# Patient Record
Sex: Male | Born: 1960
Health system: Southern US, Community
[De-identification: ages and names within clinical notes are randomized; demographics above are authoritative.]

## PROBLEM LIST (undated history)

## (undated) ENCOUNTER — Emergency Department: Discharge status: Absent without leave | Payer: Medicare Other

## (undated) DIAGNOSIS — Z789 Other specified health status: Secondary | ICD-10-CM

## (undated) DIAGNOSIS — M2550 Pain in unspecified joint: Secondary | ICD-10-CM

## (undated) DIAGNOSIS — I872 Venous insufficiency (chronic) (peripheral): Secondary | ICD-10-CM

## (undated) DIAGNOSIS — F1910 Other psychoactive substance abuse, uncomplicated: Secondary | ICD-10-CM

## (undated) DIAGNOSIS — F1020 Alcohol dependence, uncomplicated: Secondary | ICD-10-CM

## (undated) DIAGNOSIS — S065X9A Traumatic subdural hemorrhage with loss of consciousness of unspecified duration, initial encounter: Secondary | ICD-10-CM

## (undated) DIAGNOSIS — I10 Essential (primary) hypertension: Secondary | ICD-10-CM

## (undated) DIAGNOSIS — R519 Headache, unspecified: Secondary | ICD-10-CM

## (undated) DIAGNOSIS — G8929 Other chronic pain: Secondary | ICD-10-CM

## (undated) DIAGNOSIS — Z87442 Personal history of urinary calculi: Secondary | ICD-10-CM

## (undated) DIAGNOSIS — M1990 Unspecified osteoarthritis, unspecified site: Secondary | ICD-10-CM

## (undated) DIAGNOSIS — F32A Depression, unspecified: Secondary | ICD-10-CM

## (undated) DIAGNOSIS — R51 Headache: Secondary | ICD-10-CM

## (undated) DIAGNOSIS — G8110 Spastic hemiplegia affecting unspecified side: Secondary | ICD-10-CM

## (undated) DIAGNOSIS — I639 Cerebral infarction, unspecified: Secondary | ICD-10-CM

## (undated) DIAGNOSIS — G4700 Insomnia, unspecified: Secondary | ICD-10-CM

## (undated) DIAGNOSIS — J939 Pneumothorax, unspecified: Secondary | ICD-10-CM

## (undated) DIAGNOSIS — F419 Anxiety disorder, unspecified: Secondary | ICD-10-CM

## (undated) DIAGNOSIS — J449 Chronic obstructive pulmonary disease, unspecified: Secondary | ICD-10-CM

## (undated) DIAGNOSIS — F329 Major depressive disorder, single episode, unspecified: Secondary | ICD-10-CM

## (undated) HISTORY — DX: Spastic hemiplegia affecting unspecified side: G81.10

## (undated) HISTORY — DX: Depression, unspecified: F32.A

## (undated) HISTORY — PX: VIDEO ASSISTED THORACOSCOPY (VATS) W/TALC PLEUADESIS: SHX6168

## (undated) HISTORY — DX: Chronic obstructive pulmonary disease, unspecified: J44.9

## (undated) HISTORY — DX: Headache, unspecified: R51.9

## (undated) HISTORY — DX: Traumatic subdural hemorrhage with loss of consciousness of unspecified duration, initial encounter: S06.5X9A

## (undated) HISTORY — PX: TYMPANOPLASTY: SHX33

## (undated) HISTORY — DX: Venous insufficiency (chronic) (peripheral): I87.2

## (undated) HISTORY — DX: Alcohol dependence, uncomplicated: F10.20

## (undated) HISTORY — PX: CHEST TUBE INSERTION: SHX231

## (undated) HISTORY — DX: Headache: R51

## (undated) HISTORY — PX: TONSILLECTOMY: SUR1361

## (undated) HISTORY — DX: Personal history of urinary calculi: Z87.442

## (undated) HISTORY — DX: Anxiety disorder, unspecified: F41.9

## (undated) HISTORY — PX: LUNG SURGERY: SHX703

## (undated) HISTORY — PX: SHOULDER OPEN ROTATOR CUFF REPAIR: SHX2407

## (undated) HISTORY — DX: Major depressive disorder, single episode, unspecified: F32.9

## (undated) HISTORY — DX: Insomnia, unspecified: G47.00

## (undated) HISTORY — DX: Other specified health status: Z78.9

---

## 2006-02-03 ENCOUNTER — Ambulatory Visit: Payer: Self-pay | Admitting: Family Medicine

## 2006-03-21 ENCOUNTER — Ambulatory Visit (HOSPITAL_COMMUNITY): Admission: RE | Admit: 2006-03-21 | Discharge: 2006-03-21 | Payer: Self-pay | Admitting: Neurosurgery

## 2006-07-21 ENCOUNTER — Ambulatory Visit: Payer: Self-pay | Admitting: Physician Assistant

## 2006-08-29 ENCOUNTER — Ambulatory Visit: Payer: Self-pay | Admitting: Unknown Physician Specialty

## 2007-04-29 ENCOUNTER — Ambulatory Visit: Payer: Self-pay | Admitting: Unknown Physician Specialty

## 2007-05-21 ENCOUNTER — Ambulatory Visit: Payer: Self-pay | Admitting: Unknown Physician Specialty

## 2007-07-04 ENCOUNTER — Emergency Department: Payer: Self-pay | Admitting: Emergency Medicine

## 2007-07-09 ENCOUNTER — Ambulatory Visit: Payer: Self-pay | Admitting: Urology

## 2007-07-15 ENCOUNTER — Ambulatory Visit: Payer: Self-pay | Admitting: Urology

## 2007-07-16 ENCOUNTER — Ambulatory Visit: Payer: Self-pay | Admitting: Urology

## 2007-07-28 ENCOUNTER — Ambulatory Visit: Payer: Self-pay | Admitting: Urology

## 2007-07-29 ENCOUNTER — Ambulatory Visit: Payer: Self-pay | Admitting: Urology

## 2008-10-14 ENCOUNTER — Ambulatory Visit: Payer: Self-pay | Admitting: Internal Medicine

## 2008-10-14 ENCOUNTER — Ambulatory Visit: Payer: Self-pay | Admitting: Pulmonary Disease

## 2008-10-14 ENCOUNTER — Inpatient Hospital Stay (HOSPITAL_COMMUNITY): Admission: EM | Admit: 2008-10-14 | Discharge: 2008-10-18 | Payer: Self-pay | Admitting: Emergency Medicine

## 2008-10-14 ENCOUNTER — Ambulatory Visit: Payer: Self-pay | Admitting: Cardiothoracic Surgery

## 2008-10-15 ENCOUNTER — Encounter: Payer: Self-pay | Admitting: Pulmonary Disease

## 2008-10-19 ENCOUNTER — Ambulatory Visit: Payer: Self-pay | Admitting: Pulmonary Disease

## 2008-10-19 DIAGNOSIS — J930 Spontaneous tension pneumothorax: Secondary | ICD-10-CM

## 2008-10-19 DIAGNOSIS — J939 Pneumothorax, unspecified: Secondary | ICD-10-CM | POA: Insufficient documentation

## 2008-11-08 ENCOUNTER — Ambulatory Visit: Payer: Self-pay | Admitting: Pulmonary Disease

## 2008-11-08 DIAGNOSIS — F17200 Nicotine dependence, unspecified, uncomplicated: Secondary | ICD-10-CM | POA: Insufficient documentation

## 2008-11-08 DIAGNOSIS — J449 Chronic obstructive pulmonary disease, unspecified: Secondary | ICD-10-CM

## 2008-11-17 ENCOUNTER — Ambulatory Visit: Payer: Self-pay | Admitting: Pain Medicine

## 2008-11-20 ENCOUNTER — Ambulatory Visit: Payer: Self-pay | Admitting: Cardiothoracic Surgery

## 2008-11-21 ENCOUNTER — Inpatient Hospital Stay (HOSPITAL_COMMUNITY): Admission: EM | Admit: 2008-11-21 | Discharge: 2008-11-25 | Payer: Self-pay | Admitting: Emergency Medicine

## 2008-11-21 ENCOUNTER — Ambulatory Visit: Payer: Self-pay | Admitting: Pulmonary Disease

## 2008-11-22 ENCOUNTER — Encounter: Payer: Self-pay | Admitting: Cardiothoracic Surgery

## 2008-12-09 ENCOUNTER — Ambulatory Visit: Payer: Self-pay | Admitting: Cardiothoracic Surgery

## 2008-12-09 ENCOUNTER — Encounter: Admission: RE | Admit: 2008-12-09 | Discharge: 2008-12-09 | Payer: Self-pay | Admitting: Cardiothoracic Surgery

## 2008-12-19 ENCOUNTER — Encounter: Admission: RE | Admit: 2008-12-19 | Discharge: 2008-12-19 | Payer: Self-pay | Admitting: Cardiothoracic Surgery

## 2008-12-19 ENCOUNTER — Ambulatory Visit: Payer: Self-pay | Admitting: Cardiothoracic Surgery

## 2009-03-24 ENCOUNTER — Ambulatory Visit: Payer: Self-pay | Admitting: Unknown Physician Specialty

## 2009-03-28 ENCOUNTER — Ambulatory Visit: Payer: Self-pay | Admitting: Unknown Physician Specialty

## 2009-05-19 IMAGING — CR DG CHEST 2V
2 series · 2 of 2 positions shown · non-contrast
Comparison: 10/19/2008

CLINICAL DATA: , follow up pneumothorax

CHEST - 2 VIEW

[view not recorded (1 of 2)]
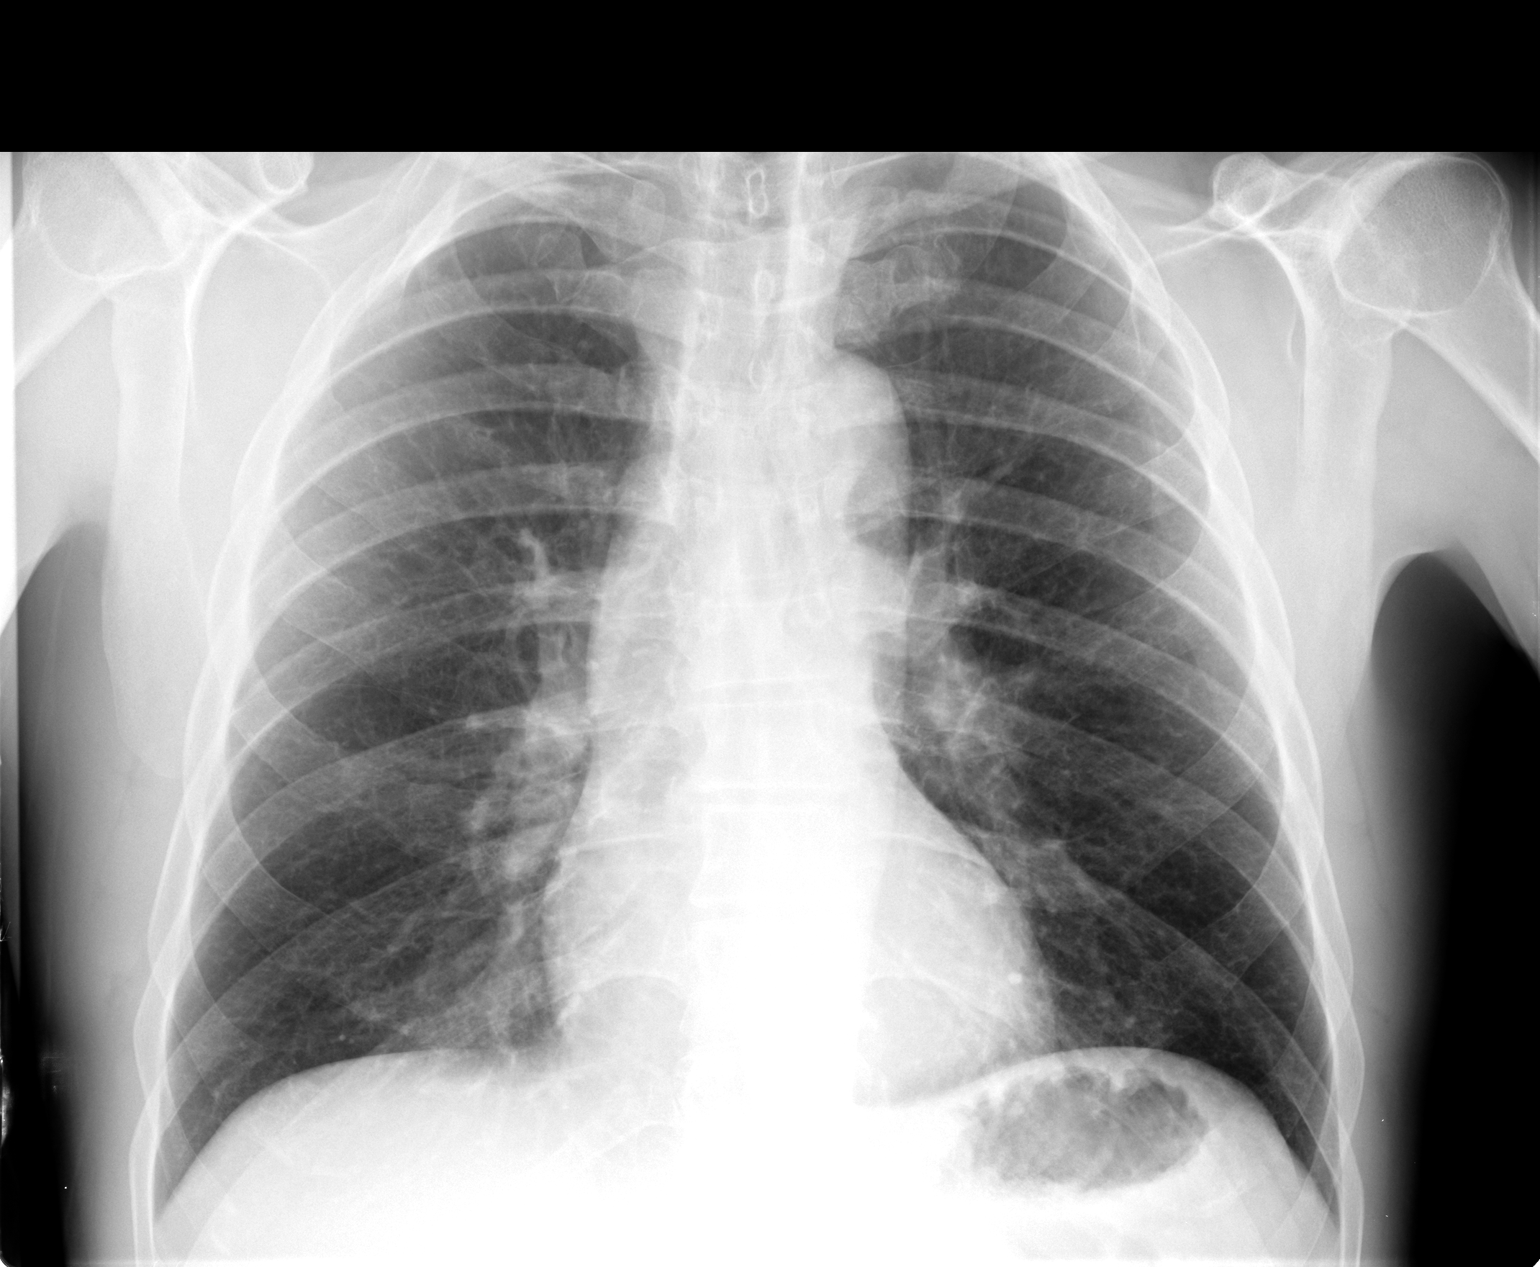

[view not recorded (2 of 2)]
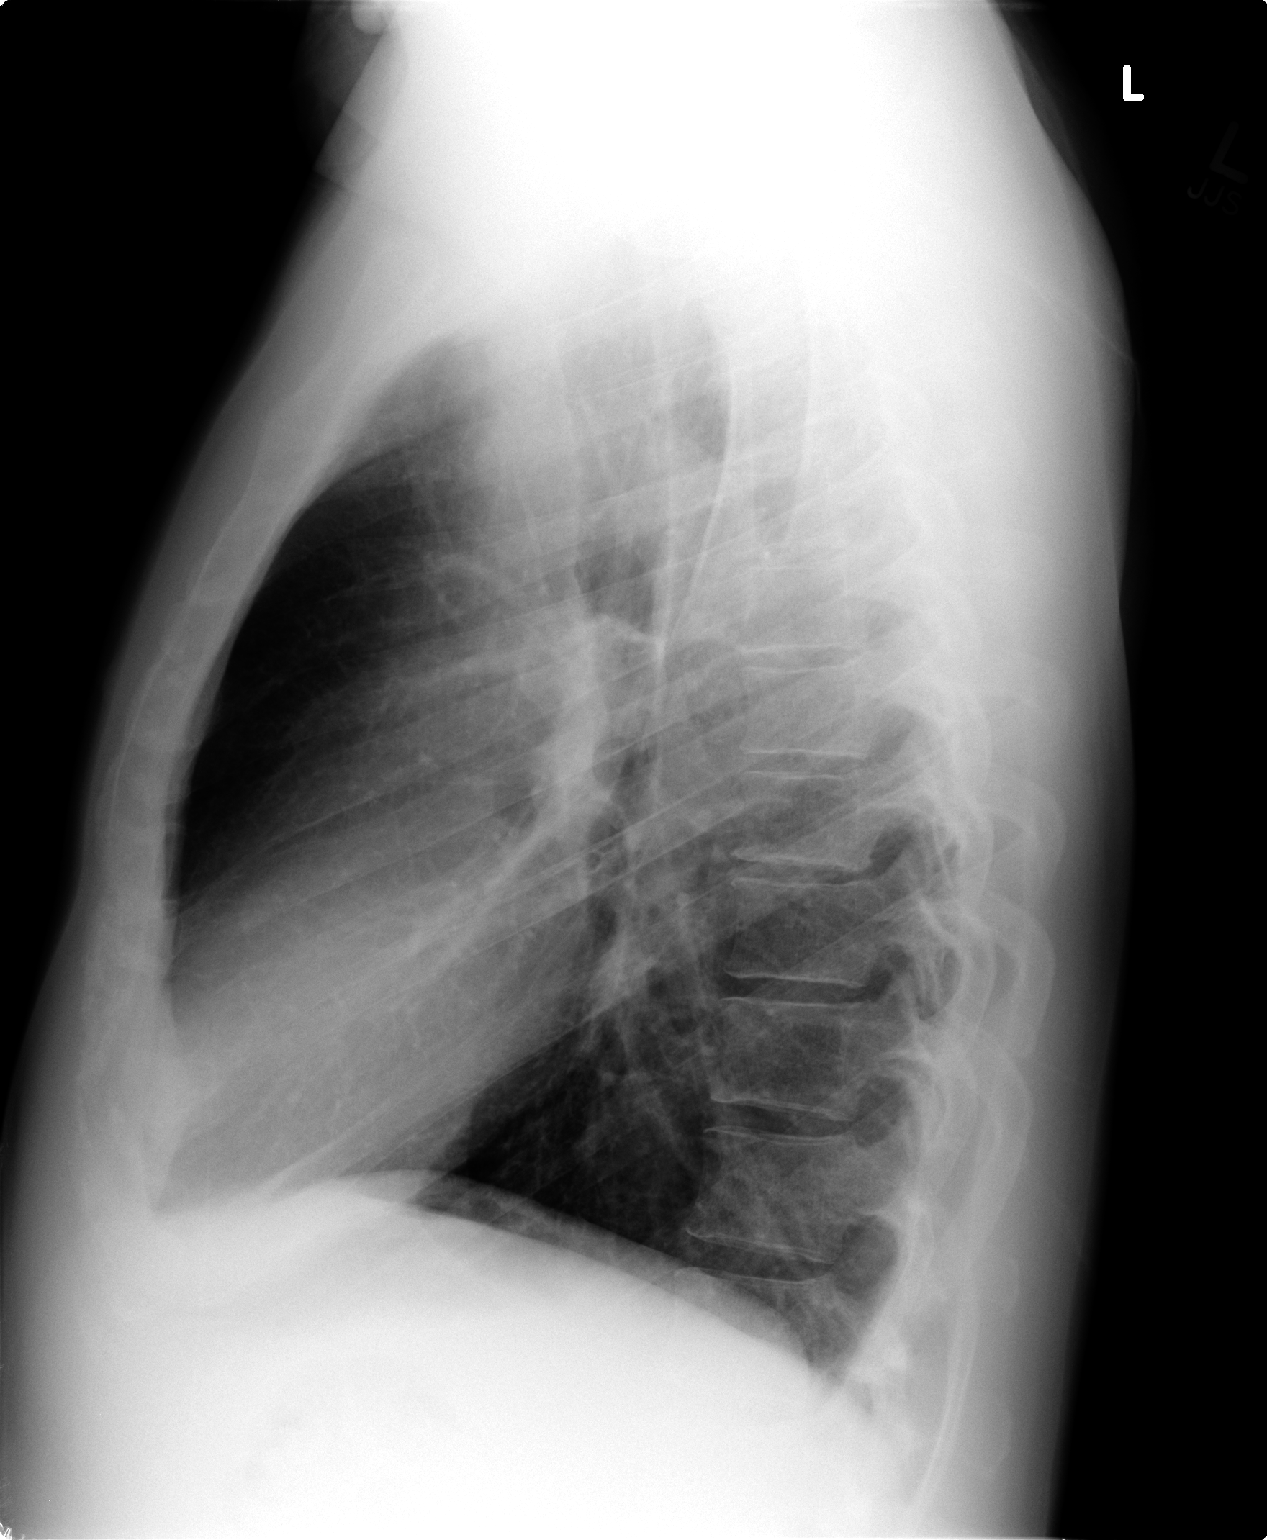

[2 of 2 positions shown; findings below may reference images not displayed]

FINDINGS: Heart size is normal.

There is no pleural effusion or pulmonary edema.

Interstitial change consistent with COPD is noted.

No pneumothorax is identified.
IMPRESSION: 1.  No evidence for pneumothorax.
2.  COPD.

## 2009-05-31 IMAGING — CR DG CHEST 1V PORT
1 series · 1 of 1 positions shown · non-contrast
Comparison: 11/20/2008.

CLINICAL DATA: Chest tube placement.

PORTABLE CHEST - 1 VIEW

[view not recorded]
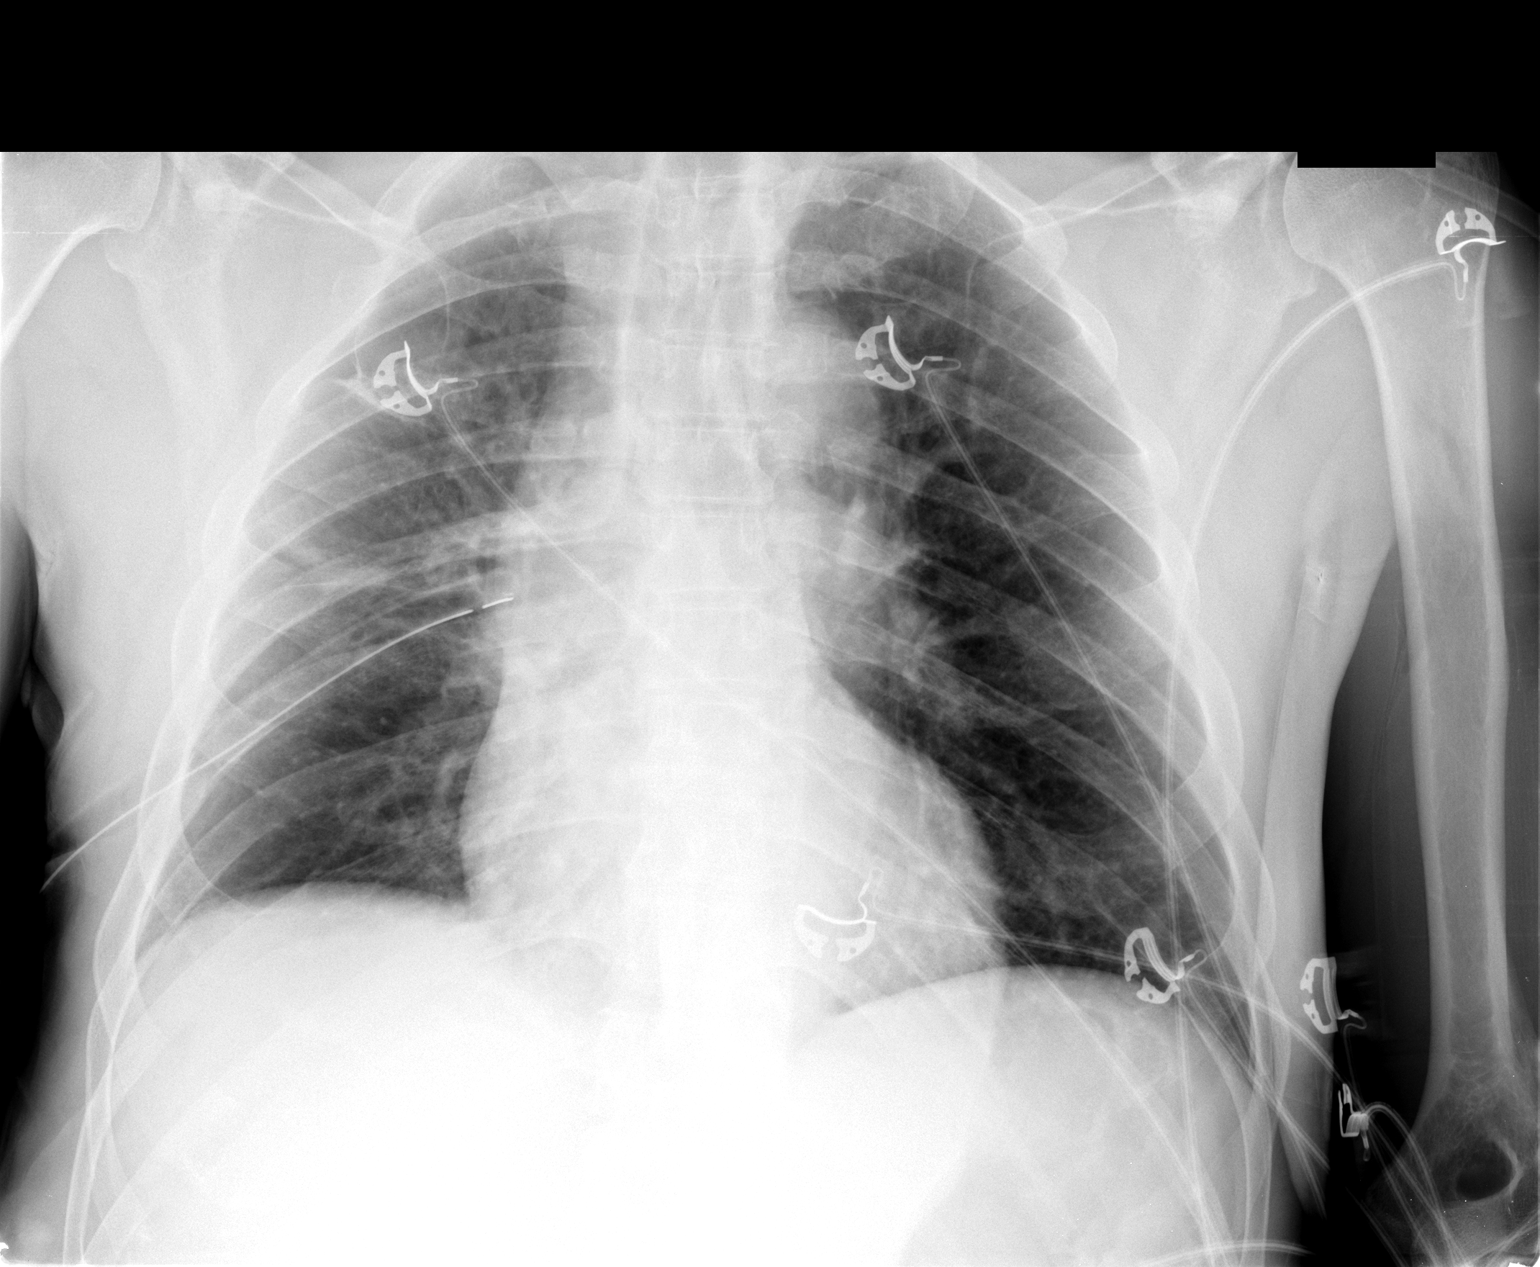

[1 of 1 positions shown; findings below may reference images not displayed]

FINDINGS: Right chest tube has been placed with re-expansion of the
right lung.  There is a small residual right apical pneumothorax.
There is no pleural effusion.  There is mild right lower lobe
atelectasis.  The left lung is clear.
IMPRESSION: Satisfactory right chest tube placement with small residual
pneumothorax.

## 2009-06-06 ENCOUNTER — Ambulatory Visit: Payer: Self-pay | Admitting: Cardiothoracic Surgery

## 2009-06-29 IMAGING — CR DG CHEST 2V
2 series · 2 of 2 positions shown · non-contrast
Comparison: Chest x-ray of 12/09/2008

CLINICAL DATA: Spontaneous pneumothorax, follow-up

CHEST - 2 VIEW

[w chest pa]
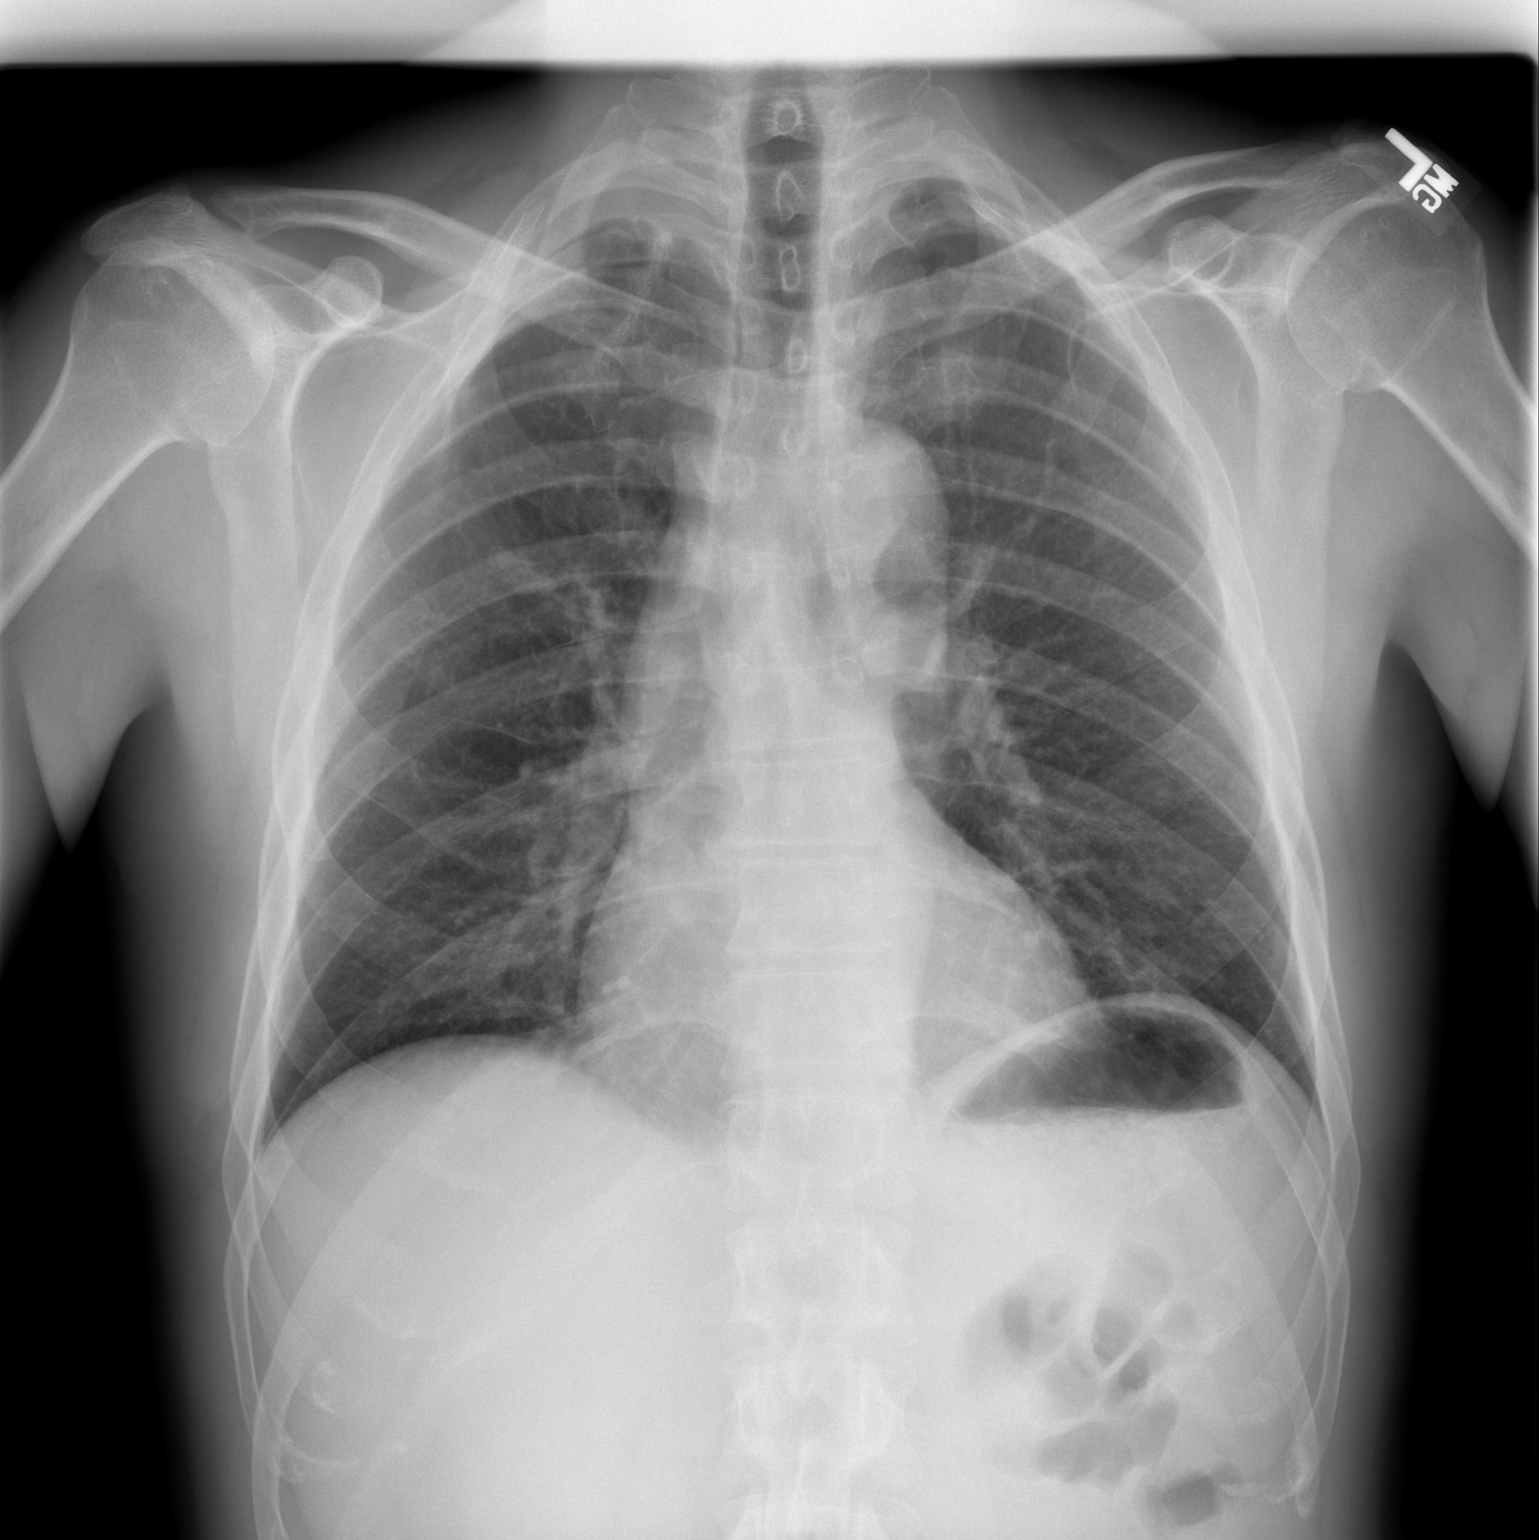

[w chest lat]
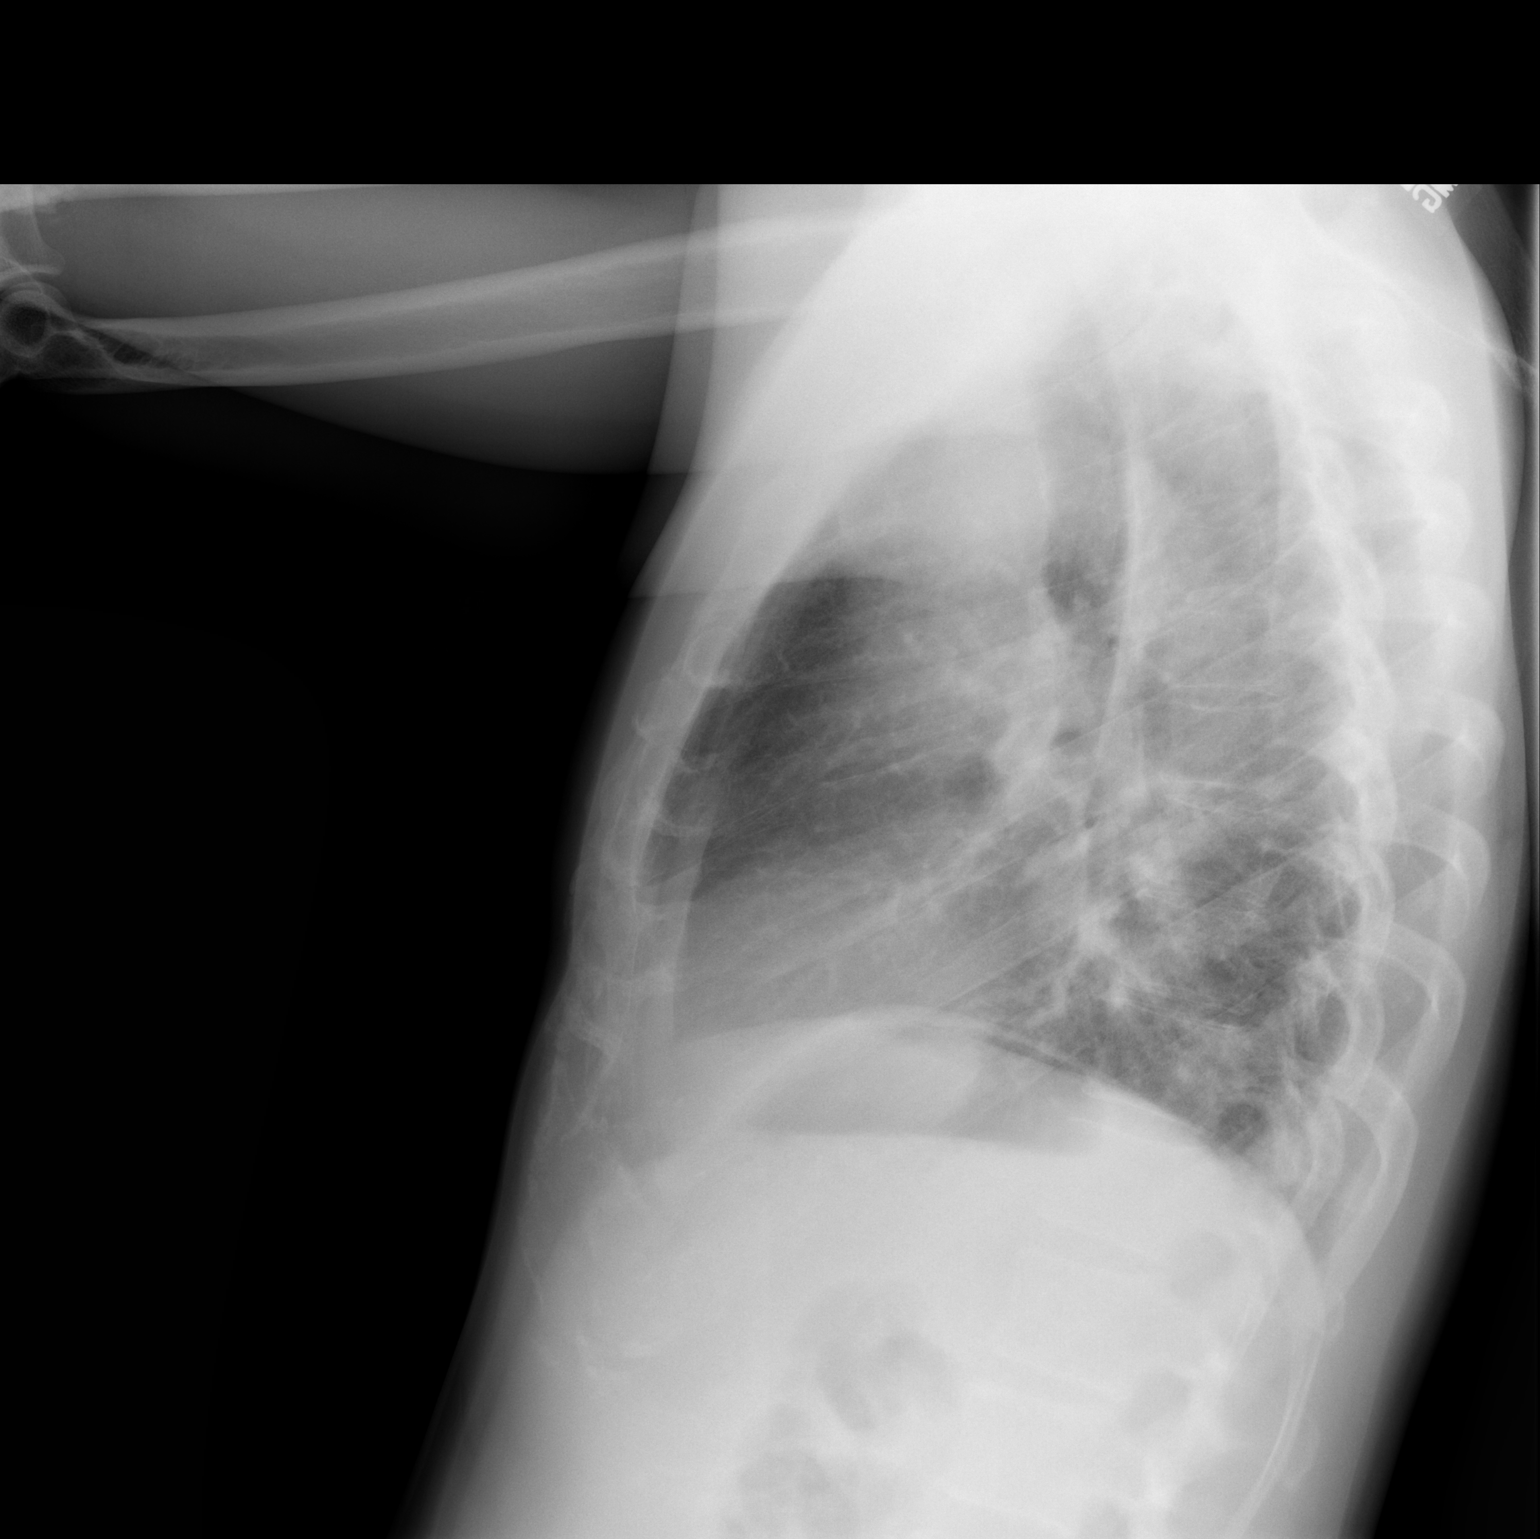

[2 of 2 positions shown; findings below may reference images not displayed]

FINDINGS: Sutures are noted in the right apex where there is right
apical pleural thickening present as well.  No pneumothorax is
seen.  No effusion is noted.  Heart is within normal limits in
size.  No bony abnormality is seen.
IMPRESSION: Stable postop change in the right lung apex.  No pneumothorax.

## 2010-06-20 ENCOUNTER — Observation Stay: Payer: Self-pay | Admitting: Specialist

## 2010-08-11 ENCOUNTER — Observation Stay: Payer: Self-pay | Admitting: Internal Medicine

## 2010-08-13 ENCOUNTER — Ambulatory Visit: Payer: Self-pay | Admitting: Internal Medicine

## 2010-10-28 DIAGNOSIS — I639 Cerebral infarction, unspecified: Secondary | ICD-10-CM

## 2010-10-28 HISTORY — DX: Cerebral infarction, unspecified: I63.9

## 2010-11-02 ENCOUNTER — Inpatient Hospital Stay (HOSPITAL_COMMUNITY)
Admission: EM | Admit: 2010-11-02 | Discharge: 2010-11-13 | Disposition: A | Discharge status: Rehabilitation Facility | Payer: Self-pay | Source: Home / Self Care | Attending: Internal Medicine | Admitting: Internal Medicine

## 2010-11-02 ENCOUNTER — Encounter (INDEPENDENT_AMBULATORY_CARE_PROVIDER_SITE_OTHER): Payer: Self-pay | Admitting: Internal Medicine

## 2010-11-03 ENCOUNTER — Encounter (INDEPENDENT_AMBULATORY_CARE_PROVIDER_SITE_OTHER): Payer: Self-pay | Admitting: Cardiology

## 2010-11-03 ENCOUNTER — Encounter (INDEPENDENT_AMBULATORY_CARE_PROVIDER_SITE_OTHER): Payer: Self-pay | Admitting: Neurology

## 2010-11-05 DIAGNOSIS — F05 Delirium due to known physiological condition: Secondary | ICD-10-CM

## 2010-11-07 ENCOUNTER — Encounter (INDEPENDENT_AMBULATORY_CARE_PROVIDER_SITE_OTHER): Payer: Self-pay | Admitting: Cardiology

## 2010-11-08 ENCOUNTER — Encounter (INDEPENDENT_AMBULATORY_CARE_PROVIDER_SITE_OTHER): Payer: Self-pay | Admitting: Neurology

## 2010-11-12 LAB — BASIC METABOLIC PANEL
BUN: 15 mg/dL (ref 6–23)
BUN: 11 mg/dL (ref 6–23)
BUN: 7 mg/dL (ref 6–23)
BUN: 11 mg/dL (ref 6–23)
CO2: 22 mEq/L (ref 19–32)
CO2: 19 mEq/L (ref 19–32)
CO2: 21 mEq/L (ref 19–32)
CO2: 24 mEq/L (ref 19–32)
Calcium: 8.7 mg/dL (ref 8.4–10.5)
Calcium: 8.9 mg/dL (ref 8.4–10.5)
Calcium: 9 mg/dL (ref 8.4–10.5)
Calcium: 9.7 mg/dL (ref 8.4–10.5)
Chloride: 108 mEq/L (ref 96–112)
Chloride: 105 mEq/L (ref 96–112)
Chloride: 104 mEq/L (ref 96–112)
Chloride: 105 mEq/L (ref 96–112)
Creatinine, Ser: 1.42 mg/dL (ref 0.4–1.5)
Creatinine, Ser: 1.14 mg/dL (ref 0.4–1.5)
Creatinine, Ser: 0.74 mg/dL (ref 0.4–1.5)
Creatinine, Ser: 0.85 mg/dL (ref 0.4–1.5)
GFR calc Af Amer: >60 mL/min (ref >60)
GFR calc Af Amer: >60 mL/min (ref >60)
GFR calc Af Amer: >60 mL/min (ref >60)
GFR calc Af Amer: >60 mL/min (ref >60)
GFR calc non Af Amer: 53 mL/min — ABNORMAL LOW (ref >60)
GFR calc non Af Amer: >60 mL/min (ref >60)
GFR calc non Af Amer: >60 mL/min (ref >60)
GFR calc non Af Amer: >60 mL/min (ref >60)
Glucose, Bld: 126 mg/dL — ABNORMAL HIGH (ref 70–99)
Glucose, Bld: 137 mg/dL — ABNORMAL HIGH (ref 70–99)
Glucose, Bld: 109 mg/dL — ABNORMAL HIGH (ref 70–99)
Glucose, Bld: 109 mg/dL — ABNORMAL HIGH (ref 70–99)
Potassium: 3.4 mEq/L — ABNORMAL LOW (ref 3.5–5.1)
Potassium: 3.6 mEq/L (ref 3.5–5.1)
Potassium: 3.8 mEq/L (ref 3.5–5.1)
Potassium: 3.9 mEq/L (ref 3.5–5.1)
Sodium: 135 mEq/L (ref 135–145)
Sodium: 136 mEq/L (ref 135–145)
Sodium: 135 mEq/L (ref 135–145)
Sodium: 138 mEq/L (ref 135–145)

## 2010-11-12 LAB — GLUCOSE, CAPILLARY
Glucose-Capillary: 108 mg/dL — ABNORMAL HIGH (ref 70–99)
Glucose-Capillary: 136 mg/dL — ABNORMAL HIGH (ref 70–99)
Glucose-Capillary: 109 mg/dL — ABNORMAL HIGH (ref 70–99)
Glucose-Capillary: 132 mg/dL — ABNORMAL HIGH (ref 70–99)
Glucose-Capillary: 167 mg/dL — ABNORMAL HIGH (ref 70–99)
Glucose-Capillary: 119 mg/dL — ABNORMAL HIGH (ref 70–99)
Glucose-Capillary: 121 mg/dL — ABNORMAL HIGH (ref 70–99)
Glucose-Capillary: 122 mg/dL — ABNORMAL HIGH (ref 70–99)
Glucose-Capillary: 133 mg/dL — ABNORMAL HIGH (ref 70–99)
Glucose-Capillary: 149 mg/dL — ABNORMAL HIGH (ref 70–99)
Glucose-Capillary: 161 mg/dL — ABNORMAL HIGH (ref 70–99)
Glucose-Capillary: 111 mg/dL — ABNORMAL HIGH (ref 70–99)
Glucose-Capillary: 108 mg/dL — ABNORMAL HIGH (ref 70–99)
Glucose-Capillary: 111 mg/dL — ABNORMAL HIGH (ref 70–99)
Glucose-Capillary: 114 mg/dL — ABNORMAL HIGH (ref 70–99)
Glucose-Capillary: 117 mg/dL — ABNORMAL HIGH (ref 70–99)
Glucose-Capillary: 119 mg/dL — ABNORMAL HIGH (ref 70–99)
Glucose-Capillary: 120 mg/dL — ABNORMAL HIGH (ref 70–99)
Glucose-Capillary: 164 mg/dL — ABNORMAL HIGH (ref 70–99)
Glucose-Capillary: 98 mg/dL (ref 70–99)
Glucose-Capillary: 109 mg/dL — ABNORMAL HIGH (ref 70–99)
Glucose-Capillary: 122 mg/dL — ABNORMAL HIGH (ref 70–99)
Glucose-Capillary: 97 mg/dL (ref 70–99)

## 2010-11-12 LAB — AMMONIA
Ammonia: 38 umol/L — ABNORMAL HIGH (ref 11–35)
Ammonia: 23 umol/L (ref 11–35)
Ammonia: 27 umol/L (ref 11–35)

## 2010-11-12 LAB — SODIUM, URINE, RANDOM: Sodium, Ur: 91 mEq/L

## 2010-11-12 LAB — COMPREHENSIVE METABOLIC PANEL
ALT: 29 U/L (ref 0–53)
ALT: 27 U/L (ref 0–53)
AST: 65 U/L — ABNORMAL HIGH (ref 0–37)
AST: 60 U/L — ABNORMAL HIGH (ref 0–37)
Albumin: 4.1 g/dL (ref 3.5–5.2)
Albumin: 3.8 g/dL (ref 3.5–5.2)
Alkaline Phosphatase: 112 U/L (ref 39–117)
Alkaline Phosphatase: 86 U/L (ref 39–117)
BUN: 30 mg/dL — ABNORMAL HIGH (ref 6–23)
BUN: 28 mg/dL — ABNORMAL HIGH (ref 6–23)
CO2: 21 mEq/L (ref 19–32)
CO2: 21 mEq/L (ref 19–32)
Calcium: 9.7 mg/dL (ref 8.4–10.5)
Calcium: 8.7 mg/dL (ref 8.4–10.5)
Chloride: 105 mEq/L (ref 96–112)
Chloride: 107 mEq/L (ref 96–112)
Creatinine, Ser: 4.11 mg/dL — ABNORMAL HIGH (ref 0.4–1.5)
Creatinine, Ser: 2.84 mg/dL — ABNORMAL HIGH (ref 0.4–1.5)
GFR calc Af Amer: 19 mL/min — ABNORMAL LOW (ref >60)
GFR calc Af Amer: 29 mL/min — ABNORMAL LOW (ref >60)
GFR calc non Af Amer: 15 mL/min — ABNORMAL LOW (ref >60)
GFR calc non Af Amer: 24 mL/min — ABNORMAL LOW (ref >60)
Glucose, Bld: 114 mg/dL — ABNORMAL HIGH (ref 70–99)
Glucose, Bld: 99 mg/dL (ref 70–99)
Potassium: 3.9 mEq/L (ref 3.5–5.1)
Potassium: 3.6 mEq/L (ref 3.5–5.1)
Sodium: 140 mEq/L (ref 135–145)
Sodium: 138 mEq/L (ref 135–145)
Total Bilirubin: 0.6 mg/dL (ref 0.3–1.2)
Total Bilirubin: 0.3 mg/dL (ref 0.3–1.2)
Total Protein: 8.2 g/dL (ref 6.0–8.3)
Total Protein: 7.2 g/dL (ref 6.0–8.3)

## 2010-11-12 LAB — DIFFERENTIAL
Basophils Absolute: 0 10*3/uL (ref 0.0–0.1)
Basophils Absolute: 0 10*3/uL (ref 0.0–0.1)
Basophils Relative: 0 % (ref 0–1)
Basophils Relative: 1 % (ref 0–1)
Eosinophils Absolute: 0 10*3/uL (ref 0.0–0.7)
Eosinophils Absolute: 0.3 10*3/uL (ref 0.0–0.7)
Eosinophils Relative: 0 % (ref 0–5)
Eosinophils Relative: 4 % (ref 0–5)
Lymphocytes Relative: 5 % — ABNORMAL LOW (ref 12–46)
Lymphocytes Relative: 16 % (ref 12–46)
Lymphs Abs: 0.9 10*3/uL (ref 0.7–4.0)
Lymphs Abs: 1.3 10*3/uL (ref 0.7–4.0)
Monocytes Absolute: 1 10*3/uL (ref 0.1–1.0)
Monocytes Absolute: 0.6 10*3/uL (ref 0.1–1.0)
Monocytes Relative: 6 % (ref 3–12)
Monocytes Relative: 7 % (ref 3–12)
Neutro Abs: 16.1 10*3/uL — ABNORMAL HIGH (ref 1.7–7.7)
Neutro Abs: 5.9 10*3/uL (ref 1.7–7.7)
Neutrophils Relative %: 89 % — ABNORMAL HIGH (ref 43–77)
Neutrophils Relative %: 72 % (ref 43–77)

## 2010-11-12 LAB — APTT
aPTT: 31 seconds (ref 24–37)
aPTT: 33 seconds (ref 24–37)

## 2010-11-12 LAB — CBC
HCT: 44.1 % (ref 39.0–52.0)
HCT: 40.7 % (ref 39.0–52.0)
HCT: 36 % — ABNORMAL LOW (ref 39.0–52.0)
HCT: 35.1 % — ABNORMAL LOW (ref 39.0–52.0)
HCT: 40.5 % (ref 39.0–52.0)
Hemoglobin: 15.1 g/dL (ref 13.0–17.0)
Hemoglobin: 13.8 g/dL (ref 13.0–17.0)
Hemoglobin: 12.2 g/dL — ABNORMAL LOW (ref 13.0–17.0)
Hemoglobin: 12.1 g/dL — ABNORMAL LOW (ref 13.0–17.0)
Hemoglobin: 13.7 g/dL (ref 13.0–17.0)
MCH: 31.5 pg (ref 26.0–34.0)
MCH: 30.9 pg (ref 26.0–34.0)
MCH: 30.2 pg (ref 26.0–34.0)
MCH: 31 pg (ref 26.0–34.0)
MCH: 30.7 pg (ref 26.0–34.0)
MCHC: 34.2 g/dL (ref 30.0–36.0)
MCHC: 33.9 g/dL (ref 30.0–36.0)
MCHC: 33.9 g/dL (ref 30.0–36.0)
MCHC: 34.5 g/dL (ref 30.0–36.0)
MCHC: 33.8 g/dL (ref 30.0–36.0)
MCV: 91.9 fL (ref 78.0–100.0)
MCV: 91.1 fL (ref 78.0–100.0)
MCV: 89.1 fL (ref 78.0–100.0)
MCV: 90 fL (ref 78.0–100.0)
MCV: 90.8 fL (ref 78.0–100.0)
Platelets: 283 10*3/uL (ref 150–400)
Platelets: 272 10*3/uL (ref 150–400)
Platelets: 264 10*3/uL (ref 150–400)
Platelets: 279 10*3/uL (ref 150–400)
Platelets: 389 10*3/uL (ref 150–400)
RBC: 4.8 MIL/uL (ref 4.22–5.81)
RBC: 4.47 MIL/uL (ref 4.22–5.81)
RBC: 4.04 MIL/uL — ABNORMAL LOW (ref 4.22–5.81)
RBC: 3.9 MIL/uL — ABNORMAL LOW (ref 4.22–5.81)
RBC: 4.46 MIL/uL (ref 4.22–5.81)
RDW: 13.9 % (ref 11.5–15.5)
RDW: 13.7 % (ref 11.5–15.5)
RDW: 13.3 % (ref 11.5–15.5)
RDW: 13.5 % (ref 11.5–15.5)
RDW: 13.5 % (ref 11.5–15.5)
WBC: 18.1 10*3/uL — ABNORMAL HIGH (ref 4.0–10.5)
WBC: 13.7 10*3/uL — ABNORMAL HIGH (ref 4.0–10.5)
WBC: 12.1 10*3/uL — ABNORMAL HIGH (ref 4.0–10.5)
WBC: 8.1 10*3/uL (ref 4.0–10.5)
WBC: 9.9 10*3/uL (ref 4.0–10.5)

## 2010-11-12 LAB — CARDIAC PANEL(CRET KIN+CKTOT+MB+TROPI)
CK, MB: 115.9 ng/mL (ref 0.3–4.0)
CK, MB: 28.8 ng/mL (ref 0.3–4.0)
CK, MB: 50.8 ng/mL (ref 0.3–4.0)
Relative Index: 1.9 (ref 0.0–2.5)
Relative Index: 3.1 — ABNORMAL HIGH (ref 0.0–2.5)
Relative Index: 4.9 — ABNORMAL HIGH (ref 0.0–2.5)
Total CK: 1544 U/L — ABNORMAL HIGH (ref 7–232)
Total CK: 1627 U/L — ABNORMAL HIGH (ref 7–232)
Total CK: 2345 U/L — ABNORMAL HIGH (ref 7–232)
Troponin I: 0.02 ng/mL (ref 0.00–0.06)
Troponin I: 0.03 ng/mL (ref 0.00–0.06)
Troponin I: 0.03 ng/mL (ref 0.00–0.06)

## 2010-11-12 LAB — RENAL FUNCTION PANEL
Albumin: 3.6 g/dL (ref 3.5–5.2)
BUN: 10 mg/dL (ref 6–23)
CO2: 20 mEq/L (ref 19–32)
Calcium: 9.3 mg/dL (ref 8.4–10.5)
Chloride: 107 mEq/L (ref 96–112)
Creatinine, Ser: 1.17 mg/dL (ref 0.4–1.5)
GFR calc Af Amer: >60 mL/min (ref >60)
GFR calc non Af Amer: >60 mL/min (ref >60)
Glucose, Bld: 125 mg/dL — ABNORMAL HIGH (ref 70–99)
Phosphorus: 2.4 mg/dL (ref 2.3–4.6)
Potassium: 3.6 mEq/L (ref 3.5–5.1)
Sodium: 138 mEq/L (ref 135–145)

## 2010-11-12 LAB — URINALYSIS, ROUTINE W REFLEX MICROSCOPIC
Bilirubin Urine: NEGATIVE
Ketones, ur: NEGATIVE mg/dL
Leukocytes, UA: NEGATIVE
Nitrite: NEGATIVE
Protein, ur: 100 mg/dL — AB
Specific Gravity, Urine: 1.017 (ref 1.005–1.030)
Urine Glucose, Fasting: NEGATIVE mg/dL
Urobilinogen, UA: 0.2 mg/dL (ref 0.0–1.0)
pH: 6 (ref 5.0–8.0)

## 2010-11-12 LAB — MRSA PCR SCREENING
MRSA by PCR: NEGATIVE
MRSA by PCR: NEGATIVE

## 2010-11-12 LAB — URINE CULTURE
Colony Count: 9000
Culture  Setup Time: 201201062205

## 2010-11-12 LAB — HEPATIC FUNCTION PANEL
ALT: 20 U/L (ref 0–53)
AST: 26 U/L (ref 0–37)
Albumin: 3.3 g/dL — ABNORMAL LOW (ref 3.5–5.2)
Alkaline Phosphatase: 79 U/L (ref 39–117)
Bilirubin, Direct: 0.1 mg/dL (ref 0.0–0.3)
Indirect Bilirubin: 0.6 mg/dL (ref 0.3–0.9)
Total Bilirubin: 0.7 mg/dL (ref 0.3–1.2)
Total Protein: 6.4 g/dL (ref 6.0–8.3)

## 2010-11-12 LAB — TROPONIN I: Troponin I: 0.03 ng/mL (ref 0.00–0.06)

## 2010-11-12 LAB — CK TOTAL AND CKMB (NOT AT ARMC)
CK, MB: 182.5 ng/mL (ref 0.3–4.0)
CK, MB: 2 ng/mL (ref 0.3–4.0)
Relative Index: 6 — ABNORMAL HIGH (ref 0.0–2.5)
Relative Index: 1.4 (ref 0.0–2.5)
Total CK: 3035 U/L — ABNORMAL HIGH (ref 7–232)
Total CK: 141 U/L (ref 7–232)

## 2010-11-12 LAB — PROTIME-INR
INR: 1.01 (ref 0.00–1.49)
INR: 1.04 (ref 0.00–1.49)
INR: 0.92 (ref 0.00–1.49)
INR: 1.07 (ref 0.00–1.49)
INR: 1.33 (ref 0.00–1.49)
Prothrombin Time: 13.5 seconds (ref 11.6–15.2)
Prothrombin Time: 13.8 seconds (ref 11.6–15.2)
Prothrombin Time: 12.6 seconds (ref 11.6–15.2)
Prothrombin Time: 14.1 seconds (ref 11.6–15.2)
Prothrombin Time: 16.7 seconds — ABNORMAL HIGH (ref 11.6–15.2)

## 2010-11-12 LAB — C-REACTIVE PROTEIN
CRP: 5.9 mg/dL — ABNORMAL HIGH (ref <0.6)
CRP: 4.1 mg/dL — ABNORMAL HIGH (ref <0.6)

## 2010-11-12 LAB — CULTURE, BLOOD (ROUTINE X 2)
Culture  Setup Time: 201201070614
Culture  Setup Time: 201201070614
Culture: NO GROWTH
Culture: NO GROWTH

## 2010-11-12 LAB — LIPID PANEL
Cholesterol: 188 mg/dL (ref 0–200)
HDL: 44 mg/dL (ref >39)
LDL Cholesterol: 120 mg/dL — ABNORMAL HIGH (ref 0–99)
Total CHOL/HDL Ratio: 4.3 RATIO
Triglycerides: 119 mg/dL (ref <150)
VLDL: 24 mg/dL (ref 0–40)

## 2010-11-12 LAB — HEMOGLOBIN A1C
Hgb A1c MFr Bld: 5.8 % — ABNORMAL HIGH (ref <5.7)
Mean Plasma Glucose: 120 mg/dL — ABNORMAL HIGH (ref <117)

## 2010-11-12 LAB — RAPID URINE DRUG SCREEN, HOSP PERFORMED
Amphetamines: POSITIVE — AB
Barbiturates: NOT DETECTED
Benzodiazepines: NOT DETECTED
Cocaine: NOT DETECTED
Opiates: NOT DETECTED
Tetrahydrocannabinol: NOT DETECTED

## 2010-11-12 LAB — MAGNESIUM: Magnesium: 1.9 mg/dL (ref 1.5–2.5)

## 2010-11-12 LAB — URINE MICROSCOPIC-ADD ON

## 2010-11-12 LAB — CK: Total CK: 896 U/L — ABNORMAL HIGH (ref 7–232)

## 2010-11-12 LAB — PHOSPHORUS: Phosphorus: 2 mg/dL — ABNORMAL LOW (ref 2.3–4.6)

## 2010-11-12 LAB — SEDIMENTATION RATE
Sed Rate: 45 mm/hr — ABNORMAL HIGH (ref 0–16)
Sed Rate: 60 mm/hr — ABNORMAL HIGH (ref 0–16)

## 2010-11-12 LAB — CREATININE, URINE, RANDOM: Creatinine, Urine: 104.2 mg/dL

## 2010-11-13 ENCOUNTER — Inpatient Hospital Stay (HOSPITAL_COMMUNITY)
Admission: RE | Admit: 2010-11-13 | Discharge: 2010-11-26 | Payer: Self-pay | Attending: Physical Medicine & Rehabilitation | Admitting: Physical Medicine & Rehabilitation

## 2010-11-14 LAB — GLUCOSE, CAPILLARY
Glucose-Capillary: 117 mg/dL — ABNORMAL HIGH (ref 70–99)
Glucose-Capillary: 99 mg/dL (ref 70–99)
Glucose-Capillary: 121 mg/dL — ABNORMAL HIGH (ref 70–99)
Glucose-Capillary: 109 mg/dL — ABNORMAL HIGH (ref 70–99)
Glucose-Capillary: 107 mg/dL — ABNORMAL HIGH (ref 70–99)
Glucose-Capillary: 102 mg/dL — ABNORMAL HIGH (ref 70–99)
Glucose-Capillary: 110 mg/dL — ABNORMAL HIGH (ref 70–99)

## 2010-11-14 LAB — PROTIME-INR
INR: 1.52 — ABNORMAL HIGH (ref 0.00–1.49)
INR: 1.39 (ref 0.00–1.49)
Prothrombin Time: 18.5 seconds — ABNORMAL HIGH (ref 11.6–15.2)
Prothrombin Time: 17.3 seconds — ABNORMAL HIGH (ref 11.6–15.2)

## 2010-11-16 NOTE — H&P (Signed)
[name], [name]                  ACCOUNT NO.:  [id number]  MEDICAL RECORD NO.:  [id number]          PATIENT TYPE:  IPS  LOCATION:  [id number]                         FACILITY:  [facility]  PHYSICIAN:  Meredith Staggers, M.D.DATE OF BIRTH:  1961/07/07  DATE OF ADMISSION:  11/13/2010 DATE OF DISCHARGE:                             HISTORY & PHYSICAL   CHIEF COMPLAINTS:  Confusion and weakness.  PRIMARY CARE PHYSICIAN:  [place] Pink.  NEUROLOGY:  Pramod P. Leonie Man, MD  HISTORY OF PRESENT ILLNESS:  This is a 50 year old white male with ADHD and chronic pain syndrome.  He has had multiple surgeries and recurrent pneumothorax with a VATS on January 2010.  He is admitted on November 02, 2010, after being found down by his son with altered mental status.  16 fentanyl patches were found on hips and arms.  CT of the head showed right frontal lobe parenchymal hematoma, left occipital subarachnoid hemorrhage.  MRI of the brain showed multiple scattered infarcts on right MCA territory as well as left occipital lobe.  Neurosurgery and Neurology recommend conservative care.  CTA of the neck showed a 5 x 10 right common carotid artery clot.  Coumadin was initiated on November 08, 2010, x3 months.  No further outpatient followup including TCD and bubble study.  TEE showed no thrombus.  Psychiatry is seeing the patient for delirium and maintained him on Haldol p.r.n.  He has had a sitter through November 07, 2010, and this was stopped, and mentation has improved.  Pain has been managed on oxycodone IR 5 mg q.6 h. p.m. However, the patient states this is not controlling pain.  Rehab was asked to see the patient on November 07, 2010, and felt ultimately he could benefit from an inpatient stay and social supports were available.  REVIEW OF SYSTEMS:  Notable for the above.  The patient denies chronic pain in his bilateral hips, his shoulders in particular.  He reports no constipation or incontinence recently,  bowel and bladder.  He has been sleeping fairly well.  Full 12-point review is in the written H and P and other pertinent positives are above.  PAST MEDICAL HISTORY:  Positive for ADHD, kidney stones, multiple rotator cuff surgeries, recurrent pneumothorax, VATS in January 2010, history of opioid and amphetamine abuse per chart, history of tobacco use, and negative alcohol abuse.  FAMILY HISTORY:  Positive for CAD.  SOCIAL HISTORY:  The patient lives with his son and is on disability. He has one-level house.  Nephew from [place] will assist as well as the daughter who can help.  ALLERGIES:  None.  HOME MEDICATIONS:  Adderall and fentanyl patch 125 mcg  LABORATORY DATA:  Hemoglobin 13.7, white count 9.9, platelets 389. Sodium 138, potassium 3.9.  PHYSICAL EXAMINATION:  VITAL SIGNS:  Blood pressure 139/94, pulse 85, respiratory rate 18, temperature 97. GENERAL:  The patient is pleasant, alert, slightly flat in affect.  He is oriented to hospital and the reason he was here, but not the date except with cues.  Could not tell me the floor he was on without cuing. HEENT:  Pupils equal, round, react  to light.  Nose and throat exam is notable for missing teeth.  Otherwise, intact mucosa. NECK:  Supple with JVD or lymphadenopathy. CHEST:  Clear to auscultation without wheezes, rales, or rhonchi. HEART:  Regular rate and rhythm without murmur, rubs, or gallops. ABDOMEN:  Soft, nontender.  Bowel sounds are positive. SKIN:  Notable for multiple psoriasis, plaques throughout the arms and legs. NEUROLOGIC:  Cranial nerves II through XII are grossly intact.  Reflexes are 1+.  Sensation is intact, left to right.  No difference in pinprick or light touch on either side.  Judgment, orientation, memory, and mood were all abnormal, but more in realm of poor insight awareness and memory.  Strength is trace at the left deltoid and triceps, zero at the biceps and hand and wrist.  Left lower  extremity is grossly 2 to 2+ perhaps out of 5.  Right upper extremity is 5/5, right lower extremity is 4-5/5.  POSTADMISSION PHYSICIAN EVALUATION: 1. Functional deficit secondary to multi distribution infarct     involving the right common carotid artery clot. 2. The patient is admitted to receive collaborative interdisciplinary     care between the physiatrist, rehab nursing staff, and therapy     team. 3. The patient's level of medical complexity and substantial therapy     needs in context of that medical necessity cannot be provided at a     lesser intensity of care. 4. The patient has experienced substantial functional loss from his     baseline.  Premorbidly is independent.  Currently, he has been min     assist bed mobility and transfer, mod assist gait 45 feet with quad     cane, and min to max assist ADLs.  Judging by the patient's     diagnosis, physical exam, and functional history, he is potential     for a functional progress, which result in measurable gains while     inpatient rehab.  These gains will be of substantial and practical     use upon discharge to home in facilitating mobility and self-care.     Interim changes since my consult are detailed above. 5. The physiatrist will provide 24-hour management of medical needs as     well as oversight of therapy plans/treatment, provide guidance as     appropriate regarding interaction of the two.  Medical problem list     and plan are below. 6. A 24-hour rehab nursing team will assist in the management of the     patient's skin care needs as well as nutrition, bowel and bladder     function, safety, and integration of therapy concepts and     techniques. 7. PT will assess and treat for lower extremity strength,     neuromuscular education, adaptive techniques, equipment safety,     pain management skills with goals, supervision to modified     independent. 8. OT will assess and treat for upper extremity use, ADLs,  adaptive     techniques, functional mobility, neuromuscular education, with     goals modified independent to min/mod assist for lower body care. 9. Speech Language Pathology will assess and treat for cognitive and     language issues with goals modified independent supervision. Copy and social worker will assess and treat for     psychosocial issues and discharge planning. 11.Team conference will be held weekly to assess progress towards     goals and to determine barriers at discharge. 12.The patient has demonstrated sufficient  medical stability and     exercise capacity to tolerate at least 3 hours of therapy per day     at least 5 days per week. 13.Estimated length of stay is 2-1/2 weeks.  Prognosis good.  The     patient has fair motivation. 14.Anticoagulation with Coumadin.  No bridge of Lovenox.  Most recent     INR is 1.52. 15.Pain management with oxycodone IR 5 mg q.6 h. p.r.n.  The patient     is obviously not safe with stronger and long-acting agents, and we     discussed this today.  The patient's insight is partially there,     but overall limited at this point.  He claims that he does not have     a problem, and that he does not recall how things happened.  He     states he does do things in his sleep occasionally. 16.Mood:  Haldol and Adderall.  Follow for stability.  Normalize sleep     cycle, etc. 17.Safety:  Bed alarm and close observation. 18.Tobacco abuse:  NicoDerm patches. 19.Blood pressure control with clonidine patch.     Meredith Staggers, M.D.     ZTS/[id number]  D:  11/13/2010  T:  11/14/2010  Job:  [id number]  cc:   [place] Pink Pramod P. Leonie Man, MD  Electronically Signed by Alger Simons M.D. on 11/16/2010 10:06:26 AM

## 2010-11-18 ENCOUNTER — Encounter: Payer: Self-pay | Admitting: Cardiothoracic Surgery

## 2010-11-19 LAB — COMPREHENSIVE METABOLIC PANEL
ALT: 18 U/L (ref 0–53)
AST: 23 U/L (ref 0–37)
CO2: 24 mEq/L (ref 19–32)
Calcium: 9.6 mg/dL (ref 8.4–10.5)
Creatinine, Ser: 0.88 mg/dL (ref 0.4–1.5)
GFR calc Af Amer: >60 mL/min (ref >60)
GFR calc non Af Amer: >60 mL/min (ref >60)
Glucose, Bld: 101 mg/dL — ABNORMAL HIGH (ref 70–99)
Sodium: 135 mEq/L (ref 135–145)
Total Protein: 7.3 g/dL (ref 6.0–8.3)

## 2010-11-19 LAB — CBC
MCV: 91.2 fL (ref 78.0–100.0)
Platelets: 432 10*3/uL — ABNORMAL HIGH (ref 150–400)
RBC: 4.75 MIL/uL (ref 4.22–5.81)
RDW: 13.5 % (ref 11.5–15.5)
WBC: 7.5 10*3/uL (ref 4.0–10.5)

## 2010-11-19 LAB — PROTIME-INR
INR: 1.91 — ABNORMAL HIGH (ref 0.00–1.49)
Prothrombin Time: 19.4 seconds — ABNORMAL HIGH (ref 11.6–15.2)
Prothrombin Time: 22.2 seconds — ABNORMAL HIGH (ref 11.6–15.2)
Prothrombin Time: 22 seconds — ABNORMAL HIGH (ref 11.6–15.2)

## 2010-11-19 LAB — DIFFERENTIAL
Basophils Relative: 1 % (ref 0–1)
Eosinophils Absolute: 0.4 10*3/uL (ref 0.0–0.7)
Eosinophils Relative: 6 % — ABNORMAL HIGH (ref 0–5)
Neutrophils Relative %: 65 % (ref 43–77)

## 2010-11-20 LAB — PROTIME-INR: Prothrombin Time: 21.6 seconds — ABNORMAL HIGH (ref 11.6–15.2)

## 2010-11-21 LAB — PROTIME-INR
INR: 2 — ABNORMAL HIGH (ref 0.00–1.49)
INR: 2.2 — ABNORMAL HIGH (ref 0.00–1.49)
Prothrombin Time: 22.8 seconds — ABNORMAL HIGH (ref 11.6–15.2)

## 2010-11-22 LAB — PROTIME-INR
INR: 2.45 — ABNORMAL HIGH (ref 0.00–1.49)
Prothrombin Time: 26.7 seconds — ABNORMAL HIGH (ref 11.6–15.2)

## 2010-11-27 NOTE — Discharge Summary (Signed)
[name], [name]                  ACCOUNT NO.:  [id number]  MEDICAL RECORD NO.:  [id number]          PATIENT TYPE:  INP  LOCATION:  3028                         FACILITY:  [facility]  PHYSICIAN:  Cherene Altes, M.D.DATE OF BIRTH:  [birth year]  DATE OF ADMISSION:  11/02/2010 DATE OF DISCHARGE:  11/12/2010                              DISCHARGE SUMMARY   PRIMARY CARE PHYSICIAN:  Unassigned to the [place] area - patient reports that he is followed by [facility] in [place].  DISCHARGE DIAGNOSES: 1. Bilateral cerebral infarcts.     a.     Both hemorrhagic and non-hemorrhagic.     b.     Right frontal right parietal and left occipital in location.     c.     Felt to be embolic with hemorrhagic conversion.     d.     Coumadin for 3 months per Neurology with repeat follow-up      computed tomography of the head and neck at that time. 2. Right common carotid artery clot.     a.     Diagnosed via computed tomography angiogram of the neck.  b.      Coumadin therapy initiated.     b.     Coumadin therapy to continue for 3 months with follow-up at      [facility] and repeat computed tomography      angiogram of the head and neck. 3. Acute delirium.     a.     Multifactorial.     b.     History of narcotic abuse.     c.     Chronic amphetamine use. 4. Hypertension - controlled. 5. Dyslipidemia - recommendation to initiate statin in 7-10 days - not     initiated during hospital stay due to rhabdomyolysis. 6. Chronic pain with narcotic abuse.     a.     Bilateral rotator cuff surgeries related to traumatic injury      as source of chronic pain.     b.     At time of admission patient had 16 fentanyl patches on his      body.     c.     Psychiatry consult recommends low dose narcotic therapy only      as needed for severe pain. 7. Attention deficit disorder versus possible narcolepsy - on chronic     amphetamine therapy. 8. Remote history of  nephrolithiasis. 9. Tobacco abuse. 10.Status post right-sided video-assisted thoracoscopy with surgical     correction of apical blebs and pleural scarification January 2010.  DISCHARGE MEDICATIONS:  A complete list of the patient's discharge medications is available as per the discharge med manager portion of the patient's eChart computer system file.  It is recommended that consideration be given to initiating a statin in the outpatient setting in 7-10 days.  This was not initiated during the patient's hospital stay due to a mild myositis/rhabdomyolysis myelitis.  The patient did have a lipid profile obtained during his stay which revealed an LDL of 120.  CONSULTATIONS: 1. Neurology. 2. Psychiatry. 3. Neurosurgery. 4. Cardiology  for TEE - Rafael Hernandez.  PROCEDURES: 1. CT scan of the head without contrast November 02, 2010:  Right     frontal lobe parenchymal hematoma and left occipital lobe     subarachnoid hemorrhage.  Multifocal areas of low density in the     right frontal lobe, right parietal lobe, and left occipital which are nonspecific, but likely represent multifocal areas of infarct. 2. CT scan cervical spine November 02, 2010 - no acute cervical spine     findings.  Cervical degenerative disk disease. 3. Ultrasound of the abdomen November 03, 2010 - small right pleural     effusion.  Possible mild dilation of intrahepatic biliary ducts.     Not definitive.  Common bile duct is normal. 4. MRI of the brain November 04, 2010 - scattered areas of infarction     involving the right middle cerebral artery territory and left     occipital lobe.  Associated cortical hemorrhage within one of the     areas in the right frontal lobe and within the left occipital lobe.     Study is moderately degraded by patient motion.  Anterior sinus     disease including air fluid levels within the maxillary sinus     bilaterally. 5. CT angio of the head and neck November 05, 2010:  Five x 10 mm      filling defect in the right common carotid artery compatible with     acute thrombus.  This is associated with atherosclerotic plaque.     No significant carotid and vertebral artery stenosis.  No     significant intracranial stenosis.  Bilateral cerebral infarcts.     Right frontal and left occipital infarcts are hemorrhagic. 6. Transesophageal echocardiogram January 11, 0000000 - systolic function     normal.  Ejection fraction 60-65%.  Wall motion normal.  No     regional wall motion abnormalities.  Mild mitral regurgitation.  No     evidence of thrombus in atrial cavity or appendage in the left or     right atrium.  Patent foramen ovale cannot be excluded.  No     evidence of endocarditis or vegetation.  PHYSICAL EXAM AT TIME OF DISCHARGE:  On the day of the patient's discharge vital signs were stable with a temperature of 97.5, blood pressure of 139/94, heart rate of 85, respiratory rate of 17, O2 saturations were 97% on room air.  The patient's physical exam was without acute findings.  The patient was alert, oriented, interactive, and conversant.  FOLLOW-UP:  At such time the patient is deemed to be stable for discharge from the rehab unit, he will need to be scheduled with a follow-up appointment a [facility]. 1. This will need to include a repeat CT angio of the head and neck in     approximately 3 months' time to reevaluate his right common carotid     artery thrombus. 2. At such time that the patient is ready for discharge from the rehab     facility, communication will need to be established with his     primary care physician at [facility] to assure that his     Coumadin can be followed on a regular basis and his INR maintained     at a therapeutic level.  At this time it is anticipated that he     will need 3 months of Coumadin therapy with further decision on the     length  of treatment to be determined by his follow-up CT angio of     the head  and neck as discussed above.  HOSPITAL COURSE:  [name] is a 50 year old gentleman who was brought to the hospital November 02, 2009 after he was found down by family.  During the initial portion of his hospital stay he was in an acute delirium and essentially unresponsive to the staff and examining physicians.  A full neurologic evaluation was carried out.  This included initially a CT of the head and cervical spine.  Results are as noted above.  The patient was admitted to the acute units.  Full stroke evaluation was carried out.  Given the  hemorrhagic component of the patient's multifocal CVAs, Neurosurgery was also involved.  No neurosurgical intervention was necessary.  Anticoagulation did have to be held initially given the patient's hemorrhagic infarcts.  Further workup was pursued in regard to the patient's CVAs.  This included a transthoracic echocardiogram which was wholly unremarkable.  When a CT angio of the neck, however, revealed a right common carotid artery embolus the decision was made to pursue transesophageal echocardiogram. [facility] was consulted, and carried out this procedure on November 07, 2010.  Fortunately there was no evidence of intracardiac thrombus on TEE.  Neurology has recommended 3 months of Coumadin therapy for the patient's right common carotid artery thrombus. The patient will need a follow-up CT angio of the head and neck as he approaches the end of his 46-month Coumadin course.  He will also need to be referred back to [facility].  [facility] will then make a decision as to whether or not the patient needs to continue the Coumadin therapy at that time with the data available from his repeat CT angio in hand.  Physical therapy and occupational therapy along with speech therapy were involved in the patient's care throughout his stay.  As the patient began to slowly improve, he was  ultimately cleared for a regular diet with thin liquids. PT and OT evaluated the patient, and felt that the patient would be most appropriate for inpatient rehab stay.  The rehab team was consulted, and agreed the patient would do well in their unit.  The patient has a family member who is willing to come to town and live with him 24/7 at such time that he stable for discharge from the rehab unit.  As noted above there was concern at the time of the patient's initial presentation that he was abusing narcotics.  The primary evidence of this was the fact that he had 16 fentanyl patches on his body at the time of his presentation for admission.  A  psychiatry consultation was requested and successfully carried out.  It was recommended that Haldol be used for agitation which had been encountered.  It was felt that the patient did in fact have evidence of chronic opioid dependence.  At the suggestion of the psychiatrist we are using only oxycodone at 5 mg every 6 hours as needed for severe pain, and to also prevent opioid withdrawal.  The patient himself has denied any narcotic abuse problems. Additionally, it is appreciated that the patient is on Adderall.  The exact dose that he uses in the outpatient setting is not clear.  Some information available in the patient's old records suggest that he has a history of narcolepsy.  Indeed, the patient was in fact quite lethargic when his Adderall was being held.  Other records,  however, suggested he suffers with attention deficit disorder.  I doubt this diagnosis based on the patient's symptoms during his hospital stay.  Nonetheless, it was felt appropriate given the patient's lethargy to resume Adderall at a low dose.  He was placed on 5 mg XR on a daily basis.  With this his mental status improved significantly, and he was much more alert.  For now we will continue this medication.  We will refer him back to his primary care physician at the time  that he is discharged from the rehab unit to determine if further titration of his medication is required.     Cherene Altes, M.D.     JTM/MEDQ  D:  11/12/2010  T:  11/12/2010  Job:  [id number]  Electronically Signed by Joette Catching M.D. on 11/27/2010 09:48:58 AM

## 2010-12-08 NOTE — H&P (Signed)
[name], [name]                  ACCOUNT NO.:  [id number]  MEDICAL RECORD NO.:  [id number]          PATIENT TYPE:  INP  LOCATION:  2913                         FACILITY:  [facility]  PHYSICIAN:  Robert Repress, MD        DATE OF BIRTH:  Jul 15, 1961  DATE OF ADMISSION:  11/02/2010 DATE OF DISCHARGE:                             HISTORY & PHYSICAL   HISTORY OF PRESENT ILLNESS:  A 50 year old male apparently was found down.  He was apparently found down by his son after fall of unknown start time.  His son was unable to pick the patient up, so he called EMS.  The patient was apparently lethargic and would not follow commands.  He was found to have 16 fentanyl patches applied to the hips and arms.  O2 sats were 80% initially and then on O2 came up to 95%. The patient could not move his left side at all.  The patient himself states to me that he banged his head against the counter or the floor and he cannot remember.  The patient denies any chest pain, palpitations, shortness of breath, nausea, or vomiting.  He does note he is having difficulty seeing.  The patient was brought to the ED for evaluation and a code stroke was apparently called.  A CT brain was performed and showed right frontal lobe parenchymal hematoma and left occipital lobe subarachnoid hemorrhage.  There were multifocal areas of low density involving the right frontal lobe and right parietal lobe and left occipital lobe which were nonspecific, but may represent multifocal areas of infarct.  If there is a history of neoplasm, this could represent areas of metastasis.  MRI of the brain is recommended for further evaluation.  CT scan of the cervical spine showed no acute cervical spine findings, no evidence of any fracture.  Chest x-ray showed biapical thickening, postoperative changes of the right apex and no acute findings.  The patient was also noted to be in acute renal failure with BUN and creatinine of 30 and 4.11.  He was  also noted to be in mild rhabdomyolysis.  His initial CK was 3035, MB was 182.5 and relative index 6.0 and troponin I 0.03.  His liver function was slightly elevated with an AST of 65 and an ALT of 29, alk phos and total bilirubin were normal.  Urine drug screen was positive for amphetamines and the patient admits to taking some amphetamine salt combination for ADD.  The patient will be admitted for altered mental status, intraparenchymal  brain hemorrhage, acute renal failure, and rhabdomyolysis.  PAST MEDICAL HISTORY: 1. Attention deficit disorder. 2. Chronic shoulder pain with history of multiple prior rotator cuff     surgeries. 3. Nephrolithiasis. 4. Pneumothorax. 5. Hypertension. 6. Tobacco dependence.  PAST SURGICAL HISTORY: 1. Multiple shoulder surgeries. 2. Right video-assisted thoracoscopy with stapling of apical blebs and     mechanical pleural scarification by Dr. Lanelle [name] on November 22, 2008.  SOCIAL HISTORY:  The patient smokes one to two packs per day x30 plus years.  He does not drink.  He is on Electrical engineer.  FAMILY HISTORY:  There is no family history of stroke.  There is some family history of heart disease.  Mother died in her 71s.  Father died in his 53s from heart disease.  ALLERGIES:  No known drug allergies.  MEDICATIONS:  Lisinopril, fentanyl patch, and amphetamine.  REVIEW OF SYSTEMS:  Negative for all ten review of systems except for pertinent positives stated above.  PHYSICAL EXAMINATION:  VITAL SIGNS:  Temperature 98.3, pulse 89, blood pressure 118/82, and pulse ox is 100% on room air. HEENT:  Anicteric, pupils are 1.5 mm, symmetric, direct, consensual near reflexes intact, however visual fields are not intact.  The patient has difficulty seeing, especially with left word gaze.  No evidence of facial droop and tongue is midline. NECK:  No JVD.  No bruit. HEART:  Regular rate rhythm.  S1, S2 without murmur, gallop, or  rub. LUNGS:  Clear to auscultation bilaterally. ABDOMEN:  Soft, nontender, and nondistended.  Positive bowel sounds. EXTREMITIES:  No cyanosis, clubbing, or edema. SKIN:  No rashes. LYMPH NODES:  No adenopathy. NEURO:  Vision is not intact.  Reflexes are hyperreflexive throughout with upper toe on the left and downward toe on the right.  Motor strength 0/5 left upper extremity, otherwise 5/5 in the other extremities.  ASSESSMENT AND PLAN: 1. Cerebrovascular accident.  The patient is placed on telemetry.  We     will check cardiac markers.  We will check MRI brain, MRA brain,     carotid ultrasound, and cardiac 2-D echo. 2. Rhabdomyolysis, aggressive hydration with normal saline IV. 3. Acute renal failure.  Most likely secondary to rhabdomyolysis and     lisinopril.  Discontinue lisinopril.  Hydrate aggressively with     normal saline.  Check an abdominal ultrasound to rule out any     obstruction.  Check urine sodium, urine creatinine, and urine     eosinophils. 4. Abnormal liver function tests.  Check right upper quadrant     ultrasound. 5. Attention deficit disorder.  Please find out home ADD medication in     the morning. 6. Intracerebral hemorrhage.  Appreciate Dr. Lacy [name] and Dr. Rhea [name]     input. 7. Deep vein thrombosis prophylaxis, SCDs intense in light of     intraparenchymal hemorrhage. 8. Fentanyl overdose.  All patches were removed.  The patient was     instructed not to overdose again.  The patient     had multiple patches due to intensity of the pain, not being able     to afford his pain medication.  He would re-patch old patches on     his body. 9. Tobacco dependence.  The patient was counseled x10 minutes on     smoking cessation.     Robert Repress, MD     JYK/MEDQ  D:  11/03/2010  T:  11/03/2010  Job:  [id number]  cc:   Dr. Gloris Ham, MD  Electronically Signed by Robert Gravel MD on 12/08/2010 10:42:45 PM

## 2010-12-11 NOTE — Discharge Summary (Signed)
NAMETRIP, [name]                  ACCOUNT NO.:  [id number]  MEDICAL RECORD NO.:  [id number]          PATIENT TYPE:  IPS  LOCATION:  [id number]                         FACILITY:  [facility]  PHYSICIAN:  Meredith Staggers, M.D.DATE OF BIRTH:  Apr 09, 1961  DATE OF ADMISSION:  11/13/2010 DATE OF DISCHARGE:  11/26/2010                              DISCHARGE SUMMARY   DISCHARGE DIAGNOSES: 1. 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 2. Chronic Coumadin therapy. 3. Pain management. 4. Delirium - resolving. 5. Multiple rotator cuff surgeries with chronic pain. 6. Attention deficit hyperactivity disorder. 7. Opioid with amphetamine abuse. 8. Tobacco abuse. 9. Hypertension.  A 50 year old white male, history of ADHD, chronic pain syndrome with multiple shoulder surgeries, recent VATS for recurrent pneumothorax on November 06, 2010.  Admitted November 02, 2010, after being found down by his son with altered mental status, found to have 16 fentanyl patches to his hips and arms.  Cranial CT scan with small right frontal lobe parenchymal hematoma and left occipital subarachnoid hematoma.  MRI of the brain with multiple scattered infarctions; right middle cerebral artery, left occipital lobe.  Neurosurgery, Dr. Christella Noa advised conservative care.  Carotid angiogram of the neck with a 5 x 10 right common carotid thrombus.  Coumadin therapy was initiated with plan to continue for 3 months.  Also plan for an outpatient TCD bubble study.  A TEE was completed, no thrombus seen.  Followup Psychiatry Services for delirium, maintained on Haldol, close monitoring of narcotics with low- dose oxycodone immediate release 5 mg every 6 hours as needed.  He was moderate assist for ambulation.  He was admitted for comprehensive rehab program.  PAST MEDICAL HISTORY:  See discharge diagnoses.  He smokes one pack per day.  No alcohol.  ALLERGIES:  None.  SOCIAL HISTORY:   He lives with a 10 year old son.  The patient is on disability, 1-level home, 19 year old daughter also will do home works. Functional history prior to admission was independent.  Functional status upon admission to rehab services was minimal assist bed mobility and transfers, moderate assist ambulation, minimum-to-max assist activities of daily living.  MEDICATIONS PRIOR TO ADMISSION: 1. Adderall 20 mg daily. 2. Fentanyl patch 125 mcg change every 72 hours.  PHYSICAL EXAMINATION:  VITAL SIGNS:  Blood pressure 139/94, pulse 85, temperature 97, respirations 18. NEUROLOGIC:  This was an alert male in no acute distress.  Flat affect. Names person, place, date of birth.  Follows two-step commands. Decreased recall of events.  Deep tendon reflexes hyperactive, withdrawal to deep stimuli. EXTREMITIES:  Noted psoriasis to hands and arms. LUNGS:  Clear to auscultation. CARDIAC:  Regular rate and rhythm. ABDOMEN:  Soft, nontender.  Good bowel sounds.  REHABILITATION HOSPITAL COURSE:  The patient was admitted to inpatient rehab services with therapies initiated on a 3-hour daily basis consisting of physical therapy, occupational therapy, speech therapy, and rehabilitation nursing.  The following issues were addressed during the patient's rehabilitation stay.  Pertaining to [name]'s multiple distribution infarction with findings of right common carotid thrombus, left-sided weakness, Coumadin therapy had been initiated with  a goal INR of 2.0-3.0.  Plan was to remain on Coumadin therapy for 3 months, follow up outpatient Neurology Services for planned TCD bubble study.  His Coumadin was followed by Dr. Maryland Pink at [phone] in regards to his Coumadin.  Pain management was an issue during his rehab hospital stay with noted findings of 16 fentanyl patches on his body for a history of chronic pain and multiple shoulder surgeries.  Bouts of delirium felt to be related to withdrawal from  possibly opioid and amphetamine abuse.  He had been placed on low-dose oxycodone immediate release and slowly titrated to 1-2 tablets every 8 hours as needed for pain which he was tolerating quite nicely.  It was discussed at length the need for weaning of his narcotics.  He would follow up in the outpatient rehab center in regards to his recent stroke as well as issues in regards to his pain management.  He remained on Adderall for history of ADHD at 5 mg daily.  His blood pressures were monitored on low-dose clonidine patch change weekly and again, discussed the need to be compliant with these medications.  He did have a history of tobacco abuse, he was weaned from his NicoDerm patch.  The patient received weekly collaborative interdisciplinary team conferences to discuss estimated length of stay, family teaching, and any barriers to discharge.  He was minimal assist for ambulation.  His balance was improving, but he was still somewhat impulsive, minimal assist upper body, moderate assist lower body, minimal assist for toileting.  He was on a regular diet, tolerating well.  He was continent of bowel and bladder.  It was discussed the need for 24-hour supervision for safety.  Family was doing their best to provide this, however, with a 42 and 52 year old teenage children at home both working and attending school, they felt this was going to be difficult to do.  There were issues in regards to possible nursing home placement which the patient adamantly refused and he was competent to make these decisions.  The patient was able to complete functional and basic tasks as well as basic problem solving.  However, he did have more difficulty with more complex reasoning and problem solving.  Ongoing therapies would be dictated as per rehab services.  Latest labs showed a hemoglobin 14.5, hematocrit 43, platelet 432,000. Sodium 135, potassium 4.0, BUN 19, creatinine 0.8.  Latest INR of 2.32 on 5  mg of Coumadin.  Discharge medications included: 1. Coumadin 5 mg daily. 2. Multivitamin 1 tablet daily. 3. Clonidine patch 0.2 mg change weekly. 4. Adderall XR 5 mg p.o. daily. 5. NicoDerm patch taper as advised. 6. Baclofen 10 mg p.o. every 8 hours. 7. Oxycodone immediate release 5 mg 1-2 tablets every 8 hours as     needed for pain, dispense of 60 tablets.  DIET:  Regular.  SPECIAL INSTRUCTIONS:  Home Health nurse to check prothrombin time on Wednesday, November 28, 2010.  Results to Dr. Maryland Pink at [phone], fax number [phone].  The patient would follow up with Dr. Alger Simons at the outpatient rehab center as advised.  Home health therapies as dictated per rehab services.     Lauraine Rinne, P.A.   ______________________________ Meredith Staggers, M.D.    DA/MEDQ  D:  11/26/2010  T:  11/26/2010  Job:  [id number]  cc:   Micki Riley, M.D. Pramod P. Leonie Man, MD Marlou Sa, MD  Electronically Signed by Lauraine Rinne P.A. on 11/26/2010 02:29:54 PM Electronically Signed  by Alger Simons M.D. on 12/11/2010 04:31:25 PM

## 2011-01-01 ENCOUNTER — Inpatient Hospital Stay (HOSPITAL_BASED_OUTPATIENT_CLINIC_OR_DEPARTMENT_OTHER): Payer: Medicaid Other | Admitting: Physical Medicine & Rehabilitation

## 2011-01-01 ENCOUNTER — Encounter: Payer: Medicaid Other | Attending: Physical Medicine & Rehabilitation

## 2011-01-01 DIAGNOSIS — I63239 Cerebral infarction due to unspecified occlusion or stenosis of unspecified carotid arteries: Secondary | ICD-10-CM

## 2011-01-01 DIAGNOSIS — F17200 Nicotine dependence, unspecified, uncomplicated: Secondary | ICD-10-CM | POA: Insufficient documentation

## 2011-01-01 DIAGNOSIS — F1920 Other psychoactive substance dependence, uncomplicated: Secondary | ICD-10-CM

## 2011-01-01 DIAGNOSIS — M7530 Calcific tendinitis of unspecified shoulder: Secondary | ICD-10-CM

## 2011-01-01 DIAGNOSIS — M719 Bursopathy, unspecified: Secondary | ICD-10-CM | POA: Insufficient documentation

## 2011-01-01 DIAGNOSIS — G894 Chronic pain syndrome: Secondary | ICD-10-CM | POA: Insufficient documentation

## 2011-01-01 DIAGNOSIS — I10 Essential (primary) hypertension: Secondary | ICD-10-CM | POA: Insufficient documentation

## 2011-01-01 DIAGNOSIS — I69998 Other sequelae following unspecified cerebrovascular disease: Secondary | ICD-10-CM | POA: Insufficient documentation

## 2011-01-01 DIAGNOSIS — M25559 Pain in unspecified hip: Secondary | ICD-10-CM | POA: Insufficient documentation

## 2011-01-01 DIAGNOSIS — IMO0001 Reserved for inherently not codable concepts without codable children: Secondary | ICD-10-CM

## 2011-01-01 DIAGNOSIS — M67919 Unspecified disorder of synovium and tendon, unspecified shoulder: Secondary | ICD-10-CM | POA: Insufficient documentation

## 2011-01-01 DIAGNOSIS — M25519 Pain in unspecified shoulder: Secondary | ICD-10-CM | POA: Insufficient documentation

## 2011-02-06 ENCOUNTER — Emergency Department (HOSPITAL_COMMUNITY): Payer: Medicaid Other

## 2011-02-06 ENCOUNTER — Observation Stay (HOSPITAL_COMMUNITY)
Admission: EM | Admit: 2011-02-06 | Discharge: 2011-02-08 | DRG: 312 | Disposition: A | Discharge status: Home / Self Care | Payer: Medicaid Other | Attending: Neurology | Admitting: Neurology

## 2011-02-06 ENCOUNTER — Encounter (HOSPITAL_COMMUNITY): Payer: Self-pay | Admitting: Radiology

## 2011-02-06 DIAGNOSIS — R791 Abnormal coagulation profile: Secondary | ICD-10-CM | POA: Insufficient documentation

## 2011-02-06 DIAGNOSIS — R42 Dizziness and giddiness: Secondary | ICD-10-CM | POA: Insufficient documentation

## 2011-02-06 DIAGNOSIS — Z8673 Personal history of transient ischemic attack (TIA), and cerebral infarction without residual deficits: Secondary | ICD-10-CM | POA: Insufficient documentation

## 2011-02-06 DIAGNOSIS — F17200 Nicotine dependence, unspecified, uncomplicated: Secondary | ICD-10-CM | POA: Insufficient documentation

## 2011-02-06 DIAGNOSIS — R55 Syncope and collapse: Secondary | ICD-10-CM | POA: Insufficient documentation

## 2011-02-06 DIAGNOSIS — N390 Urinary tract infection, site not specified: Secondary | ICD-10-CM | POA: Insufficient documentation

## 2011-02-06 DIAGNOSIS — M79609 Pain in unspecified limb: Secondary | ICD-10-CM | POA: Insufficient documentation

## 2011-02-06 DIAGNOSIS — I10 Essential (primary) hypertension: Secondary | ICD-10-CM | POA: Insufficient documentation

## 2011-02-06 DIAGNOSIS — I951 Orthostatic hypotension: Principal | ICD-10-CM | POA: Insufficient documentation

## 2011-02-06 DIAGNOSIS — R51 Headache: Secondary | ICD-10-CM | POA: Insufficient documentation

## 2011-02-06 DIAGNOSIS — Z7901 Long term (current) use of anticoagulants: Secondary | ICD-10-CM | POA: Insufficient documentation

## 2011-02-06 HISTORY — DX: Cerebral infarction, unspecified: I63.9

## 2011-02-06 HISTORY — DX: Essential (primary) hypertension: I10

## 2011-02-06 LAB — URINALYSIS, ROUTINE W REFLEX MICROSCOPIC
Bilirubin Urine: NEGATIVE
Glucose, UA: NEGATIVE mg/dL
Ketones, ur: NEGATIVE mg/dL
pH: 6.5 (ref 5.0–8.0)

## 2011-02-06 LAB — DIFFERENTIAL
Basophils Absolute: 0 10*3/uL (ref 0.0–0.1)
Eosinophils Absolute: 0.4 10*3/uL (ref 0.0–0.7)
Eosinophils Relative: 3 % (ref 0–5)
Lymphocytes Relative: 14 % (ref 12–46)
Monocytes Absolute: 0.5 10*3/uL (ref 0.1–1.0)

## 2011-02-06 LAB — BASIC METABOLIC PANEL
BUN: 14 mg/dL (ref 6–23)
Chloride: 102 mEq/L (ref 96–112)
Creatinine, Ser: 0.71 mg/dL (ref 0.4–1.5)
Glucose, Bld: 135 mg/dL — ABNORMAL HIGH (ref 70–99)
Potassium: 3.4 mEq/L — ABNORMAL LOW (ref 3.5–5.1)

## 2011-02-06 LAB — CBC
HCT: 39.8 % (ref 39.0–52.0)
MCHC: 33.7 g/dL (ref 30.0–36.0)
MCV: 92.6 fL (ref 78.0–100.0)
Platelets: 389 10*3/uL (ref 150–400)
RDW: 13.9 % (ref 11.5–15.5)

## 2011-02-06 LAB — URINE MICROSCOPIC-ADD ON

## 2011-02-06 LAB — RAPID URINE DRUG SCREEN, HOSP PERFORMED
Barbiturates: NOT DETECTED
Cocaine: NOT DETECTED
Opiates: NOT DETECTED

## 2011-02-06 LAB — PROTIME-INR: INR: 1.79 — ABNORMAL HIGH (ref 0.00–1.49)

## 2011-02-07 ENCOUNTER — Inpatient Hospital Stay (HOSPITAL_COMMUNITY): Payer: Medicaid Other

## 2011-02-07 MED ORDER — GADOBENATE DIMEGLUMINE 529 MG/ML IV SOLN
15.0000 mL | Freq: Once | INTRAVENOUS | Status: AC
  Administered 2011-02-07: 15 mL via INTRAVENOUS

## 2011-02-08 ENCOUNTER — Encounter: Payer: Medicaid Other | Attending: Physical Medicine & Rehabilitation | Admitting: Physical Medicine & Rehabilitation

## 2011-02-08 LAB — PROTIME-INR
INR: 2.2 — ABNORMAL HIGH (ref 0.00–1.49)
Prothrombin Time: 24.6 seconds — ABNORMAL HIGH (ref 11.6–15.2)

## 2011-02-08 LAB — URINE CULTURE
Colony Count: >=100000
Culture  Setup Time: 201204112255

## 2011-02-09 NOTE — Discharge Summary (Signed)
NAMEAZELL, WEGLEITNER                  ACCOUNT NO.:  [id number]  MEDICAL RECORD NO.:  [id number]           PATIENT TYPE:  I  LOCATION:  [id number]                         FACILITY:  [facility]  PHYSICIAN:  Alexis Goodell, MD    DATE OF BIRTH:  06-29-1961  DATE OF ADMISSION:  02/06/2011 DATE OF DISCHARGE:  02/08/2011                              DISCHARGE SUMMARY   ADMISSION DIAGNOSIS:  Syncope.  DISCHARGE DIAGNOSES: 1. Orthostasis, likely cause of syncope. 2. Chronic headache. 3. Acute infarct, on Coumadin, presenting with subtherapeutic INR. 4. UTI  PROCEDURES: 1. EEG. 2. MRI of the brain. 3. MRA of the head and neck.  HISTORY OF PRESENT ILLNESS:  [name] is a 50 year old gentleman that had multiple strokes in January 2012 when he was noted to have a left PCA and right parietal infarct.  At that time, he was found to have a right ICA thrombus as well.  The patient was started on Coumadin.  He has been followed on an outpatient basis.  He presented with multiple complaints that were somewhat unusual.  On reviewing his past outpatient encounters, he has only really had complaints of pain.  On presentation, he reports that he has had multiple syncopal episodes and had been having them for multiple months, even prior to January.  He reports having dizziness prior to the syncope.  He did not have any bruises or has not had any significant injury secondary to his syncope.  The patient is on Coumadin.  PAST MEDICAL HISTORY:  Right internal carotid artery thrombus, ADD, chronic pain disorder, hypertension, pneumothorax with VATS procedure, renal calculi, bilateral rotator cuff surgeries.  MEDICATIONS PRIOR TO ADMISSION:  Adderall, fentanyl, Coumadin, and baclofen.  SOCIAL HISTORY:  The patient has a history of tobacco abuse.  He reports not being a drinker. He is married and does not report any history of illicit drug use.  HOSPITAL COURSE:  The patient was admitted.  The patient did have  some signs of orthostasis with orthostatic testing.  The patient was noted to have a drop in his systolic pressures from 20 to 25 mmHg.  Diastolic remained stable.  Pulse was stable as well.  The patient did not have any syncopal episodes while hospitalized.  MRI imaging was performed. MRI showed 2 small areas of acute infarction in the high right frontal lobe and in the right parietal lobe.  MRA showed internal carotid arteries to be patent bilaterally.  No other significant stenosis was noted.  No aneurysmal dissections were noted.  EEG was performed that was unremarkable.  It was determined that despite the fact that the patient did have 2 acute infarcts there was no longer any thrombus present.  He is definitely a fall risk with him having syncopal episodes daily and therefore the Coumadin was discontinued. The patient also reported having difficulty following up for  his INR maintenance since his previous PCP that was closer to him released him from his care to be followed up in [place].  He was placed on Aspirin and Plavix for stroke prophylaxis. The patient is also to follow up with Dr.  Sethi.  Will continue  follow up with Dr. Naaman Plummer as well.    The patient's biggest complaint during the hospitalization was that of a headache.  He is already on a fentanyl patch and baclofen.  He refused multiple other interventions, requesting further narcotics.  This was not given.  He was started on headache prophylaxis.  This will be followed on an outpatient basis as well.  Inital laboratory data revealed a UTI.  Patient started on Cipro to continue for 5 days.  PLAN: 1. D/C Coumadin with start of Plavix and aspirin. 2. The patient is to follow up with Dr. Naaman Plummer. 3. The patient is to follow up with Dr. Leonie Man at discharge for stroke     management. 4. Norvasc decreased to 2.5 mg to address orthostasis. 5. Topamax 25 mg p.o. at bedtime started for headache prophylaxis, may     be increased  as necessary. 6. The patient was advised to discontinue smoking. 7. Patient advised that he is not to drive.  DISCHARGE DISPOSITION:  Stable.  Discharge activity level as tolerated with the patient being restricted from working at heights or driving.  DISCHARGE DIET: heart-healthy.  DISCHARGE MEDICATIONS: 1. Amlodipine 2.5 mg p.o. daily. 2. Aspirin 81mg  p.o. daily. 3. Ciprofloxacin 25 mg p.o. b.i.d. for 3 days. 4. Topamax 25 mg p.o. at bedtime. 5. Adderall 20 mg 2 tablets by mouth twice daily. 6. Baclofen 1 tablet by mouth every 8 hours. 7. Fentanyl patch 100 mcg per hour every 2 days. 8. Multivitamin. 9. Plavix 75mg  p.o. daily.          ______________________________ Alexis Goodell, MD     LR/MEDQ  D:  02/08/2011  T:  02/08/2011  Job:  [id number]  Electronically Signed by Alexis Goodell MD on 02/09/2011 12:51:24 PM

## 2011-02-11 LAB — COMPREHENSIVE METABOLIC PANEL
ALT: 8 U/L (ref 0–53)
AST: 19 U/L (ref 0–37)
Albumin: 3.5 g/dL (ref 3.5–5.2)
Alkaline Phosphatase: 78 U/L (ref 39–117)
BUN: 10 mg/dL (ref 6–23)
CO2: 30 mEq/L (ref 19–32)
Calcium: 9.3 mg/dL (ref 8.4–10.5)
Chloride: 100 mEq/L (ref 96–112)
Creatinine, Ser: 0.75 mg/dL (ref 0.4–1.5)
GFR calc Af Amer: >60 mL/min (ref >60)
GFR calc non Af Amer: >60 mL/min (ref >60)
Glucose, Bld: 89 mg/dL (ref 70–99)
Potassium: 4 mEq/L (ref 3.5–5.1)
Sodium: 138 mEq/L (ref 135–145)
Total Bilirubin: 0.6 mg/dL (ref 0.3–1.2)
Total Protein: 6.5 g/dL (ref 6.0–8.3)

## 2011-02-11 LAB — CBC
HCT: 38.2 % — ABNORMAL LOW (ref 39.0–52.0)
HCT: 38.7 % — ABNORMAL LOW (ref 39.0–52.0)
Hemoglobin: 13 g/dL (ref 13.0–17.0)
Hemoglobin: 13 g/dL (ref 13.0–17.0)
MCHC: 33.6 g/dL (ref 30.0–36.0)
MCHC: 34.1 g/dL (ref 30.0–36.0)
MCV: 95.6 fL (ref 78.0–100.0)
MCV: 97.3 fL (ref 78.0–100.0)
Platelets: 346 10*3/uL (ref 150–400)
Platelets: 352 10*3/uL (ref 150–400)
Platelets: 353 10*3/uL (ref 150–400)
RBC: 3.98 MIL/uL — ABNORMAL LOW (ref 4.22–5.81)
RBC: 4 MIL/uL — ABNORMAL LOW (ref 4.22–5.81)
RDW: 12.6 % (ref 11.5–15.5)
RDW: 12.6 % (ref 11.5–15.5)
RDW: 12.8 % (ref 11.5–15.5)
WBC: 10.9 10*3/uL — ABNORMAL HIGH (ref 4.0–10.5)
WBC: 7.8 10*3/uL (ref 4.0–10.5)
WBC: 8.1 10*3/uL (ref 4.0–10.5)

## 2011-02-11 LAB — BASIC METABOLIC PANEL
BUN: 14 mg/dL (ref 6–23)
BUN: 8 mg/dL (ref 6–23)
CO2: 27 mEq/L (ref 19–32)
Calcium: 9.1 mg/dL (ref 8.4–10.5)
Calcium: 9.6 mg/dL (ref 8.4–10.5)
Chloride: 100 mEq/L (ref 96–112)
Creatinine, Ser: 0.8 mg/dL (ref 0.4–1.5)
Creatinine, Ser: 1.06 mg/dL (ref 0.4–1.5)
GFR calc Af Amer: >60 mL/min (ref >60)
GFR calc non Af Amer: >60 mL/min (ref >60)
GFR calc non Af Amer: >60 mL/min (ref >60)
Glucose, Bld: 101 mg/dL — ABNORMAL HIGH (ref 70–99)
Glucose, Bld: 110 mg/dL — ABNORMAL HIGH (ref 70–99)
Potassium: 4.1 mEq/L (ref 3.5–5.1)
Sodium: 138 mEq/L (ref 135–145)

## 2011-02-11 LAB — DIFFERENTIAL
Basophils Absolute: 0.1 10*3/uL (ref 0.0–0.1)
Lymphocytes Relative: 29 % (ref 12–46)
Lymphs Abs: 2.2 10*3/uL (ref 0.7–4.0)
Neutrophils Relative %: 60 % (ref 43–77)

## 2011-02-11 LAB — PROTIME-INR
INR: 0.9 (ref 0.00–1.49)
Prothrombin Time: 12.7 seconds (ref 11.6–15.2)

## 2011-03-11 ENCOUNTER — Encounter: Payer: Medicaid Other | Attending: Physical Medicine & Rehabilitation | Admitting: Physical Medicine & Rehabilitation

## 2011-03-11 DIAGNOSIS — I63239 Cerebral infarction due to unspecified occlusion or stenosis of unspecified carotid arteries: Secondary | ICD-10-CM

## 2011-03-11 DIAGNOSIS — F1920 Other psychoactive substance dependence, uncomplicated: Secondary | ICD-10-CM

## 2011-03-11 DIAGNOSIS — M25519 Pain in unspecified shoulder: Secondary | ICD-10-CM | POA: Insufficient documentation

## 2011-03-11 DIAGNOSIS — G5700 Lesion of sciatic nerve, unspecified lower limb: Secondary | ICD-10-CM | POA: Insufficient documentation

## 2011-03-11 DIAGNOSIS — I69998 Other sequelae following unspecified cerebrovascular disease: Secondary | ICD-10-CM | POA: Insufficient documentation

## 2011-03-11 DIAGNOSIS — M12519 Traumatic arthropathy, unspecified shoulder: Secondary | ICD-10-CM

## 2011-03-11 DIAGNOSIS — G894 Chronic pain syndrome: Secondary | ICD-10-CM | POA: Insufficient documentation

## 2011-03-11 DIAGNOSIS — IMO0001 Reserved for inherently not codable concepts without codable children: Secondary | ICD-10-CM

## 2011-03-11 DIAGNOSIS — I10 Essential (primary) hypertension: Secondary | ICD-10-CM | POA: Insufficient documentation
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
## 2011-03-12 NOTE — Consult Note (Signed)
[name], [name]                  ACCOUNT NO.:  [id number]   MEDICAL RECORD NO.:  [id number]          PATIENT TYPE:  INP   LOCATION:  3301                         FACILITY:  [facility]   PHYSICIAN:  Lanelle Bal, MD    DATE OF BIRTH:  1961/01/26   DATE OF CONSULTATION:  10/16/2008  DATE OF DISCHARGE:                                 CONSULTATION   REQUESTING PHYSICIAN:  Leanna Sato. Elsworth Soho, MD   PRIMARY CARE PHYSICIAN:  Maryland Pink, MD, [facility].   ADMITTING PHYSICIAN:  Ander Purpura. Caporossi, MD   REASON FOR CONSULTATION:  Presentation with spontaneous pneumonia with  chest tube in.   HISTORY OF PRESENT ILLNESS:  The patient is a 50 year old chronic smoker  who noted on October 12, 2008, two days prior to admission spasm in his  right flank, right chest pain, and shortness of breath.  Over the next 2  days, he continued to have shortness of breath with increasing sputum  production.  Denies any definite fever or chills, came to the [facility]  Emergency Room on the evening of October 14, 2008.  A chest x-ray was  performed, which demonstrated a significant right pneumothorax.  At some  point, a right chest tube was placed, the documentation of this does not  existing in the patient's current chart, who placed the tube, but a  follow up chest x-ray shows a resolution of the right pneumothorax.  Yesterday, it was decided by the Teaching Service to perform a CT scan  on the patient.  This revealed some emphysematous changes in the lungs  and bilateral apical blebs, question of early pneumonic process in the  left lung and very small less than 1-2% residual pneumothorax on the  right.   PAST MEDICAL HISTORY:  1. The patient has chronic disability.  2. History of ADHD.  3. Lithotripsy for kidney stones.  4. Rotator cuff surgery x3 on the right, most recent one was year and      a half ago.   MEDICATIONS AT THE TIME OF ADMISSION:  1. Adderall 60 mg a day.  2. Lorcet 10/650 six  to eight per day for at least several years.   The patient is a current smoker up to 2 packs a day for 30 years.  He is  married, unemployed, covered by [employer].  Notes that he has applied for  disability, this has not been confirmed.  He notes that he is disabled  because of shoulder pain.   FAMILY HISTORY:  The patient's mother has had heart problems, has  siblings with schizophrenia.  Father died in his 65s of heart disease.  He has had 2  aunts with a history of lung cancer.   REVIEW OF SYSTEMS:  Denies fever, chills, does have some night sweats  and diaphoresis, presented with chest pain, dyspnea on exertion, cough,  sputum production, nausea, or right flank pain.  Denies hematuria,  dysuria, chronic right shoulder pain, or chronic headache.   PHYSICAL EXAMINATION:  VITAL SIGNS:  His blood pressure is 140/70, pulse  is 90, O2 sats  on room air in the low 90s, and temperature is 98.  GENERAL:  The patient appears older than his stated age of 32 years  obviously with facies of chronic smoker.  NECK:  I do not appreciate any carotid bruits.  PULMONARY:  He has breath sounds bilaterally without active wheezing.  ABDOMINAL:  Benign without palpable masses or tenderness.   LABORATORY DATA ON ADMISSION:  Hematocrit of 39.8, white count of  13,000, and platelet count of 340,000.  Initial troponin was 0.05, CK-MB  13.5, total CK was 161.  Chest x-ray showed a large right pneumothorax.  Subsequent films on reviewing showed a resolution of pneumothorax on  plain film and bilateral apical blebs as noted above.   IMPRESSION:  1. Chronic smoker with no evidence of tobacco-related lung disease and      spontaneous right pneumothorax.  2. Chronic disability for chronic shoulder pain associated with long-      term constant narcotic use.   SUGGESTIONS:  At this point, the patient's chest x-ray shows reinflation  of the lung.  There is no air leak, tubes in good position with being  actively  treated for pneumonia.  No previous recurrence of pneumothorax  and now with no air leak, I would place the chest tube to water seal  over 24-36 hours to monitor the patient's condition on water seal with a  followup PA and lateral chest x-ray, then consider tube removal.  I have  explained to the patient that if he continues to smoke, he will have a  continued downhill health course and likely definitely increases  incidents of recurrent pulmonary problems.  If after removal of chest  tube he has early recurrence of pneumothorax, we would recommend  proceeding with a video-assisted thoracoscopy and stapling of the apical  blebs.       Lanelle Bal, MD  Electronically Signed     EG/MEDQ  D:  10/16/2008  T:  10/17/2008  Job:  [id number]   cc:   Jay Schlichter, M.D.  Jeffrey P. Vanessa Kick, [place], MD  Maryland Pink

## 2011-03-12 NOTE — H&P (Signed)
[name], [name]                  ACCOUNT NO.:  [id number]   MEDICAL RECORD NO.:  [id number]          PATIENT TYPE:  INP   LOCATION:  [id number]                         FACILITY:  [facility]   PHYSICIAN:  [name], MD      DATE OF BIRTH:  October 25, 1961   DATE OF ADMISSION:  11/20/2008  DATE OF DISCHARGE:                              HISTORY & PHYSICAL   REASON FOR ADMISSION:  Recurrent right spontaneous pneumothorax.   HISTORY OF PRESENT ILLNESS:  [name] is a 50 year old smoker who has  recently quit and is known to me from his admission from October 14, 2008, to October 18, 2008, for a right spontaneous pneumothorax.  He  has quit smoking since then.  He was seen by me in the office on November 08, 2008, and a chest x-ray showed complete resolution of the  pneumothorax.  A CT chest obtained during his prior admission had showed  some airspace disease in the left side, biapical bullae, and a 1.6-cm  left adrenal adenoma.  Dr. Servando Snare from [facility] had been consulted,  and had recommended observation given that this was his first episode.  He was back to his baseline, was doing well on a nicotine patch, and has  stayed off cigarettes.  Today, he denies any unusual exertion over the  last few days.  Today around 4 p.m., he developed sudden onset dyspnea  while talking to his daughter.  His wife is a Engineer, water, listened to  him with a stethoscope, and drawn him to the emergency room.  A chest x-  ray on arrival here showed a right tension pneumothorax with a  completely collapsed right lung.  The chest tube was inserted.  A 15 mg  of etomidate and 4 mg of morphine was used for this procedure.  Chest x-  ray post chest tube insertion shows complete expansion of the right  lung.   PAST MEDICAL HISTORY:  Attention deficit disorder, chronic joint pain  due to osteoarthritis, tobacco abuse.   PAST SURGICAL HISTORY:  Shoulder surgeries.   ALLERGIES:  None known.   MEDICATIONS:  1.  Adderall XR 30 mg q.12 h.  2. Lorcet 10/650 mg 1-2 tablets q.6 h. per day.  3. Nicotine 21 mg per day patch.   SOCIAL HISTORY:  Lives with his wife and daughter.  Currently,  unemployed.  Quit smoking since December 2009, used to smoke 60-pack-  years prior to that.   REVIEW OF SYSTEMS:  Pain is now better.  Dyspnea has improved.  Denies  fevers, weight loss, nausea, vomiting, change in bowel habits, prolonged  cough, headaches.   PHYSICAL EXAMINATION:  GENERAL:  Adult gentleman sitting up in bed in no  apparent respiratory distress.  VITAL SIGNS:  Heart rate 67 per minute, blood pressure 160/100,  respirations 16 per minute.  HEENT:  Pupils equally reactive to light.  NECK:  Supple.  No JVD.  No lymphadenopathy.  CVS:  S1 and S2 normal.  CHEST:  No accessory muscle use.  Decreased breath sounds at right base.  I  could hear air entry in the right apex and back.  Chest tube did not  show any air leaks.  ABDOMEN:  Soft, nontender.  No organomegaly.  NEUROLOGICAL:  Nonfocal.  EXTREMITIES:  No edema.   LABORATORY DATA:  Sodium 140, potassium 3.6, chloride 107, bicarbonate  25, glucose 110, BUN and creatinine 14/1.06.  INR was 0.9 and PTT was  34.  WBC 7.8, hemoglobin 15, platelets 352, and 60% neutrophils.  Chest  x-ray shows right chest tube placement with small residual pneumothorax,  good expansion of the right lung.   IMPRESSION:  1. Recurrent right spontaneous pneumothorax.  2. Heavy tobacco abuse, recent quitter.  3. Apical bullae as demonstrated on the CT scan.   RECOMMENDATIONS:  1. He will be admitted to our service.  2. Morphine 2-4 mg q.4 h. will be used for pain control.  We will also      use Lorcet hopefully to decrease IV morphine usage.  3. Nicotine 21 mg per day patch will be continued.  Albuterol nebs      will be used as needed.  4. We will consult CT Surgery in the morning.  I would recommend VATS      bullectomy and mechanical pleurodesis given recurrence  of his      pneumothorax.  5. From a medical standpoint, I think he would be at low risk for      postoperative complications.  We will obtain a 12-lead EKG in      anticipation of this surgery before being performed.      [name], MD  Electronically Signed     RVA/MEDQ  D:  11/20/2008  T:  11/21/2008  Job:  [id number]

## 2011-03-12 NOTE — H&P (Signed)
[name], [name]                  ACCOUNT NO.:  [id number]  MEDICAL RECORD NO.:  [id number]           PATIENT TYPE:  I  LOCATION:  [id number]                         FACILITY:  [facility]  PHYSICIAN:  Jill Alexanders, M.D.  DATE OF BIRTH:  1961-06-10  DATE OF ADMISSION:  02/06/2011 DATE OF DISCHARGE:                             HISTORY & PHYSICAL   HISTORY OF PRESENT ILLNESS:  [name] is a 50 year old right-handed white male born on 07-26-1961 with a history of cerebrovascular disease.  This patient was admitted to [facility] on November 14, 2010 with a right middle cerebral stroke and a left posterior cerebral artery stroke.  This patient was found to have a thrombus within the right internal carotid artery and has been placed on Coumadin for 3 months because of this.  The patient has undergone rehab and has been home for about 1 month.  The patient claims that prior to the stroke event, he was having multiple syncopal events, usually with standing up.  The patient has continued to have such events and claims that occasionally he will bite his tongue or lose control with the bladder with the events.  The patient has had witnessed events but does not remember that he was ever told what he did when he blacked out.  The patient is unsure about how long he is out when he loses consciousness. The patient does believe that if he sits down quickly he can sometimes avoid a blackout.  The patient notes some dizziness prior to going out. The patient injured his left hand 2 days ago and comes in for an evaluation.  The patient has undergone x-rays that do not show evidence of fracture.  Neurology was asked to see the patient for evaluation of the episodes of syncope versus seizure.  CT of the head shows an area of old stroke involving the right frontal and parietal area, left occipital area.  No acute changes were seen.  PAST MEDICAL HISTORY: 1. History of right internal carotid artery  thrombus, right middle     cerebral artery stroke, posterior cerebral artery stroke in January     2012. 2. ADD. 3. Chronic pain disorder. 4. Hypertension. 5. Pneumothorax with VATS procedure in the past. 6. Renal calculi. 7. Bilateral rotator cuff surgeries. 8. Syncope versus seizure as above.  MEDICATIONS: 1. Adderall 20 mg daily. 2. Fentanyl patch 100 mcg every 48 hours. 3. Coumadin therapy. 4. Baclofen 10 mg q.8 h.  The patient is on a blood pressure pill that he cannot remember the name of.  ALLERGIES:  The patient states no known drug allergies.  SOCIAL HISTORY:  The patient is married, lives in the [place] area.  The patient is on disability and is not working.  The patient continues to smoke about 1/2-pack of cigarettes daily.  Does not drink alcohol.  FAMILY MEDICAL HISTORY:  Mother is still living and has history of heart problems.  Father died with heart disease.  The patient has four brothers, two sisters, one brother died with suicide.  REVIEW OF SYSTEMS:  Notable for  some hot flashes.  The patient notes frequent headaches in the top of the head, bifrontal and temporal regions.  Cloudy vision at times.  Does note chronic shortness of breath, occasional chest pains, some nausea, and no vomiting.  No problems controlling the bowels or bladder.  The patient has had some residual left arm and leg weakness and numbness.  No new changes noted. The patient again reports multiple blackout episodes.  PHYSICAL EXAMINATION:  VITAL SIGNS:  Blood pressure is 161/105, heart rate 88, respiratory rate 20, and temperature afebrile. GENERAL:  This patient is a thin white male who is alert and cooperative at the time examination. HEENT:  Head is atraumatic.  Eyes:  Pupils are equal, round, and reactive to light. NECK:  Supple.  No carotid bruits noted. RESPIRATORY:  Clear. CARDIOVASCULAR:  Regular rate and rhythm.  No obvious murmurs or rubs noted. EXTREMITIES:   Without significant edema. NEUROLOGIC:  Cranial nerves as above.  Facial symmetry is present.  The patient has full extraocular movements.  Visual fields are full.  Speech is normal.  No aphasia or dysarthria noted.  Motor test reveals fairly good strength to direct testing on all fours.  The patient notes a decrease in pinprick sensation and vibration sensation on the left arm, left leg as compared to the right.  The patient has good finger-nose- finger and heel-to-shin.  Gait was not tested.  Deep tendon reflexes are symmetric.  Toes neutral bilaterally.  LABORATORY VALUES:  White count of 11.5, hemoglobin of 13.4, hematocrit 39.8, MCV of 92.6, and platelets of 389.  INR of 1.79.  Sodium 136, potassium 3.4, chloride of 102, CO2 27, glucose of 135, BUN of 14, and creatinine 0.71.  Urinalysis reveals specific gravity of 0.019, pH of 6.5, positive nitrites, 7-10 white cells, 3-6 red cells, and many bacteria.  Urine drug screen is positive for amphetamines, otherwise negative.  IMPRESSION: 1. History of cerebrovascular disease with recent stroke. 2. Syncope versus seizure. 3. Tobacco abuse. 4. Hypertension.  PLAN:  This patient will be admitted for evaluation of the multiple episodes of syncope.  The patient describes events that tend to occur right after he stands up and may represent orthostasis, although he is currently hypertensive.  Need to rule out seizure event if he does report some tongue biting and loss of control of bladder with the events.  The patient will undergo an MRI of the head and MRA of the head and neck and an EEG study.  We will check orthostatic blood pressures during this hospitalization and follow the patient's clinical course while in-house.     Jill Alexanders, M.D.     CKW/MEDQ  D:  02/06/2011  T:  02/07/2011  Job:  [id number]  cc:   Meredith Staggers, M.D. [facility]  Electronically Signed by Roxy Cedar M.D. on 03/12/2011  08:15:18 AM

## 2011-03-12 NOTE — Consult Note (Signed)
[name], [name] NO.:  [id number]   MEDICAL RECORD NO.:  [id number]          PATIENT TYPE:  INP   LOCATION:  2103                         FACILITY:  [facility]   PHYSICIAN:  [name], MD      DATE OF BIRTH:  1961-05-13   DATE OF CONSULTATION:  10/15/2008  DATE OF DISCHARGE:                                 CONSULTATION   REFERRING PHYSICIAN:  Thomes Lolling, MD, from Internal Medicine Teaching  Service.   PRIMARY CARE PHYSICIAN:  [facility] in [place], Dr. Maryland Pink.   REASON FOR CONSULTATION:  Spontaneous pneumothorax.   HISTORY OF PRESENT ILLNESS:  [name] is a 50 year old disabled  gentleman, who smokes 2 packs per day.  Two days ago after an intimate  moment with his wife, he developed sudden onset right-sided chest pain  with coughing enough to bring him to his knees when he got out of bed.  The pain persisted 2 days and he finally came in to the emergency room  on October 14, 2008.  Chest x-ray showed a large pneumothorax with  completely collapsed lung and a chest tube was inserted emergently with  followup film showing full expansion of the lung and subcutaneous  emphysema.  We are asked to consult regarding subsequent management.   He states that his breathing has improved, but reports soreness at the  chest tube site, especially when he takes a deep breath.  He describes  some greenish phlegm which has resolved.  He denies cold or URI symptoms  prior to this occurrence.   PAST MEDICAL HISTORY:  ADHD and rotator cuff surgery x3.  His orthopedic  surgeon is at the [facility].  Lithotripsy less than a year ago.   HOME MEDICATIONS:  1. Adderall 60 mg daily.  2. Lorcet 10/650 mg 5-6 tablets a day.   SOCIAL HISTORY:  He is married and lives with his wife.  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   FAMILY HISTORY:  Two aunts have history of  lung cancer.  A sibling has  schizophrenia.  Father died at age 13 of heart disease.   REVIEW OF SYSTEMS:  [place]ler reports soreness at the chest tube site.  Denies cough or sputum production.   PHYSICAL EXAMINATION:  GENERAL:  An adult gentleman sitting up in bed in  mild respiratory distress.  VITAL SIGNS:  Afebrile, blood pressure 138/98, heart rate 90, oxygen  saturation 92% on room air and 95% on 2 liters nasal cannula.  HEENT:  Normal.  NECK:  Supple.  No JVD.  No lymphadenopathy.  CVS:  S1 and S2 normal.  CHEST:  Some decreased breath sounds on the right in the axillary line.  I could hear air entry posteriorly, crepitus over the right chest wall.  ABDOMEN:  Soft and nontender.  NEUROLOGIC:  Nonfocal.  EXTREMITIES:  No edema.  Chest tube, no air leak.   LABORATORY DATA:  BUN and creatinine 22 and 0.7.  Hemoglobin 13.4  and  WBC count 13.8.  Troponin less than 0.05.   IMAGING STUDIES:  As described above.   IMPRESSION:  1. Spontaneous right pneumothorax related to exertional activity as      described.  2. Likely underlying chronic obstructive pulmonary disease.  3. No clear evidence of bronchitis or bronchospasm at present.   The questioning such as situation is now that the lung has expanded is  whether definitive management of pneumothorax is required in terms of  pleurodesis.  I like to image the underlying lung to look for emphysema  or bulla.  In this heavy smoker, the chances of recurrence of this  spontaneous pneumothorax can be put at about 10%.  If abnormalities are  seen on the CT scan, then the chances of recurrence would be higher and  I would recommend definitive management.   Definitive management will be either in the form of chemical pleurodesis  with talc, doxycycline or if significant bulla present, then he may  require surgical resection of the bulla with mechanical pleurodesis.   These options were discussed with the patient and his wife in great   detail and they evidenced understanding.   A CT of chest has been requested without contrast and importance of  smoking cessation was emphasized in most uncertain terms.      [name], MD  Electronically Signed     RVA/MEDQ  D:  10/15/2008  T:  10/15/2008  Job:  [id number]

## 2011-03-12 NOTE — Discharge Summary (Signed)
[name], [name]                  ACCOUNT NO.:  [id number]   MEDICAL RECORD NO.:  [id number]          PATIENT TYPE:  INP   LOCATION:  3301                         FACILITY:  [facility]   PHYSICIAN:  Jay Schlichter, M.D.  DATE OF BIRTH:  [birth year]   DATE OF ADMISSION:  10/14/2008  DATE OF DISCHARGE:  10/18/2008                               DISCHARGE SUMMARY   DISCHARGE DIAGNOSES:  1. Pneumonia.  2. Spontaneous pneumothorax with 5% right apical pneumothorax at the      time of discharge.  3. Attention deficit hyperactivity disorder.  4. Chronic joint pain secondary to osteoarthritis.  5. Tobacco abuse.   DISCHARGE MEDICATIONS:  1. Adderall 30 mg 1 tablet by mouth twice a day.  2. Avelox 400 mg 1 tablet by mouth daily for 6 days.  3. Vicodin 5/325 mg 2 tablets by mouth every 6 hours as needed.  4. Mucinex 600 mg 1 tablet by mouth every 12 hours.  5. Lorcet 10/650 mg as needed for joint pain.  The patient had been      instructed not to use Vicodin.  6. Nicotine patch 21 mg apply 1 patch on skin every 24 hours for 2      weeks.   DISPOSITION AND FOLLOWUP:  The patient was discharged in stable  condition with optimal oxygen saturation and no shortness of breath,  fever, or cough.  He is going to be followed by Dr. Elsworth Soho, Pulmonologist  on October 19, 2008 at 10:00 a.m.  At that time, another chest x-ray PA  and lateral is going to be rechecked in order to evaluate residual  pneumothorax; it will be also important during this visit to arrange  next followup for pulmonary function tests and to start appropriate  medication for the patient's COPD changes visualized on CT scan.   PROCEDURES PERFORMED DURING THIS ADMISSION:  The patient had on October 14, 2008, a chest x-ray that demonstrated a large right-sided  pneumothorax of approximately 60%.  A chest tube was placed by the ED  physician.  The patient also had CT of the chest without contrast on  October 15, 2008 that  demonstrate left upper lobe and left lower lobe  pneumonia approximately 5-10% residual right apical pneumothorax with  chest tube in place and biapical bullae formation.  Of interest, there  was also a 1.6-cm left adrenal adenoma visualized during these studies.  The patient got a chest x-ray PA and lateral on October 18, 2008 prior  being discharged that demonstrated less than 5% right apical  pneumothorax and moderate subcutaneous free air in the right side of his  chest wall.  There was no pleural effusion or no infiltrate appreciated  from this chest x-ray.  No other procedures were performed.   CONSULTATIONS:  Critical care pulmonologist Dr. Elsworth Soho was consulted for  evaluation of the patient's pneumothorax and also further evaluation and  treatment of the patient's COPD.  CVTS Dr. Lanelle Bal was consulted  for chest tube management and pneumothorax evaluation and management.   BRIEF HISTORY OF PRESENT ILLNESS, VITAL SIGNS,  AND LABS AT THE MOMENT OF  ADMISSION:  [name] is a 50 year old white male with past medical  history significant of tobacco abuse who presented with chest pain 10/10  related to deep inspiration and position.  The pain was sharp, localized  on the right side of his chest, not relieved by anything; it started  approximately 3 days ago after the patient fell out of bed when he tried  to spring up secondary to cramp in his leg.  The pain continued and  increased over last 3 days and today, he became concerned enough to come  to the emergency department.  The patient has been having mild shortness  of breath and coughing a lot previously and more after falling out of  bed.  There is no history of fever or chills.  The patient had  associated greenish sputum production.  Of note, the patient has smoked  2-pack per day for about 30 years.  After this admission and after  experiencing all the consequences of smoking, the patient is now  determined to quit.   At  the time of admission, the patient's vital signs; temperature 98.0,  blood pressure 142/103, heart rate 93, respiratory rate 17, and oxygen  saturation 91% on room air.   The patient had sodium of 138, potassium 3.8, chloride 105, bicarb 25,  BUN 22, creatinine 0.27, blood sugar 116.  The patient's cardiac  markers; myoglobin 161, CK-MB 13.5, troponin less than 0.05.  Chest x-  ray demonstrated right-sided pneumothorax more than 60% of his lungs  affected, have AST of 31, ALT 18, protein 6.8, calcium 9.3, lipase 18,  white blood cells 13.8, platelet 340, hemoglobin 13.4, MCV 97.5, ANC  11.1.   HOSPITAL COURSE:  1. Pneumothorax.  A right chest tube was placed in the ED by the ED      physician; a follow-up CXR showed almost complete resolution of the      pneumothorax after chest tube placement.  CVTS was consulted for      chest tube management and followed the patient during his hospital      stay.  The chest tube was removed on December 21, and a follow-up      CXR on December 22 showed a 5% apical pneumothorax.  The patient is      going to follow with Dr. Elsworth Soho, pulmonologist on October 19, 2008      at 10 a.m. in order to reevaluate chest x-ray for complete      resolution of his residual pneumothorax.  The patient had been      instructed to stop smoking and is going to arrange future followup      with Dr. Elsworth Soho for formal PFTs and treatment for his COPD changes      appreciated on CT scan.  2. Pneumonia/bronchitis.  The patient had increased white blood cells      at the time of admission as well as coughing with greenish sputum      production.  The patient also had CT changes consistent with upper      and lower left lobe pneumonia.  At time of discharge, white blood      cells were in normal range, and no fevers.  The patient is going to      continue Avelox for 6 more days for a total antibiotic treatment of      10 days.  He will also use of Mucinex 600 mg twice a  day.  3.  Tobacco abuse.  The patient received during this admission formal      smoking cessation counseling as well as resources as an outpatient      to continue working on his cessation.  He is completely determined      to quit and was provided with prescription for nicotine patch in      order to use it for 2 weeks while he achieved his goals.  4. ADHD.  The patient is going to continue using Adderall and is going      to continue follow with primary care physician for that condition.  5. Chronic joint pain.  Plan is the patient will continue using Lorcet      10/650 mg as needed for pain as prescribed by primary care      physician.  We will continue to following him as well for that      condition.   At the time of at discharge, the patient's vital signs; temperature  98.1, blood pressure 137/70, heart rate 70, respiratory rate 14, and  oxygen saturation 98% on room air.  The patient had a white blood cell  5.7, hemoglobin 13.8, platelets of 347, sodium 137, potassium 3.9,  chloride 102, bicarb 27, BUN 17, creatinine 0.7, blood sugar 104.      Julieta Bellini, MD       Jay Schlichter, M.D.  Electronically Signed    CEM/MEDQ  D:  10/18/2008  T:  10/19/2008  Job:  [id number]   cc:   [name], MD

## 2011-03-12 NOTE — Op Note (Signed)
[name], [name]                  ACCOUNT NO.:  [id number]   MEDICAL RECORD NO.:  [id number]          PATIENT TYPE:  INP   LOCATION:  2312                         FACILITY:  [facility]   PHYSICIAN:  Robert Bal, MD    DATE OF BIRTH:  May 08, 1961   DATE OF PROCEDURE:  11/22/2008  DATE OF DISCHARGE:                               OPERATIVE REPORT   PREOPERATIVE DIAGNOSIS:  Recurrent right spontaneous pneumothorax with  apical blebs.   POSTOPERATIVE DIAGNOSIS:  Recurrent right spontaneous pneumothorax with  apical blebs.   SURGICAL PROCEDURES:  Right video-assisted thoracoscopy with stapling of  apical blebs and mechanical pleural scarification.   SURGEON:  Robert Bal, MD   FIRST ASSISTANT:  [name], [place]   BRIEF HISTORY:  The patient is a 50 year old male with long smoking  history and chronic disability for shoulder pain, who presented in  December 2009 with spontaneous right pneumothorax.  A chest tube was  placed at that time and ultimately was removed without evidence of  pneumothorax of continued air leak.  The patient did well, stopped  smoking, and was discharged home.  Followup film 2 weeks ago showed full  inflation of the lungs.  He presented again with sudden onset of right  chest pain and a large right pneumothorax yesterday.  A chest tube was  placed and it was recommended to him that we proceed with right video-  assisted thoracoscopy and he previously had a CT scan of the chest on  his last admission, which demonstrated bilateral apical blebs.  The  patient agreed to surgery and signed informed consent.   DESCRIPTION OF PROCEDURE:  The patient underwent general endotracheal  anesthesia.  A double-lumen endotracheal tube was placed through the  endotracheal tube.  We passed a applied fiberoptic bronchoscope looking  in both right and left lungs to the subsegmental level.  There was no  evidence of endobronchial lesions.  The scope was removed.  The  patient  was turned to the lateral decubitus position with the right side up.  The previously placed chest tube was removed.  The skin of the chest was  prepped with Betadine and draped in the usual sterile manner.  Initially, an decision was made in approximately the fourth intercostal  space, midaxillary line.  Through this, a trocar was placed and the  scope was introduced into the chest.  A very large more than 2-cm apical  bleb was identified primarily and associated with this were several  smaller blebs.  Additional ports, anterior and posterior, higher on the  chest, were then placed through the anterior and posterior ports.  Graspers were used to grasp along and an Ethilon 60 stapler was used to  staple across the base of the collection of apical blebs.  The blebs  were resected and submitted to pathology.  After completion of the  stapling, the head was put in the down position, saline was instilled  into the chest, and the right lung inflated slightly.  There was no  evidence of air leak from the apex.  Saline was removed  further.  The  lung was inspected with the scope and no other blebs or obvious source  of air leak were identified.  Through the anterior and posterior  incisions, a Bovie scratch pad was used for mechanical scarification of  the apical pleural surface.  Through the mid axillary line, a 28 chest  tube was placed, its position was confirmed and it was secured in place.  The anterior and posterior ports were then closed in deep layer with  interrupted Vicryl suture and  subcuticular stitch in the skin edges.  The old chest tube site was  closed with a single silk suture.  The chest tube was hooked to water  seal and the lung was reinflated without any evidence of continued air  leak.  Dry dressings were applied.  Sponge and needle count was reported  as correct at the completion of procedure.  Blood loss was minimal.      Robert Bal, MD  Electronically  Signed     EG/MEDQ  D:  11/22/2008  T:  11/23/2008  Job:  [id number]   cc:   Robert Noel, MD

## 2011-03-12 NOTE — Assessment & Plan Note (Signed)
OFFICE VISIT   SPENSER, MOCZYGEMBA  DOB:  06-23-1961                                        December 19, 2008  CHART #:  [id number]   HISTORY OF PRESENTING ILLNESS:  This is a 50 year old Caucasian male who  is status post right VATS, stapling of apical blebs, and mechanical  pleural scarification by Dr. Servando Snare on 11/22/2008.  He was last seen  in the office on 12/09/2008 by Dr. Prescott Gum at which time, he was  having persistent pain in his right chest (postoperative pain likely  from the pleurectomy) and he had run out of pain medication.  He was  given prescriptions for both Percocet and Ultram.  The patient presented  to the office today with increased pain at night in the right chest as  well as some radiation to both arms.  The patient states that the pain  radiation to both arms has been occurring since his last visit here.  He  denies any chest pain or heaviness.  Denies any short of breath,  weakness, or numbness in the extremities.  Denies any diaphoresis,  nauseousness, or vomiting.  He states that the pain is fairly well  controlled during the day with the Percocet and Ultram; however, seemed  to worsen at night while he is supine.   PHYSICAL EXAMINATION:  GENERAL:  This is a 50 year old Caucasian male  who is in no acute distress who is alert, oriented, and cooperative.  VITAL SIGNS:  BP 147/89, pulse rate 82, respirations 18, and O2 sat 98%  on room air.  CARDIAC:  Regular rate and rhythm.  LUNGS:  Clear to auscultation bilaterally.  No rales, wheezes, or  rhonchi.  Right chest wounds are well healed.  No erythema.  No signs of  infection.   Chest x-ray done today shows no pneumothorax.  Right apical pleural  thickening present.  No effusion.   IMPRESSION AND PLAN:  Persistent pain in the right chest (likely  postoperative).  The patient was given prescription refills for Percocet  7.5/325 one to two p.o. q.4 h. p.r.n. pain and Ultram 50 mg  q.6 h.  p.r.n. pain.  In addition, he was instructed to take nonsteroidal (i.e.  Aleve, ibuprofen).  The patient will return to the office in about 6  weeks with a chest x-ray.  He was instructed to contact the office in  the interim.  The patient was also instructed if he has any chest  heaviness, chest pain, weakness, or numbness in the extremities, pain  and numbness in the jaw, diaphoresis, nauseous or vomiting, etc.  He is  to seek immediate medical attention.  Finally, the patient was  encouraged again to continue with his smoking cessation.   Lanelle Bal, MD  Electronically Signed   DZ/MEDQ  D:  12/19/2008  T:  12/19/2008  Job:  [id number]   cc:   [place] Pink

## 2011-03-12 NOTE — Discharge Summary (Signed)
[name], [name]                  ACCOUNT NO.:  [id number]   MEDICAL RECORD NO.:  [id number]           PATIENT TYPE:   LOCATION:                                 FACILITY:   PHYSICIAN:  Lanelle Bal, MD    DATE OF BIRTH:  22-Feb-1961   DATE OF ADMISSION:  DATE OF DISCHARGE:                               DISCHARGE SUMMARY   FINAL DIAGNOSIS:  Recurrent right spontaneous pneumothorax with apical  blebs.   SECONDARY DIAGNOSES:  1. Attention deficit disorder.  2. Chronic joint pain secondary to osteoarthritis.  3. Tobacco abuse.  4. Shoulder surgery.   IN-HOSPITAL OPERATIONS AND PROCEDURES:  Right video-assisted  thoracoscopic surgery with stapling of apical blebs and mechanical  pleurodesis.   HISTORY AND PHYSICAL AND HOSPITAL COURSE:  The patient is a 50 year old  male with long smoking history and chronic disability for shoulder pain,  who presented in December 2009 with spontaneous right pneumothorax.  A  chest tube was placed at that time, and ultimately, it was removed  without evidence of pneumothorax or continued air leak.  The patient did  well, stopped smoking, and was discharged to home.  Followup 2 weeks ago  showed full inflation of the lungs.  He presented again with sudden  onset of right chest pain and large right pneumothorax on November 20, 2008.  A chest tube was placed and it was recommended to him that we  proceed with right video-assisted thoracoscopic surgery.  The patient  was seen and evaluated by Dr. Servando Snare.  Dr. Servando Snare discussed with the  patient due to his recent CT scan showing bilateral apical blebs that  this would be the best way to proceed doing a lobectomy.  He discussed  risks and benefits with the patient.  The patient acknowledged his  understanding and agreed to proceed.  Surgery was scheduled for November 16, 2008.  The patient remained stable preoperatively.  For details of  the patient's past medical history and physical exam, please  see  dictated H&P.   The patient was taken to the operating room on November 22, 2008, where  he underwent right video-assisted thoracoscopic surgery with stapling of  apical blebs and mechanical pleurodesis.  The patient tolerated this  procedure and was transferred to the intensive care unit in stable  condition.  The patient's postoperative course was pretty much  unremarkable.  Daily chest x-rays obtained.  No pneumothorax noted.  No  air leak noted from the patient's Pleur-evac.  Chest tube was placed on  postop day 1 and placed on water seal and discontinued on postop day 2  without difficulty.  Followup chest x-ray today, November 25, 2008,  showed no pneumothorax noted.  The patient was able to be weaned off  oxygen with O2 sats greater than 90% on room air.  Dr. Elsworth Soho did follow  the patient from a respiratory standpoint during his postoperative  course.  Postoperatively, the patient was up, ambulating well without  difficulty.  He was tolerating diet well.  No nausea or vomiting noted.  His vital signs were  monitored postoperatively and noted to be stable.  He remained in normal sinus rhythm.  Blood pressure stable.  All  incisions noted to be clean, dry, and intact and healing well.   Today, November 25, 2008, postop day 3, the patient felt to be stable and  ready for discharge home.   FOLLOWUP APPOINTMENTS:  Followup appointment will be arranged with Dr.  Servando Snare for him in 3 weeks.  Our office will contact the patient with  this information.  The patient will need to obtain PA and lateral chest  x-ray 30 minutes prior to this appointment.  The patient will have a  followup appointment with the nurse in 1 week for suture removal.  Our  office will contact the patient with this information.  The patient will  need to follow up with Dr. Elsworth Soho as directed.   ACTIVITY:  The patient is instructed no driving until released to do so,  no lifting over 10 pounds.  He is told to  ambulate 3-4 times per day,  progress as tolerated, and continue his breathing exercises.   INCISIONAL CARE:  The patient is told to shower, washing his incisions  using soap and water.  He is to contact the office if he develops any  drainage or opening from any of his incision sites.   DIET:  The patient is educated on diet to be low fat, low salt.   DISCHARGE MEDICATIONS:  1. Adderall 30 mg b.i.d.  2. Percocet 5/325 one to two tablets q.4-6 h. p.r.n.  3. Lorcet 10/650 one to two tablets p.r.n.      Darlin Coco, [place]      Lanelle Bal, MD  Electronically Signed    KMD/MEDQ  D:  11/25/2008  T:  11/26/2008  Job:  [id number]   cc:   [name], MD

## 2011-03-12 NOTE — Assessment & Plan Note (Signed)
OFFICE VISIT   KEYONE, SCHRAGE D  DOB:  17-Feb-1961                                        December 09, 2008  CHART #:  [id number]   CURRENT PROBLEMS:  1. Status post right VATS, stapling of bleb and pleurodesis for      spontaneous pneumothorax on November 22, 2008.  2. Postoperative chest wall pain.   PRESENT ILLNESS:  The patient presents to the office due to persistent  pain in his right chest after the above procedure and after having run  out of his discharge pain medication (Percocet).  He has stopped  smoking.  He denies productive cough, fever, or shortness of breath.  He  has diffuse pain worse when supine.  This sounds like postoperative pain  and probably from the pleurectomy.  The incisions are well healed and a  followup chest x-ray today shows clear lung fields.  No pleural  effusion.  No pneumothorax.   PHYSICAL EXAMINATION:  VITAL SIGNS:  Blood pressure 140/80, pulse 80,  respirations 18, and saturation 94%.  LUNGS:  Breath sounds are distant, but clear.  CHEST:  The thoracotomy and chest tube incisions are healed.  CARDIAC:  Rhythm is regular.   PLAN:  The patient was given a refill for Percocet 7.5/350 one to two  p.o. q.4 h. p.r.n. pain as well as Ultram 50 mg q.6 h. p.r.n. pain.  He  will keep his appointment with Dr. Servando Snare later this month.   Ivin Poot, M.D.  Electronically Signed   PV/MEDQ  D:  12/09/2008  T:  12/10/2008  Job:  [id number]

## 2011-03-24 ENCOUNTER — Emergency Department (HOSPITAL_COMMUNITY): Payer: Medicaid Other

## 2011-03-24 ENCOUNTER — Inpatient Hospital Stay (HOSPITAL_COMMUNITY)
Admission: EM | Admit: 2011-03-24 | Discharge: 2011-03-27 | DRG: 917 | Disposition: A | Discharge status: Home / Self Care | Payer: Medicaid Other | Attending: Internal Medicine | Admitting: Internal Medicine

## 2011-03-24 DIAGNOSIS — E860 Dehydration: Secondary | ICD-10-CM | POA: Diagnosis present

## 2011-03-24 DIAGNOSIS — N179 Acute kidney failure, unspecified: Secondary | ICD-10-CM | POA: Diagnosis present

## 2011-03-24 DIAGNOSIS — M6282 Rhabdomyolysis: Secondary | ICD-10-CM | POA: Diagnosis present

## 2011-03-24 DIAGNOSIS — R918 Other nonspecific abnormal finding of lung field: Secondary | ICD-10-CM | POA: Diagnosis present

## 2011-03-24 DIAGNOSIS — N289 Disorder of kidney and ureter, unspecified: Secondary | ICD-10-CM | POA: Diagnosis present

## 2011-03-24 DIAGNOSIS — G9349 Other encephalopathy: Secondary | ICD-10-CM | POA: Diagnosis present

## 2011-03-24 DIAGNOSIS — Z8673 Personal history of transient ischemic attack (TIA), and cerebral infarction without residual deficits: Secondary | ICD-10-CM

## 2011-03-24 DIAGNOSIS — T50991A Poisoning by other drugs, medicaments and biological substances, accidental (unintentional), initial encounter: Principal | ICD-10-CM | POA: Diagnosis present

## 2011-03-24 DIAGNOSIS — F988 Other specified behavioral and emotional disorders with onset usually occurring in childhood and adolescence: Secondary | ICD-10-CM | POA: Diagnosis present

## 2011-03-24 LAB — DIFFERENTIAL
Basophils Absolute: 0 10*3/uL (ref 0.0–0.1)
Basophils Relative: 0 % (ref 0–1)
Eosinophils Absolute: 0.4 10*3/uL (ref 0.0–0.7)
Monocytes Absolute: 1.1 10*3/uL — ABNORMAL HIGH (ref 0.1–1.0)
Neutrophils Relative %: 88 % — ABNORMAL HIGH (ref 43–77)

## 2011-03-24 LAB — URINALYSIS, ROUTINE W REFLEX MICROSCOPIC
Bilirubin Urine: NEGATIVE
Ketones, ur: NEGATIVE mg/dL
Leukocytes, UA: NEGATIVE
Nitrite: NEGATIVE
Protein, ur: NEGATIVE mg/dL
Urobilinogen, UA: 0.2 mg/dL (ref 0.0–1.0)
pH: 5.5 (ref 5.0–8.0)

## 2011-03-24 LAB — ETHANOL: Alcohol, Ethyl (B): <11 mg/dL — ABNORMAL HIGH (ref 0–10)

## 2011-03-24 LAB — RAPID URINE DRUG SCREEN, HOSP PERFORMED
Amphetamines: POSITIVE — AB
Benzodiazepines: NOT DETECTED
Cocaine: NOT DETECTED

## 2011-03-24 LAB — PROTIME-INR
INR: 1.06 (ref 0.00–1.49)
Prothrombin Time: 14 seconds (ref 11.6–15.2)

## 2011-03-24 LAB — URINE MICROSCOPIC-ADD ON

## 2011-03-24 LAB — COMPREHENSIVE METABOLIC PANEL
Albumin: 3.7 g/dL (ref 3.5–5.2)
BUN: 25 mg/dL — ABNORMAL HIGH (ref 6–23)
Calcium: 10 mg/dL (ref 8.4–10.5)
Glucose, Bld: 134 mg/dL — ABNORMAL HIGH (ref 70–99)
Sodium: 141 mEq/L (ref 135–145)
Total Protein: 7.5 g/dL (ref 6.0–8.3)

## 2011-03-24 LAB — CK TOTAL AND CKMB (NOT AT ARMC)
CK, MB: 24 ng/mL (ref 0.3–4.0)
Total CK: 2157 U/L — ABNORMAL HIGH (ref 7–232)

## 2011-03-24 LAB — CBC
MCH: 31.4 pg (ref 26.0–34.0)
MCHC: 33.2 g/dL (ref 30.0–36.0)
Platelets: 408 10*3/uL — ABNORMAL HIGH (ref 150–400)
RBC: 4.08 MIL/uL — ABNORMAL LOW (ref 4.22–5.81)

## 2011-03-24 LAB — PROCALCITONIN: Procalcitonin: 0.56 ng/mL

## 2011-03-24 LAB — LACTIC ACID, PLASMA: Lactic Acid, Venous: 0.6 mmol/L (ref 0.5–2.2)

## 2011-03-24 LAB — TROPONIN I: Troponin I: <0.3 ng/mL (ref <0.30)

## 2011-03-25 ENCOUNTER — Inpatient Hospital Stay (HOSPITAL_COMMUNITY): Payer: Medicaid Other

## 2011-03-25 LAB — CARDIAC PANEL(CRET KIN+CKTOT+MB+TROPI)
CK, MB: 54.8 ng/mL (ref 0.3–4.0)
CK, MB: 58 ng/mL (ref 0.3–4.0)
Total CK: 5869 U/L — ABNORMAL HIGH (ref 7–232)
Total CK: 5329 U/L — ABNORMAL HIGH (ref 7–232)
Troponin I: <0.3 ng/mL (ref <0.30)

## 2011-03-25 LAB — DIFFERENTIAL
Basophils Relative: 0 % (ref 0–1)
Lymphocytes Relative: 11 % — ABNORMAL LOW (ref 12–46)
Lymphs Abs: 1.4 10*3/uL (ref 0.7–4.0)
Monocytes Absolute: 0.9 10*3/uL (ref 0.1–1.0)
Monocytes Relative: 7 % (ref 3–12)
Neutro Abs: 10.5 10*3/uL — ABNORMAL HIGH (ref 1.7–7.7)
Neutrophils Relative %: 82 % — ABNORMAL HIGH (ref 43–77)

## 2011-03-25 LAB — BASIC METABOLIC PANEL
CO2: 23 mEq/L (ref 19–32)
Calcium: 9.2 mg/dL (ref 8.4–10.5)
Chloride: 111 mEq/L (ref 96–112)
Creatinine, Ser: 1.04 mg/dL (ref 0.4–1.5)
Glucose, Bld: 112 mg/dL — ABNORMAL HIGH (ref 70–99)

## 2011-03-25 LAB — CBC
HCT: 37 % — ABNORMAL LOW (ref 39.0–52.0)
Hemoglobin: 12.1 g/dL — ABNORMAL LOW (ref 13.0–17.0)
MCH: 31.1 pg (ref 26.0–34.0)
MCHC: 32.7 g/dL (ref 30.0–36.0)
MCV: 95.1 fL (ref 78.0–100.0)
RBC: 3.89 MIL/uL — ABNORMAL LOW (ref 4.22–5.81)

## 2011-03-26 LAB — BASIC METABOLIC PANEL
CO2: 22 mEq/L (ref 19–32)
Calcium: 9.6 mg/dL (ref 8.4–10.5)
Creatinine, Ser: 0.65 mg/dL (ref 0.4–1.5)
GFR calc Af Amer: >60 mL/min (ref >60)
GFR calc non Af Amer: >60 mL/min (ref >60)
Glucose, Bld: 118 mg/dL — ABNORMAL HIGH (ref 70–99)
Sodium: 135 mEq/L (ref 135–145)

## 2011-03-26 LAB — URINE CULTURE: Culture: NO GROWTH
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
## 2011-04-01 LAB — CULTURE, BLOOD (ROUTINE X 2)
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
## 2011-04-08 ENCOUNTER — Ambulatory Visit: Payer: Medicaid Other | Admitting: Physical Medicine & Rehabilitation

## 2011-04-19 ENCOUNTER — Encounter: Payer: Medicaid Other | Attending: Physical Medicine & Rehabilitation | Admitting: Physical Medicine & Rehabilitation

## 2011-04-19 DIAGNOSIS — IMO0001 Reserved for inherently not codable concepts without codable children: Secondary | ICD-10-CM

## 2011-04-19 DIAGNOSIS — F1910 Other psychoactive substance abuse, uncomplicated: Secondary | ICD-10-CM | POA: Insufficient documentation

## 2011-04-19 DIAGNOSIS — I63239 Cerebral infarction due to unspecified occlusion or stenosis of unspecified carotid arteries: Secondary | ICD-10-CM

## 2011-04-19 DIAGNOSIS — G894 Chronic pain syndrome: Secondary | ICD-10-CM | POA: Insufficient documentation

## 2011-04-19 DIAGNOSIS — M12519 Traumatic arthropathy, unspecified shoulder: Secondary | ICD-10-CM

## 2011-04-19 DIAGNOSIS — F1920 Other psychoactive substance dependence, uncomplicated: Secondary | ICD-10-CM

## 2011-04-20 NOTE — Assessment & Plan Note (Signed)
[name] is back regarding his pain syndrome.  I was called by the Hospitalist at [facility] 2 weeks ago approximately, where [name] was admitted again after being found unresponsive.  She told me that this patient was drinking again per family.  [name] denied that with me today.  I warned [name] that if any suspicious activity occurred that we would wean him off medications and he would be discharge from this clinic.  ASSESSMENT:  Chronic pain syndrome with history of substance abuse.  PLAN: 1. We began a wean of his fentanyl patch starting at 75 mcg q.48 hours     for 5 patches, then down to 50 mcg q.48 hours x10 patches. 2. He will return to this office to see my nurse practitioner in     approximately 4 weeks for further taper of this medication going to     a 25 mcg patch for 20 days, then off. 3. I did not refill his Adderall. 4. The patient thanked me for my help and was polite, did not argue     with my decision.  After the visit with my nurse practitioner, he     will not return to this clinic.     Meredith Staggers, M.D. Electronically Signed    ZTS/MedQ D:  04/19/2011 13:19:43  T:  04/20/2011 01:43:02  Job #:  [id number]

## 2011-05-17 ENCOUNTER — Encounter: Payer: Medicaid Other | Attending: Neurosurgery | Admitting: Neurosurgery

## 2011-05-17 DIAGNOSIS — M25559 Pain in unspecified hip: Secondary | ICD-10-CM

## 2011-05-17 DIAGNOSIS — M25529 Pain in unspecified elbow: Secondary | ICD-10-CM

## 2011-05-17 DIAGNOSIS — M545 Low back pain: Secondary | ICD-10-CM

## 2011-05-17 DIAGNOSIS — F1910 Other psychoactive substance abuse, uncomplicated: Secondary | ICD-10-CM | POA: Insufficient documentation

## 2011-05-17 DIAGNOSIS — G894 Chronic pain syndrome: Secondary | ICD-10-CM | POA: Insufficient documentation

## 2011-05-18 NOTE — Assessment & Plan Note (Signed)
Mr. Strosnider is patient of Dr. Naaman Plummer, who is being discharged from the clinic due to substance abuse.  We have tapered him off his fentanyl. He did request to be left on the program.  I saw, Dr. Naaman Plummer about his past behaviors, and he decided to discharge the patient from the clinic.  PHYSICAL EXAMINATION:  VITAL SIGNS:  Today, his blood pressure is 110/75, his pulse is 71, respirations 18, O2 sats 97. MUSCULOSKELETAL:  Motor strength was good. NEUROLOGIC:  Constitutionally, he is within normal limits.  His affect is somewhat depressed and anxious.  His gait was normal.  His sensation is intact.  PLAN:  We will give the patient one final prescription of fentanyl patch 25 mcg transdermally every 48 hours for 20 days, no refills.  He will not follow up in this clinic.  He stated understanding.     Addaline Peplinski L. Blenda Nicely Electronically Signed    RLW/MedQ D:  05/17/2011 14:03:56  T:  05/18/2011 01:42:01  Job #:  [id number]

## 2011-08-02 LAB — CBC
HCT: 38.5 % — ABNORMAL LOW (ref 39.0–52.0)
Hemoglobin: 13.4 g/dL (ref 13.0–17.0)
Hemoglobin: 13.8 g/dL (ref 13.0–17.0)
MCV: 96.4 fL (ref 78.0–100.0)
Platelets: 293 10*3/uL (ref 150–400)
RBC: 4.08 MIL/uL — ABNORMAL LOW (ref 4.22–5.81)
RBC: 4.17 MIL/uL — ABNORMAL LOW (ref 4.22–5.81)
RDW: 13 % (ref 11.5–15.5)
WBC: 13.8 10*3/uL — ABNORMAL HIGH (ref 4.0–10.5)
WBC: 5.7 10*3/uL (ref 4.0–10.5)
WBC: 8.3 10*3/uL (ref 4.0–10.5)

## 2011-08-02 LAB — CARDIAC PANEL(CRET KIN+CKTOT+MB+TROPI)
CK, MB: 9.6 ng/mL — ABNORMAL HIGH (ref 0.3–4.0)
Relative Index: 1.5 (ref 0.0–2.5)
Relative Index: 1.7 (ref 0.0–2.5)
Total CK: 566 U/L — ABNORMAL HIGH (ref 7–232)
Troponin I: <0.01 ng/mL (ref 0.00–0.06)
Troponin I: <0.01 ng/mL (ref 0.00–0.06)

## 2011-08-02 LAB — LIPID PANEL
Cholesterol: 149 mg/dL (ref 0–200)
HDL: 58 mg/dL (ref >39)
LDL Cholesterol: 73 mg/dL (ref 0–99)
Triglycerides: 91 mg/dL (ref <150)

## 2011-08-02 LAB — COMPREHENSIVE METABOLIC PANEL
ALT: 18 U/L (ref 0–53)
AST: 31 U/L (ref 0–37)
Alkaline Phosphatase: 77 U/L (ref 39–117)
CO2: 25 mEq/L (ref 19–32)
Chloride: 105 mEq/L (ref 96–112)
GFR calc Af Amer: >60 mL/min (ref >60)
GFR calc non Af Amer: >60 mL/min (ref >60)
Glucose, Bld: 116 mg/dL — ABNORMAL HIGH (ref 70–99)
Sodium: 138 mEq/L (ref 135–145)
Total Bilirubin: 0.5 mg/dL (ref 0.3–1.2)

## 2011-08-02 LAB — BASIC METABOLIC PANEL
Calcium: 9.1 mg/dL (ref 8.4–10.5)
Chloride: 102 mEq/L (ref 96–112)
Chloride: 102 mEq/L (ref 96–112)
Creatinine, Ser: 0.7 mg/dL (ref 0.4–1.5)
GFR calc Af Amer: >60 mL/min (ref >60)
GFR calc Af Amer: >60 mL/min (ref >60)
GFR calc non Af Amer: >60 mL/min (ref >60)
Potassium: 3.8 mEq/L (ref 3.5–5.1)
Sodium: 134 mEq/L — ABNORMAL LOW (ref 135–145)
Sodium: 137 mEq/L (ref 135–145)

## 2011-08-02 LAB — POCT CARDIAC MARKERS: Troponin i, poc: <0.05 ng/mL (ref 0.00–0.09)

## 2011-08-02 LAB — DIFFERENTIAL
Basophils Absolute: 0 10*3/uL (ref 0.0–0.1)
Basophils Relative: 0 % (ref 0–1)
Eosinophils Absolute: 0.3 10*3/uL (ref 0.0–0.7)
Eosinophils Relative: 2 % (ref 0–5)
Neutrophils Relative %: 80 % — ABNORMAL HIGH (ref 43–77)

## 2011-08-02 LAB — GLUCOSE, CAPILLARY: Glucose-Capillary: 124 mg/dL — ABNORMAL HIGH (ref 70–99)

## 2011-08-02 LAB — RAPID URINE DRUG SCREEN, HOSP PERFORMED: Tetrahydrocannabinol: POSITIVE — AB

## 2011-08-02 LAB — LIPASE, BLOOD: Lipase: 18 U/L (ref 11–59)

## 2011-08-02 LAB — TROPONIN I: Troponin I: 0.04 ng/mL (ref 0.00–0.06)

## 2012-01-02 ENCOUNTER — Other Ambulatory Visit: Payer: Self-pay

## 2012-01-02 ENCOUNTER — Emergency Department (HOSPITAL_COMMUNITY): Payer: Medicare Other

## 2012-01-02 ENCOUNTER — Encounter (HOSPITAL_COMMUNITY): Payer: Self-pay | Admitting: *Deleted

## 2012-01-02 ENCOUNTER — Emergency Department (HOSPITAL_COMMUNITY)
Admission: EM | Admit: 2012-01-02 | Discharge: 2012-01-03 | Disposition: A | Discharge status: Home / Self Care | Payer: Medicare Other | Attending: Emergency Medicine | Admitting: Emergency Medicine

## 2012-01-02 DIAGNOSIS — Z8673 Personal history of transient ischemic attack (TIA), and cerebral infarction without residual deficits: Secondary | ICD-10-CM | POA: Insufficient documentation

## 2012-01-02 DIAGNOSIS — R05 Cough: Secondary | ICD-10-CM | POA: Insufficient documentation

## 2012-01-02 DIAGNOSIS — R079 Chest pain, unspecified: Secondary | ICD-10-CM | POA: Insufficient documentation

## 2012-01-02 DIAGNOSIS — J209 Acute bronchitis, unspecified: Secondary | ICD-10-CM

## 2012-01-02 DIAGNOSIS — F988 Other specified behavioral and emotional disorders with onset usually occurring in childhood and adolescence: Secondary | ICD-10-CM | POA: Insufficient documentation

## 2012-01-02 DIAGNOSIS — I10 Essential (primary) hypertension: Secondary | ICD-10-CM | POA: Insufficient documentation

## 2012-01-02 DIAGNOSIS — F17200 Nicotine dependence, unspecified, uncomplicated: Secondary | ICD-10-CM | POA: Insufficient documentation

## 2012-01-02 DIAGNOSIS — R062 Wheezing: Secondary | ICD-10-CM | POA: Insufficient documentation

## 2012-01-02 DIAGNOSIS — R059 Cough, unspecified: Secondary | ICD-10-CM | POA: Insufficient documentation

## 2012-01-02 DIAGNOSIS — M549 Dorsalgia, unspecified: Secondary | ICD-10-CM | POA: Insufficient documentation

## 2012-01-02 DIAGNOSIS — Z79899 Other long term (current) drug therapy: Secondary | ICD-10-CM | POA: Insufficient documentation

## 2012-01-02 DIAGNOSIS — R0602 Shortness of breath: Secondary | ICD-10-CM | POA: Insufficient documentation

## 2012-01-02 HISTORY — DX: Pneumothorax, unspecified: J93.9

## 2012-01-02 MED ORDER — KETOROLAC TROMETHAMINE 30 MG/ML IJ SOLN: 30.0000 mg | Freq: Once | INTRAMUSCULAR | Status: DC

## 2012-01-02 MED ORDER — ONDANSETRON 4 MG PO TBDP
4.0000 mg | ORAL_TABLET | Freq: Once | ORAL | Status: AC
  Administered 2012-01-02: 4 mg via ORAL
  Filled 2012-01-02: qty 1

## 2012-01-02 MED ORDER — PREDNISONE 20 MG PO TABS
60.0000 mg | ORAL_TABLET | Freq: Once | ORAL | Status: AC
  Administered 2012-01-02: 60 mg via ORAL
  Filled 2012-01-02: qty 3

## 2012-01-02 MED ORDER — IPRATROPIUM BROMIDE 0.02 % IN SOLN
0.5000 mg | Freq: Once | RESPIRATORY_TRACT | Status: AC
  Administered 2012-01-02: 0.5 mg via RESPIRATORY_TRACT
  Filled 2012-01-02: qty 2.5

## 2012-01-02 MED ORDER — ALBUTEROL SULFATE (5 MG/ML) 0.5% IN NEBU
5.0000 mg | INHALATION_SOLUTION | Freq: Once | RESPIRATORY_TRACT | Status: AC
  Administered 2012-01-02: 5 mg via RESPIRATORY_TRACT
  Filled 2012-01-02: qty 1

## 2012-01-02 MED ORDER — MORPHINE SULFATE 4 MG/ML IJ SOLN
4.0000 mg | Freq: Once | INTRAMUSCULAR | Status: AC
  Administered 2012-01-02: 4 mg via INTRAVENOUS
  Filled 2012-01-02: qty 1

## 2012-01-02 NOTE — ED Provider Notes (Signed)
Pt complains of sob and upper back pain.  pe moderate wheezing.    After neb tx the pt improved.  Dx bronchospasm.  Medical screening examination/treatment/procedure(s) were conducted as a shared visit with non-physician practitioner(s) and myself.  I personally evaluated the patient during the encounter   Maudry Diego, MD 01/02/12 2318

## 2012-01-02 NOTE — ED Notes (Signed)
Pt thinks his lung may have collapsed, hx of pneumothorax due to "years of smoking" per pt, productive cough for last few days

## 2012-01-03 MED ORDER — PREDNISONE 20 MG PO TABS: 60.0000 mg | ORAL_TABLET | Freq: Every day | ORAL | Status: DC

## 2012-01-03 MED ORDER — HYDROCODONE-ACETAMINOPHEN 5-325 MG PO TABS: 1.0000 | ORAL_TABLET | ORAL | Status: AC | PRN

## 2012-01-03 MED ORDER — ALBUTEROL SULFATE HFA 108 (90 BASE) MCG/ACT IN AERS
2.0000 | INHALATION_SPRAY | Freq: Once | RESPIRATORY_TRACT | Status: AC
  Administered 2012-01-03: 2 via RESPIRATORY_TRACT
  Filled 2012-01-03: qty 6.7

## 2012-01-03 NOTE — Discharge Instructions (Signed)
Acute Bronchitis You have acute bronchitis. This means you have a chest cold. The airways in your lungs are red and sore (inflamed). Acute means it is sudden onset.  CAUSES Bronchitis is most often caused by the same virus that causes a cold. SYMPTOMS   Body aches.   Chest congestion.   Chills.   Cough.   Fever.   Shortness of breath.   Sore throat.  TREATMENT  Acute bronchitis is usually treated with rest, fluids, and medicines for relief of fever or cough. Most symptoms should go away after a few days or a week. Increased fluids may help thin your secretions and will prevent dehydration. Your caregiver may give you an inhaler to improve your symptoms. The inhaler reduces shortness of breath and helps control cough. You can take over-the-counter pain relievers or cough medicine to decrease coughing, pain, or fever. A cool-air vaporizer may help thin bronchial secretions and make it easier to clear your chest. Antibiotics are usually not needed but can be prescribed if you smoke, are seriously ill, have chronic lung problems, are elderly, or you are at higher risk for developing complications.Allergies and asthma can make bronchitis worse. Repeated episodes of bronchitis may cause longstanding lung problems. Avoid smoking and secondhand smoke.Exposure to cigarette smoke or irritating chemicals will make bronchitis worse. If you are a cigarette smoker, consider using nicotine gum or skin patches to help control withdrawal symptoms. Quitting smoking will help your lungs heal faster. Recovery from bronchitis is often slow, but you should start feeling better after 2 to 3 days. Cough from bronchitis frequently lasts for 3 to 4 weeks. To prevent another bout of acute bronchitis:  Quit smoking.   Wash your hands frequently to get rid of viruses or use a hand sanitizer.   Avoid other people with cold or virus symptoms.   Try not to touch your hands to your mouth, nose, or eyes.  SEEK  IMMEDIATE MEDICAL CARE IF:  You develop increased fever, chills, or chest pain.   You have severe shortness of breath or bloody sputum.   You develop dehydration, fainting, repeated vomiting, or a severe headache.   You have no improvement after 1 week of treatment or you get worse.  MAKE SURE YOU:   Understand these instructions.   Will watch your condition.   Will get help right away if you are not doing well or get worse.  Document Released: 11/21/2004 Document Revised: 10/03/2011 Document Reviewed: 02/06/2011 [facility] Patient Information 2012 [employer].   Use the inhaler as directed,  Taking 2 puffs every 4 hours if needed for return of wheezing.  Take your next dose of the prednisone tomorrow evening.  You may take the hydrocodone prescribed for pain relief and for cough suppresion.  This will make you drowsy - do not drive within 4 hours of taking this medication.  Get rechecked if not improving over the next several days,  Or if you think your symtpoms are worsening.  Stop smoking.

## 2012-01-03 NOTE — ED Provider Notes (Signed)
History     CSN: [id number]  Arrival date & time 01/02/12  2057   First MD Initiated Contact with Patient 01/02/12 2113      Chief Complaint  Patient presents with  . Chest Pain  . Cough  . Shortness of Breath    (Consider location/radiation/quality/duration/timing/severity/associated sxs/prior treatment) Patient is a 51 y.o. male presenting with shortness of breath. The history is provided by the patient.  Shortness of Breath  The current episode started 3 to 5 days ago. The onset was gradual. The problem occurs continuously. The problem has been gradually worsening. The problem is moderate. Associated symptoms include cough, shortness of breath and wheezing. Pertinent negatives include no chest pain, no fever and no sore throat. Associated symptoms comments: He describes pain across his upper bilateral back,  Worse with deep inspiration and cough.. He has had no prior steroid use. He has had prior hospitalizations (Reports history of pneumothorax and reports his symptoms today are similar to this past event.). He has had no prior ICU admissions. He has had no prior intubations.    Past Medical History  Diagnosis Date  . Hypertension   . Stroke     several strokes last month per pt  . ADD (attention deficit disorder)   . Pneumothorax, right     Past Surgical History  Procedure Date  . Chest tube insertion   . Shoulder surgery   . Lung surgery     History reviewed. No pertinent family history.  History  Substance Use Topics  . Smoking status: Current Everyday Smoker -- 0.5 packs/day    Types: Cigarettes  . Smokeless tobacco: Not on file  . Alcohol Use: Yes     occ. use      Review of Systems  Constitutional: Negative for fever.  HENT: Negative for congestion, sore throat and neck pain.   Eyes: Negative.   Respiratory: Positive for cough, shortness of breath and wheezing. Negative for chest tightness.   Cardiovascular: Negative for chest pain.  Gastrointestinal:  Negative for nausea and abdominal pain.  Genitourinary: Negative.   Musculoskeletal: Positive for back pain. Negative for joint swelling and arthralgias.  Skin: Negative.  Negative for rash and wound.  Neurological: Negative for dizziness, weakness, light-headedness, numbness and headaches.  Hematological: Negative.   Psychiatric/Behavioral: Negative.     Allergies  Review of patient's allergies indicates no known allergies.  Home Medications   Current Outpatient Rx  Name Route Sig Dispense Refill  . AMPHETAMINE-DEXTROAMPHET ER 30 MG PO CP24 Oral Take 30 mg by mouth every morning.    Marland Kitchen METOPROLOL TARTRATE 25 MG PO TABS Oral Take 25 mg by mouth daily.    Marland Kitchen HYDROCODONE-ACETAMINOPHEN 5-325 MG PO TABS Oral Take 1 tablet by mouth every 4 (four) hours as needed for pain (take  for cough suppression). 15 tablet 0  . PREDNISONE 20 MG PO TABS Oral Take 3 tablets (60 mg total) by mouth daily. 12 tablet 0    BP 130/88  Pulse 81  Resp 17  Ht 5\' 8"  (1.727 m)  Wt 200 lb (90.719 kg)  BMI 30.41 kg/m2  SpO2 97%  Physical Exam  Nursing note and vitals reviewed. Constitutional: He is oriented to person, place, and time. He appears well-developed and well-nourished.  HENT:  Head: Normocephalic and atraumatic.  Eyes: Conjunctivae are normal.  Neck: Normal range of motion.  Cardiovascular: Normal rate, regular rhythm, normal heart sounds and intact distal pulses.   Pulmonary/Chest: Effort normal. No respiratory distress. He  has wheezes. He has no rhonchi. He has no rales.         Decreased breath sounds with moderate wheezing throughout all lung fields.  Abdominal: Soft. Bowel sounds are normal. There is no tenderness.  Musculoskeletal: Normal range of motion.  Neurological: He is alert and oriented to person, place, and time.  Skin: Skin is warm and dry.  Psychiatric: He has a normal mood and affect.    ED Course  Procedures (including critical care time)  Labs Reviewed - No data to  display Dg Chest Portable 1 View  01/02/2012  *RADIOLOGY REPORT*  Clinical Data: Chest pain with cough and congestion.  PORTABLE CHEST - 1 VIEW  Comparison: 03/25/2011 and 03/24/2011.  Findings: 2120 hours.  There is motion artifact, reportedly due to coughing. The heart size and mediastinal contours are stable.  The lungs are clear.  Upper right chest wall deformity appears stable. No acute osseous findings are evident.  IMPRESSION: No acute cardiopulmonary process identified.  Original Report Authenticated By: Vivia Ewing, M.D.   Tx.  Pateint given  Albuterol/atrovent neb x 1 , prednisnone 60 mg po with improved aeration and resolution of wheezing.  Give albuterol mdi with script,  Pulse dose of prednisone script.  Referrals to obtain pcp.  Advised to stop smoking  1. Bronchitis, acute, with bronchospasm       MDM  Patient discussed with Dr. Roderic Palau who also saw the patient prior to discharge.   Date: 01/02/2012  Rate: 81  Rhythm: normal sinus rhythm  QRS Axis: normal  Intervals: normal  ST/T Wave abnormalities: normal  Conduction Disutrbances:none  Narrative Interpretation:   Old EKG Reviewed: unchanged          Fulton Reek, PA 01/03/12 1450

## 2012-01-03 NOTE — ED Notes (Signed)
Discharge instructions reviewed with pt, questions answered. Pt verbalized understanding.  

## 2012-01-08 NOTE — ED Provider Notes (Signed)
Medical screening examination/treatment/procedure(s) were performed by non-physician practitioner and as supervising physician I was immediately available for consultation/collaboration.   Maudry Diego, MD 01/08/12 [phone]

## 2014-08-07 ENCOUNTER — Emergency Department (HOSPITAL_COMMUNITY): Payer: Medicare Other

## 2014-08-07 ENCOUNTER — Encounter (HOSPITAL_COMMUNITY): Payer: Self-pay | Admitting: Emergency Medicine

## 2014-08-07 ENCOUNTER — Emergency Department (HOSPITAL_COMMUNITY)
Admission: EM | Admit: 2014-08-07 | Discharge: 2014-08-07 | Disposition: A | Discharge status: Home / Self Care | Payer: Medicare Other | Attending: Emergency Medicine | Admitting: Emergency Medicine

## 2014-08-07 DIAGNOSIS — J441 Chronic obstructive pulmonary disease with (acute) exacerbation: Secondary | ICD-10-CM | POA: Insufficient documentation

## 2014-08-07 DIAGNOSIS — Z720 Tobacco use: Secondary | ICD-10-CM | POA: Diagnosis not present

## 2014-08-07 DIAGNOSIS — M1990 Unspecified osteoarthritis, unspecified site: Secondary | ICD-10-CM | POA: Insufficient documentation

## 2014-08-07 DIAGNOSIS — G8929 Other chronic pain: Secondary | ICD-10-CM | POA: Diagnosis not present

## 2014-08-07 DIAGNOSIS — Z8673 Personal history of transient ischemic attack (TIA), and cerebral infarction without residual deficits: Secondary | ICD-10-CM | POA: Insufficient documentation

## 2014-08-07 DIAGNOSIS — M546 Pain in thoracic spine: Secondary | ICD-10-CM | POA: Insufficient documentation

## 2014-08-07 DIAGNOSIS — I10 Essential (primary) hypertension: Secondary | ICD-10-CM | POA: Insufficient documentation

## 2014-08-07 DIAGNOSIS — Z8709 Personal history of other diseases of the respiratory system: Secondary | ICD-10-CM | POA: Diagnosis not present

## 2014-08-07 DIAGNOSIS — M7918 Myalgia, other site: Secondary | ICD-10-CM

## 2014-08-07 DIAGNOSIS — Z8659 Personal history of other mental and behavioral disorders: Secondary | ICD-10-CM | POA: Diagnosis not present

## 2014-08-07 HISTORY — DX: Pain in unspecified joint: M25.50

## 2014-08-07 HISTORY — DX: Unspecified osteoarthritis, unspecified site: M19.90

## 2014-08-07 HISTORY — DX: Other psychoactive substance abuse, uncomplicated: F19.10

## 2014-08-07 HISTORY — DX: Other chronic pain: G89.29

## 2014-08-07 LAB — RAPID URINE DRUG SCREEN, HOSP PERFORMED
Amphetamines: NOT DETECTED
Barbiturates: NOT DETECTED
Benzodiazepines: NOT DETECTED
Cocaine: NOT DETECTED
OPIATES: NOT DETECTED
Tetrahydrocannabinol: NOT DETECTED

## 2014-08-07 LAB — BASIC METABOLIC PANEL
ANION GAP: 14 (ref 5–15)
BUN: 10 mg/dL (ref 6–23)
CALCIUM: 9.3 mg/dL (ref 8.4–10.5)
CO2: 24 mEq/L (ref 19–32)
Chloride: 103 mEq/L (ref 96–112)
Creatinine, Ser: 1 mg/dL (ref 0.50–1.35)
GFR, EST NON AFRICAN AMERICAN: 83 mL/min — AB (ref >90)
GLUCOSE: 113 mg/dL — AB (ref 70–99)
POTASSIUM: 3.5 meq/L — AB (ref 3.7–5.3)
SODIUM: 141 meq/L (ref 137–147)

## 2014-08-07 LAB — CBC
HCT: 41.7 % (ref 39.0–52.0)
HEMOGLOBIN: 14.6 g/dL (ref 13.0–17.0)
MCH: 34.6 pg — ABNORMAL HIGH (ref 26.0–34.0)
MCHC: 35 g/dL (ref 30.0–36.0)
MCV: 98.8 fL (ref 78.0–100.0)
PLATELETS: 282 10*3/uL (ref 150–400)
RBC: 4.22 MIL/uL (ref 4.22–5.81)
RDW: 12.3 % (ref 11.5–15.5)
WBC: 7.6 10*3/uL (ref 4.0–10.5)

## 2014-08-07 LAB — D-DIMER, QUANTITATIVE: D-Dimer, Quant: <0.27 ug/mL-FEU (ref 0.00–0.48)

## 2014-08-07 LAB — TROPONIN I

## 2014-08-07 LAB — ETHANOL

## 2014-08-07 MED ORDER — OXYCODONE-ACETAMINOPHEN 5-325 MG PO TABS
2.0000 | ORAL_TABLET | Freq: Once | ORAL | Status: AC
  Administered 2014-08-07: 2 via ORAL
  Filled 2014-08-07: qty 2

## 2014-08-07 MED ORDER — ALBUTEROL SULFATE (2.5 MG/3ML) 0.083% IN NEBU
2.5000 mg | INHALATION_SOLUTION | Freq: Once | RESPIRATORY_TRACT | Status: AC
  Administered 2014-08-07: 2.5 mg via RESPIRATORY_TRACT
  Filled 2014-08-07: qty 3

## 2014-08-07 MED ORDER — IPRATROPIUM-ALBUTEROL 0.5-2.5 (3) MG/3ML IN SOLN
3.0000 mL | Freq: Once | RESPIRATORY_TRACT | Status: AC
  Administered 2014-08-07: 3 mL via RESPIRATORY_TRACT
  Filled 2014-08-07: qty 3

## 2014-08-07 MED ORDER — HYDROCODONE-ACETAMINOPHEN 5-325 MG PO TABS: ORAL_TABLET | ORAL | Status: DC

## 2014-08-07 MED ORDER — PREDNISONE 50 MG PO TABS
60.0000 mg | ORAL_TABLET | Freq: Once | ORAL | Status: AC
  Administered 2014-08-07: 60 mg via ORAL
  Filled 2014-08-07 (×2): qty 1

## 2014-08-07 MED ORDER — METHOCARBAMOL 500 MG PO TABS: 1000.0000 mg | ORAL_TABLET | Freq: Four times a day (QID) | ORAL | Status: DC | PRN

## 2014-08-07 MED ORDER — ALBUTEROL SULFATE HFA 108 (90 BASE) MCG/ACT IN AERS: 2.0000 | INHALATION_SPRAY | RESPIRATORY_TRACT | Status: DC | PRN

## 2014-08-07 NOTE — ED Provider Notes (Signed)
CSN: [id number]     Arrival date & time 08/07/14  1334 History   First MD Initiated Contact with Patient 08/07/14 1402     Chief Complaint  Patient presents with  . Back Pain      HPI Pt was seen at 1400. Per pt, c/o gradual onset and persistence of constant left sided thoracic back "pain" that began 2 -3 days ago. Pt describes the pain as "sharp," "shooting," and constant; worsens with palpation of the area and body position changes. Pt states he feels "SOB" since the back pain started. Pt states he hasn't been able to sleep due to the pain and "that makes me feels confused because I'm tired." Pt denies any other symptoms to me. Denies fevers, no CP/palpitations, no cough, no abd pain, no N/V/D, no rash, no fevers, no visual changes, no focal motor weakness, no tingling/numbness in extremities, no ataxia, no slurred speech, no facial droop.      Past Medical History  Diagnosis Date  . Hypertension   . ADD (attention deficit disorder)   . Pneumothorax, right   . Chronic joint pain   . Arthritis   . Polysubstance abuse   . Stroke 2012    LUE residual deficit   Past Surgical History  Procedure Laterality Date  . Chest tube insertion    . Shoulder surgery    . Lung surgery    . Video assisted thoracoscopy (vats) w/talc pleuadesis  2010    History  Substance Use Topics  . Smoking status: Current Every Day Smoker -- 0.50 packs/day    Types: Cigarettes  . Smokeless tobacco: Not on file  . Alcohol Use: Yes     Comment: occ. use    Review of Systems ROS: Statement: All systems negative except as marked or noted in the HPI; Constitutional: Negative for fever and chills. ; ; Eyes: Negative for eye pain, redness and discharge. ; ; ENMT: Negative for ear pain, hoarseness, nasal congestion, sinus pressure and sore throat. ; ; Cardiovascular: Negative for chest pain, palpitations, diaphoresis, and peripheral edema. ; ; Respiratory: Negative for cough, wheezing and stridor. ; ;  Gastrointestinal: Negative for nausea, vomiting, diarrhea, abdominal pain, blood in stool, hematemesis, jaundice and rectal bleeding. ; ; Genitourinary: Negative for dysuria, flank pain and hematuria. ; ; Musculoskeletal: +left sided back pain. Negative for neck pain. Negative for swelling and trauma.; ; Skin: Negative for pruritus, rash, abrasions, blisters, bruising and skin lesion.; ; Neuro: Negative for headache, lightheadedness and neck stiffness. Negative for weakness, altered level of consciousness , altered mental status, extremity weakness, paresthesias, involuntary movement, seizure and syncope.      Allergies  Review of patient's allergies indicates no known allergies.  Home Medications   Prior to Admission medications   Not on File   BP 163/103  Pulse 79  Temp(Src) 98.4 F (36.9 C) (Oral)  Resp 18  Ht 5\' 8"  (1.727 m)  Wt 180 lb (81.647 kg)  BMI 27.38 kg/m2  SpO2 96% Physical Exam 1405: Physical examination:  Nursing notes reviewed; Vital signs and O2 SAT reviewed;  Constitutional: Well developed, Well nourished, Well hydrated, In no acute distress; Head:  Normocephalic, atraumatic; Eyes: EOMI, PERRL, No scleral icterus; ENMT: Mouth and pharynx normal, Mucous membranes moist; Neck: Supple, Full range of motion, No lymphadenopathy; Cardiovascular: Regular rate and rhythm, No gallop; Respiratory: Breath sounds diminished & equal bilaterally, No wheezes.  Speaking full sentences with ease, Normal respiratory effort/excursion; Chest: Nontender, Movement normal; Abdomen: Soft, Nontender, Nondistended,  Normal bowel sounds; Genitourinary: No CVA tenderness; Spine:  No midline CS, TS, LS tenderness. +TTP left thoracic paraspinal muscles. No rash.;; Extremities: Pulses normal, No tenderness, No edema, No calf edema or asymmetry.; Neuro: AA&Ox3, Major CN grossly intact.  Speech clear. +chronic LUE weakness and contraction s/p previous CVA, otherwise no new gross focal motor or sensory  deficits in extremities. Climbs on and off stretcher easily by himself. Gait steady.; Skin: Color normal, Warm, Dry.   ED Course  Procedures     EKG Interpretation   Date/Time:  Sunday August 07 2014 13:41:54 EDT Ventricular Rate:  86 PR Interval:  152 QRS Duration: 90 QT Interval:  418 QTC Calculation: 500 R Axis:   21 Text Interpretation:  Normal sinus rhythm Nonspecific ST abnormality  Prolonged QT Abnormal ECG When compared with ECG of 01/02/2012 QT has  lengthened Otherwise no significant change Confirmed by [facility]  MD,  Nunzio Cory [phone]) on 08/07/2014 2:20:49 PM      MDM  MDM Reviewed: previous chart, nursing note and vitals Reviewed previous: labs and ECG Interpretation: labs, ECG, x-ray and CT scan    Results for orders placed during the hospital encounter of 08/07/14  CBC      Result Value Ref Range   WBC 7.6  4.0 - 10.5 K/uL   RBC 4.22  4.22 - 5.81 MIL/uL   Hemoglobin 14.6  13.0 - 17.0 g/dL   HCT 41.7  39.0 - 52.0 %   MCV 98.8  78.0 - 100.0 fL   MCH 34.6 (*) 26.0 - 34.0 pg   MCHC 35.0  30.0 - 36.0 g/dL   RDW 12.3  11.5 - 15.5 %   Platelets 282  150 - 400 K/uL  BASIC METABOLIC PANEL      Result Value Ref Range   Sodium 141  137 - 147 mEq/L   Potassium 3.5 (*) 3.7 - 5.3 mEq/L   Chloride 103  96 - 112 mEq/L   CO2 24  19 - 32 mEq/L   Glucose, Bld 113 (*) 70 - 99 mg/dL   BUN 10  6 - 23 mg/dL   Creatinine, Ser 1.00  0.50 - 1.35 mg/dL   Calcium 9.3  8.4 - 10.5 mg/dL   GFR calc non Af Amer 83 (*) >90 mL/min   GFR calc Af Amer >90  >90 mL/min   Anion gap 14  5 - 15  TROPONIN I      Result Value Ref Range   Troponin I <0.30  <0.30 ng/mL  ETHANOL      Result Value Ref Range   Alcohol, Ethyl (B) <11  0 - 11 mg/dL  URINE RAPID DRUG SCREEN (HOSP PERFORMED)      Result Value Ref Range   Opiates NONE DETECTED  NONE DETECTED   Cocaine NONE DETECTED  NONE DETECTED   Benzodiazepines NONE DETECTED  NONE DETECTED   Amphetamines NONE DETECTED  NONE DETECTED    Tetrahydrocannabinol NONE DETECTED  NONE DETECTED   Barbiturates NONE DETECTED  NONE DETECTED  D-DIMER, QUANTITATIVE      Result Value Ref Range   D-Dimer, Quant <0.27  0.00 - 0.48 ug/mL-FEU   Dg Chest 2 View 08/07/2014   CLINICAL DATA:  Cough, congestion, fever, dizziness, left side chest pain and back pain  EXAM: CHEST  2 VIEW  COMPARISON:  01/02/2012  FINDINGS: Cardiomediastinal silhouette is stable. No acute infiltrate or pleural effusion. No pulmonary edema. Bony thorax is unremarkable.  IMPRESSION: No active cardiopulmonary disease.  Electronically Signed   By: Lahoma Crocker M.D.   On: 08/07/2014 14:20   Ct Head Wo Contrast 08/07/2014   CLINICAL DATA:  Confusion. Altered mental status. Previous strokes. Blurred vision. Headache.  EXAM: CT HEAD WITHOUT CONTRAST  TECHNIQUE: Contiguous axial images were obtained from the base of the skull through the vertex without intravenous contrast.  COMPARISON:  03/24/2011  FINDINGS: There is no evidence of intracranial hemorrhage, brain edema, or other signs of acute infarction. There is no evidence of intracranial mass lesion or mass effect. No abnormal extraaxial fluid collections are identified.  Old bilateral cerebral infarcts are again seen involving the right frontal and parietal lobes as well as the left occipital lobe. No evidence of hydrocephalus. No skull abnormality identified.  IMPRESSION: No acute intracranial abnormality.  Old bilateral cerebral infarcts.   Electronically Signed   By: Earle Gell M.D.   On: 08/07/2014 14:25    1635:  Pt states he feels better after neb treatment and pain medicine and wants to go home. Sats 96% R/A with lungs CTA bilat, resps easy, NAD. Denies any further complaints. Doubt PE as cause for symptoms with normal d-dimer and low risk [place].  Doubt ACS as cause for symptoms with normal troponin and unchanged EKG from previous after 2-3 days of constant atypical symptoms. Pt does have hx of ongoing tobacco use, will tx for  possible COPD exacerbation. Dx and testing d/w pt.  Questions answered.  Verb understanding, agreeable to d/c home with outpt f/u.      Francine Graven, DO 08/10/14 [phone]

## 2014-08-07 NOTE — ED Notes (Signed)
Pt states unable to get daughter for ride and will wait out in waiting room til able to drive ( 2 hours)

## 2014-08-07 NOTE — ED Notes (Signed)
Pt states that he will daughter for ride home once discharged

## 2014-08-07 NOTE — Discharge Instructions (Signed)
°Emergency Department Resource Guide °1) Find a Doctor and Pay Out of Pocket °Although you won't have to find out who is covered by your insurance plan, it is a good idea to ask around and get recommendations. You will then need to call the office and see if the doctor you have chosen will accept you as a new patient and what types of options they offer for patients who are self-pay. Some doctors offer discounts or will set up payment plans for their patients who do not have insurance, but you will need to ask so you aren't surprised when you get to your appointment. ° °2) Contact Your Local Health Department °Not all health departments have doctors that can see patients for sick visits, but many do, so it is worth a call to see if yours does. If you don't know where your local health department is, you can check in your phone book. The CDC also has a tool to help you locate your state's health department, and many state websites also have listings of all of their local health departments. ° °3) Find a Walk-in Clinic °If your illness is not likely to be very severe or complicated, you may want to try a walk in clinic. These are popping up all over the country in pharmacies, drugstores, and shopping centers. They're usually staffed by nurse practitioners or physician assistants that have been trained to treat common illnesses and complaints. They're usually fairly quick and inexpensive. However, if you have serious medical issues or chronic medical problems, these are probably not your best option. ° °No Primary Care Doctor: °- Call [facility] Connect at  [phone] - they can help you locate a primary care doctor that  accepts your insurance, provides certain services, etc. °- Physician Referral Service- 1-800-533-3463 ° °Chronic Pain Problems: °Organization         Address  Phone   Notes  °[facility]  ([phone]) [phone] Patients need to be referred by their primary care doctor.  ° °Medication  Assistance: °Organization         Address  Phone   Notes  °[place] [facility] [address] °[place], [place] [address] (336) 641-8030 --Must be a resident of [place] °-- Must have NO insurance coverage whatsoever (no Medicaid/ Medicare, etc.) °-- The pt. MUST have a primary care doctor that directs their care regularly and follows them in the community °  °[facility]  (866) 331-1348   °[employer]  (888) 892-1162   ° °Agencies that provide inexpensive medical care: °Organization         Address  Phone   Notes  °[facility]  (336) 832-8035   °[facility]    (336) 832-7272   °[facility] [address] °[place], [place] [address] (336) 832-4777   °[facility] [address], °[place] (336) 271-4999   °[facility]    (336) 373-0678   °[facility]    (336) 272-1050   °[facility] ° [address],  Phone:  (336) 832-4444, Fax:  (336) 832-4440 Hours of Operation:  9 am - 6 pm, M-F.  Also accepts Medicaid/Medicare and self-pay.  °[facility] ° [address],  Phone: (336) 832-3150, Fax: (336) 832-3151. Hours of Operation:  8:30 am - 5:30 pm, M-F.  Also accepts Medicaid and self-pay.  °[facility] [place] [address], [place] Phone: (336) 878-6027   °[facility] [address], [place], [place] (336)723-1848, Ext. 123 Mondays & Thursdays: 7-9 AM.  First 15 patients are seen on a first come, first serve basis. °  ° °Medicaid-accepting [place] Providers: ° °Organization         Address  Phone   Notes  °[facility] [address], [place] (336) 641-2100 Also accepts self-pay patients.  °[facility] [address], [place] ° (336) 856-9996   °[facility] [address], [place]  (336) 288-8857   °[facility] [address] [place] [address], [place] (336) 299-7000   °Veita Bland [address], [place]  ° (336) 373-1557 Only accepts [place] Access Medicaid patients after they have their name applied to their card.  ° °Self-Pay (no insurance) in [place]: ° °Organization         Address  Phone   Notes  °Sickle Cell Patients, [facility] [address], [place] (336) 832-1970   °[facility] [address], [place] (336) 832-4400   °[facility] ° [address], [place] (336) 992-4800   °[facility]/Dr. Osei-Bonsu ° [address] [place] [address], [place] or [address], [place] (336) [phone] Phone number for both [place] and [place] locations is the same.  °[facility] [address], [place] (336) 299-0000   °[facility] [address] [place] [address], [place] or [address] (336) 852-7530 °(336) 878-2260   °[facility] [address], [place] (336) 350-1642, phone; (336) 294-5005, fax Sees patients 1st and 3rd Saturday of every month.  Must not qualify for public or private insurance (i.e. Medicaid, Medicare, [place] Health Choice, Veterans' Benefits) • Household income should be no more than 200% of the poverty level •The clinic cannot treat you if you are pregnant or think you are pregnant • Sexually transmitted diseases are not treated at the clinic.  ° ° °Dental Care: °Organization         Address  Phone  Notes  °[place] [facility] [address], [place] (336) 641-6152 Accepts children up to age 21 who are enrolled in Medicaid or [place] Health Choice; pregnant women with a Medicaid card; and children who have applied for Medicaid or [place] Health Choice, but were declined, whose parents can pay a reduced fee at time of service.  °[place]  [facility] [place]  [address], [place] (336) 641-7733 Accepts children up to age 21 who are enrolled in Medicaid or [place] Health Choice; pregnant women with a Medicaid card; and children who have applied for Medicaid or [place] Health Choice, but were declined, whose parents can pay a reduced fee at time of service.  °[facility] ° [address], [place] (336) 641-4533 Patients are seen by appointment only. Walk-ins are not accepted. [facility] will see patients 18 years of age and older. °Monday - Tuesday (8am-5pm) °Most Wednesdays (8:30-5pm) °$30 per visit, cash only  °[facility] ° [address], [place] (336) 641-4533 Patients are seen by appointment only. Walk-ins are not accepted. [facility] will see patients 18 years of age and older. °One   Wednesday Evening (Monthly: Volunteer Based).  $30 per visit, cash only  °[facility]  (919) 537-3737 for adults; Children under age 4, call Graduate Pediatric Dentistry at (919) [phone]. Children aged 4-14, please call (919) [phone] to request a pediatric application. ° Dental services are provided in all areas of dental care including fillings, crowns and bridges, complete and partial dentures, implants, gum treatment, root canals, and extractions. Preventive care is also provided. Treatment is provided to both adults and children. °Patients are selected via a lottery and there is often a waiting list. °  °[facility] [address], °[place] ° (336) 763-8833 [email or web address].[email or web address] °  °[facility] [address], [place], [name] (336)723-1848, Ext. 123 Second and Fourth Thursday of each month, opens at 6:30 AM; Clinic ends at 9 AM.  Patients are seen on a first-come first-served basis, and a limited number are seen during each clinic.  ° °[facility] ° [address], [place], [place] (336) 723-7904    Eligibility Requirements °You must have lived in [place], [place], or [place] counties for at least the last three months. °  You cannot be eligible for state or federal sponsored healthcare insurance, including Veterans Administration, Medicaid, or Medicare. °  You generally cannot be eligible for healthcare insurance through your employer.  °  How to apply: °Eligibility screenings are held every Tuesday and Wednesday afternoon from 1:00 pm until 4:00 pm. You do not need an appointment for the interview!  °[facility] [address], [place], [place] 336-631-2330   °[facility]  336-342-8273   °[facility]  336-703-3100   °[facility]  336-570-6415   ° °Behavioral Health Resources in the Community: °Intensive Outpatient Programs °Organization         Address  Phone  Notes  °[facility] [address], [place], [place] 336-878-6098   °[facility] [address], [place], [place] 336-832-9800   °[facility] [address], [place], [place] ° 336-882-2125   °[facility] [address],  °[place], [place] 1-800-853-5163 or 336-641-4981   °Substance Abuse Resources °Organization         Address  Phone  Notes  °[facility]  336-882-2125   °[facility]  336-784-9470   °[facility]  336-285-9073   °[facility]  336-845-3988   °Residential & Outpatient Substance Abuse Program  1-800-659-3381   °Psychological Services °Organization         Address  Phone  Notes  °[facility]  336- 832-9600   °[facility]  336- 378-7881   °[facility] [address], [place] 1-800-853-5163 or 336-641-4981   ° °Mobile Crisis Teams °Organization         Address  Phone  Notes  °[facility], Mobile Crisis Care Unit  1-877-626-1772   °Assertive °[facility] ° [address]  [place], [place] 336-834-9664   °Sharon DeEsch [address] °[place] 336-554-5454   ° °Self-Help/Support Groups °Organization         Address  Phone             Notes  °[facility]  - variety of support groups  336- 373-1402 Call for more information  °Narcotics Anonymous (NA), [facility] [address], °[place]  2 meetings at this location  ° °  Residential Treatment Programs Organization         Address  Phone  Notes  [facility] [address],    [place]  1-408-229-4757   [facility]  [address], [place] [id number], [place], [place]   [place], [place] 727-389-3215 Admissions: 8am-3pm M-F  [facility] [address]    [place], [place] X4321937   [facility] [address] [place], [place], [place]   [facility] [address],  [place], [place]   [facility] Intensive Outpatient [address], [place], [place]   [facility] [place].,  [place], [place] 1-4096335511 or (734) 029-2919   [facility] ([facility] [address] [place], [place] Accepts Medicaid  [facility] [address],  [place] 1-(912)039-1617 Substance Abuse/Addiction Treatment   [facility]         Address  Phone  Notes  [facility]  931 500 2815   Domenic Schwab, PhD [address] [place], [place]   431-245-2135 or 3091782052   [facility] [place], [place] 305-115-7225   [facility] [address], [place], [place] 605-198-6406 Insurance/Medicaid/sponsorship through [facility] and Families [name] [place]                                    [place], [place] [phone] [place] [address], [place] [phone]    Dr. Adele Schilder  [phone]   [facility]. 1) [address] [place] 2) [place] 3)  [address], [name] [phone]  [phone]   [place] 2234393869 or ([phone] (After Hours)       Take the prescriptions as directed.  Use your albuterol inhalerevery 4 hours for the next 7 days, then as needed for cough, wheezing, or shortness of breath. Try to stop smoking. Apply moist heat or ice to the area(s) of discomfort, for 15 minutes at a time, several times per day for the next few days.  Do not fall asleep on a heating or ice pack.  Call your regular medical doctor tomorrow to schedule a follow up appointment this week.  Return to the Emergency Department immediately if worsening.

## 2014-08-07 NOTE — ED Notes (Addendum)
Multiple complaints.Pt reports back pain,generalized weakness,blurred vision, difficulty breathing, headaches and "disorientation" since Friday. Pt denies any new or recent injury. nad noted. Pt alert and oriented. Airway patent. Speech clear. Face symmetrical.equal grips. No dyspnea noted in triage.

## 2014-08-07 NOTE — ED Notes (Signed)
MD at bedside. 

## 2014-08-07 NOTE — ED Notes (Signed)
Pt aware of need for urine sample.  

## 2014-08-09 NOTE — ED Notes (Signed)
Patient called up to ER stating continued pain and medications are not helping. Reviewed chart. Patient requesting refill. Informed patient the ED did not refill medication and that he would have to be re-evaluated in order to receive additional medications at the MD's discretion. Patient gave verbal understanding.

## 2016-11-21 ENCOUNTER — Ambulatory Visit (INDEPENDENT_AMBULATORY_CARE_PROVIDER_SITE_OTHER): Payer: Medicare Other | Admitting: Family Medicine

## 2016-11-21 ENCOUNTER — Encounter: Payer: Self-pay | Admitting: Family Medicine

## 2016-11-21 VITALS — BP 200/99 | HR 84 | Temp 98.2°F | Resp 16 | Ht 66.5 in | Wt 206.8 lb

## 2016-11-21 DIAGNOSIS — R519 Headache, unspecified: Secondary | ICD-10-CM

## 2016-11-21 DIAGNOSIS — F1020 Alcohol dependence, uncomplicated: Secondary | ICD-10-CM

## 2016-11-21 DIAGNOSIS — Z8673 Personal history of transient ischemic attack (TIA), and cerebral infarction without residual deficits: Secondary | ICD-10-CM | POA: Diagnosis not present

## 2016-11-21 DIAGNOSIS — F5101 Primary insomnia: Secondary | ICD-10-CM | POA: Diagnosis not present

## 2016-11-21 DIAGNOSIS — Z23 Encounter for immunization: Secondary | ICD-10-CM

## 2016-11-21 DIAGNOSIS — F17200 Nicotine dependence, unspecified, uncomplicated: Secondary | ICD-10-CM

## 2016-11-21 DIAGNOSIS — I10 Essential (primary) hypertension: Secondary | ICD-10-CM | POA: Diagnosis not present

## 2016-11-21 DIAGNOSIS — R51 Headache: Secondary | ICD-10-CM

## 2016-11-21 LAB — CBC WITH DIFFERENTIAL/PLATELET
Basophils Absolute: 0.1 10*3/uL (ref 0.0–0.1)
Basophils Relative: 0.9 % (ref 0.0–3.0)
EOS ABS: 0.2 10*3/uL (ref 0.0–0.7)
Eosinophils Relative: 2.4 % (ref 0.0–5.0)
HCT: 46.1 % (ref 39.0–52.0)
Hemoglobin: 15.9 g/dL (ref 13.0–17.0)
LYMPHS ABS: 1.5 10*3/uL (ref 0.7–4.0)
Lymphocytes Relative: 21.2 % (ref 12.0–46.0)
MCHC: 34.4 g/dL (ref 30.0–36.0)
MCV: 97.5 fl (ref 78.0–100.0)
Monocytes Absolute: 0.4 10*3/uL (ref 0.1–1.0)
Monocytes Relative: 5.8 % (ref 3.0–12.0)
NEUTROS ABS: 4.9 10*3/uL (ref 1.4–7.7)
Neutrophils Relative %: 69.7 % (ref 43.0–77.0)
PLATELETS: 403 10*3/uL — AB (ref 150.0–400.0)
RBC: 4.73 Mil/uL (ref 4.22–5.81)
RDW: 13.5 % (ref 11.5–15.5)
WBC: 7 10*3/uL (ref 4.0–10.5)

## 2016-11-21 LAB — COMPREHENSIVE METABOLIC PANEL
ALT: 14 U/L (ref 0–53)
AST: 19 U/L (ref 0–37)
Albumin: 4.4 g/dL (ref 3.5–5.2)
Alkaline Phosphatase: 113 U/L (ref 39–117)
BUN: 13 mg/dL (ref 6–23)
CHLORIDE: 103 meq/L (ref 96–112)
CO2: 26 mEq/L (ref 19–32)
CREATININE: 0.92 mg/dL (ref 0.40–1.50)
Calcium: 9.3 mg/dL (ref 8.4–10.5)
GFR: 89.92 mL/min (ref >60.00)
Glucose, Bld: 88 mg/dL (ref 70–99)
Potassium: 4.2 mEq/L (ref 3.5–5.1)
SODIUM: 140 meq/L (ref 135–145)
Total Bilirubin: 0.4 mg/dL (ref 0.2–1.2)
Total Protein: 7.6 g/dL (ref 6.0–8.3)

## 2016-11-21 LAB — TSH: TSH: 1.58 u[IU]/mL (ref 0.35–4.50)

## 2016-11-21 MED ORDER — TRAZODONE HCL 50 MG PO TABS: ORAL_TABLET | ORAL | 1 refills | Status: DC

## 2016-11-21 MED ORDER — AMLODIPINE BESYLATE 10 MG PO TABS: 10.0000 mg | ORAL_TABLET | Freq: Every day | ORAL | 0 refills | Status: DC

## 2016-11-21 NOTE — Progress Notes (Signed)
Office Note 11/24/2016  CC:  Chief Complaint  Patient presents with  . Establish Care  . Nocturia  . Anxiety  . Code Stroke    has history of stroke, states that he has had symtoms for the past few weeks, HA, double vision and passing out   HPI:  [name] is a 56 y.o. male who is here to establish care. Patient's most recent primary MD: ?  [facility] in [place].  No visits to PMD since 2012 despite a multitude of chronic medical problems. Old records in EPIC/[facility] EMR were reviewed prior to or during today's visit.  Pt lists many complaints today, including: prostate complaints, anxiety, HA, double vision, and passing out (a few times the last couple weeks), elevated bp.   We agreed that focusing on getting his bp controlled was our first and only priority today and possibly the next 1-2 visits.  He cannot recall names of any past meds he has been on for anything.  He denies any known past hx of liver or kidney impairment.  He has hx of alcoholism and continues to drink 1-2 pints of whiskey per week and is not wanting to quit right now.  Long history of smoking cigarettes and is not contemplating cessation at this time.  He has been having frequent HAs in both temples and across forehead lately, with occ blurry vision and on one occasion he says he passed out when he leaned over to do something.  No injury with the syncopal episode.  Says he felt woozy/dizzy in the seconds prior to passing out.  No loss of bladder/bowel control.    Complains of chronic problems getting to sleep and maintaining sleep.  This is related to anxiety, and he also has nocturia x 5-7 times per night chronically.  He cannot recall the name of any sleep aid he has been rx'd in the past.   Past Medical History:  Diagnosis Date  . ADD (attention deficit disorder)   . Anxiety and depression   . Arthritis   . Chronic joint pain   . COPD (chronic obstructive pulmonary disease) ([employer])   . Depression   .  Frequent headaches   . History of kidney stones   . Hypertension   . Pneumothorax, right   . Polysubstance abuse   . Stroke ([place]) 2012   LUE residual deficit.  Old bilateral cerebral infarcts are again seen involving the right frontal and parietal lobes as well as the left occipital lobe    Past Surgical History:  Procedure Laterality Date  . CHEST TUBE INSERTION    . LUNG SURGERY    . SHOULDER SURGERY     Multiple rotator cuff surgeries on both sides.  Marland Kitchen VIDEO ASSISTED THORACOSCOPY (VATS) W/TALC PLEUADESIS  2010    Family History  Problem Relation Age of Onset  . Arthritis Mother   . Heart disease Father   . Hypertension Father   . Breast cancer Maternal Aunt   . Alcohol abuse Maternal Uncle   . Lung cancer Maternal Aunt     Social History   Social History  . Marital status: Married    Spouse name: N/A  . Number of children: N/A  . Years of education: N/A   Occupational History  . Not on file.   Social History Main Topics  . Smoking status: Current Every Day Smoker    Packs/day: 1.00    Years: 43.00    Types: Cigarettes  . Smokeless tobacco: Never  Used  . Alcohol use Yes     Comment: [place] Bean 1pt a week  . Drug use: Yes    Types: Marijuana  . Sexual activity: Not on file   Other Topics Concern  . Not on file   Social History Narrative   Divorced, 1 son and 1 daughter in [place].   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   Smokes: 80 pack-yr hx, current as of 10/2016.   Alc: 1-2 pint of Jim Beam per week.   Was in AA > 20 yrs ago.   Marijuana regularly.   "Lots of drugs" in the past.       MEDS: none currently  No Known Allergies  ROS Review of Systems  Constitutional: Positive for fatigue. Negative for fever.  HENT: Negative for congestion and sore throat.   Eyes: Positive for visual disturbance (blurry intermittently).  Respiratory: Negative for cough and shortness of breath.   Cardiovascular: Negative for chest pain, palpitations  and leg swelling.  Gastrointestinal: Negative for abdominal pain, blood in stool and nausea.  Genitourinary: Negative for dysuria and hematuria.       Nocturia  Musculoskeletal: Negative for back pain and joint swelling.  Skin: Negative for rash.  Neurological: Positive for headaches. Negative for weakness (no new weakness).  Hematological: Negative for adenopathy.  Psychiatric/Behavioral: Positive for dysphoric mood. The patient is nervous/anxious.     PE; Blood pressure (!) 200/99, pulse 84, temperature 98.2 F (36.8 C), temperature source Oral, resp. rate 16, height 5' 6.5" (1.689 m), weight 206 lb 12 oz (93.8 kg), SpO2 98 %. Gen: Alert, well appearing.  Patient is oriented to person, place, time, and situation. NLZ:JQBH: no injection, icteris, swelling, or exudate.  EOMI, PERRLA. Mouth: lips without lesion/swelling.  Oral mucosa pink and moist. Oropharynx without erythema, exudate, or swelling.  Neck - No masses or thyromegaly or limitation in range of motion CV: RRR, no m/r/g.   LUNGS: CTA bilat, nonlabored resps, good aeration in all lung fields. ABD: soft, NT, ND, BS normal.  No hepatospenomegaly or mass.  No bruits. EXT: no clubbing, cyanosis, or edema.  Neuro: CN 2-12 intact bilaterally, strength 5/5 in proximal and distal upper extremities and lower extremities bilaterally except for L hand strength which is 3/5 and he has flexion contracture of L hand/wrist.  No sensory deficits.  No ataxia.    No pronator drift.  Pertinent labs:  none  ASSESSMENT AND PLAN:   New pt; no prior PCP records available.  1) Uncontrolled HTN: patient noncompliance. Likely has been elevated like this for years, so will work on getting it down slowly rather than urgently. Start amlodipine 10mg  qd today.  Check BMET, CBC, and TSH today. I asked him to check his bp at a fire dept or pharmacy at least 2 times over the next 4-5 days before his next office f/u with me.   Alcohol and smoking cessation  would help bp control but he is not contemplating cessation of either one of these at this time.  2) Insomnia: largely related to chronic anxiety.  Will stay away from controlled substances with this patient with history of polysubstance abuse and alcoholism.  Trial of trazodone 50mg , 1-2 tabs qhs prn.    3) Alcoholism: pt not contemplating quitting at this time.    4) Tobacco dependence: pt not contemplating cessation at this time.  5) History of CVA: residual L wrist and hand weakness with flexion contracture present. Will eventually get patient started  on aspirin when his bp is under control. Will check lipids when fasting in near future.  Encouraged cessation of smoking cigs and drinking alcohol.  An After Visit Summary was printed and given to the patient.  Spent 45 min with pt today, with >50% of this time spent in counseling and care coordination regarding the above problems.  Return for 4-5 day f/u. will try to address nocturia and/or anxiety at next visit  Signed:  Crissie Sickles, MD           11/24/2016

## 2016-11-21 NOTE — Progress Notes (Signed)
Pre visit review using our clinic review tool, if applicable. No additional management support is needed unless otherwise documented below in the visit note. 

## 2016-11-24 ENCOUNTER — Encounter: Payer: Self-pay | Admitting: Family Medicine

## 2016-11-25 ENCOUNTER — Telehealth: Payer: Self-pay | Admitting: *Deleted

## 2016-11-25 NOTE — Telephone Encounter (Signed)
Pt has f/u apt with Dr. Anitra Lauth on 11/28/16 at 3:30pm.

## 2016-11-25 NOTE — Telephone Encounter (Signed)
Tried calling pt, phone was off.

## 2016-11-25 NOTE — Telephone Encounter (Signed)
PLEASE NOTE: All timestamps contained within this report are represented as [place] Standard Time. CONFIDENTIALTY NOTICE: This fax transmission is intended only for the addressee. It contains information that is legally privileged, confidential or otherwise protected from use or disclosure. If you are not the intended recipient, you are strictly prohibited from reviewing, disclosing, copying using or disseminating any of this information or taking any action in reliance on or regarding this information. If you have received this fax in error, please notify us immediately by telephone so that we can arrange for its return to [place]. Phone: [phone], Toll-Free: [phone], Fax: [phone] Page: 1 of 2 Call Id: [id number] [place] Patient Name: [name] Gender: Male DOB: 01/04/1961 Age: 56 Y 7 M 27 D Return Phone Number: [phone] (Primary) City/State/Zip: [place] Client [place] [facility] - Client Client Site [facility] Physician Crissie Sickles - MD Who Is Calling Patient / Member / Family / Caregiver Call Type Triage / Clinical Relationship To Patient Self Return Phone Number [phone] (Primary) Chief Complaint Medication reaction Reason for Call Symptomatic / Request for [place] is calling back regarding the Rx his Dr put him on, Called earlier, his phone kept dropping the call when the nurse called back Nurse Assessment Nurse: Junius Creamer, RN, Hilda Blades Date/Time ([place] Time): 11/23/2016 8:21:04 PM Confirm and document reason for call. If symptomatic, describe symptoms. ---caller states has questions regarding a med for bp and sleeping med. bp med is amlodipine besylate, sleeping med is trazadone. bp still running high, slightly down tho. last 134/77. c/o h/a but not as bad as was. 200/99 at md office. Does the PT have  any chronic conditions? (i.e. diabetes, asthma, etc.) ---Yes List chronic conditions. ---cva 55yrs ago also has had 2 mini strokes htn work injuries Guidelines Guideline Title Affirmed Question High Blood Pressure [1] BP # 130/80 AND [2] history of heart problems, kidney disease or diabetes Headache Headache is a chronic symptom (recurrent or ongoing AND present > 4 weeks) Disp. Time Eilene Ghazi Time) Disposition Final User 11/23/2016 8:37:38 PM See PCP within 2 Weeks Yes Junius Creamer, RN, Debra Referrals REFERRED TO PCP OFFICE Care Advice Given Per Guideline SEE PCP WITHIN 2 WEEKS: You need an evaluation for this ongoing problem within the next 2 weeks. Call your doctor during regular office hours and make an appointment. REASSURANCE: * Your blood pressure is elevated but you have told me that you are not having any symptoms. * You should see your doctor and have your blood pressure checked within 2 weeks. * ON MEDS: You might need to have an adjustment in your medication(s). HIGH BLOOD PRESSURE: * Untreated high blood pressure may cause damage to your heart, brain, kidneys, and eyes. * Treatment of high blood pressure can reduce the risk of stroke, heart attack, and heart failure. CALL BACK IF: * Weakness or numbness of the face, arm or leg on one side of the body occurs * Difficulty walking, difficulty talking, or severe headache occurs * Chest pain or difficulty breathing occurs * Your blood pressure is over 160/100 * You become worse. CARE ADVICE given per High Blood Pressure (Adult) guideline. SEE PCP WITHIN 2 WEEKS: You need an evaluation for this ongoing problem within the next 2 weeks. Call your doctor during regular office hours and make an appointment. PAIN MEDICINES: ACETAMINOPHEN (E.G., TYLENOL): REST: Lie down PLEASE NOTE: All timestamps contained  within this report are represented as [place] Standard Time. CONFIDENTIALTY NOTICE: This fax transmission is intended only for the addressee. It  contains information that is legally privileged, confidential or otherwise protected from use or disclosure. If you are not the intended recipient, you are strictly prohibited from reviewing, disclosing, copying using or disseminating any of this information or taking any action in reliance on or regarding this information. If you have received this fax in error, please notify us immediately by telephone so that we can arrange for its return to [place]. Phone: [phone], Toll-Free: [phone], Fax: [phone] Page: 2 of 2 Call Id: [id number] Care Advice Given Per Guideline in a dark quiet place and relax until feeling better. LOCAL COLD: Apply a cold wet washcloth or cold pack to the forehead for 20 minutes HEADACHE DIARY: Keep a diary of the headaches: date, time, place, what you were doing at the time, intensity, duration, what helps, etc. (Reason: try to find some of the triggers.) CALL BACK IF: * Severe headache persists over 2 hours after pain medicine * Headache lasts over 24 hours despite using a pain medicine * You become worse. CARE ADVICE given per Headache (Adult) guideline.

## 2016-11-25 NOTE — Telephone Encounter (Signed)
He needs to make a f/u appt with me this week (30 min, preferably)--thx

## 2016-11-25 NOTE — Telephone Encounter (Signed)
Please advise. Thanks.  

## 2016-11-28 ENCOUNTER — Ambulatory Visit (INDEPENDENT_AMBULATORY_CARE_PROVIDER_SITE_OTHER): Payer: Medicare Other | Admitting: Family Medicine

## 2016-11-28 ENCOUNTER — Encounter: Payer: Self-pay | Admitting: Family Medicine

## 2016-11-28 VITALS — BP 171/84 | HR 83 | Temp 99.0°F | Resp 16 | Wt 202.0 lb

## 2016-11-28 DIAGNOSIS — I10 Essential (primary) hypertension: Secondary | ICD-10-CM

## 2016-11-28 DIAGNOSIS — F411 Generalized anxiety disorder: Secondary | ICD-10-CM | POA: Diagnosis not present

## 2016-11-28 DIAGNOSIS — F5104 Psychophysiologic insomnia: Secondary | ICD-10-CM | POA: Diagnosis not present

## 2016-11-28 MED ORDER — FLUOXETINE HCL 20 MG PO TABS: 20.0000 mg | ORAL_TABLET | Freq: Every day | ORAL | 0 refills | Status: DC

## 2016-11-28 MED ORDER — IRBESARTAN 150 MG PO TABS: 150.0000 mg | ORAL_TABLET | Freq: Every day | ORAL | 6 refills | Status: DC

## 2016-11-28 MED ORDER — ZOLPIDEM TARTRATE ER 12.5 MG PO TBCR: 12.5000 mg | EXTENDED_RELEASE_TABLET | Freq: Every evening | ORAL | 0 refills | Status: DC | PRN

## 2016-11-28 MED ORDER — AMLODIPINE BESYLATE 10 MG PO TABS: 10.0000 mg | ORAL_TABLET | Freq: Every day | ORAL | 6 refills | Status: DC

## 2016-11-28 NOTE — Telephone Encounter (Signed)
Patient in today to discuss with Dr. Anitra Lauth.

## 2016-11-28 NOTE — Progress Notes (Signed)
OFFICE VISIT  11/29/2016   CC:  Chief Complaint  Patient presents with  . Follow-up    Discuss medications   HPI:    Patient is a 56 y.o. Caucasian male who presents for f/u HTN. Labs done at last week's visit were all normal.  Feeling better, says he doesn't feel like his heart is pounding out of his chest anymore. One bp check since last visit was done: 170s/80s.  Says trazodone made him vomit so he stopped it.  He took one of his son's 400 mg seroquel and it helped him get to get to sleep and stay asleep for 5 hours.  He says he has tried many prescription sleep aids (benzos, Lorrin Mais, has used all the otc sleep aids.  He says Lorrin Mais has helped in the past, just the CR and not the plain).  Has chronic generalized anxiety, no hx of panic attacks.  Says prozac has helped in the past and he wants to retry this.  Sites chronic worry about everything, feeling keyed up, restless, irritable, poor energy level.  Some intermittent depressed mood but nothing persistent.  Past Medical History:  Diagnosis Date  . ADD (attention deficit disorder)    "yeah I had this once" ??  . Anxiety and depression   . Arthritis   . Chronic joint pain   . COPD (chronic obstructive pulmonary disease) ([place])   . Depression   . Frequent headaches   . History of kidney stones   . Hypertension   . Pneumothorax, right   . Polysubstance abuse   . Stroke ([place]) 2012   LUE residual deficit: flexion contracture and weakness of L hand.  Old bilateral cerebral infarcts involving the right frontal and parietal lobes as well as the left occipital lobe  . Venous insufficiency of both lower extremities     Past Surgical History:  Procedure Laterality Date  . CHEST TUBE INSERTION    . LUNG SURGERY    . SHOULDER SURGERY     Multiple rotator cuff surgeries on both sides.  Marland Kitchen VIDEO ASSISTED THORACOSCOPY (VATS) W/TALC PLEUADESIS  2010    Outpatient Medications Prior to Visit  Medication Sig Dispense Refill  .  amLODipine (NORVASC) 10 MG tablet Take 1 tablet (10 mg total) by mouth daily. 30 tablet 0  . Doxylamine Succinate, Sleep, (SLEEP AID PO) Take by mouth.    . traZODone (DESYREL) 50 MG tablet 1-2 tabs po qhs prn insomnia (Patient not taking: Reported on 11/28/2016) 30 tablet 1   No facility-administered medications prior to visit.     No Known Allergies  ROS As per HPI  PE: Blood pressure (!) 171/84, pulse 83, temperature 99 F (37.2 C), resp. rate 16, weight 202 lb (91.6 kg), SpO2 98 %. Gen: Alert, well appearing.  Patient is oriented to person, place, time, and situation. CV: RRR, no m/r/g.   LUNGS: CTA bilat, nonlabored resps, good aeration in all lung fields. EXT: no clubbing, cyanosis, or edema.    LABS:    Chemistry      Component Value Date/Time   NA 140 11/21/2016 1442   K 4.2 11/21/2016 1442   CL 103 11/21/2016 1442   CO2 26 11/21/2016 1442   BUN 13 11/21/2016 1442   CREATININE 0.92 11/21/2016 1442      Component Value Date/Time   CALCIUM 9.3 11/21/2016 1442   ALKPHOS 113 11/21/2016 1442   AST 19 11/21/2016 1442   ALT 14 11/21/2016 1442   BILITOT 0.4 11/21/2016 1442  Lab Results  Component Value Date   WBC 7.0 11/21/2016   HGB 15.9 11/21/2016   HCT 46.1 11/21/2016   MCV 97.5 11/21/2016   PLT 403.0 (H) 11/21/2016   Lab Results  Component Value Date   TSH 1.58 11/21/2016   IMPRESSION AND PLAN:  1) Hypertension, uncontrolled. Add irbesartan 150mg  qd and continue amlodipine 10mg  qd. He'll try to monitor bp outside of office again.  2) Insomnia, chronic/intractible.  Says Lorrin Mais CR is the only thing in the past that has worked for him sufficiently. Will rx ambien CR 12.5mg  qhs prn today.  3) GAD; start trial of prozac 20mg  qd--this has worked in the past for pt.  An After Visit Summary was printed and given to the patient.  FOLLOW UP: Return in about 1 week (around 12/05/2016) for f/u HTN/tob cess.  Signed:  Crissie Sickles, MD            11/29/2016

## 2016-11-28 NOTE — Progress Notes (Signed)
Pre visit review using our clinic review tool, if applicable. No additional management support is needed unless otherwise documented below in the visit note. 

## 2016-11-29 ENCOUNTER — Telehealth: Payer: Self-pay | Admitting: Family Medicine

## 2016-11-29 ENCOUNTER — Encounter: Payer: Self-pay | Admitting: Family Medicine

## 2016-11-29 NOTE — Telephone Encounter (Signed)
Patient states he called yesterday and never received a call back regarding request from insurance company to either change dosage of ambien to 10mg  or submit prior auth for 12.5mg .  Patient requesting a call back today.

## 2016-12-01 NOTE — Telephone Encounter (Signed)
Pls put in PA for ambien 12.5mg  b/c he has tried and failed the plain [place] 10mg  already.-thx

## 2016-12-02 NOTE — Telephone Encounter (Signed)
Pt calling checking status on this.

## 2016-12-03 NOTE — Telephone Encounter (Signed)
Message left on voice mail informing patient of status of PA  Expect to hear answer this afternoon and will call him back as soon as we get an answer.

## 2016-12-04 NOTE — Telephone Encounter (Signed)
Message left on voice mail for patient to return call. PA denied.

## 2016-12-05 ENCOUNTER — Ambulatory Visit (INDEPENDENT_AMBULATORY_CARE_PROVIDER_SITE_OTHER): Payer: Medicare Other | Admitting: Family Medicine

## 2016-12-05 ENCOUNTER — Encounter: Payer: Self-pay | Admitting: Family Medicine

## 2016-12-05 VITALS — BP 159/90 | HR 68 | Temp 98.0°F | Resp 16 | Ht 66.5 in | Wt 202.8 lb

## 2016-12-05 DIAGNOSIS — F17200 Nicotine dependence, unspecified, uncomplicated: Secondary | ICD-10-CM

## 2016-12-05 DIAGNOSIS — I10 Essential (primary) hypertension: Secondary | ICD-10-CM

## 2016-12-05 DIAGNOSIS — Z13220 Encounter for screening for lipoid disorders: Secondary | ICD-10-CM

## 2016-12-05 DIAGNOSIS — F5101 Primary insomnia: Secondary | ICD-10-CM | POA: Diagnosis not present

## 2016-12-05 DIAGNOSIS — F411 Generalized anxiety disorder: Secondary | ICD-10-CM

## 2016-12-05 MED ORDER — VARENICLINE TARTRATE 0.5 MG X 11 & 1 MG X 42 PO MISC: ORAL | 0 refills | Status: DC

## 2016-12-05 MED ORDER — ZALEPLON 10 MG PO CAPS: 10.0000 mg | ORAL_CAPSULE | Freq: Every evening | ORAL | 0 refills | Status: DC | PRN

## 2016-12-05 MED ORDER — IRBESARTAN 300 MG PO TABS: 300.0000 mg | ORAL_TABLET | Freq: Every day | ORAL | 3 refills | Status: DC

## 2016-12-05 NOTE — Progress Notes (Signed)
OFFICE VISIT  12/05/2016   CC:  Chief Complaint  Patient presents with  . Follow-up    HTN and Tobacco Cess., pt is fasting.    HPI:    Patient is a 56 y.o. Caucasian male who presents for f/u uncontrolled HTN, insomnia, tob cessation. Last visit we started him on daily prozac 20mg  for GAD--tolerating this so far. We rx'd ambien CR for insomnia but insurance denied coverage: he has to fail sonata first. HTN: I added irbesartan 150mg  qd last visit and he was to try to monitor bp outside of office a few times. No bp measurements outside of office since last visit.  Tobacco: smokes 2 packs of [place] per day for several decades.  Has tried chantix in remote past and wants to try it again.  He recalls trying wellbutrin as well--no help.  He can't afford nicotine replacement.  ROS: no HA, no dizziness, no CP, no palpitations, no LE swelling.  Past Medical History:  Diagnosis Date  . ADD (attention deficit disorder)    "yeah I had this once" ??  . Anxiety and depression   . Arthritis   . Chronic joint pain   . COPD (chronic obstructive pulmonary disease) ([place])   . Depression   . Frequent headaches   . History of kidney stones   . Hypertension   . Pneumothorax, right   . Polysubstance abuse   . Stroke ([place]) 2012   LUE residual deficit: flexion contracture and weakness of L hand.  Old bilateral cerebral infarcts involving the right frontal and parietal lobes as well as the left occipital lobe  . Venous insufficiency of both lower extremities     Past Surgical History:  Procedure Laterality Date  . CHEST TUBE INSERTION    . LUNG SURGERY    . SHOULDER SURGERY     Multiple rotator cuff surgeries on both sides.  Marland Kitchen VIDEO ASSISTED THORACOSCOPY (VATS) W/TALC PLEUADESIS  2010    Outpatient Medications Prior to Visit  Medication Sig Dispense Refill  . amLODipine (NORVASC) 10 MG tablet Take 1 tablet (10 mg total) by mouth daily. 30 tablet 6  . Doxylamine Succinate, Sleep, (SLEEP  AID PO) Take by mouth.    Marland Kitchen FLUoxetine (PROZAC) 20 MG tablet Take 1 tablet (20 mg total) by mouth daily. 30 tablet 0  . irbesartan (AVAPRO) 150 MG tablet Take 1 tablet (150 mg total) by mouth daily. 30 tablet 6  . zolpidem (AMBIEN CR) 12.5 MG CR tablet Take 1 tablet (12.5 mg total) by mouth at bedtime as needed for sleep. (Patient not taking: Reported on 12/05/2016) 30 tablet 0   No facility-administered medications prior to visit.     No Known Allergies  ROS As per HPI  PE: Blood pressure (!) 159/90, pulse 68, temperature 98 F (36.7 C), temperature source Oral, resp. rate 16, height 5' 6.5" (1.689 m), weight 202 lb 12 oz (92 kg), SpO2 98 %. Gen: Alert, well appearing.  Patient is oriented to person, place, time, and situation. CV: RRR, no m/r/g.   LUNGS: CTA bilat, nonlabored resps, good aeration in all lung fields. EXT: no clubbing or cyanosis.  Trace pitting edema R pretibial region but none on left.    LABS:    Chemistry      Component Value Date/Time   NA 140 11/21/2016 1442   K 4.2 11/21/2016 1442   CL 103 11/21/2016 1442   CO2 26 11/21/2016 1442   BUN 13 11/21/2016 1442   CREATININE 0.92  11/21/2016 1442      Component Value Date/Time   CALCIUM 9.3 11/21/2016 1442   ALKPHOS 113 11/21/2016 1442   AST 19 11/21/2016 1442   ALT 14 11/21/2016 1442   BILITOT 0.4 11/21/2016 1442       IMPRESSION AND PLAN:  1) Uncontrolled HTN: increase irbesartan to 300mg  qd.  Continue other current bp meds. He is likely not going to ever monitor his bp outside the office.  2) Tob dependence/cessation; will do trial of chantix.  Starter pack rx given today.  Therapeutic expectations and side effect profile of medication discussed today.  Patient's questions answered.  3) Insomnia: for insurer reasons, will do trial of sonata 10mg  qhs.  If this is not effective, then we'll retry rx of ambien CR 12.5mg  qhs. He is not a candidate for restoril b/c I am trying to avoid any meds with high  potential for addiction with him.  4) GAD: just started prozac.  Reminded him that this med takes about 3 weeks to begin helping.  Continue 20mg  qd.  5) Screening for hyperlipidemia: ordered today but pt inadvertently left the office before getting this lab draw. Will try to get at next office f/u.  An After Visit Summary was printed and given to the patient.  FOLLOW UP: Return in about 2 weeks (around 12/19/2016) for routine chronic illness f/u.  Signed:  Crissie Sickles, MD           12/05/2016

## 2016-12-05 NOTE — Progress Notes (Signed)
Pre visit review using our clinic review tool, if applicable. No additional management support is needed unless otherwise documented below in the visit note. 

## 2016-12-05 NOTE — Telephone Encounter (Signed)
Patient notified today at office visit that he will have to try [place] before we can get the Ambien approved.  Patient will call back if there is a problem with the medication or if it is not effective.

## 2016-12-20 ENCOUNTER — Encounter: Payer: Self-pay | Admitting: Family Medicine

## 2016-12-20 ENCOUNTER — Ambulatory Visit (INDEPENDENT_AMBULATORY_CARE_PROVIDER_SITE_OTHER): Payer: Medicare Other | Admitting: Family Medicine

## 2016-12-20 VITALS — BP 154/97 | HR 67 | Temp 97.6°F | Resp 16 | Wt 203.0 lb

## 2016-12-20 DIAGNOSIS — I10 Essential (primary) hypertension: Secondary | ICD-10-CM

## 2016-12-20 DIAGNOSIS — F5101 Primary insomnia: Secondary | ICD-10-CM

## 2016-12-20 DIAGNOSIS — F411 Generalized anxiety disorder: Secondary | ICD-10-CM | POA: Diagnosis not present

## 2016-12-20 DIAGNOSIS — F17200 Nicotine dependence, unspecified, uncomplicated: Secondary | ICD-10-CM | POA: Diagnosis not present

## 2016-12-20 DIAGNOSIS — Z13220 Encounter for screening for lipoid disorders: Secondary | ICD-10-CM

## 2016-12-20 LAB — LIPID PANEL
Cholesterol: 112 mg/dL (ref <200)
HDL: 54 mg/dL (ref >40)
LDL Cholesterol: 22 mg/dL (ref <100)
Total CHOL/HDL Ratio: 2.1 Ratio (ref <5.0)
Triglycerides: 178 mg/dL — ABNORMAL HIGH (ref <150)
VLDL: 36 mg/dL — ABNORMAL HIGH (ref <30)

## 2016-12-20 MED ORDER — FLUOXETINE HCL 40 MG PO CAPS: 40.0000 mg | ORAL_CAPSULE | Freq: Every day | ORAL | 1 refills | Status: DC

## 2016-12-20 MED ORDER — FLUOXETINE HCL 40 MG PO CAPS: 40.0000 mg | ORAL_CAPSULE | Freq: Every day | ORAL | 0 refills | Status: DC

## 2016-12-20 MED ORDER — ZOLPIDEM TARTRATE ER 12.5 MG PO TBCR: 12.5000 mg | EXTENDED_RELEASE_TABLET | Freq: Every evening | ORAL | 0 refills | Status: DC | PRN

## 2016-12-20 NOTE — Progress Notes (Signed)
OFFICE VISIT  12/20/2016   CC:  Chief Complaint  Patient presents with  . Follow-up    hypertension   HPI:    Patient is a 56 y.o. Caucasian male who presents for 2 week f/u uncontrolled HTN, insomnia, GAD, tob dependence.  HTN: increased irbesartan to 300mg  qd last visit.  No bp checks anywhere since last visit.  He plans to get bp cuff this weekend.  Insomnia: recent trial of sonata 10mg  qhs-- says this has helped NONE at all.  GAD: started prozac 20mg  about 3 wks ago.  Says "that stuff don't work".  I reminded him we have to be patient with this type of medication.  Tob dependence: started chantix trial 2 wks ago.  He is on day 4 of starter pack.    ROS: no CP, no SOB, no cough, no fever, no GI upset.   Past Medical History:  Diagnosis Date  . ADD (attention deficit disorder)    "yeah I had this once" ??  . Anxiety and depression   . Arthritis   . Chronic joint pain   . COPD (chronic obstructive pulmonary disease) ([place])   . Depression   . Frequent headaches   . History of kidney stones   . Hypertension   . Pneumothorax, right   . Polysubstance abuse   . Stroke ([place]) 2012   LUE residual deficit: flexion contracture and weakness of L hand.  Old bilateral cerebral infarcts involving the right frontal and parietal lobes as well as the left occipital lobe  . Venous insufficiency of both lower extremities     Past Surgical History:  Procedure Laterality Date  . CHEST TUBE INSERTION    . LUNG SURGERY    . SHOULDER SURGERY     Multiple rotator cuff surgeries on both sides.  Marland Kitchen VIDEO ASSISTED THORACOSCOPY (VATS) W/TALC PLEUADESIS  2010    Outpatient Medications Prior to Visit  Medication Sig Dispense Refill  . amLODipine (NORVASC) 10 MG tablet Take 1 tablet (10 mg total) by mouth daily. 30 tablet 6  . Doxylamine Succinate, Sleep, (SLEEP AID PO) Take by mouth.    . irbesartan (AVAPRO) 300 MG tablet Take 1 tablet (300 mg total) by mouth daily. 30 tablet 3  .  varenicline (CHANTIX STARTING MONTH PAK) 0.5 MG X 11 & 1 MG X 42 tablet Take one 0.5 mg tablet by mouth once daily for 3 days, then increase to one 0.5 mg tablet twice daily for 4 days, then increase to one 1 mg tablet twice daily. 53 tablet 0  . FLUoxetine (PROZAC) 20 MG tablet Take 1 tablet (20 mg total) by mouth daily. 30 tablet 0  . zaleplon (SONATA) 10 MG capsule Take 1 capsule (10 mg total) by mouth at bedtime as needed for sleep. 30 capsule 0   No facility-administered medications prior to visit.     No Known Allergies  ROS As per HPI  PE: Blood pressure (!) 154/97, pulse 67, temperature 97.6 F (36.4 C), temperature source Oral, resp. rate 16, weight 203 lb (92.1 kg), SpO2 98 %. Gen: Alert, well appearing.  Patient is oriented to person, place, time, and situation. AFFECT: pleasant, lucid thought and speech. CV: RRR, no m/r/g.   LUNGS: CTA bilat, nonlabored resps, good aeration in all lung fields. EXT: trace pitting edema in R ankle, 1+ pitting edema in L ankle, no cyanosis or clubbing.  LABS:    Chemistry      Component Value Date/Time   NA 140  11/21/2016 1442   K 4.2 11/21/2016 1442   CL 103 11/21/2016 1442   CO2 26 11/21/2016 1442   BUN 13 11/21/2016 1442   CREATININE 0.92 11/21/2016 1442      Component Value Date/Time   CALCIUM 9.3 11/21/2016 1442   ALKPHOS 113 11/21/2016 1442   AST 19 11/21/2016 1442   ALT 14 11/21/2016 1442   BILITOT 0.4 11/21/2016 1442     Lab Results  Component Value Date   CHOL  11/03/2010    188        ATP III CLASSIFICATION:  <200     mg/dL   Desirable  200-239  mg/dL   Borderline High  >=240    mg/dL   High          HDL 44 11/03/2010   LDLCALC (H) 11/03/2010    120        Total Cholesterol/HDL:CHD Risk Coronary Heart Disease Risk Table                     Men   Women  1/2 Average Risk   3.4   3.3  Average Risk       5.0   4.4  2 X Average Risk   9.6   7.1  3 X Average Risk  23.4   11.0        Use the calculated Patient  Ratio above and the CHD Risk Table to determine the patient's CHD Risk.        ATP III CLASSIFICATION (LDL):  <100     mg/dL   Optimal  100-129  mg/dL   Near or Above                    Optimal  130-159  mg/dL   Borderline  160-189  mg/dL   High  >190     mg/dL   Very High   TRIG 119 11/03/2010   CHOLHDL 4.3 11/03/2010   Lab Results  Component Value Date   TSH 1.58 11/21/2016    IMPRESSION AND PLAN:  1) HTN; not ideal control.  I really would like some outside bp data.  He plans on getting a bp cuff for home monitoring this weekend.  Gave pt handout on how to do this correctly.  2) Tob dep: doing ok so far on starter pack of chantix.  3) GAD: no effect from prozac 20 mg qd x 3 wks.  Increase to 40 mg qd.  4) Screening for hyperlipidemia: check FLP today.  5) Insomnia: failed many, many meds, most recently sonata.  Benzo is his option left and I have repeatedly told him I won't rx him this kind of med.  I did agree to rx [employer], which he swears has helped him in the past.  An After Visit Summary was printed and given to the patient.  FOLLOW UP: Return in about 4 weeks (around 01/17/2017) for routine chronic illness f/u.  Signed:  Crissie Sickles, MD           12/20/2016

## 2016-12-23 ENCOUNTER — Telehealth: Payer: Self-pay | Admitting: Family Medicine

## 2016-12-23 NOTE — Telephone Encounter (Signed)
Will resubmit PA, failed Sonata.

## 2016-12-23 NOTE — Telephone Encounter (Signed)
**  Remind patient they can make refill requests via MyChart**  Medication refill request (Name & Dosage): Ambien CR 12.5 controlled release    Preferred [facility] & [facility] [address] [facility] [place] [facility] [place]      Other comments (if applicable): Patient said the pharmacy told him to have [facility] call them about the refill. He also prefers to use the [place] [facility] if the prescription he can pick it up from a pharmacy.

## 2016-12-24 NOTE — Telephone Encounter (Signed)
Patient called to inquire further about his Ambien rx and his insurance approval. Transferred call to Mickel Baas to advise patient.

## 2016-12-25 NOTE — Telephone Encounter (Signed)
Patient notified that we are still waiting on insurance response.  Patient notified and verbalized understanding.

## 2016-12-25 NOTE — Telephone Encounter (Signed)
Patient called to check on update on Ambien request and insurance approval. I advised patient that Starla Link is still awaiting a response from insurance according to the last telephone note. Patient verbalized understanding.

## 2016-12-30 NOTE — Telephone Encounter (Signed)
Medication approved per [employer]). They will call patient and inform them of decision.[facility] was [facility] and medication was put through to be filled.

## 2017-01-05 ENCOUNTER — Other Ambulatory Visit: Payer: Self-pay | Admitting: Family Medicine

## 2017-01-06 ENCOUNTER — Other Ambulatory Visit: Payer: Self-pay | Admitting: Family Medicine

## 2017-01-06 MED ORDER — VARENICLINE TARTRATE 1 MG PO TABS: 1.0000 mg | ORAL_TABLET | Freq: Two times a day (BID) | ORAL | 1 refills | Status: DC

## 2017-01-06 NOTE — Telephone Encounter (Signed)
Continuing dose rx sent in to pharmacy today.

## 2017-01-13 ENCOUNTER — Telehealth: Payer: Self-pay

## 2017-01-13 ENCOUNTER — Other Ambulatory Visit: Payer: Self-pay | Admitting: *Deleted

## 2017-01-13 MED ORDER — IRBESARTAN 300 MG PO TABS: 300.0000 mg | ORAL_TABLET | Freq: Every day | ORAL | 1 refills | Status: DC

## 2017-01-13 NOTE — Telephone Encounter (Signed)
[facility] supply.  RF request for irbesartan LOV: 12/20/16 Next ov: 01/17/17 Last written: 12/05/16 #30 w/ 3RF

## 2017-01-13 NOTE — Telephone Encounter (Signed)
LM requesting call back regarding AWV. Requesting patient to have AWV with [facility] on 01/17/17 @ 2pm prior to appointment with PCP.

## 2017-01-16 NOTE — Telephone Encounter (Signed)
LM requesting CB regarding AWV. Requesting patient to have AWV with [facility] on 01/17/17 @ 3:30pm after appointment with PCP.

## 2017-01-17 ENCOUNTER — Encounter: Payer: Self-pay | Admitting: Family Medicine

## 2017-01-17 ENCOUNTER — Ambulatory Visit (INDEPENDENT_AMBULATORY_CARE_PROVIDER_SITE_OTHER): Payer: Medicare Other | Admitting: Family Medicine

## 2017-01-17 VITALS — BP 183/101 | HR 70 | Temp 98.0°F | Resp 16 | Ht 66.5 in | Wt 190.5 lb

## 2017-01-17 DIAGNOSIS — F17200 Nicotine dependence, unspecified, uncomplicated: Secondary | ICD-10-CM

## 2017-01-17 DIAGNOSIS — F5101 Primary insomnia: Secondary | ICD-10-CM

## 2017-01-17 DIAGNOSIS — F419 Anxiety disorder, unspecified: Secondary | ICD-10-CM

## 2017-01-17 DIAGNOSIS — F329 Major depressive disorder, single episode, unspecified: Secondary | ICD-10-CM

## 2017-01-17 DIAGNOSIS — I10 Essential (primary) hypertension: Secondary | ICD-10-CM | POA: Diagnosis not present

## 2017-01-17 DIAGNOSIS — F418 Other specified anxiety disorders: Secondary | ICD-10-CM | POA: Diagnosis not present

## 2017-01-17 MED ORDER — HYDROCHLOROTHIAZIDE 25 MG PO TABS: 25.0000 mg | ORAL_TABLET | Freq: Every day | ORAL | 0 refills | Status: DC

## 2017-01-17 MED ORDER — DULOXETINE HCL 30 MG PO CPEP: 30.0000 mg | ORAL_CAPSULE | Freq: Every day | ORAL | 0 refills | Status: DC

## 2017-01-17 MED ORDER — ZOLPIDEM TARTRATE ER 12.5 MG PO TBCR: 12.5000 mg | EXTENDED_RELEASE_TABLET | Freq: Every evening | ORAL | 5 refills | Status: DC | PRN

## 2017-01-17 NOTE — Patient Instructions (Signed)
Stop your fluoxetine (prozac). The next day, start duloxetine (sent rx to pharmacy today).

## 2017-01-17 NOTE — Progress Notes (Signed)
Pre visit review using our clinic review tool, if applicable. No additional management support is needed unless otherwise documented below in the visit note. 

## 2017-01-17 NOTE — Progress Notes (Signed)
OFFICE VISIT  01/17/2017   CC:  Chief Complaint  Patient presents with  . Follow-up    RCI, pt is fasting.    HPI:    Patient is a 56 y.o. Caucasian male who presents for 1 mo f/u HTN, insomnia, GAD.  HTN: last visit did not change/add med b/c I wanted home bp monitoring for help with this decision. He did not get a bp machine or check bp since last visit.  Taking bp med daily.  Insomnia: we had exhausted all sleep aids except benzo and I refuse to rx this for him. I did rx ambien CR b/c he stated this med had helped him significantly in the past. This is helping him sleep and he requests RF today.  Anxiety: prozac not helping.  Says he has always been very anxious, some depressed mood and anhedonia as well.  Poor concentration.  Drinks alcohol: a couple of shots of whiskey qod or so. No SI or HI.  Tob cess: was in the midst of beginning chantix at last f/u--says he has cut back from 2 ppd to 1 ppd.  Past Medical History:  Diagnosis Date  . ADD (attention deficit disorder)    "yeah I had this once" ??  . Anxiety and depression   . Arthritis   . Chronic joint pain   . COPD (chronic obstructive pulmonary disease) ([place])   . Depression   . Frequent headaches   . History of kidney stones   . Hypertension   . Pneumothorax, right   . Polysubstance abuse   . Stroke ([place]) 2012   LUE residual deficit: flexion contracture and weakness of L hand.  Old bilateral cerebral infarcts involving the right frontal and parietal lobes as well as the left occipital lobe  . Venous insufficiency of both lower extremities     Past Surgical History:  Procedure Laterality Date  . CHEST TUBE INSERTION    . LUNG SURGERY    . SHOULDER SURGERY     Multiple rotator cuff surgeries on both sides.  Marland Kitchen VIDEO ASSISTED THORACOSCOPY (VATS) W/TALC PLEUADESIS  2010    Outpatient Medications Prior to Visit  Medication Sig Dispense Refill  . irbesartan (AVAPRO) 300 MG tablet Take 1 tablet (300 mg total)  by mouth daily. 90 tablet 1  . varenicline (CHANTIX CONTINUING MONTH PAK) 1 MG tablet Take 1 tablet (1 mg total) by mouth 2 (two) times daily. 60 tablet 1  . FLUoxetine (PROZAC) 40 MG capsule Take 1 capsule (40 mg total) by mouth daily. 30 capsule 0  . zolpidem (AMBIEN CR) 12.5 MG CR tablet Take 1 tablet (12.5 mg total) by mouth at bedtime as needed for sleep. 30 tablet 0  . amLODipine (NORVASC) 10 MG tablet Take 1 tablet (10 mg total) by mouth daily. (Patient not taking: Reported on 01/17/2017) 30 tablet 6  . Doxylamine Succinate, Sleep, (SLEEP AID PO) Take by mouth.    . varenicline (CHANTIX STARTING MONTH PAK) 0.5 MG X 11 & 1 MG X 42 tablet Take one 0.5 mg tablet by mouth once daily for 3 days, then increase to one 0.5 mg tablet twice daily for 4 days, then increase to one 1 mg tablet twice daily. (Patient not taking: Reported on 01/17/2017) 53 tablet 0   No facility-administered medications prior to visit.     No Known Allergies  ROS As per HPI  PE: Blood pressure (!) 183/101, pulse 70, temperature 98 F (36.7 C), temperature source Oral, resp. rate 16,  height 5' 6.5" (1.689 m), weight 190 lb 8 oz (86.4 kg), SpO2 99 %. Gen: Alert, well appearing.  Patient is oriented to person, place, time, and situation. CV: RRR, no m/r/g.   LUNGS: CTA bilat, nonlabored resps, good aeration in all lung fields.   LABS:    Chemistry      Component Value Date/Time   NA 140 11/21/2016 1442   K 4.2 11/21/2016 1442   CL 103 11/21/2016 1442   CO2 26 11/21/2016 1442   BUN 13 11/21/2016 1442   CREATININE 0.92 11/21/2016 1442      Component Value Date/Time   CALCIUM 9.3 11/21/2016 1442   ALKPHOS 113 11/21/2016 1442   AST 19 11/21/2016 1442   ALT 14 11/21/2016 1442   BILITOT 0.4 11/21/2016 1442      IMPRESSION AND PLAN:  1) HTN, uncontrolled.  Still no home monitoring, says when he gets paid again he'll try to get bp cuff. Will add hctz 25mg  qd today.  Continue irbesartan 300mg  qd.  2)  Anxiety and depression: failed adequate trial of prozac.  D/c prozac.  Start duloxetine 30mg  qd.  3) Insomnia: much better on ambien CR 12.5mg  qhs.  Rx'd this med today, #30, RF x 5.  4) Tobacco dependence/cessation: he is cutting back while on chantix.  Encouraged him to [place].  An After Visit Summary was printed and given to the patient.  FOLLOW UP: Return in about 3 weeks (around 02/07/2017) for f/u HTN and anxiety/mood.  Signed:  Crissie Sickles, MD           01/17/2017

## 2017-01-18 ENCOUNTER — Other Ambulatory Visit: Payer: Self-pay | Admitting: Family Medicine

## 2017-01-28 ENCOUNTER — Emergency Department: Payer: Medicare Other

## 2017-01-28 ENCOUNTER — Emergency Department
Admission: EM | Admit: 2017-01-28 | Discharge: 2017-01-29 | Payer: Medicare Other | Attending: Emergency Medicine | Admitting: Emergency Medicine

## 2017-01-28 ENCOUNTER — Encounter: Payer: Self-pay | Admitting: Emergency Medicine

## 2017-01-28 DIAGNOSIS — I6200 Nontraumatic subdural hemorrhage, unspecified: Secondary | ICD-10-CM | POA: Diagnosis not present

## 2017-01-28 DIAGNOSIS — S065XAA Traumatic subdural hemorrhage with loss of consciousness status unknown, initial encounter: Secondary | ICD-10-CM

## 2017-01-28 DIAGNOSIS — J449 Chronic obstructive pulmonary disease, unspecified: Secondary | ICD-10-CM | POA: Insufficient documentation

## 2017-01-28 DIAGNOSIS — R58 Hemorrhage, not elsewhere classified: Secondary | ICD-10-CM | POA: Diagnosis not present

## 2017-01-28 DIAGNOSIS — I10 Essential (primary) hypertension: Secondary | ICD-10-CM | POA: Diagnosis not present

## 2017-01-28 DIAGNOSIS — Z9989 Dependence on other enabling machines and devices: Secondary | ICD-10-CM | POA: Diagnosis not present

## 2017-01-28 DIAGNOSIS — S065X0A Traumatic subdural hemorrhage without loss of consciousness, initial encounter: Secondary | ICD-10-CM | POA: Diagnosis not present

## 2017-01-28 DIAGNOSIS — S065X9A Traumatic subdural hemorrhage with loss of consciousness of unspecified duration, initial encounter: Secondary | ICD-10-CM

## 2017-01-28 DIAGNOSIS — R29898 Other symptoms and signs involving the musculoskeletal system: Secondary | ICD-10-CM

## 2017-01-28 DIAGNOSIS — F17210 Nicotine dependence, cigarettes, uncomplicated: Secondary | ICD-10-CM | POA: Diagnosis not present

## 2017-01-28 DIAGNOSIS — M6281 Muscle weakness (generalized): Secondary | ICD-10-CM | POA: Diagnosis not present

## 2017-01-28 DIAGNOSIS — Z79899 Other long term (current) drug therapy: Secondary | ICD-10-CM | POA: Insufficient documentation

## 2017-01-28 DIAGNOSIS — R531 Weakness: Secondary | ICD-10-CM | POA: Diagnosis present

## 2017-01-28 LAB — CBC
HEMATOCRIT: 40.8 % (ref 40.0–52.0)
HEMOGLOBIN: 14.2 g/dL (ref 13.0–18.0)
MCH: 34.2 pg — ABNORMAL HIGH (ref 26.0–34.0)
MCHC: 34.7 g/dL (ref 32.0–36.0)
MCV: 98.5 fL (ref 80.0–100.0)
Platelets: 308 10*3/uL (ref 150–440)
RBC: 4.14 MIL/uL — AB (ref 4.40–5.90)
RDW: 14.1 % (ref 11.5–14.5)
WBC: 8.1 10*3/uL (ref 3.8–10.6)

## 2017-01-28 LAB — COMPREHENSIVE METABOLIC PANEL
ALBUMIN: 4.3 g/dL (ref 3.5–5.0)
ALK PHOS: 86 U/L (ref 38–126)
ALT: 11 U/L — ABNORMAL LOW (ref 17–63)
AST: 19 U/L (ref 15–41)
Anion gap: 8 (ref 5–15)
BILIRUBIN TOTAL: 0.8 mg/dL (ref 0.3–1.2)
BUN: 30 mg/dL — AB (ref 6–20)
CALCIUM: 9.2 mg/dL (ref 8.9–10.3)
CO2: 27 mmol/L (ref 22–32)
CREATININE: 1.45 mg/dL — AB (ref 0.61–1.24)
Chloride: 99 mmol/L — ABNORMAL LOW (ref 101–111)
GFR calc Af Amer: >60 mL/min (ref >60)
GFR, EST NON AFRICAN AMERICAN: 52 mL/min — AB (ref >60)
GLUCOSE: 104 mg/dL — AB (ref 65–99)
POTASSIUM: 3.8 mmol/L (ref 3.5–5.1)
Sodium: 134 mmol/L — ABNORMAL LOW (ref 135–145)
Total Protein: 7.9 g/dL (ref 6.5–8.1)

## 2017-01-28 LAB — DIFFERENTIAL
BASOS ABS: 0.1 10*3/uL (ref 0–0.1)
Basophils Relative: 1 %
Eosinophils Absolute: 0.3 10*3/uL (ref 0–0.7)
Eosinophils Relative: 3 %
LYMPHS ABS: 1.3 10*3/uL (ref 1.0–3.6)
LYMPHS PCT: 17 %
MONOS PCT: 6 %
Monocytes Absolute: 0.5 10*3/uL (ref 0.2–1.0)
NEUTROS PCT: 73 %
Neutro Abs: 5.9 10*3/uL (ref 1.4–6.5)

## 2017-01-28 LAB — ETHANOL

## 2017-01-28 LAB — PROTIME-INR
INR: 0.93
Prothrombin Time: 12.5 seconds (ref 11.4–15.2)

## 2017-01-28 LAB — APTT: APTT: 33 s (ref 24–36)

## 2017-01-28 LAB — TROPONIN I: Troponin I: <0.03 ng/mL (ref <0.03)

## 2017-01-28 MED ORDER — SODIUM CHLORIDE 0.9 % IV SOLN
1000.0000 mg | Freq: Once | INTRAVENOUS | Status: AC
  Administered 2017-01-28: 1000 mg via INTRAVENOUS
  Filled 2017-01-28: qty 10

## 2017-01-28 NOTE — ED Notes (Signed)
Called [facility] for transfer at 1020 informed [facility] on divert, called [facility] transfer [place] waiting for Dr. Wende Mott to call back.

## 2017-01-28 NOTE — ED Notes (Signed)
Accepted to [facility]

## 2017-01-28 NOTE — ED Notes (Signed)
Code stroke called to [facility] [id number]

## 2017-01-28 NOTE — ED Triage Notes (Signed)
Pt arrived to the ED accompanied by his son for complaints of "passing out several times." Pt's son states that the Pt has a history of strokes and that he is acting just like when the Pt had strokes in the past. Pt is AOx1 with notable confusion during triage. ED MD notified.

## 2017-01-28 NOTE — ED Provider Notes (Signed)
[facility] Emergency Department Provider Note  ____________________________________________   First MD Initiated Contact with Patient 01/28/17 2133     (approximate)  I have reviewed the triage vital signs and the nursing notes.   HISTORY  Chief Complaint Code Stroke   HPI [name] is a 56 y.o. male with a history of stroke who is presenting with left upper extremity weakness which is worse than his baseline. He says that he has baseline weakness to his left upper and lower extremities after stroke in the past. He denies being on any medications at this time including any blood thinners. He is presenting today with a diffuse headache over the past several days and says that he has "passed out" several times. He denies any preceding symptoms such as chest pain or palpitations. He is here with his son who also says that he fell about 2 weeks ago, hitting the right side of his head. He says that he has had multiple strokes in the past and this feels similar to his previous strokes.   Past Medical History:  Diagnosis Date  . ADD (attention deficit disorder)    "yeah I had this once" ??  . Anxiety and depression   . Arthritis   . Chronic joint pain   . COPD (chronic obstructive pulmonary disease) ([place])   . Depression   . Frequent headaches   . History of kidney stones   . Hypertension   . Pneumothorax, right   . Polysubstance abuse   . Stroke ([place]) 2012   LUE residual deficit: flexion contracture and weakness of L hand.  Old bilateral cerebral infarcts involving the right frontal and parietal lobes as well as the left occipital lobe  . Venous insufficiency of both lower extremities     Patient Active Problem List   Diagnosis Date Noted  . TOBACCO ABUSE 11/08/2008  . C O P D 11/08/2008  . Pneumothorax 10/19/2008  . PNEUMOTHORAX 10/19/2008    Past Surgical History:  Procedure Laterality Date  . CHEST TUBE INSERTION    . LUNG SURGERY    .  SHOULDER SURGERY     Multiple rotator cuff surgeries on both sides.  Marland Kitchen VIDEO ASSISTED THORACOSCOPY (VATS) W/TALC PLEUADESIS  2010    Prior to Admission medications   Medication Sig Start Date End Date Taking? Authorizing Provider  DULoxetine (CYMBALTA) 30 MG capsule Take 1 capsule (30 mg total) by mouth daily. 01/17/17   Tammi Sou, MD  hydrochlorothiazide (HYDRODIURIL) 25 MG tablet Take 1 tablet (25 mg total) by mouth daily. 01/17/17   Tammi Sou, MD  irbesartan (AVAPRO) 300 MG tablet Take 1 tablet (300 mg total) by mouth daily. 01/13/17   Tammi Sou, MD  varenicline (CHANTIX CONTINUING MONTH PAK) 1 MG tablet Take 1 tablet (1 mg total) by mouth 2 (two) times daily. 01/06/17   Tammi Sou, MD  zolpidem (AMBIEN CR) 12.5 MG CR tablet Take 1 tablet (12.5 mg total) by mouth at bedtime as needed for sleep. 01/17/17   Tammi Sou, MD    Allergies Patient has no known allergies.  Family History  Problem Relation Age of Onset  . Arthritis Mother   . Heart disease Father   . Hypertension Father   . Breast cancer Maternal Aunt   . Alcohol abuse Maternal Uncle   . Lung cancer Maternal Aunt     Social History Social History  Substance Use Topics  . Smoking status: Current Every  Day Smoker    Packs/day: 1.00    Years: 43.00    Types: Cigarettes  . Smokeless tobacco: Never Used  . Alcohol use Yes     Comment: [place] 1pt a week    Review of Systems Constitutional: No fever/chills Eyes: No visual changes. ENT: No sore throat. Cardiovascular: Denies chest pain. Respiratory: Denies shortness of breath. Gastrointestinal: No abdominal pain.  No nausea, no vomiting.  No diarrhea.  No constipation. Genitourinary: Negative for dysuria. Musculoskeletal: Negative for back pain. Skin: Negative for rash. Neurological: Negative for  numbness.  10-point ROS otherwise negative.  ____________________________________________   PHYSICAL EXAM:  VITAL SIGNS: ED Triage  Vitals  Enc Vitals Group     BP 01/28/17 2120 106/74     Pulse Rate 01/28/17 2120 70     Resp 01/28/17 2120 18     Temp 01/28/17 2120 97.9 F (36.6 C)     Temp Source 01/28/17 2120 Oral     SpO2 01/28/17 2118 98 %     Weight 01/28/17 2120 191 lb (86.6 kg)     Height 01/28/17 2120 5\' 7"  (1.702 m)     Head Circumference --      Peak Flow --      Pain Score 01/28/17 2119 0     Pain Loc --      Pain Edu? --      Excl. in [place]? --     Constitutional: Alert and oriented. Well appearing and in no acute distress. Eyes: Conjunctivae are normal. PERRL. EOMI. Head: Atraumatic. Nose: No congestion/rhinnorhea. Mouth/Throat: Mucous membranes are moist.   Neck: No stridor.  No tenderness to the midline cervical spine. No deformity or step-off. Cardiovascular: Normal rate, regular rhythm. Grossly normal heart sounds.   Respiratory: Normal respiratory effort.  No retractions. Lungs CTAB. Gastrointestinal: Soft and nontender. No distention. No abdominal bruits. No CVA tenderness. Musculoskeletal: No lower extremity tenderness nor edema.  No joint effusions. Neurologic:  Normal speech and language. Strong 4/5 strength to the left upper extremity compared with 5 out of 5 strength to the right upper extremity. No facial droop. Skin:  Skin is warm, dry and intact. No rash noted. Psychiatric: Mood and affect are normal. Speech and behavior are normal.  NIH Stroke Scale  Person Administering Scale: Doran Stabler  Administer stroke scale items in the order listed. Record performance in each category after each subscale exam. Do not go back and change scores. Follow directions provided for each exam technique. Scores should reflect what the patient does, not what the clinician thinks the patient can do. The clinician should record answers while administering the exam and work quickly. Except where indicated, the patient should not be coached (i.e., repeated requests to patient to make a special  effort).   1a  Level of consciousness: 0=alert; keenly responsive  1b. LOC questions:  0=Performs both tasks correctly  1c. LOC commands: 0=Performs both tasks correctly  2.  Best Gaze: 0=normal  3.  Visual: 0=No visual loss  4. Facial Palsy: 0=Normal symmetric movement  5a.  Motor left arm: 0=No drift, limb holds 90 (or 45) degrees for full 10 seconds  5b.  Motor right arm: 0=No drift, limb holds 90 (or 45) degrees for full 10 seconds  6a. motor left leg: 0=No drift, limb holds 90 (or 45) degrees for full 10 seconds  6b  Motor right leg:  0=No drift, limb holds 90 (or 45) degrees for full 10 seconds  7. Limb  Ataxia: 0=Absent  8.  Sensory: 0=Normal; no sensory loss  9. Best Language:  0=No aphasia, normal  10. Dysarthria: 0=Normal  11. Extinction and Inattention: 0=No abnormality  12. Distal motor function: 0=Normal   Total:   0   ____________________________________________   LABS (all labs ordered are listed, but only abnormal results are displayed)  Labs Reviewed  CBC - Abnormal; Notable for the following:       Result Value   RBC 4.14 (*)    MCH 34.2 (*)    All other components within normal limits  COMPREHENSIVE METABOLIC PANEL - Abnormal; Notable for the following:    Sodium 134 (*)    Chloride 99 (*)    Glucose, Bld 104 (*)    BUN 30 (*)    Creatinine, Ser 1.45 (*)    ALT 11 (*)    GFR calc non Af Amer 52 (*)    All other components within normal limits  ETHANOL  DIFFERENTIAL  TROPONIN I  PROTIME-INR  APTT  RAPID URINE DRUG SCREEN, HOSP PERFORMED  URINALYSIS, ROUTINE W REFLEX MICROSCOPIC  I-STAT TROPOININ, ED   ____________________________________________  EKG  ED ECG REPORT I, Doran Stabler, the attending physician, personally viewed and interpreted this ECG.   Date: 01/28/2017  EKG Time: 2139  Rate: 63  Rhythm: normal sinus rhythm  Axis: Normal  Intervals:none  ST&T Change: No ST segment elevation or depression. No abnormal T-wave  inversion.  ____________________________________________  RADIOLOGY  CT Head Wo Contrast (Accession [id number]) (Order [id number])  Imaging  Date: 01/28/2017 Department: [facility] EMERGENCY DEPARTMENT Released By: Irena Reichmann, RN (auto-released) Authorizing: Orbie Pyo, MD  Exam Information   Status Exam Begun  Exam Ended   Final [99] 01/28/2017 9:32 PM 01/28/2017 9:33 PM  PACS Images   Show images for CT Head Wo Contrast  Study Result   CLINICAL DATA:  Confusion and syncope  EXAM: CT HEAD WITHOUT CONTRAST  TECHNIQUE: Contiguous axial images were obtained from the base of the skull through the vertex without intravenous contrast.  COMPARISON:  August 07, 2014  FINDINGS: Brain: There is mild underlying atrophy. There is a focal subdural hematoma on the right with subacute appearing hemorrhage. This subdural hematoma has a maximum thickness of 12 mm. There is moderate mass effect on the right frontal, temporal, and parietal lobes without appreciable midline shift. There is no other extra-axial fluid collections.  There is no acute hemorrhage intra axially. There is no mass intraaxially. There is an old infarct involving the medial aspect of the left occipital lobe extending to the parietal -occipital junction on the left. There is evidence of a prior infarct in the posterior right frontal lobe. There is evidence of a prior infarct at the junction of the posterior right temporal and parietal lobes, stable. There is stable patchy periventricular small vessel disease. No acute infarct is evident.  Vascular: There is no hyperdense vessel. There is no appreciable vascular calcification.  Skull: The bony calvarium appears intact.  Sinuses/Orbits: There is mucosal thickening with opacification in multiple ethmoid air cells bilaterally. Other visualized paranasal sinuses are clear. Visualized orbits appear symmetric  bilaterally.  Other: Mastoid air cells which are visualized are clear bilaterally.  IMPRESSION: Subdural hematoma on the right with what appears to be subacute hemorrhage. There is moderate mass effect on the right frontal, temporal, and parietal lobes with a maximum thickness of this subdural hematoma of 12 mm. No midline shift. No other  extra-axial fluid. No acute intra-axial hemorrhage evident.  Old prior infarcts as noted above. There is patchy periventricular small vessel disease. Mild underlying atrophy. No acute infarct is demonstrable on this study.  Areas of ethmoid sinus disease noted.  Critical Value/emergent results were called by telephone at the time of interpretation on 01/28/2017 at 9:41 pm to Dr. Harvest Dark , who verbally acknowledged these results.   Electronically Signed   By: Lowella Grip III M.D.   On: 01/28/2017 21:42     ____________________________________________   PROCEDURES  Procedure(s) performed:   Procedures  Critical Care performed:  CRITICAL CARE Performed by: Doran Stabler   Total critical care time: 35 minutes  Critical care time was exclusive of separately billable procedures and treating other patients.  Critical care was necessary to treat or prevent imminent or life-threatening deterioration.  Critical care was time spent personally by me on the following activities: development of treatment plan with patient and/or surrogate as well as nursing, discussions with consultants, evaluation of patient's response to treatment, examination of patient, obtaining history from patient or surrogate, ordering and performing treatments and interventions, ordering and review of laboratory studies, ordering and review of radiographic studies, pulse oximetry and re-evaluation of patient's condition.  ____________________________________________   INITIAL IMPRESSION / ASSESSMENT AND PLAN / ED COURSE  Pertinent labs &  imaging results that were available during my care of the patient were reviewed by me and considered in my medical decision making (see chart for details).  ----------------------------------------- 11:46 PM on 01/28/2017 -----------------------------------------  Patient initially called a cold stroke was evaluated by the specialist on call neurologist. However, due to the finding of the subdural hemorrhage the patient will need to be transported to a tertiary care center. The patient has been accepted to [facility] under the care of Dr. Wende Mott.  The patient and family are aware of the need for transfer. The patient was dosed one dose of Keppra. Patient with symptoms possibly related to subdural blood also with possible acute stroke. Patient is not a TPA candidate. We'll need further workup at [facility].      ____________________________________________   FINAL CLINICAL IMPRESSION(S) / ED DIAGNOSES  Subdural hemorrhage.    NEW MEDICATIONS STARTED DURING THIS VISIT:  New Prescriptions   No medications on file     Note:  This document was prepared using Dragon voice recognition software and may include unintentional dictation errors.    Orbie Pyo, MD 01/28/17 [phone]

## 2017-01-29 DIAGNOSIS — S065X9A Traumatic subdural hemorrhage with loss of consciousness of unspecified duration, initial encounter: Secondary | ICD-10-CM

## 2017-01-29 DIAGNOSIS — G9389 Other specified disorders of brain: Secondary | ICD-10-CM | POA: Diagnosis not present

## 2017-01-29 DIAGNOSIS — I6202 Nontraumatic subacute subdural hemorrhage: Secondary | ICD-10-CM | POA: Diagnosis not present

## 2017-01-29 DIAGNOSIS — S065XAA Traumatic subdural hemorrhage with loss of consciousness status unknown, initial encounter: Secondary | ICD-10-CM

## 2017-01-29 DIAGNOSIS — I6200 Nontraumatic subdural hemorrhage, unspecified: Secondary | ICD-10-CM | POA: Diagnosis not present

## 2017-01-29 DIAGNOSIS — I6203 Nontraumatic chronic subdural hemorrhage: Secondary | ICD-10-CM | POA: Diagnosis not present

## 2017-01-29 HISTORY — DX: Traumatic subdural hemorrhage with loss of consciousness status unknown, initial encounter: S06.5XAA

## 2017-01-29 HISTORY — DX: Traumatic subdural hemorrhage with loss of consciousness of unspecified duration, initial encounter: S06.5X9A

## 2017-01-29 LAB — CBC AND DIFFERENTIAL
HEMATOCRIT: 39 % — AB (ref 41–53)
Hemoglobin: 13 g/dL — AB (ref 13.5–17.5)
PLATELETS: 343 10*3/uL (ref 150–399)
WBC: 7.2 10^3/mL

## 2017-01-29 LAB — BASIC METABOLIC PANEL
BUN: 28 mg/dL — AB (ref 4–21)
Creatinine: 1.1 mg/dL (ref <=1.3)
Potassium: 3.6 mmol/L (ref 3.4–5.3)
SODIUM: 134 mmol/L — AB (ref 137–147)

## 2017-02-07 ENCOUNTER — Ambulatory Visit: Payer: Medicare Other | Admitting: Family Medicine

## 2017-02-07 NOTE — Progress Notes (Deleted)
OFFICE VISIT  02/07/2017   CC: No chief complaint on file.    HPI:    Patient is a 56 y.o. Caucasian male who presents for 3 week f/u uncontrolled HTN. Added hctz 25mg  qd last visit.  He has not been able to get bp cuff in order to monitor bp at home. Also, for ongoing anxiety/depression, he was not improved on prozac, so we d/c'd this med last visit and started duloxetine 30mg  qd.   Past Medical History:  Diagnosis Date  . ADD (attention deficit disorder)    "yeah I had this once" ??  . Anxiety and depression   . Arthritis   . Chronic joint pain   . COPD (chronic obstructive pulmonary disease) ([place])   . Depression   . Frequent headaches   . History of kidney stones   . Hypertension   . Pneumothorax, right   . Polysubstance abuse   . Stroke ([place]) 2012   LUE residual deficit: flexion contracture and weakness of L hand.  Old bilateral cerebral infarcts involving the right frontal and parietal lobes as well as the left occipital lobe  . Venous insufficiency of both lower extremities     Past Surgical History:  Procedure Laterality Date  . CHEST TUBE INSERTION    . LUNG SURGERY    . SHOULDER SURGERY     Multiple rotator cuff surgeries on both sides.  Marland Kitchen VIDEO ASSISTED THORACOSCOPY (VATS) W/TALC PLEUADESIS  2010    Outpatient Medications Prior to Visit  Medication Sig Dispense Refill  . DULoxetine (CYMBALTA) 30 MG capsule Take 1 capsule (30 mg total) by mouth daily. 30 capsule 0  . hydrochlorothiazide (HYDRODIURIL) 25 MG tablet Take 1 tablet (25 mg total) by mouth daily. 30 tablet 0  . irbesartan (AVAPRO) 300 MG tablet Take 1 tablet (300 mg total) by mouth daily. 90 tablet 1  . varenicline (CHANTIX CONTINUING MONTH PAK) 1 MG tablet Take 1 tablet (1 mg total) by mouth 2 (two) times daily. 60 tablet 1  . zolpidem (AMBIEN CR) 12.5 MG CR tablet Take 1 tablet (12.5 mg total) by mouth at bedtime as needed for sleep. 30 tablet 5   No facility-administered medications prior to  visit.     No Known Allergies  ROS As per HPI  PE: There were no vitals taken for this visit. ***  LABS:  ***  IMPRESSION AND PLAN:  No problem-specific Assessment & Plan notes found for this encounter.   FOLLOW UP: No Follow-up on file.

## 2017-02-12 ENCOUNTER — Encounter: Payer: Self-pay | Admitting: Family Medicine

## 2017-02-12 ENCOUNTER — Other Ambulatory Visit: Payer: Self-pay | Admitting: Family Medicine

## 2017-02-12 ENCOUNTER — Ambulatory Visit (INDEPENDENT_AMBULATORY_CARE_PROVIDER_SITE_OTHER): Payer: Medicare Other | Admitting: Family Medicine

## 2017-02-12 VITALS — BP 124/68 | HR 76 | Temp 97.8°F | Resp 16 | Ht 66.5 in | Wt 181.8 lb

## 2017-02-12 DIAGNOSIS — M62432 Contracture of muscle, left forearm: Secondary | ICD-10-CM | POA: Diagnosis not present

## 2017-02-12 DIAGNOSIS — S065XAA Traumatic subdural hemorrhage with loss of consciousness status unknown, initial encounter: Secondary | ICD-10-CM

## 2017-02-12 DIAGNOSIS — Z8673 Personal history of transient ischemic attack (TIA), and cerebral infarction without residual deficits: Secondary | ICD-10-CM

## 2017-02-12 DIAGNOSIS — I10 Essential (primary) hypertension: Secondary | ICD-10-CM

## 2017-02-12 DIAGNOSIS — I6200 Nontraumatic subdural hemorrhage, unspecified: Secondary | ICD-10-CM | POA: Diagnosis not present

## 2017-02-12 DIAGNOSIS — F411 Generalized anxiety disorder: Secondary | ICD-10-CM

## 2017-02-12 DIAGNOSIS — S065X9A Traumatic subdural hemorrhage with loss of consciousness of unspecified duration, initial encounter: Secondary | ICD-10-CM

## 2017-02-12 MED ORDER — DULOXETINE HCL 60 MG PO CPEP: 60.0000 mg | ORAL_CAPSULE | Freq: Every day | ORAL | 1 refills | Status: DC

## 2017-02-12 NOTE — Progress Notes (Signed)
Pre visit review using our clinic review tool, if applicable. No additional management support is needed unless otherwise documented below in the visit note. 

## 2017-02-12 NOTE — Progress Notes (Signed)
OFFICE VISIT  02/12/2017   CC:  Chief Complaint  Patient presents with  . Follow-up    HTN and Anxiety/Mood   HPI:    Patient is a 56 y.o. Caucasian male who presents for 3 week f/u uncontrolled hypertension and GAD. Last visit I added hctz 25mg  qd.  He has still not purchased a bp cuff.  No recall of any bp checks at [facility].   For anxiety, I started him on duloxetine 30mg  qd at last visit.  He does not feel like this has helped his anxiety any.  He is still having trouble sleeping, even on the [place] CR which he said has helped well in the past.  Since I last saw him he has been dx'd with a right subdural hematoma after he presented to the ED 01/28/17 with syncopal episodes and HAs.  He also felt increase in his baseline LUE and LLE weakness.  Their was some vague history that he had fallen and possibly hit his head prior to these events. Head CT was done--see below.  Of note, ED notes indicate pt told them he was on NO MEDS. After subdural hematoma dx'd with this imaging, he was transferred to [facility].  No records available to me at this time from [facility].  Pt does report that he did not have to have any surgery while at [facility].  Reports mild constant occipital headache.  No vision changes.  Ears ringing constantly. He feels like he is at his baseline regarding left sided weakness in arm/leg. Complains again of uncomfortable tightness in left arm from elbow down into hand, has had this ever since having a CVA that left residual mild L spastic hemiparesis.  He asks for a muscle relaxer today to see if it will help his left arm symptoms.  CT brain w/out contrast 01/28/17: IMPRESSION: Subdural hematoma on the right with what appears to be subacute hemorrhage. There is moderate mass effect on the right frontal, temporal, and parietal lobes with a maximum thickness of this subdural hematoma of 12 mm. No midline shift. No other extra-axial fluid. No acute intra-axial hemorrhage evident.  Old  prior infarcts as noted above. There is patchy periventricular small vessel disease. Mild underlying atrophy. No acute infarct is demonstrable on this study. Areas of ethmoid sinus disease noted     Past Medical History:  Diagnosis Date  . ADD (attention deficit disorder)    "yeah I had this once" ??  . Anxiety and depression   . Arthritis   . Chronic joint pain   . COPD (chronic obstructive pulmonary disease) ([place])   . Depression   . Frequent headaches   . History of kidney stones   . Hypertension   . Pneumothorax, right   . Polysubstance abuse   . Stroke ([place]) 2012   LUE residual deficit: flexion contracture and weakness of L hand.  Old bilateral cerebral infarcts involving the right frontal and parietal lobes as well as the left occipital lobe  . Venous insufficiency of both lower extremities     Past Surgical History:  Procedure Laterality Date  . CHEST TUBE INSERTION    . LUNG SURGERY    . SHOULDER SURGERY     Multiple rotator cuff surgeries on both sides.  Marland Kitchen VIDEO ASSISTED THORACOSCOPY (VATS) W/TALC PLEUADESIS  2010    Outpatient Medications Prior to Visit  Medication Sig Dispense Refill  . hydrochlorothiazide (HYDRODIURIL) 25 MG tablet Take 1 tablet (25 mg total) by mouth daily. 30 tablet 0  .  irbesartan (AVAPRO) 300 MG tablet Take 1 tablet (300 mg total) by mouth daily. 90 tablet 1  . varenicline (CHANTIX CONTINUING MONTH PAK) 1 MG tablet Take 1 tablet (1 mg total) by mouth 2 (two) times daily. 60 tablet 1  . zolpidem (AMBIEN CR) 12.5 MG CR tablet Take 1 tablet (12.5 mg total) by mouth at bedtime as needed for sleep. 30 tablet 5  . DULoxetine (CYMBALTA) 30 MG capsule Take 1 capsule (30 mg total) by mouth daily. 30 capsule 0   No facility-administered medications prior to visit.     No Known Allergies  ROS As per HPI  PE: Blood pressure 124/68, pulse 76, temperature 97.8 F (36.6 C), temperature source Oral, resp. rate 16, height 5' 6.5" (1.689 m), weight  181 lb 12 oz (82.4 kg), SpO2 97 %. 124/68 was his repeat bp with manual cuff. Gen: Alert, well appearing.  Patient is oriented to person, place, time, and situation. AFFECT: pleasant, lucid thought and speech. TUU:EKCM: no injection, icteris, swelling, or exudate.  EOMI, PERRLA. Mouth: lips without lesion/swelling.  Oral mucosa pink and moist. Oropharynx without erythema, exudate, or swelling.  CV: RRR, no m/r/g.   LUNGS: CTA bilat, nonlabored resps, good aeration in all lung fields. EXT: no clubbing, cyanosis, or edema.  Neuro: CN 2-12 intact bilaterally, strength 5/5 in proximal and distal R upper extremity and lower extremity.  L UE and L LE with 4+/5 strength, with stiff ROM of L elbow joint, L wrist joint (with decreased ROM) and all fingers of L hand have stiff extension with some deformity, markedly limited ROM in L fingers.    No tremor.  No ataxia.  No pronator drift.   LABS:    Chemistry      Component Value Date/Time   NA 134 (L) 01/28/2017 2142   K 3.8 01/28/2017 2142   CL 99 (L) 01/28/2017 2142   CO2 27 01/28/2017 2142   BUN 30 (H) 01/28/2017 2142   CREATININE 1.45 (H) 01/28/2017 2142      Component Value Date/Time   CALCIUM 9.2 01/28/2017 2142   ALKPHOS 86 01/28/2017 2142   AST 19 01/28/2017 2142   ALT 11 (L) 01/28/2017 2142   BILITOT 0.8 01/28/2017 2142       IMPRESSION AND PLAN:  1) Right subdural hematoma: get [facility] records. He does not appear to have any new neurologic deficits from this.  2) HTN: well controlled now.  Continue hctz 25mg  qd and irbesartan 300 mg qd. No ASA for now.  Check BMET today.  3) GAD: increase duloxetine to 60mg  qd.  4) Remote hx of CVA (2012), with residual uncomfortable L UE stiffness/contractures.  I declined to prescribe pt any muscle relaxer today given his hx of polysubstance abuse.  However, I ordered referral to physical med and rehab to see if he is a candidate for any therapy for this condition (? botox  injections?).  An After Visit Summary was printed and given to the patient.  FOLLOW UP: Return in about 4 weeks (around 03/12/2017) for routine chronic illness f/u (30 min).  Signed:  Crissie Sickles, MD           02/12/2017

## 2017-02-13 ENCOUNTER — Other Ambulatory Visit: Payer: Self-pay | Admitting: Family Medicine

## 2017-02-13 DIAGNOSIS — I10 Essential (primary) hypertension: Secondary | ICD-10-CM

## 2017-02-13 DIAGNOSIS — R7989 Other specified abnormal findings of blood chemistry: Secondary | ICD-10-CM

## 2017-02-13 DIAGNOSIS — E871 Hypo-osmolality and hyponatremia: Secondary | ICD-10-CM

## 2017-02-13 LAB — BASIC METABOLIC PANEL
BUN: 47 mg/dL — ABNORMAL HIGH (ref 6–23)
CALCIUM: 9.9 mg/dL (ref 8.4–10.5)
CHLORIDE: 100 meq/L (ref 96–112)
CO2: 22 meq/L (ref 19–32)
CREATININE: 1.95 mg/dL — AB (ref 0.40–1.50)
GFR: 37.76 mL/min — ABNORMAL LOW (ref >60.00)
GLUCOSE: 108 mg/dL — AB (ref 70–99)
Potassium: 3.8 mEq/L (ref 3.5–5.1)
Sodium: 130 mEq/L — ABNORMAL LOW (ref 135–145)

## 2017-02-13 MED ORDER — METOPROLOL SUCCINATE ER 50 MG PO TB24: 50.0000 mg | ORAL_TABLET | Freq: Every day | ORAL | 1 refills | Status: DC

## 2017-02-13 MED ORDER — IRBESARTAN 300 MG PO TABS: ORAL_TABLET | ORAL | 1 refills | Status: DC

## 2017-02-13 NOTE — Telephone Encounter (Signed)
[facility].  RF request for hctz LOV: 02/12/17 Next ov: None Last written: 01/17/17 #30 w/ 5XM  Duloxetine was sent in on 02/12/17 by Dr. Anitra Lauth, dose was changed.

## 2017-02-14 ENCOUNTER — Encounter: Payer: Self-pay | Admitting: Family Medicine

## 2017-02-14 ENCOUNTER — Encounter: Payer: Self-pay | Admitting: *Deleted

## 2017-02-19 ENCOUNTER — Telehealth: Payer: Self-pay | Admitting: *Deleted

## 2017-02-19 NOTE — Telephone Encounter (Signed)
Please advise. Thanks.  

## 2017-02-19 NOTE — Telephone Encounter (Signed)
-----   Message from Nickola Major sent at 02/19/2017  4:56 PM EDT ----- Hi, here is a note I received from [facility] PT: We are [facility], are you wanting this patient to have Physical Therapy, Occupational, or Speech Therapies? Are you wanting the patient to come here? Thank you Diane

## 2017-02-19 NOTE — Telephone Encounter (Signed)
I do want him to go to [facility] neuro-rehab.-thx

## 2017-02-27 ENCOUNTER — Encounter: Payer: Self-pay | Admitting: *Deleted

## 2017-03-25 ENCOUNTER — Other Ambulatory Visit: Payer: Self-pay | Admitting: Family Medicine

## 2017-03-25 NOTE — Telephone Encounter (Signed)
[facility].  RF request for fluoxetine LOV: 02/12/17 Next ov: None Last written: 12/20/16 #30 w/ 0RF  Rx was d/c.

## 2017-04-03 ENCOUNTER — Other Ambulatory Visit: Payer: Self-pay | Admitting: Family Medicine

## 2017-04-04 NOTE — Telephone Encounter (Signed)
[facility]  RF request for duloxetine LOV: 02/12/17 Next ov: None Last written: 02/12/17 #30 w/ 1RF

## 2017-04-08 ENCOUNTER — Other Ambulatory Visit: Payer: Self-pay | Admitting: Family Medicine

## 2017-04-18 ENCOUNTER — Encounter: Payer: Self-pay | Admitting: Family Medicine

## 2017-04-18 ENCOUNTER — Ambulatory Visit: Payer: Medicare Other | Admitting: Family Medicine

## 2017-05-05 ENCOUNTER — Telehealth: Payer: Self-pay

## 2017-05-05 NOTE — Telephone Encounter (Signed)
LM requesting call back regarding AWV. Requesting to complete AWV with Health Coach on 05/09/2017 @ 1pm prior to appointment with PCP.

## 2017-05-08 ENCOUNTER — Encounter: Payer: Self-pay | Admitting: *Deleted

## 2017-05-08 DIAGNOSIS — F32A Depression, unspecified: Secondary | ICD-10-CM | POA: Insufficient documentation

## 2017-05-08 DIAGNOSIS — F329 Major depressive disorder, single episode, unspecified: Secondary | ICD-10-CM | POA: Insufficient documentation

## 2017-05-09 ENCOUNTER — Encounter: Payer: Self-pay | Admitting: Family Medicine

## 2017-05-09 ENCOUNTER — Ambulatory Visit (INDEPENDENT_AMBULATORY_CARE_PROVIDER_SITE_OTHER): Payer: Medicare Other | Admitting: Family Medicine

## 2017-05-09 VITALS — BP 114/74 | HR 66 | Temp 98.4°F | Resp 20 | Ht 67.0 in | Wt 172.1 lb

## 2017-05-09 DIAGNOSIS — Z0000 Encounter for general adult medical examination without abnormal findings: Secondary | ICD-10-CM | POA: Diagnosis not present

## 2017-05-09 DIAGNOSIS — F17200 Nicotine dependence, unspecified, uncomplicated: Secondary | ICD-10-CM

## 2017-05-09 DIAGNOSIS — F5101 Primary insomnia: Secondary | ICD-10-CM

## 2017-05-09 DIAGNOSIS — I10 Essential (primary) hypertension: Secondary | ICD-10-CM | POA: Diagnosis not present

## 2017-05-09 DIAGNOSIS — R7989 Other specified abnormal findings of blood chemistry: Secondary | ICD-10-CM

## 2017-05-09 DIAGNOSIS — E871 Hypo-osmolality and hyponatremia: Secondary | ICD-10-CM | POA: Diagnosis not present

## 2017-05-09 DIAGNOSIS — Z1159 Encounter for screening for other viral diseases: Secondary | ICD-10-CM | POA: Diagnosis not present

## 2017-05-09 DIAGNOSIS — F330 Major depressive disorder, recurrent, mild: Secondary | ICD-10-CM | POA: Diagnosis not present

## 2017-05-09 DIAGNOSIS — F411 Generalized anxiety disorder: Secondary | ICD-10-CM

## 2017-05-09 DIAGNOSIS — Z114 Encounter for screening for human immunodeficiency virus [HIV]: Secondary | ICD-10-CM

## 2017-05-09 LAB — BASIC METABOLIC PANEL
BUN: 10 mg/dL (ref 6–23)
CALCIUM: 9.1 mg/dL (ref 8.4–10.5)
CO2: 25 meq/L (ref 19–32)
Chloride: 105 mEq/L (ref 96–112)
Creatinine, Ser: 0.96 mg/dL (ref 0.40–1.50)
GFR: 85.47 mL/min (ref >60.00)
GLUCOSE: 108 mg/dL — AB (ref 70–99)
Potassium: 3.4 mEq/L — ABNORMAL LOW (ref 3.5–5.1)
Sodium: 140 mEq/L (ref 135–145)

## 2017-05-09 MED ORDER — QUETIAPINE FUMARATE 400 MG PO TABS: 400.0000 mg | ORAL_TABLET | Freq: Every day | ORAL | 2 refills | Status: DC

## 2017-05-09 NOTE — Progress Notes (Signed)
OFFICE VISIT  05/09/2017   CC:  Chief Complaint  Patient presents with  . Medicare Wellness  F/u HTN and insomnia.  HPI:    Patient is a 56 y.o. Caucasian male who presents for f/u HTN and insomnia. I last saw him 3 months ago.   At that visit his labs showed low Na and depressed kidney function. I advised pt to stop hctz and cut his irbesartan in half and take it once daily.  Start new bp med called metoprolol.  Return for lab visit in 7-10 days to repeat BMET.  We were never able to get in contact with him on phone so we mailed him his results and my recommendations but we never heard back from him. However, it appears that he did get the message and made the med changes ---for the last 2 mo.  Says he is feeling well now.  Avg bp 140/80s. Asks for quetiapine 400mg  qhs for sleep, says he has been taking his son's quetiapine and this helps him sleep. Says duloxetine "is like taking an aspirin" and his chronic anxiety is still very bothersome and also feels depressed every day. He says he has quit drinking alcohol. Tobacco: smokes 1 PPD.  Chantix made him feel too dizzy.  He asks for nicotine patches today.  Past Medical History:  Diagnosis Date  . ADD (attention deficit disorder)    "yeah I had this once" ??  . Alcoholism ([place])    Continues to abuse ETOH as of 01/2017; pt has poor insight into his problem.  Marland Kitchen Anxiety and depression   . Arthritis   . Chronic joint pain   . COPD (chronic obstructive pulmonary disease) ([place])   . Depression   . Frequent headaches   . History of kidney stones   . Hypertension   . Insomnia   . Pneumothorax, right   . Polysubstance abuse   . Stroke ([place]) 2012   LUE residual deficit: flexion contracture and weakness of L hand.  Old bilateral cerebral infarcts involving the right frontal and parietal lobes as well as the left occipital lobe  . Subdural hematoma ([place]) 01/29/2017   Found on CT in [place] ED, transferred to [facility] where this was  non-operatively managed.  Pt admitted to the MDs at [facility] that he still drinks lots of whisky daily and they surmised that his frequent falls of late are due to this and frequent use of OTC sleep aids.  . Venous insufficiency of both lower extremities     Past Surgical History:  Procedure Laterality Date  . CHEST TUBE INSERTION    . LUNG SURGERY    . SHOULDER SURGERY     Multiple rotator cuff surgeries on both sides.  Marland Kitchen VIDEO ASSISTED THORACOSCOPY (VATS) W/TALC PLEUADESIS  2010    Outpatient Medications Prior to Visit  Medication Sig Dispense Refill  . irbesartan (AVAPRO) 300 MG tablet 1/2 tab po qd 90 tablet 1  . metoprolol succinate (TOPROL-XL) 50 MG 24 hr tablet TAKE 1 TABLET (50 MG TOTAL) BY MOUTH DAILY. TAKE WITH OR IMMEDIATELY FOLLOWING A MEAL. 30 tablet 1  . zolpidem (AMBIEN CR) 12.5 MG CR tablet Take 1 tablet (12.5 mg total) by mouth at bedtime as needed for sleep. 30 tablet 5  . DULoxetine (CYMBALTA) 60 MG capsule TAKE ONE CAPSULE BY MOUTH EVERY DAY 30 capsule 1  . varenicline (CHANTIX CONTINUING MONTH PAK) 1 MG tablet Take 1 tablet (1 mg total) by mouth 2 (two) times daily. (Patient not  taking: Reported on 05/09/2017) 60 tablet 1   No facility-administered medications prior to visit.     No Known Allergies  ROS As per HPI  PE: Blood pressure 114/74, pulse 66, temperature 98.4 F (36.9 C), temperature source Oral, resp. rate 20, height 5\' 7"  (1.702 m), weight 172 lb 1.9 oz (78.1 kg), SpO2 98 %. Gen: Alert, well appearing.  Patient is oriented to person, place, time, and situation. AFFECT: pleasant, lucid thought and speech.  DTO:IZTI: no injection, icteris, swelling, or exudate.  EOMI, PERRLA. Mouth: lips without lesion/swelling.  Oral mucosa pink and moist. Oropharynx without erythema, exudate, or swelling.  CV: RRR, no m/r/g.   LUNGS: CTA bilat, nonlabored resps, good aeration in all lung fields.   LABS:    Chemistry      Component Value Date/Time   NA 130 (L)  02/12/2017 1521   NA 134 (A) 01/29/2017   K 3.8 02/12/2017 1521   CL 100 02/12/2017 1521   CO2 22 02/12/2017 1521   BUN 47 (H) 02/12/2017 1521   BUN 28 (A) 01/29/2017   CREATININE 1.95 (H) 02/12/2017 1521      Component Value Date/Time   CALCIUM 9.9 02/12/2017 1521   ALKPHOS 86 01/28/2017 2142   AST 19 01/28/2017 2142   ALT 11 (L) 01/28/2017 2142   BILITOT 0.8 01/28/2017 2142     IMPRESSION AND PLAN:  1) HTN, relatively well controlled.  Monitoring bp with wrist cuff--? Reliability/consistency.   Continue current meds, check lytes/cr. Continue home bp monitoring.  2) hyponatremia and elevated creatinine: suspect due to his ARB dose and taking hctz with it. Changed irbesartan to 1/2 of 300 mg tab and d/c'd hctz.  Started toprol xl 50mg  qd.  3) Insomnia: much improved with taking his son's quetiapine 400 mg qhs. He has failed MANY other insomnia meds. I rx'd 400 mg quetiapine qhs today, #30, RF x 2. Therapeutic expectations and side effect profile of medication discussed today.  Patient's questions answered.  4) Tobacco dependence: adverse effects from chantix.  Rx'd nicotine patches for pt today to see if his insurer covers these. He has done fairly well over the last year with cutting back from 2 ppd to 1 ppd.  5) Hx of right subdural hematoma: no neurologic deficits from this.  He has no f/u planned with [facility] neurosurgery. Will research to see if any repeat imaging is indicated since he is asymptomatic.  6) GAD and mild recurrent episode of MDD: no improvement with duloxetine. He can stop this med (i suspect he already has).  No new med at this time. We'll address this again at next f/u in 3 mo.  An After Visit Summary was printed and given to the patient.  FOLLOW UP: Return in about 3 months (around 08/09/2017) for routine chronic illness f/u.  Signed:  Crissie Sickles, MD           05/09/2017

## 2017-05-09 NOTE — Progress Notes (Signed)
Subjective:   [name] is a 56 y.o. male who presents for an Initial Medicare Annual Wellness Visit.  Review of Systems  No ROS.  Medicare Wellness Visit. Additional risk factors are reflected in the social history.  Cardiac Risk Factors include: advanced age (>31men, >55 women);hypertension;male gender;smoking/ tobacco exposure;family history of premature cardiovascular disease   Sleep patterns: Sleeps 3-4 hours, not rested. Up to void "several times" Home Safety/Smoke Alarms: Feels safe in home. Smoke alarms in place.  Living environment; residence and Firearm Safety: Lives with adult son in 1 story home.  Seat Belt Safety/Bike Helmet: Wears seat belt.   Counseling:   Eye Exam-Last exam > 2 years.  Dental-Full dentures, gum care discussed.   Male:   CCS-Colonoscopy 2008 in [place], normal. (pt reports). Declines testing.  PSA- No results found for: PSA     Objective:    Today's Vitals   05/09/17 1317  BP: 114/74  Pulse: 66  Resp: 20  Temp: 98.4 F (36.9 C)  TempSrc: Oral  SpO2: 98%  Weight: 172 lb 1.9 oz (78.1 kg)  Height: 5\' 7"  (1.702 m)  PainSc: 8    Body mass index is 26.96 kg/m.  Current Medications (verified) Outpatient Encounter Prescriptions as of 05/09/2017  Medication Sig  . DULoxetine (CYMBALTA) 60 MG capsule TAKE ONE CAPSULE BY MOUTH EVERY DAY  . irbesartan (AVAPRO) 300 MG tablet 1/2 tab po qd  . metoprolol succinate (TOPROL-XL) 50 MG 24 hr tablet TAKE 1 TABLET (50 MG TOTAL) BY MOUTH DAILY. TAKE WITH OR IMMEDIATELY FOLLOWING A MEAL.  Marland Kitchen zolpidem (AMBIEN CR) 12.5 MG CR tablet Take 1 tablet (12.5 mg total) by mouth at bedtime as needed for sleep.  . varenicline (CHANTIX CONTINUING MONTH PAK) 1 MG tablet Take 1 tablet (1 mg total) by mouth 2 (two) times daily. (Patient not taking: Reported on 05/09/2017)   No facility-administered encounter medications on file as of 05/09/2017.     Allergies (verified) Patient has no known allergies.    History: Past Medical History:  Diagnosis Date  . ADD (attention deficit disorder)    "yeah I had this once" ??  . Alcoholism ([place])    Continues to abuse ETOH as of 01/2017; pt has poor insight into his problem.  Marland Kitchen Anxiety and depression   . Arthritis   . Chronic joint pain   . COPD (chronic obstructive pulmonary disease) ([place])   . Depression   . Frequent headaches   . History of kidney stones   . Hypertension   . Insomnia   . Pneumothorax, right   . Polysubstance abuse   . Stroke ([place]) 2012   LUE residual deficit: flexion contracture and weakness of L hand.  Old bilateral cerebral infarcts involving the right frontal and parietal lobes as well as the left occipital lobe  . Subdural hematoma ([place]) 01/29/2017   Found on CT in [place] ED, transferred to [facility] where this was non-operatively managed.  Pt admitted to the MDs at [facility] that he still drinks lots of whisky daily and they surmised that his frequent falls of late are due to this and frequent use of OTC sleep aids.  . Venous insufficiency of both lower extremities    Past Surgical History:  Procedure Laterality Date  . CHEST TUBE INSERTION    . LUNG SURGERY    . SHOULDER SURGERY     Multiple rotator cuff surgeries on both sides.  Marland Kitchen VIDEO ASSISTED THORACOSCOPY (VATS) W/TALC PLEUADESIS  2010  Family History  Problem Relation Age of Onset  . Arthritis Mother   . Heart disease Father   . Hypertension Father   . Breast cancer Maternal Aunt   . Alcohol abuse Maternal Uncle   . Lung cancer Maternal Aunt   . Cancer Brother        bone   Social History   Occupational History  . Not on file.   Social History Main Topics  . Smoking status: Current Every Day Smoker    Packs/day: 1.00    Years: 43.00    Types: Cigarettes  . Smokeless tobacco: Never Used  . Alcohol use No  . Drug use: Yes    Frequency: 2.0 times per week    Types: Marijuana     Comment: 2 joints/week  . Sexual activity: Not on file   Tobacco  Counseling Ready to quit: Not Answered Counseling given: Yes   Activities of Daily Living In your present state of health, do you have any difficulty performing the following activities: 05/09/2017  Hearing? N  Vision? N  Difficulty concentrating or making decisions? N  Walking or climbing stairs? N  Dressing or bathing? N  Doing errands, shopping? N  Preparing Food and eating ? N  Using the Toilet? N  In the past six months, have you accidently leaked urine? N  Do you have problems with loss of bowel control? N  Managing your Medications? N  Managing your Finances? N  Housekeeping or managing your Housekeeping? N  Some recent data might be hidden    Immunizations and Health Maintenance Immunization History  Administered Date(s) Administered  . Influenza,inj,Quad PF,36+ Mos 11/21/2016   Health Maintenance Due  Topic Date Due  . Hepatitis C Screening  1961-05-09  . HIV Screening  03/28/1974    Patient Care Team: Tammi Sou, MD as PCP - General (Family Medicine)  Indicate any recent Medical Services you may have received from other than [facility] providers in the past year (date may be approximate).    Assessment:   This is a routine wellness examination for [name]. Physical assessment deferred to PCP.   Hearing/Vision screen Hearing Screening Comments: Able to hear conversational tones w/o difficulty. No issues reported.   Vision Screening Comments: Wears reading glasses.   Dietary issues and exercise activities discussed: Current Exercise Habits: The patient does not participate in regular exercise at present [employer] work), Exercise limited by: None identified   Diet (meal preparation, eat out, water intake, caffeinated beverages, dairy products, fruits and vegetables): water and mt dew  Only eats one meal/day around 5 pm--"meat and potatoes"  Goals    . Quit smoking / using tobacco          Quit smoking       Depression Screen PHQ 2/9 Scores 05/09/2017   PHQ - 2 Score 6  PHQ- 9 Score 20    Fall Risk Fall Risk  05/09/2017  Falls in the past year? Yes  Number falls in past yr: 2 or more  Injury with Fall? No  Risk for fall due to : Medication side effect  Follow up Falls prevention discussed    Cognitive Function: MMSE - Mini Mental State Exam 05/09/2017  Orientation to time 5  Orientation to Place 5  Registration 3  Attention/ Calculation 5  Recall 2  Language- name 2 objects 2  Language- repeat 1  Language- follow 3 step command 3  Language- read & follow direction 1  Write a sentence 1  Copy design 1  Total score 29        Screening Tests Health Maintenance  Topic Date Due  . Hepatitis C Screening  Aug 10, 1961  . HIV Screening  03/28/1974  . COLONOSCOPY  05/09/2018 (Originally 03/28/2009)  . TETANUS/TDAP  05/09/2018 (Originally 03/28/1978)  . INFLUENZA VACCINE  05/28/2017   Declines colonoscopy HIV and Hep C screening ordered for future labs.       Plan:    Bring a copy of your advance directives to your next office visit.  Start doing brain stimulating activities (puzzles, reading, adult coloring books, staying active) to keep memory sharp.    I have personally reviewed and noted the following in the patient's chart:   . Medical and social history . Use of alcohol, tobacco or illicit drugs  . Current medications and supplements . Functional ability and status . Nutritional status . Physical activity . Advanced directives . List of other physicians . Hospitalizations, surgeries, and ER visits in previous 12 months . Vitals . Screenings to include cognitive, depression, and falls . Referrals and appointments  In addition, I have reviewed and discussed with patient certain preventive protocols, quality metrics, and best practice recommendations. A written personalized care plan for preventive services as well as general preventive health recommendations were provided to patient.     Gerilyn Nestle,  RN   05/09/2017

## 2017-05-09 NOTE — Patient Instructions (Addendum)
Bring a copy of your advance directives to your next office visit.  Start doing brain stimulating activities (puzzles, reading, adult coloring books, staying active) to keep memory sharp.    Health Maintenance, Male A healthy lifestyle and preventive care is important for your health and wellness. Ask your health care provider about what schedule of regular examinations is right for you. What should I know about weight and diet? Eat a Healthy Diet  Eat plenty of vegetables, fruits, whole grains, low-fat dairy products, and lean protein.  Do not eat a lot of foods high in solid fats, added sugars, or salt.  Maintain a Healthy Weight Regular exercise can help you achieve or maintain a healthy weight. You should:  Do at least 150 minutes of exercise each week. The exercise should increase your heart rate and make you sweat (moderate-intensity exercise).  Do strength-training exercises at least twice a week.  Watch Your Levels of Cholesterol and Blood Lipids  Have your blood tested for lipids and cholesterol every 5 years starting at 58 years of age. If you are at high risk for heart disease, you should start having your blood tested when you are 58 years old. You may need to have your cholesterol levels checked more often if: ? Your lipid or cholesterol levels are high. ? You are older than 58 years of age. ? You are at high risk for heart disease.  What should I know about cancer screening? Many types of cancers can be detected early and may often be prevented. Lung Cancer  You should be screened every year for lung cancer if: ? You are a current smoker who has smoked for at least 30 years. ? You are a former smoker who has quit within the past 15 years.  Talk to your health care provider about your screening options, when you should start screening, and how often you should be screened.  Colorectal Cancer  Routine colorectal cancer screening usually begins at 58 years of age  and should be repeated every 5-10 years until you are 58 years old. You may need to be screened more often if early forms of precancerous polyps or small growths are found. Your health care provider may recommend screening at an earlier age if you have risk factors for colon cancer.  Your health care provider may recommend using home test kits to check for hidden blood in the stool.  A small camera at the end of a tube can be used to examine your colon (sigmoidoscopy or colonoscopy). This checks for the earliest forms of colorectal cancer.  Prostate and Testicular Cancer  Depending on your age and overall health, your health care provider may do certain tests to screen for prostate and testicular cancer.  Talk to your health care provider about any symptoms or concerns you have about testicular or prostate cancer.  Skin Cancer  Check your skin from head to toe regularly.  Tell your health care provider about any new moles or changes in moles, especially if: ? There is a change in a mole's size, shape, or color. ? You have a mole that is larger than a pencil eraser.  Always use sunscreen. Apply sunscreen liberally and repeat throughout the day.  Protect yourself by wearing long sleeves, pants, a wide-brimmed hat, and sunglasses when outside.  What should I know about heart disease, diabetes, and high blood pressure?  If you are 26-54 years of age, have your blood pressure checked every 3-5 years. If you are  93 years of age or older, have your blood pressure checked every year. You should have your blood pressure measured twice-once when you are at a hospital or clinic, and once when you are not at a hospital or clinic. Record the average of the two measurements. To check your blood pressure when you are not at a hospital or clinic, you can use: ? An automated blood pressure machine at a pharmacy. ? A home blood pressure monitor.  Talk to your health care provider about your target blood  pressure.  If you are between 30-13 years old, ask your health care provider if you should take aspirin to prevent heart disease.  Have regular diabetes screenings by checking your fasting blood sugar level. ? If you are at a normal weight and have a low risk for diabetes, have this test once every three years after the age of 28. ? If you are overweight and have a high risk for diabetes, consider being tested at a younger age or more often.  A one-time screening for abdominal aortic aneurysm (AAA) by ultrasound is recommended for men aged 68-75 years who are current or former smokers. What should I know about preventing infection? Hepatitis B If you have a higher risk for hepatitis B, you should be screened for this virus. Talk with your health care provider to find out if you are at risk for hepatitis B infection. Hepatitis C Blood testing is recommended for:  Everyone born from [birth year] through 1967.  Anyone with known risk factors for hepatitis C.  Sexually Transmitted Diseases (STDs)  You should be screened each year for STDs including gonorrhea and chlamydia if: ? You are sexually active and are younger than 58 years of age. ? You are older than 58 years of age and your health care provider tells you that you are at risk for this type of infection. ? Your sexual activity has changed since you were last screened and you are at an increased risk for chlamydia or gonorrhea. Ask your health care provider if you are at risk.  Talk with your health care provider about whether you are at high risk of being infected with HIV. Your health care provider may recommend a prescription medicine to help prevent HIV infection.  What else can I do?  Schedule regular health, dental, and eye exams.  Stay current with your vaccines (immunizations).  Do not use any tobacco products, such as cigarettes, chewing tobacco, and e-cigarettes. If you need help quitting, ask your health care  provider.  Limit alcohol intake to no more than 2 drinks per day. One drink equals 12 ounces of beer, 5 ounces of wine, or 1 ounces of hard liquor.  Do not use street drugs.  Do not share needles.  Ask your health care provider for help if you need support or information about quitting drugs.  Tell your health care provider if you often feel depressed.  Tell your health care provider if you have ever been abused or do not feel safe at home. This information is not intended to replace advice given to you by your health care provider. Make sure you discuss any questions you have with your health care provider. Document Released: 04/11/2008 Document Revised: 06/12/2016 Document Reviewed: 07/18/2015 Elsevier Interactive Patient Education  2018 [employer].   Steps to Quit Smoking Smoking tobacco can be bad for your health. It can also affect almost every organ in your body. Smoking puts you and people around you at risk  for many serious long-lasting (chronic) diseases. Quitting smoking is hard, but it is one of the best things that you can do for your health. It is never too late to quit. What are the benefits of quitting smoking? When you quit smoking, you lower your risk for getting serious diseases and conditions. They can include:  Lung cancer or lung disease.  Heart disease.  Stroke.  Heart attack.  Not being able to have children (infertility).  Weak bones (osteoporosis) and broken bones (fractures).  If you have coughing, wheezing, and shortness of breath, those symptoms may get better when you quit. You may also get sick less often. If you are pregnant, quitting smoking can help to lower your chances of having a baby of low birth weight. What can I do to help me quit smoking? Talk with your doctor about what can help you quit smoking. Some things you can do (strategies) include:  Quitting smoking totally, instead of slowly cutting back how much you smoke over a period of  time.  Going to in-person counseling. You are more likely to quit if you go to many counseling sessions.  Using resources and support systems, such as: ? Database administrator with a Social worker. ? Phone quitlines. ? Careers information officer. ? Support groups or group counseling. ? Text messaging programs. ? Mobile phone apps or applications.  Taking medicines. Some of these medicines may have nicotine in them. If you are pregnant or breastfeeding, do not take any medicines to quit smoking unless your doctor says it is okay. Talk with your doctor about counseling or other things that can help you.  Talk with your doctor about using more than one strategy at the same time, such as taking medicines while you are also going to in-person counseling. This can help make quitting easier. What things can I do to make it easier to quit? Quitting smoking might feel very hard at first, but there is a lot that you can do to make it easier. Take these steps:  Talk to your family and friends. Ask them to support and encourage you.  Call phone quitlines, reach out to support groups, or work with a Social worker.  Ask people who smoke to not smoke around you.  Avoid places that make you want (trigger) to smoke, such as: ? Bars. ? Parties. ? Smoke-break areas at work.  Spend time with people who do not smoke.  Lower the stress in your life. Stress can make you want to smoke. Try these things to help your stress: ? Getting regular exercise. ? Deep-breathing exercises. ? Yoga. ? Meditating. ? Doing a body scan. To do this, close your eyes, focus on one area of your body at a time from head to toe, and notice which parts of your body are tense. Try to relax the muscles in those areas.  Download or buy apps on your mobile phone or tablet that can help you stick to your quit plan. There are many free apps, such as QuitGuide from the [employer] Office manager for Disease Control and Prevention). You can find more support from  smokefree.gov and other websites.  This information is not intended to replace advice given to you by your health care provider. Make sure you discuss any questions you have with your health care provider. Document Released: 08/10/2009 Document Revised: 06/11/2016 Document Reviewed: 02/28/2015 Elsevier Interactive Patient Education  2018 [employer].

## 2017-05-10 LAB — HIV ANTIBODY (ROUTINE TESTING W REFLEX): HIV 1&2 Ab, 4th Generation: NONREACTIVE

## 2017-05-10 LAB — HEPATITIS C ANTIBODY: HCV Ab: NEGATIVE

## 2017-05-14 NOTE — Progress Notes (Signed)
AWV reviewed and agree.  Signed:  Phil McGowen, MD           05/14/2017  

## 2017-05-25 ENCOUNTER — Other Ambulatory Visit: Payer: Self-pay | Admitting: Family Medicine

## 2017-05-27 ENCOUNTER — Telehealth: Payer: Self-pay | Admitting: Family Medicine

## 2017-05-27 NOTE — Telephone Encounter (Signed)
Patient called into office to see status of neurologist referral.  I didn't see anything in the chart regarding this.   Patient also mentioned needing referral for hand specialist?  Please advise.

## 2017-05-28 ENCOUNTER — Other Ambulatory Visit: Payer: Self-pay | Admitting: Family Medicine

## 2017-05-28 DIAGNOSIS — M62442 Contracture of muscle, left hand: Secondary | ICD-10-CM

## 2017-05-28 DIAGNOSIS — I6930 Unspecified sequelae of cerebral infarction: Secondary | ICD-10-CM

## 2017-05-28 DIAGNOSIS — Z8679 Personal history of other diseases of the circulatory system: Secondary | ICD-10-CM

## 2017-05-28 NOTE — Telephone Encounter (Signed)
I'll refer him to neurologist and they will determine the best course of action to address any physical therapy needs for his arm and hand.

## 2017-05-28 NOTE — Telephone Encounter (Signed)
In April a referral to PT was put in for pts hand? Looks like they tried to contact him by phone three times and pt did not return call. I did not see anything for a neurologist referral. Please advise. Thanks.

## 2017-05-28 NOTE — Telephone Encounter (Signed)
Left detailed message on cell vm, okay per DPR.  

## 2017-05-28 NOTE — Progress Notes (Signed)
Encounter opened for order only.

## 2017-06-06 ENCOUNTER — Encounter: Payer: Self-pay | Admitting: Neurology

## 2017-06-13 ENCOUNTER — Encounter: Payer: Self-pay | Admitting: Family Medicine

## 2017-06-13 ENCOUNTER — Other Ambulatory Visit: Payer: Self-pay | Admitting: Family Medicine

## 2017-06-13 MED ORDER — TRAZODONE HCL 50 MG PO TABS: ORAL_TABLET | ORAL | 0 refills | Status: DC

## 2017-06-13 NOTE — Telephone Encounter (Signed)
Pt was also requesting Rx for Trazodone. Not on medication list. Please advise. Thanks.

## 2017-06-13 NOTE — Telephone Encounter (Signed)
Pt is requesting 90 day supply for the duloxetine and trazodone. He stated that he is going to [place] in a month and will not be able to fill his medications there. Please advise. Thanks.

## 2017-06-27 ENCOUNTER — Other Ambulatory Visit: Payer: Self-pay

## 2017-06-27 MED ORDER — QUETIAPINE FUMARATE 400 MG PO TABS: 400.0000 mg | ORAL_TABLET | Freq: Every day | ORAL | 2 refills | Status: DC

## 2017-07-01 MED ORDER — QUETIAPINE FUMARATE 400 MG PO TABS: 400.0000 mg | ORAL_TABLET | Freq: Every day | ORAL | 0 refills | Status: DC

## 2017-07-01 NOTE — Telephone Encounter (Signed)
Received fax from [facility] requesting Rx for 90 day supply. Please advise. Thanks.

## 2017-07-01 NOTE — Addendum Note (Signed)
Addended by: Onalee Hua on: 07/01/2017 09:34 AM   Modules accepted: Orders

## 2017-07-14 ENCOUNTER — Other Ambulatory Visit: Payer: Self-pay | Admitting: *Deleted

## 2017-07-14 MED ORDER — METOPROLOL SUCCINATE ER 50 MG PO TB24: 50.0000 mg | ORAL_TABLET | Freq: Every day | ORAL | 1 refills | Status: DC

## 2017-07-14 NOTE — Telephone Encounter (Signed)
Refill request from [facility] requesting 90 day supply for metoprolol. Rx sent.

## 2017-07-25 ENCOUNTER — Telehealth: Payer: Self-pay | Admitting: Family Medicine

## 2017-07-25 ENCOUNTER — Other Ambulatory Visit: Payer: Self-pay | Admitting: Family Medicine

## 2017-07-25 MED ORDER — ZOLPIDEM TARTRATE ER 12.5 MG PO TBCR: 12.5000 mg | EXTENDED_RELEASE_TABLET | Freq: Every evening | ORAL | 0 refills | Status: DC | PRN

## 2017-07-25 NOTE — Telephone Encounter (Signed)
Please advise. Thanks.  

## 2017-07-25 NOTE — Telephone Encounter (Signed)
Left message for pt to call back  °

## 2017-07-25 NOTE — Telephone Encounter (Signed)
Patient called stating that he needs his [place].   I advised patient that per 05/14/17 OV note "Insomnia: much improved with taking his son's quetiapine 400 mg qhs" I advised patient of this and that Lorrin Mais was no longer on his medication list.  He seemed very upset because he "needed" Lorrin Mais and is about to leave country and would like a CB as soon as possible.  Please advise.

## 2017-07-25 NOTE — Telephone Encounter (Signed)
Chart reviewed. I agree, at last follow up visit he reported being very satisfied with use of quetiapine for insomnia, so I rx'd this med for him.  We did this b/c he reported to me that his Lorrin Mais had stopped working for him. I will rx #30 ambien CR.  He must f/u in office to review insomnia and these meds before any FURTHER sleep meds are prescribed.  Make sure he knows to stop quetiapine when he starts taking ambien. -thx

## 2017-07-28 NOTE — Telephone Encounter (Signed)
LM to CB to discuss.

## 2017-07-28 NOTE — Telephone Encounter (Signed)
Rx faxed to [facility].

## 2017-08-08 ENCOUNTER — Ambulatory Visit: Payer: Medicare Other | Admitting: Family Medicine

## 2017-08-15 ENCOUNTER — Ambulatory Visit: Payer: Medicare Other | Admitting: Family Medicine

## 2017-08-20 ENCOUNTER — Other Ambulatory Visit: Payer: Self-pay | Admitting: Family Medicine

## 2017-08-22 ENCOUNTER — Encounter: Payer: Self-pay | Admitting: *Deleted

## 2017-08-22 ENCOUNTER — Ambulatory Visit (INDEPENDENT_AMBULATORY_CARE_PROVIDER_SITE_OTHER): Payer: Medicare Other | Admitting: Family Medicine

## 2017-08-22 ENCOUNTER — Encounter: Payer: Self-pay | Admitting: Family Medicine

## 2017-08-22 VITALS — BP 124/76 | HR 72 | Temp 97.5°F | Resp 16 | Ht 67.0 in | Wt 175.2 lb

## 2017-08-22 DIAGNOSIS — S065X9A Traumatic subdural hemorrhage with loss of consciousness of unspecified duration, initial encounter: Secondary | ICD-10-CM

## 2017-08-22 DIAGNOSIS — F5101 Primary insomnia: Secondary | ICD-10-CM | POA: Diagnosis not present

## 2017-08-22 DIAGNOSIS — L719 Rosacea, unspecified: Secondary | ICD-10-CM | POA: Diagnosis not present

## 2017-08-22 DIAGNOSIS — I10 Essential (primary) hypertension: Secondary | ICD-10-CM | POA: Diagnosis not present

## 2017-08-22 DIAGNOSIS — Z79899 Other long term (current) drug therapy: Secondary | ICD-10-CM

## 2017-08-22 DIAGNOSIS — F17200 Nicotine dependence, unspecified, uncomplicated: Secondary | ICD-10-CM | POA: Diagnosis not present

## 2017-08-22 DIAGNOSIS — S065XAA Traumatic subdural hemorrhage with loss of consciousness status unknown, initial encounter: Secondary | ICD-10-CM

## 2017-08-22 DIAGNOSIS — F1010 Alcohol abuse, uncomplicated: Secondary | ICD-10-CM

## 2017-08-22 LAB — CBC
HEMATOCRIT: 42.2 % (ref 39.0–52.0)
Hemoglobin: 14.4 g/dL (ref 13.0–17.0)
MCHC: 34.1 g/dL (ref 30.0–36.0)
MCV: 95.6 fl (ref 78.0–100.0)
Platelets: 357 10*3/uL (ref 150.0–400.0)
RBC: 4.41 Mil/uL (ref 4.22–5.81)
RDW: 14.2 % (ref 11.5–15.5)
WBC: 8.9 10*3/uL (ref 4.0–10.5)

## 2017-08-22 LAB — COMPREHENSIVE METABOLIC PANEL
ALBUMIN: 4.2 g/dL (ref 3.5–5.2)
ALT: 5 U/L (ref 0–53)
AST: 8 U/L (ref 0–37)
Alkaline Phosphatase: 123 U/L — ABNORMAL HIGH (ref 39–117)
BILIRUBIN TOTAL: 0.3 mg/dL (ref 0.2–1.2)
BUN: 7 mg/dL (ref 6–23)
CALCIUM: 9.6 mg/dL (ref 8.4–10.5)
CO2: 32 meq/L (ref 19–32)
CREATININE: 0.68 mg/dL (ref 0.40–1.50)
Chloride: 103 mEq/L (ref 96–112)
GFR: 127.12 mL/min (ref >60.00)
Glucose, Bld: 90 mg/dL (ref 70–99)
Potassium: 3.6 mEq/L (ref 3.5–5.1)
SODIUM: 142 meq/L (ref 135–145)
Total Protein: 7.1 g/dL (ref 6.0–8.3)

## 2017-08-22 MED ORDER — ZOLPIDEM TARTRATE ER 12.5 MG PO TBCR: 12.5000 mg | EXTENDED_RELEASE_TABLET | Freq: Every evening | ORAL | 5 refills | Status: DC | PRN

## 2017-08-22 MED ORDER — METRONIDAZOLE 0.75 % EX GEL: CUTANEOUS | 6 refills | Status: DC

## 2017-08-22 NOTE — Progress Notes (Signed)
OFFICE VISIT  08/22/2017   CC:  Chief Complaint  Patient presents with  . Follow-up    RCI, pt is not fasting.   HPI:    Patient is a 56 y.o. Caucasian male who presents for 3 mo f/u HTN. He also has struggled with chronic anxiety and insomnia. Also, hx of subdural hematoma 01/2017, and in review of his EMR it appears [facility] neurosurgery made at least 3 attempts to contact him and set up follow up appt and f/u noncontrast CT head, but were not able to reach him.  He says he has not seen the neurosurgeon since hospitalization 01/2017, says he has neurosurg appt at [facility] 09/12/17. No HAs, no dizziness.  Hx of alc abuse and tob dependence.   Denies an alcohol--6 mo ago quit. Tobacco: still smoking.  Insurance would not pay for nicotine patches I rx'd last visit.  HTN: home bp's 120s/70s consistently.  Not monitoring pulse.  Currently taking 400mg  quetiapine, then 1 hour later a 12.5mg  ambien CR, and trazodone 50mg  qhs. This helps him sleep adequately.  Says he has not taken any ambien in the last 1 mo.  Reddish rash on both cheeks lately, has long hx of this coming and going.  Flaky on surface, some pain involved, some itching of nose.    Past Medical History:  Diagnosis Date  . Alcoholism ([place])    Continues to abuse ETOH as of 01/2017; pt has poor insight into his problem.  Pt states that as of 04/2017 he has been 3 monthds sober.  . Anxiety and depression   . Arthritis   . Chronic joint pain   . COPD (chronic obstructive pulmonary disease) ([place])   . Depression   . Frequent headaches   . History of kidney stones   . Hypertension   . Insomnia   . Pneumothorax, right   . Polysubstance abuse ([place])   . Poor historian    UNRELIABLE HISTORIAN  . Stroke ([place]) 2012   LUE residual deficit: flexion contracture and weakness of L hand.  Old bilateral cerebral infarcts involving the right frontal and parietal lobes as well as the left occipital lobe  . Subdural hematoma ([place])  01/29/2017   Subacute right-sided frontal subdural hematoma: found on CT in [place] ED, transferred to [facility] where this was non-operatively managed.  Pt admitted to the MDs at [facility] that he still drinks lots of whisky daily and they surmised that his frequent falls of late are due to this and frequent use of OTC sleep aids.  . Venous insufficiency of both lower extremities     Past Surgical History:  Procedure Laterality Date  . CHEST TUBE INSERTION    . LUNG SURGERY    . SHOULDER SURGERY     Multiple rotator cuff surgeries on both sides.  Marland Kitchen VIDEO ASSISTED THORACOSCOPY (VATS) W/TALC PLEUADESIS  2010    Outpatient Medications Prior to Visit  Medication Sig Dispense Refill  . amLODipine (NORVASC) 10 MG tablet TAKE 1 TABLET BY MOUTH EVERY DAY 30 tablet 3  . DULoxetine (CYMBALTA) 60 MG capsule TAKE ONE CAPSULE BY MOUTH EVERY DAY 90 capsule 0  . irbesartan (AVAPRO) 300 MG tablet 1/2 tab po qd 90 tablet 1  . metoprolol succinate (TOPROL-XL) 50 MG 24 hr tablet Take 1 tablet (50 mg total) by mouth daily. Take with or immediately following a meal. 90 tablet 1  . QUEtiapine (SEROQUEL) 400 MG tablet Take 1 tablet (400 mg total) by mouth at bedtime. 90 tablet  0  . traZODone (DESYREL) 50 MG tablet 1-2 tabs po qhs prn insomnia 180 tablet 0  . zolpidem (AMBIEN CR) 12.5 MG CR tablet Take 1 tablet (12.5 mg total) by mouth at bedtime as needed for sleep. 30 tablet 0  . irbesartan (AVAPRO) 300 MG tablet TAKE 1 TABLET BY MOUTH EVERY DAY (Patient not taking: Reported on 08/22/2017) 90 tablet 1  . varenicline (CHANTIX CONTINUING MONTH PAK) 1 MG tablet Take 1 tablet (1 mg total) by mouth 2 (two) times daily. (Patient not taking: Reported on 08/22/2017) 60 tablet 1   No facility-administered medications prior to visit.     No Known Allergies  ROS As per HPI  PE: Blood pressure 124/76, pulse 72, temperature (!) 97.5 F (36.4 C), temperature source Oral, resp. rate 16, height 5\' 7"  (1.702 m), weight 175 lb 4 oz  (79.5 kg), SpO2 99 %. Gen: Alert, well appearing.  Patient is oriented to person, place, time, and situation. AFFECT: pleasant, lucid thought and speech. Skin: face with deep pink/erythematous macular rash with some tiny scattered papulovesicles, coalescent in distribution over both cheeks, some extending into bearded region/chin---with some superficial flaking of skin in bearded area. CV: RRR, no m/r/g.   LUNGS: CTA bilat, nonlabored resps, good aeration in all lung fields. EXT: no clubbing, cyanosis, or edema.    LABS:  Lab Results  Component Value Date   TSH 1.58 11/21/2016   Lab Results  Component Value Date   WBC 7.2 01/29/2017   HGB 13.0 (A) 01/29/2017   HCT 39 (A) 01/29/2017   MCV 98.5 01/28/2017   PLT 343 01/29/2017   Lab Results  Component Value Date   CREATININE 0.96 05/09/2017   BUN 10 05/09/2017   NA 140 05/09/2017   K 3.4 (L) 05/09/2017   CL 105 05/09/2017   CO2 25 05/09/2017   Lab Results  Component Value Date   ALT 11 (L) 01/28/2017   AST 19 01/28/2017   ALKPHOS 86 01/28/2017   BILITOT 0.8 01/28/2017   Lab Results  Component Value Date   CHOL 112 12/20/2016   Lab Results  Component Value Date   HDL 54 12/20/2016   Lab Results  Component Value Date   LDLCALC 22 12/20/2016   Lab Results  Component Value Date   TRIG 178 (H) 12/20/2016   Lab Results  Component Value Date   CHOLHDL 2.1 12/20/2016   Lab Results  Component Value Date   HGBA1C (H) 11/02/2010    5.8 (NOTE)                                                                       According to the ADA Clinical Practice Recommendations for 2011, when HbA1c is used as a screening test:   >=6.5%   Diagnostic of Diabetes Mellitus           (if abnormal result  is confirmed)  5.7-6.4%   Increased risk of developing Diabetes Mellitus  References:Diagnosis and Classification of Diabetes Mellitus,Diabetes VELF,8101,75(ZWCHE 1):S62-S69 and Standards of Medical Care in         Diabetes -  2011,Diabetes Care,2011,34  (Suppl 1):S11-S61.    IMPRESSION AND PLAN:  1) HTN: The current medical regimen is effective;  continue present  plan and medications. Lytes/cr today.  2) Insomnia: The current medical regimen is effective;  continue present plan and medications. See HPI for details.  Ambien CR 12.5 mg, 1 qhs, #30, RF x 5--rx given today to pt. CSC discussed and in chart.   Check UDS today.  He states he has not taken his ambien in about a month, also states he has not had any alcohol in 6 mo.  3) Rosacea: start metronidazole 0.75% gel, bid to affected areas.  4) Alcohol abuse: states he quit 6 mo ago. Congratulated him on this. Check hepatic panel.  5) Tob dependence: he is not contemplating another cessation trial at this time. Chantix no help, insurance won't cover nicotine replacement and he won't pay for it out of pocket.  6) hx of subdural hematoma: has not followed up as instructed with [facility] neurosurgery. Denies any HAs, dizziness, or new neurologic sx's (hx of CVA). Check CBC today.  He has neurosurg f/u appt at [facility] for next month.   An After Visit Summary was printed and given to the patient.  FOLLOW UP: Return in about 3 months (around 11/22/2017) for routine chronic illness f/u (Ambien--controlled).  Signed:  Crissie Sickles, MD           08/22/2017

## 2017-08-23 ENCOUNTER — Emergency Department: Payer: Medicare Other

## 2017-08-23 ENCOUNTER — Emergency Department
Admission: EM | Admit: 2017-08-23 | Discharge: 2017-08-24 | Disposition: A | Discharge status: Home / Self Care | Payer: Medicare Other | Attending: Emergency Medicine | Admitting: Emergency Medicine

## 2017-08-23 ENCOUNTER — Encounter: Payer: Self-pay | Admitting: Emergency Medicine

## 2017-08-23 DIAGNOSIS — Z79899 Other long term (current) drug therapy: Secondary | ICD-10-CM | POA: Insufficient documentation

## 2017-08-23 DIAGNOSIS — S59902A Unspecified injury of left elbow, initial encounter: Secondary | ICD-10-CM | POA: Diagnosis not present

## 2017-08-23 DIAGNOSIS — Z8673 Personal history of transient ischemic attack (TIA), and cerebral infarction without residual deficits: Secondary | ICD-10-CM | POA: Insufficient documentation

## 2017-08-23 DIAGNOSIS — R4182 Altered mental status, unspecified: Secondary | ICD-10-CM | POA: Insufficient documentation

## 2017-08-23 DIAGNOSIS — T426X5A Adverse effect of other antiepileptic and sedative-hypnotic drugs, initial encounter: Secondary | ICD-10-CM | POA: Insufficient documentation

## 2017-08-23 DIAGNOSIS — T50905A Adverse effect of unspecified drugs, medicaments and biological substances, initial encounter: Secondary | ICD-10-CM

## 2017-08-23 DIAGNOSIS — F17210 Nicotine dependence, cigarettes, uncomplicated: Secondary | ICD-10-CM | POA: Insufficient documentation

## 2017-08-23 DIAGNOSIS — J449 Chronic obstructive pulmonary disease, unspecified: Secondary | ICD-10-CM | POA: Insufficient documentation

## 2017-08-23 DIAGNOSIS — R296 Repeated falls: Secondary | ICD-10-CM | POA: Diagnosis not present

## 2017-08-23 DIAGNOSIS — S6992XA Unspecified injury of left wrist, hand and finger(s), initial encounter: Secondary | ICD-10-CM | POA: Diagnosis not present

## 2017-08-23 DIAGNOSIS — M79642 Pain in left hand: Secondary | ICD-10-CM | POA: Diagnosis not present

## 2017-08-23 DIAGNOSIS — M7989 Other specified soft tissue disorders: Secondary | ICD-10-CM | POA: Diagnosis not present

## 2017-08-23 LAB — CBC WITH DIFFERENTIAL/PLATELET
Basophils Absolute: 0.1 10*3/uL (ref 0–0.1)
Basophils Relative: 1 %
EOS PCT: 1 %
Eosinophils Absolute: 0.1 10*3/uL (ref 0–0.7)
HCT: 42.9 % (ref 40.0–52.0)
Hemoglobin: 14.7 g/dL (ref 13.0–18.0)
LYMPHS PCT: 11 %
Lymphs Abs: 1.3 10*3/uL (ref 1.0–3.6)
MCH: 31.9 pg (ref 26.0–34.0)
MCHC: 34.4 g/dL (ref 32.0–36.0)
MCV: 92.7 fL (ref 80.0–100.0)
MONO ABS: 0.6 10*3/uL (ref 0.2–1.0)
Monocytes Relative: 5 %
Neutro Abs: 9.8 10*3/uL — ABNORMAL HIGH (ref 1.4–6.5)
Neutrophils Relative %: 82 %
PLATELETS: 359 10*3/uL (ref 150–440)
RBC: 4.62 MIL/uL (ref 4.40–5.90)
RDW: 14.2 % (ref 11.5–14.5)
WBC: 12 10*3/uL — ABNORMAL HIGH (ref 3.8–10.6)

## 2017-08-23 LAB — URINE DRUG SCREEN, QUALITATIVE (ARMC ONLY)
Amphetamines, Ur Screen: NOT DETECTED
BENZODIAZEPINE, UR SCRN: NOT DETECTED
Barbiturates, Ur Screen: NOT DETECTED
Cannabinoid 50 Ng, Ur ~~LOC~~: POSITIVE — AB
Cocaine Metabolite,Ur ~~LOC~~: NOT DETECTED
MDMA (ECSTASY) UR SCREEN: NOT DETECTED
Methadone Scn, Ur: NOT DETECTED
Opiate, Ur Screen: NOT DETECTED
PHENCYCLIDINE (PCP) UR S: NOT DETECTED
Tricyclic, Ur Screen: POSITIVE — AB

## 2017-08-23 LAB — SALICYLATE LEVEL: Salicylate Lvl: <7 mg/dL (ref 2.8–30.0)

## 2017-08-23 LAB — URINALYSIS, COMPLETE (UACMP) WITH MICROSCOPIC
Bacteria, UA: NONE SEEN
Bilirubin Urine: NEGATIVE
GLUCOSE, UA: NEGATIVE mg/dL
Hgb urine dipstick: NEGATIVE
Ketones, ur: NEGATIVE mg/dL
Leukocytes, UA: NEGATIVE
Nitrite: NEGATIVE
PH: 7 (ref 5.0–8.0)
Protein, ur: 30 mg/dL — AB
SPECIFIC GRAVITY, URINE: 1.01 (ref 1.005–1.030)
SQUAMOUS EPITHELIAL / LPF: NONE SEEN

## 2017-08-23 LAB — ACETAMINOPHEN LEVEL: Acetaminophen (Tylenol), Serum: <10 ug/mL — ABNORMAL LOW (ref 10–30)

## 2017-08-23 LAB — COMPREHENSIVE METABOLIC PANEL
ALBUMIN: 4.3 g/dL (ref 3.5–5.0)
ALK PHOS: 125 U/L (ref 38–126)
ALT: 9 U/L — ABNORMAL LOW (ref 17–63)
AST: 19 U/L (ref 15–41)
Anion gap: 11 (ref 5–15)
BILIRUBIN TOTAL: 0.7 mg/dL (ref 0.3–1.2)
BUN: 9 mg/dL (ref 6–20)
CALCIUM: 9.6 mg/dL (ref 8.9–10.3)
CO2: 25 mmol/L (ref 22–32)
CREATININE: 0.85 mg/dL (ref 0.61–1.24)
Chloride: 101 mmol/L (ref 101–111)
GFR calc Af Amer: >60 mL/min (ref >60)
GLUCOSE: 123 mg/dL — AB (ref 65–99)
Potassium: 3.2 mmol/L — ABNORMAL LOW (ref 3.5–5.1)
Sodium: 137 mmol/L (ref 135–145)
TOTAL PROTEIN: 8.4 g/dL — AB (ref 6.5–8.1)

## 2017-08-23 LAB — ETHANOL: Alcohol, Ethyl (B): <10 mg/dL (ref <10)

## 2017-08-23 NOTE — ED Provider Notes (Addendum)
[facility] Emergency Department Provider Note  ____________________________________________   I have reviewed the triage vital signs and the nursing notes.   HISTORY  Chief Complaint Altered Mental Status; Fall; Arm Pain; and Back Pain    HPI [name] is a 56 y.o. male who presents today complaining of "feeling fine".  Patient, per reports, took Ambien and "trashed his house".  Patient states that this did not affect Any took Ambien and became convinced that his granddaughter was hiding somewhere in the house and try to find her.  He was reassured multiple times his granddaughter was not there.  At this time he believes it was a "dream".  He has no SI or HI he does not take extra Ambien he states he takes 1-2 every night.  He has been taking it for some time.  He denies any other ingestion.  Patient states that in "tearing up" his house he was moving furniture around in his hand hurts.  He does not have any acute injury he believes but he would not mind an x-ray.  He denies any headache or head injury.  Patient does have a history of polysubstance abuse frequent headaches and a medic head bleed in the past.  He denies any of those symptoms.  He thinks that the Ambien made him have a dream that caused him to act crazy. Attempts to phone call the family go to a voice mailbox is not set up no other numbers available, no other complaints from the patient he has no SI or HI he denies any hallucinations and he is very compliant with exam    Past Medical History:  Diagnosis Date  . Alcoholism ([place])    Continues to abuse ETOH as of 01/2017; pt has poor insight into his problem.  Pt states that as of 04/2017 he has been 3 monthds sober.  . Anxiety and depression   . Arthritis   . Chronic joint pain   . COPD (chronic obstructive pulmonary disease) ([place])   . Depression   . Frequent headaches   . History of kidney stones   . Hypertension   . Insomnia   . Pneumothorax,  right   . Polysubstance abuse ([place])   . Poor historian    UNRELIABLE HISTORIAN  . Stroke ([place]) 2012   LUE residual deficit: flexion contracture and weakness of L hand.  Old bilateral cerebral infarcts involving the right frontal and parietal lobes as well as the left occipital lobe  . Subdural hematoma ([place]) 01/29/2017   Subacute right-sided frontal subdural hematoma: found on CT in [place] ED, transferred to [facility] where this was non-operatively managed.  Pt admitted to the MDs at [facility] that he still drinks lots of whisky daily and they surmised that his frequent falls of late are due to this and frequent use of OTC sleep aids.  . Venous insufficiency of both lower extremities     Patient Active Problem List   Diagnosis Date Noted  . Depression   . TOBACCO ABUSE 11/08/2008  . C O P D 11/08/2008  . Pneumothorax 10/19/2008  . PNEUMOTHORAX 10/19/2008    Past Surgical History:  Procedure Laterality Date  . CHEST TUBE INSERTION    . LUNG SURGERY    . SHOULDER SURGERY     Multiple rotator cuff surgeries on both sides.  Marland Kitchen VIDEO ASSISTED THORACOSCOPY (VATS) W/TALC PLEUADESIS  2010    Prior to Admission medications   Medication Sig Start Date End Date Taking?  Authorizing Provider  amLODipine (NORVASC) 10 MG tablet TAKE 1 TABLET BY MOUTH EVERY DAY 05/26/17   McGowen, Adrian Blackwater, MD  DULoxetine (CYMBALTA) 60 MG capsule TAKE ONE CAPSULE BY MOUTH EVERY DAY 06/13/17   McGowen, Adrian Blackwater, MD  irbesartan (AVAPRO) 300 MG tablet 1/2 tab po qd 02/13/17   McGowen, Adrian Blackwater, MD  metoprolol succinate (TOPROL-XL) 50 MG 24 hr tablet Take 1 tablet (50 mg total) by mouth daily. Take with or immediately following a meal. 07/14/17   McGowen, Adrian Blackwater, MD  metroNIDAZOLE (METROGEL) 0.75 % gel Apply to facial rash twice daily 08/22/17   McGowen, Adrian Blackwater, MD  QUEtiapine (SEROQUEL) 400 MG tablet Take 1 tablet (400 mg total) by mouth at bedtime. 07/01/17   McGowen, Adrian Blackwater, MD  traZODone (DESYREL) 50 MG tablet 1-2 tabs po  qhs prn insomnia 06/13/17   McGowen, Adrian Blackwater, MD  zolpidem (AMBIEN CR) 12.5 MG CR tablet Take 1 tablet (12.5 mg total) by mouth at bedtime as needed for sleep. 08/22/17   McGowen, Adrian Blackwater, MD    Allergies Patient has no known allergies.  Family History  Problem Relation Age of Onset  . Arthritis Mother   . Heart disease Father   . Hypertension Father   . Breast cancer Maternal Aunt   . Alcohol abuse Maternal Uncle   . Lung cancer Maternal Aunt   . Cancer Brother        bone    Social History Social History  Substance Use Topics  . Smoking status: Current Every Day Smoker    Packs/day: 1.00    Years: 43.00    Types: Cigarettes  . Smokeless tobacco: Never Used  . Alcohol use No    Review of Systems Constitutional: No fever/chills Eyes: No visual changes. ENT: No sore throat. No stiff neck no neck pain Cardiovascular: Denies chest pain. Respiratory: Denies shortness of breath. Gastrointestinal:   no vomiting.  No diarrhea.  No constipation. Genitourinary: Negative for dysuria. Musculoskeletal: Negative lower extremity swelling Skin: Negative for rash. Neurological: Negative for severe headaches, focal weakness or numbness.   ____________________________________________   PHYSICAL EXAM:  VITAL SIGNS: ED Triage Vitals  Enc Vitals Group     BP 08/23/17 1644 (!) 147/89     Pulse Rate 08/23/17 1644 76     Resp 08/23/17 1644 18     Temp 08/23/17 1644 99 F (37.2 C)     Temp Source 08/23/17 1644 Oral     SpO2 08/23/17 1644 100 %     Weight 08/23/17 1644 175 lb (79.4 kg)     Height 08/23/17 1644 5\' 7"  (1.702 m)     Head Circumference --      Peak Flow --      Pain Score 08/23/17 1643 10     Pain Loc --      Pain Edu? --      Excl. in [place]? --     Constitutional: Alert and oriented. Well appearing and in no acute distress. Eyes: Conjunctivae are normal Head: Atraumatic HEENT: No congestion/rhinnorhea. Mucous membranes are moist.  Oropharynx  non-erythematous Neck:   Nontender with no meningismus, no masses, no stridor Cardiovascular: Normal rate, regular rhythm. Grossly normal heart sounds.  Good peripheral circulation. Respiratory: Normal respiratory effort.  No retractions. Lungs CTAB. Abdominal: Soft and nontender. No distention. No guarding no rebound Back:  There is no focal tenderness or step off.  there is no midline tenderness there are no lesions noted. there is  no CVA tenderness Musculoskeletal: No lower extremity tenderness, it is somewhat contracted on the left side from a stroke, there is minimal tenderness to palpation with no evidence of fracture.  Abrasions noted. Neurologic:  Normal speech and language.  Some baseline left-sided weakness but no change in.  Skin:  Skin is warm, dry and intact. No rash noted. Psychiatric: Mood and affect are normal. Speech and behavior are normal.  ____________________________________________   LABS (all labs ordered are listed, but only abnormal results are displayed)  Labs Reviewed  COMPREHENSIVE METABOLIC PANEL - Abnormal; Notable for the following:       Result Value   Potassium 3.2 (*)    Glucose, Bld 123 (*)    Total Protein 8.4 (*)    ALT 9 (*)    All other components within normal limits  ACETAMINOPHEN LEVEL - Abnormal; Notable for the following:    Acetaminophen (Tylenol), Serum <10 (*)    All other components within normal limits  CBC WITH DIFFERENTIAL/PLATELET - Abnormal; Notable for the following:    WBC 12.0 (*)    Neutro Abs 9.8 (*)    All other components within normal limits  URINE DRUG SCREEN, QUALITATIVE ([facility] ONLY) - Abnormal; Notable for the following:    Tricyclic, Ur Screen POSITIVE (*)    Cannabinoid 50 Ng, Ur [place] POSITIVE (*)    All other components within normal limits  URINALYSIS, COMPLETE (UACMP) WITH MICROSCOPIC - Abnormal; Notable for the following:    Color, Urine YELLOW (*)    APPearance CLEAR (*)    Protein, ur 30 (*)    All other  components within normal limits  ETHANOL  SALICYLATE LEVEL    Pertinent labs  results that were available during my care of the patient were reviewed by me and considered in my medical decision making (see chart for details). ____________________________________________  EKG  I personally interpreted any EKGs ordered by me or triage  ____________________________________________  RADIOLOGY  Pertinent labs & imaging results that were available during my care of the patient were reviewed by me and considered in my medical decision making (see chart for details). If possible, patient and/or family made aware of any abnormal findings. ____________________________________________    PROCEDURES  Procedure(s) performed: None  Procedures  Critical Care performed: None  ____________________________________________   INITIAL IMPRESSION / ASSESSMENT AND PLAN / ED COURSE  Pertinent labs & imaging results that were available during my care of the patient were reviewed by me and considered in my medical decision making (see chart for details).  Patient here with erratic behavior after taking Ambien.  This is a known side effect of the drug.  He has no ongoing SI or HI, there is no one answering the phone from his family.  He is calm and compliant, no evidence of obvious injury, no evidence of obvious toxidrome, he does not meet criteria for acute inpatient involuntary psychiatric hold and there is no one here from the family with whom I can talk.  CT scan of the head is reassuring blood work is reassuring, vital signs are reassuring, she states that there is no children in the house with him and this was a bad dream.  We have advised him not to take Ambien at this time.  We will continue to try to find the family and if we cannot we cannot keep the patient here against his will  ----------------------------------------- 8:40 PM on  08/23/2017 -----------------------------------------  Patient here voluntarily at this time I did  try calling [phone] which is his son's phone number according to the patient.  There is no answer and I left a message.  At this time, the patient would prefer to stay here we will see if we can get a psychiatric consultation, he does not meet criteria for IVC and family apparently are not responding.   ----------------------------------------- 8:46 PM on 08/23/2017 ----------------------------------------- Discussed with Darnelle Maffucci, his son, they do not feel unsafe around him, they are happy to come pick him up but will be a few hours before they can get here.    ____________________________________________   FINAL CLINICAL IMPRESSION(S) / ED DIAGNOSES  Final diagnoses:  None      This chart was dictated using voice recognition software.  Despite best efforts to proofread,  errors can occur which can change meaning.      Schuyler Amor, MD 08/23/17 1914    Schuyler Amor, MD 08/23/17 2041    Schuyler Amor, MD 08/23/17 2047

## 2017-08-23 NOTE — Discharge Instructions (Signed)
Do not take Ambien anymore, did not use recreational drugs, return to the emergency room for any new or worrisome symptoms including thoughts of hurting yourself or anyone else.

## 2017-08-23 NOTE — ED Notes (Signed)
Pt provided his personalcell phone with approval of MD and RN, pt pending discharge and needing transportation home,pt currently sitting on side of bed attempting to locate son [name] phone number

## 2017-08-23 NOTE — ED Notes (Signed)
Pt c/o pain to middle back. Pt states he lives with his son Darnelle Maffucci.

## 2017-08-23 NOTE — ED Notes (Signed)
Plan of care is to give pt his phone in attempt to reach his son to pick him up.

## 2017-08-23 NOTE — ED Notes (Signed)
Patient vol and is pending discharge.

## 2017-08-23 NOTE — ED Notes (Signed)
Patient is resting comfortably. 

## 2017-08-23 NOTE — ED Notes (Signed)
Pt informed that his son will be picking him up at midnight.

## 2017-08-23 NOTE — ED Notes (Signed)
Discussed patient's care with Dr. Cherylann Banas, VORB received at this time, see orders.

## 2017-08-23 NOTE — ED Notes (Signed)
Pt's son Darnelle Maffucci listed in Demographics will pick patient up should he be discharged.

## 2017-08-23 NOTE — ED Triage Notes (Signed)
Pt presents to ED with his son. Per pt's son last night pt "went crazy and destroyed the house". Pt states last night he took an [place] as prescribed and went to sleep then approx 1-2 hrs after he went to sleep "all hell broke loose". Pt's son states that he was awoken by his father "acting crazy" and hallucinating at home. Pt and son report multiple falls last night, pt reports mid-lower back pain, bilateral arm pain, head pain (pt and son report hx of multiple head bleeds). Pt's son reports that approx 15 ambien are missing from his bottle, also reports that earlier today pt stated that he wanted to kill himself, at this time patient denies any SI/HI. Pt is noted to be alert and oriented to person, place, time, and situation, but noted to have very mild intermittent confusion.

## 2017-08-26 ENCOUNTER — Encounter: Payer: Self-pay | Admitting: Family Medicine

## 2017-08-26 DIAGNOSIS — G4700 Insomnia, unspecified: Secondary | ICD-10-CM

## 2017-08-26 HISTORY — DX: Insomnia, unspecified: G47.00

## 2017-08-26 LAB — PAIN MGMT, PROFILE 8 W/CONF, U
6 Acetylmorphine: NEGATIVE ng/mL (ref <10)
ALCOHOL METABOLITES: NEGATIVE ng/mL (ref <500)
ALPHAHYDROXYMIDAZOLAM: NEGATIVE ng/mL (ref <50)
Alphahydroxyalprazolam: NEGATIVE ng/mL (ref <25)
Alphahydroxytriazolam: NEGATIVE ng/mL (ref <50)
Aminoclonazepam: NEGATIVE ng/mL (ref <25)
Amphetamines: NEGATIVE ng/mL (ref <500)
Benzodiazepines: NEGATIVE ng/mL (ref <100)
Buprenorphine, Urine: NEGATIVE ng/mL (ref <5)
COCAINE METABOLITE: NEGATIVE ng/mL (ref <150)
Creatinine: 85.7 mg/dL
HYDROXYETHYLFLURAZEPAM: NEGATIVE ng/mL (ref <50)
Lorazepam: NEGATIVE ng/mL (ref <50)
MARIJUANA METABOLITE: 389 ng/mL — AB (ref <5)
MARIJUANA METABOLITE: POSITIVE ng/mL — AB (ref <20)
MDMA: NEGATIVE ng/mL (ref <500)
NORDIAZEPAM: NEGATIVE ng/mL (ref <50)
OXYCODONE: NEGATIVE ng/mL (ref <100)
Opiates: NEGATIVE ng/mL (ref <100)
Oxazepam: NEGATIVE ng/mL (ref <50)
Oxidant: NEGATIVE ug/mL (ref <200)
Temazepam: NEGATIVE ng/mL (ref <50)
pH: 6.9 (ref 4.5–9.0)

## 2017-09-04 ENCOUNTER — Other Ambulatory Visit: Payer: Self-pay | Admitting: *Deleted

## 2017-09-04 MED ORDER — DULOXETINE HCL 60 MG PO CPEP: 60.0000 mg | ORAL_CAPSULE | Freq: Every day | ORAL | 1 refills | Status: DC

## 2017-09-12 ENCOUNTER — Encounter: Payer: Self-pay | Admitting: Neurology

## 2017-09-12 ENCOUNTER — Ambulatory Visit (INDEPENDENT_AMBULATORY_CARE_PROVIDER_SITE_OTHER): Payer: Medicare Other | Admitting: Neurology

## 2017-09-12 VITALS — BP 130/90 | HR 59 | Ht 67.0 in | Wt 173.0 lb

## 2017-09-12 DIAGNOSIS — I679 Cerebrovascular disease, unspecified: Secondary | ICD-10-CM | POA: Diagnosis not present

## 2017-09-12 DIAGNOSIS — F17210 Nicotine dependence, cigarettes, uncomplicated: Secondary | ICD-10-CM | POA: Diagnosis not present

## 2017-09-12 DIAGNOSIS — G8110 Spastic hemiplegia affecting unspecified side: Secondary | ICD-10-CM | POA: Diagnosis not present

## 2017-09-12 DIAGNOSIS — Z8673 Personal history of transient ischemic attack (TIA), and cerebral infarction without residual deficits: Secondary | ICD-10-CM | POA: Diagnosis not present

## 2017-09-12 DIAGNOSIS — Z720 Tobacco use: Secondary | ICD-10-CM

## 2017-09-12 DIAGNOSIS — R269 Unspecified abnormalities of gait and mobility: Secondary | ICD-10-CM | POA: Diagnosis not present

## 2017-09-12 MED ORDER — BACLOFEN 10 MG PO TABS: 10.0000 mg | ORAL_TABLET | Freq: Three times a day (TID) | ORAL | 3 refills | Status: DC

## 2017-09-12 NOTE — Progress Notes (Signed)
[facility] Clinic Note - Initial Visit   Date: 09/12/17  [name] MRN: [id number] DOB: [birth year]   Dear Dr. Anitra Lauth:  Thank you for your kind referral of [name] for consultation of left hand spasiticty. Although his history is well known to you, please allow [place] to reiterate it for the purpose of our medical record. The patient was accompanied to the clinic by daughter who also provides collateral information.     History of Present Illness: [name] is a 56 y.o. right-handed Caucasian male with history of R ischemic stroke due to R ICA thrombus (2012, not deemed candidate for anticoagulation), subdural hematoma (01/2017), depression, COPD, tobacco abuse, and alcohol abuse (sober since summer 2018) presenting for evaluation of left arm spasticity.    Patient had stroke in 2012 manifesting with left arm and leg weakness/numbness due to R ICA thrombus.  Because of medication noncompliance, he was managed with aspirin 81mg  daily, which he took for sometime and then discontinued.  He has residual left sided weakness, especially with the left arm which essentially nonfunctional.  He is able to raise the left arm, but unable to grasp objects due to the finger flexion contracture.  He is here for management options.    In April 2018, he suffered a fall complicated by subdural hematoma for which he was transferred to [facility] and managed conservatively.  He was drinking fifth whiskey nightly after his stroke as a means to forget about his disabilty.  He continued to drink like this for many years and has been sober in 2018.  Out-side paper records, electronic medical record, and images have been reviewed where available and summarized as:  Lab Results  Component Value Date   CHOL 112 12/20/2016   HDL 54 12/20/2016   LDLCALC 22 12/20/2016   TRIG 178 (H) 12/20/2016   CHOLHDL 2.1 12/20/2016   MRI/A brain 02/07/2011: Small areas of acute infarct in the high  right frontal lobe and also in the right parietal lobe.  Chronic hemorrhagic infarctions bilaterally.  CT head wo contrast 01/28/2017:  Subdural hematoma on the right with what appears to be subacute hemorrhage. There is moderate mass effect on the right frontal, temporal, and parietal lobes with a maximum thickness of this subdural hematoma of 12 mm. No midline shift. No other extra-axial fluid. No acute intra-axial hemorrhage evident.   Past Medical History:  Diagnosis Date  . Alcoholism ([place])    Continues to abuse ETOH as of 01/2017; pt has poor insight into his problem.  Pt states that as of 04/2017 he has been 3 monthds sober.  . Anxiety and depression   . Arthritis   . Chronic joint pain   . COPD (chronic obstructive pulmonary disease) ([place])   . Depression   . Frequent headaches   . History of kidney stones   . Hypertension   . Insomnia 08/26/2017   08/23/17 ED visit for adverse rxn to taking ambien, PLUS + marijuana on UDS her and in ED---NO FURTHER AMBIEN OR ANY OTHER CONTROLLED SUBSTANCES WILL BE PRESCRIBED BY ME--PM  . Pneumothorax, right   . Polysubstance abuse ([place])   . Poor historian    UNRELIABLE HISTORIAN  . Stroke ([place]) 2012   LUE residual deficit: flexion contracture and weakness of L hand.  Old bilateral cerebral infarcts involving the right frontal and parietal lobes as well as the left occipital lobe  . Subdural hematoma ([place]) 01/29/2017   Subacute right-sided frontal subdural hematoma:  found on CT in [place] ED, transferred to [facility] where this was non-operatively managed.  Pt admitted to the MDs at [facility] that he still drinks lots of whisky daily and they surmised that his frequent falls of late are due to this and frequent use of OTC sleep aids.  . Venous insufficiency of both lower extremities     Past Surgical History:  Procedure Laterality Date  . CHEST TUBE INSERTION    . LUNG SURGERY    . SHOULDER SURGERY     Multiple rotator cuff surgeries on both sides.  [facility]  VIDEO ASSISTED THORACOSCOPY (VATS) W/TALC PLEUADESIS  2010     Medications:  Outpatient Encounter Medications as of 09/12/2017  Medication Sig  . amLODipine (NORVASC) 10 MG tablet TAKE 1 TABLET BY MOUTH EVERY DAY  . DULoxetine (CYMBALTA) 60 MG capsule Take 1 capsule (60 mg total) daily by mouth.  . irbesartan (AVAPRO) 300 MG tablet 1/2 tab po qd  . metoprolol succinate (TOPROL-XL) 50 MG 24 hr tablet Take 1 tablet (50 mg total) by mouth daily. Take with or immediately following a meal.  . metroNIDAZOLE (METROGEL) 0.75 % gel Apply to facial rash twice daily  . QUEtiapine (SEROQUEL) 400 MG tablet Take 1 tablet (400 mg total) by mouth at bedtime.  . traZODone (DESYREL) 50 MG tablet 1-2 tabs po qhs prn insomnia  . zolpidem (AMBIEN CR) 12.5 MG CR tablet Take 1 tablet (12.5 mg total) by mouth at bedtime as needed for sleep.   No facility-administered encounter medications on file as of 09/12/2017.      Allergies: No Known Allergies  Family History: Family History  Problem Relation Age of Onset  . Arthritis Mother   . Heart disease Father   . Hypertension Father   . Breast cancer Maternal Aunt   . Alcohol abuse Maternal Uncle   . Lung cancer Maternal Aunt   . Cancer Brother        bone    Social History: Social History   Tobacco Use  . Smoking status: Current Every Day Smoker    Packs/day: 1.00    Years: 43.00    Pack years: 43.00    Types: Cigarettes  . Smokeless tobacco: Never Used  Substance Use Topics  . Alcohol use: No  . Drug use: Yes    Frequency: 2.0 times per week    Types: Marijuana    Comment: 2 joints/week   Social History   Social History Narrative   Divorced, 1 son and 1 daughter in [place].   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   Smokes: 80 pack-yr hx, current as of 10/2016.   Alc: 1-2 pint of Jim Beam per week.   Was in AA > 20 yrs ago.   Marijuana regularly.   "Lots of drugs" in the past.    Review of Systems:  CONSTITUTIONAL: No  fevers, chills, night sweats, or weight loss.   EYES: No visual changes or eye pain ENT: No hearing changes.  No history of nose bleeds.   RESPIRATORY: No cough, wheezing and shortness of breath.   CARDIOVASCULAR: Negative for chest pain, and palpitations.   GI: Negative for abdominal discomfort, blood in stools or black stools.  No recent change in bowel habits.   GU:  No history of incontinence.   MUSCLOSKELETAL: No history of joint pain or swelling.  No myalgias.   SKIN: Negative for lesions, rash, and itching.   HEMATOLOGY/ONCOLOGY: Negative for prolonged bleeding, bruising easily, and  swollen nodes.  No history of cancer.   ENDOCRINE: Negative for cold or heat intolerance, polydipsia or goiter.   PSYCH:  +depression or anxiety symptoms.   NEURO: As Above.   Vital Signs:  BP 130/90   Pulse (!) 59   Ht 5\' 7"  (1.702 m)   Wt 173 lb (78.5 kg)   SpO2 97%   BMI 27.10 kg/m    General Medical Exam:   General:  Well appearing, poorly groomed, strong tobacco odor, comfortable.   Eyes/ENT: see cranial nerve examination.   Neck: No masses appreciated.  Full range of motion without tenderness.  No carotid bruits. Respiratory:  Clear to auscultation, good air entry bilaterally.   Cardiac:  Regular rate and rhythm, no murmur.   Extremities:  No deformities, edema, or skin discoloration.  Skin:  No rashes or lesions.  Neurological Exam: MENTAL STATUS including orientation to time, place, person, recent and remote memory, attention span and concentration, language, and fund of knowledge is normal.  Speech is not dysarthric.  CRANIAL NERVES: II:  No visual field defects.  Unremarkable fundi.   III-IV-VI: Pupils equal round and reactive to light.  Normal conjugate, extra-ocular eye movements in all directions of gaze.  No nystagmus.  No ptosis.   V:  Normal facial sensation.     VII:  Normal facial symmetry and movements.    VIII:  Normal hearing and vestibular function.   IX-X:  Normal  palatal movement.   XI:  Normal shoulder shrug and head rotation.   XII:  Normal tongue strength and range of motion, no deviation or fasciculation.  MOTOR:  No atrophy, fasciculations or abnormal movements.  No pronator drift.  Spasticity is greater in the R biceps, BR, PT, FDP, OP.  Reduced ROM with shoulder abduction and wrist extension/flexion and finger extension.  Right Upper Extremity:    Left Upper Extremity:    Deltoid  5/5   Deltoid  5/5   Biceps  5/5   Biceps  5/5   Triceps  5/5   Triceps  5/5   Wrist extensors  5/5   Wrist extensors  4/5   Wrist flexors  5/5   Wrist flexors  4/5   Finger extensors  5/5   Finger extensors  4/5   Finger flexors  5/5   Finger flexors  4/5   Dorsal interossei  5/5   Dorsal interossei  4/5   Abductor pollicis  5/5   Abductor pollicis  4/5   Tone (Ashworth scale)  0  Tone (Ashworth scale)  1   Right Lower Extremity:    Left Lower Extremity:    Hip flexors  5/5   Hip flexors  5/5   Hip extensors  5/5   Hip extensors  5/5   Knee flexors  5/5   Knee flexors  5/5   Knee extensors  5/5   Knee extensors  5/5   Dorsiflexors  5/5   Dorsiflexors  5/5   Plantarflexors  5/5   Plantarflexors  5/5   Toe extensors  5/5   Toe extensors  5/5   Toe flexors  5/5   Toe flexors  5/5   Tone (Ashworth scale)  0  Tone (Ashworth scale)  0   MSRs:   Right  Left brachioradialis 2+  brachioradialis 4+  biceps 2+  biceps 4+  triceps 2+  triceps 4+  patellar 2+  patellar 2+  ankle jerk 2+  ankle jerk 2+  Hoffman no  Hoffman no  plantar response down  plantar response down   SENSORY:  Normal and symmetric perception of light touch, pinprick, vibration, and proprioception.  Romberg's sign absent.   COORDINATION/GAIT: Normal finger-to- nose-finger.  Intact rapid alternating movements bilaterally.  Able to rise from a chair without using arms.  Gait narrow based and stable. Unsteady with tandem  gait.   IMPRESSION: 1.  Left arm spasticity due to chronic effects of ischemic stroke.  He had difficulty with personal hygiene.  Will offer him a trial of baclofen 5mg  BID and titrate to 10mg  TID.  I will also initiate PA for Botox as he would be a good candidate for this.  In the meantime, start home Occupational therapy - left hand weakness and spasticity.  Risks and benefits of Botox discussed.  Patient was informed that botox would only help muscle spasticity and not the weakness.  He will have permanent weakness from his stroke and controlling risk factors is key.  Start aspirin 81mg  daily.  Cholesterol and BP is well-controlled.  2.  History of alcohol abuse, encouraged him to continue to abstain from alcohol  3.  Tobacco abuse with history of stroke.  He is currently smoking 1 packs/day.  atient was informed of the dangers of tobacco abuse including stroke, cancer, and MI, as well as benefits of tobacco cessation.  Patient is willing to quit at this time.  Approximately 7 mins were spent counseling patient cessation techniques. We discussed various methods to help quit smoking, including deciding on a date to quit, joining a support group, pharmacological agents- nicotine gum/patch/lozenges, chantix.  I will reassess his progress at his next follow-up visit  Return to clinic in 3 months.   The duration of this appointment visit was 60 minutes of face-to-face time with the patient.  Greater than 50% of this time was spent in counseling, explanation of diagnosis, planning of further management, and coordination of care.   Thank you for allowing me to participate in patient's care.  If I can answer any additional questions, I would be pleased to do so.    Sincerely,    Cylis Ayars K. Posey Pronto, DO

## 2017-09-12 NOTE — Patient Instructions (Addendum)
Start occupational therapy for left arm weakness  Baclofen 10 mg tablets    Morning       Afternoon        Evening  Day 1-5 1/2 tab                                1/2 tab              Day 6-10 1/2 tab         1/2 tab            1/2 tab              Day 11-15 1/2 tab         1/2 tab            1 tab                Day 16-20 1 tab            1/2 tab            1 tab                Continue 1 tab             1 tab              1 tab                 We will call you once we have an approval for Botox  Return to clinic in 3 months

## 2017-09-15 ENCOUNTER — Telehealth: Payer: Self-pay | Admitting: Neurology

## 2017-09-15 NOTE — Telephone Encounter (Signed)
Mariann Laster from [facility] called regarding needing to put order in to start tomorrow. Therapist is behind. Please Call. Thanks

## 2017-09-15 NOTE — Telephone Encounter (Signed)
Left message with Sharyn Lull that it will be ok to start therapy tomorrow.  She will give the message to [place].

## 2017-09-17 ENCOUNTER — Telehealth: Payer: Self-pay | Admitting: Neurology

## 2017-09-17 NOTE — Telephone Encounter (Signed)
[facility] called and would like a call back regarding pt CB# [phone]

## 2017-09-17 NOTE — Telephone Encounter (Signed)
Called [facility] back and no one answered and no voicemail set up.

## 2017-09-21 ENCOUNTER — Encounter: Payer: Self-pay | Admitting: Family Medicine

## 2017-09-21 ENCOUNTER — Other Ambulatory Visit: Payer: Self-pay | Admitting: Family Medicine

## 2017-09-22 ENCOUNTER — Other Ambulatory Visit: Payer: Self-pay | Admitting: *Deleted

## 2017-09-22 MED ORDER — AMLODIPINE BESYLATE 10 MG PO TABS: 10.0000 mg | ORAL_TABLET | Freq: Every day | ORAL | 1 refills | Status: DC

## 2017-09-22 NOTE — Telephone Encounter (Signed)
Requesting 90 day supply.

## 2017-09-23 ENCOUNTER — Telehealth: Payer: Self-pay | Admitting: Neurology

## 2017-09-23 NOTE — Telephone Encounter (Signed)
Amy (PT) with [facility] called regarding this patient. She has tried to contact patient and no one has returned her calls from the contact #'s that she has. He has not been admitted to the home health services. Thanks

## 2017-09-24 NOTE — Telephone Encounter (Signed)
FYI

## 2017-09-26 ENCOUNTER — Telehealth: Payer: Self-pay | Admitting: Neurology

## 2017-09-26 NOTE — Telephone Encounter (Signed)
Mariann Laster has been unable to reach this patient or his contacts to schedule his appointment. She is requesting a new start of care date. Please Call. Thanks

## 2017-10-17 ENCOUNTER — Other Ambulatory Visit: Payer: Self-pay | Admitting: Family Medicine

## 2017-11-05 ENCOUNTER — Telehealth: Payer: Self-pay | Admitting: Family Medicine

## 2017-11-05 NOTE — Telephone Encounter (Signed)
Spoke with patient regarding blood pressure.  Patient states he is currently taking Irbesartan 300 mg and Metoprolol 50 mg daily. He has not taken Amlodipine in over a month, stating he ran out. Patient reports occasional blurred vision, denies headache, dizziness, CP or SOB. Patient states he is home bound and unable to come in for appointment until next Friday. Patient requesting PCP refill Amlodipine.   When reviewing medications, patient went back and forth on if he was taking Irbesartan daily. In the beginning of conversation he stated he stopped taking it because it wasn't working, then he states he takes medication daily.   Please advise.

## 2017-11-05 NOTE — Telephone Encounter (Unsigned)
Copied from [place] [phone]. Topic: Quick Communication - See Telephone Encounter >> Nov 05, 2017  3:01 PM Percell Belt A wrote: CRM for notification. See Telephone encounter for:  pt called in and said that the  amLODipine (NORVASC) 10 MG tablet [[id number] is not working.  He said that his bp is still high and when he check it today it was over 202/114. Pt does not want to come in because he is disabled and not every easy to come in?  He took it while with were on the phone and it was 189/116  Pharmacy- [facility] [place]   11/05/17.

## 2017-11-05 NOTE — Telephone Encounter (Signed)
Noted  

## 2017-11-05 NOTE — Telephone Encounter (Signed)
Pls have [facility] triage him. Of note, he is supposed to be taking amlodipine 10mg  daily, Irbesartan 300mg  1/2 tab daily, and toprol xl 50mg  daily. If he still has these meds and has no signs of hypertensive urgency, then it would be reasonable to restart the meds he has not been taking and make office f/u with me tomorrow or the next day (or early next week). However, if he only has amlodipine and/or he has things like dizziness, HA, weakness, CP, SOB, etc then he needs to call 911 or go to nearest ER.  FYI--He is a difficult historian and has hx of noncompliance and alcohol abuse.  I would lean towards sending him to the ER if unsure at all. Let me know what's going on with him.-thx

## 2017-11-05 NOTE — Telephone Encounter (Signed)
Advised patient to go to the closest ER for evaluation and treatment, patient agrees to go. Offered to call EMS for patient, he states he will have one of his children drive him once they're home from work (after 6 pm). Plans to go to [facility].

## 2017-11-05 NOTE — Telephone Encounter (Signed)
Please advise. Thanks.  

## 2017-11-05 NOTE — Telephone Encounter (Signed)
Thank you for talking to him. Tell pt I recommend he go to the nearest ED or call 911. -thx (let me know what he says).

## 2017-12-12 ENCOUNTER — Ambulatory Visit: Payer: Medicare Other | Admitting: Neurology

## 2017-12-12 ENCOUNTER — Ambulatory Visit (INDEPENDENT_AMBULATORY_CARE_PROVIDER_SITE_OTHER): Payer: Medicare Other | Admitting: Neurology

## 2017-12-12 ENCOUNTER — Encounter: Payer: Self-pay | Admitting: Neurology

## 2017-12-12 VITALS — BP 140/88 | HR 62 | Resp 12

## 2017-12-12 DIAGNOSIS — Z720 Tobacco use: Secondary | ICD-10-CM

## 2017-12-12 DIAGNOSIS — G8110 Spastic hemiplegia affecting unspecified side: Secondary | ICD-10-CM

## 2017-12-12 DIAGNOSIS — I679 Cerebrovascular disease, unspecified: Secondary | ICD-10-CM

## 2017-12-12 DIAGNOSIS — F17210 Nicotine dependence, cigarettes, uncomplicated: Secondary | ICD-10-CM | POA: Diagnosis not present

## 2017-12-12 MED ORDER — BACLOFEN 10 MG PO TABS: 20.0000 mg | ORAL_TABLET | Freq: Three times a day (TID) | ORAL | 3 refills | Status: DC

## 2017-12-12 NOTE — Patient Instructions (Addendum)
Start Baclofen 10 mg tablets    Morning       Afternoon        Evening  Day 1-5 1 tab               1 tab               2 tab              Day 6-10 2 tab             1 tab               2 tab              Day 11-15 2 tab             2 tab               2 tab              We will send a referral for home occupational therapy  Return to clinic on March 7th at [facility] for Botox  Return to follow-up visit on April 26th at 3:30pm

## 2017-12-12 NOTE — Progress Notes (Signed)
Follow-up Visit   Date: 12/12/17    MARCELLES CLINARD MRN: [id number] DOB: Oct 19, 1961   Interim History: [name] is a 57 y.o. right-handed Caucasian male with history of R ischemic stroke due to R ICA thrombus (2012, not deemed candidate for anticoagulation), subdural hematoma (01/2017), depression, COPD, tobacco abuse, and alcohol abuse (sober since summer 2018) returning to the clinic for follow-up of left arm spasticity.  The patient was accompanied to the clinic by daughter who also provides collateral information.    History of present illness: Patient had stroke in 2012 manifesting with left arm and leg weakness/numbness due to R ICA thrombus.  Because of medication noncompliance, he was managed with aspirin 81mg  daily, which he took for sometime and then discontinued.  He has residual left sided weakness, especially with the left arm which essentially nonfunctional.  He is able to raise the left arm, but unable to grasp objects due to the finger flexion contracture.  He is here for management options.    In April 2018, he suffered a fall complicated by subdural hematoma for which he was transferred to [facility] and managed conservatively.  He was drinking fifth whiskey nightly after his stroke as a means to forget about his disabilty.  He continued to drink like this for many years and has been sober in 2018.  UPDATE 12/12/2017:   He takes baclofen 10mg  9am, noon, and at bedtime but has not noticed any change and is tolerating it well.  He has tried is own home exercises, but this has not made any difference.  He is interested in Botox injections. Home PT/OT has made multiple attempts to contact patient, but he states that he did not receive any calls and is interested in doing this.  He has not cut back on his smoking and continues to smoke 1ppd.   Medications:  Current Outpatient Medications on File Prior to Visit  Medication Sig Dispense Refill  . amLODipine (NORVASC) 10 MG tablet Take  1 tablet (10 mg total) by mouth daily. 90 tablet 1  . DULoxetine (CYMBALTA) 60 MG capsule Take 1 capsule (60 mg total) daily by mouth. 90 capsule 1  . irbesartan (AVAPRO) 300 MG tablet 1/2 tab po qd 90 tablet 1  . metoprolol succinate (TOPROL-XL) 50 MG 24 hr tablet Take 1 tablet (50 mg total) by mouth daily. Take with or immediately following a meal. 90 tablet 1  . metroNIDAZOLE (METROGEL) 0.75 % gel Apply to facial rash twice daily 45 g 6  . QUEtiapine (SEROQUEL) 400 MG tablet Take 1 tablet (400 mg total) by mouth at bedtime. 90 tablet 0  . traZODone (DESYREL) 50 MG tablet TAKE 1 TO 2 TABLETS AT BEDTIME AS NEEDED FOR INSOMNIA 180 tablet 0   No current facility-administered medications on file prior to visit.     Allergies:  Allergies  Allergen Reactions  . Ambien [Zolpidem] Other (See Comments)    Pt took too much, acted out a bad dream/was throwing things, etc--ED visit (07/2017)    Review of Systems:  CONSTITUTIONAL: No fevers, chills, night sweats, or weight loss.  EYES: No visual changes or eye pain ENT: No hearing changes.  No history of nose bleeds.   RESPIRATORY: No cough, wheezing and shortness of breath.   CARDIOVASCULAR: Negative for chest pain, and palpitations.   GI: Negative for abdominal discomfort, blood in stools or black stools.  No recent change in bowel habits.   GU:  No history of incontinence.  MUSCLOSKELETAL: No history of joint pain or swelling.  No myalgias.   SKIN: Negative for lesions, rash, and itching.   ENDOCRINE: Negative for cold or heat intolerance, polydipsia or goiter.   PSYCH:  No depression or anxiety symptoms.   NEURO: As Above.   Vital Signs:  BP 140/88   Pulse 62   Resp 12   SpO2 97%    General: Well appearing, strong tobacco odor  Neurological Exam: MENTAL STATUS including orientation to time, place, person, recent and remote memory, attention span and concentration, language, and fund of knowledge is normal.  Speech is midly  dysarthric.  CRANIAL NERVES:  Face is symmetric.   MOTOR:  Marked spasticity of the left arm - biceps, BR, FDP, OP, and PT.  No atrophy, fasciculations or abnormal movements.  No pronator drift.   Right Upper Extremity:    Left Upper Extremity:    Deltoid  5/5   Deltoid  5/5   Biceps  5/5   Biceps  5/5   Triceps  5/5   Triceps  5/5   Wrist extensors  5/5   Wrist extensors  5/5   Wrist flexors  5/5   Wrist flexors  4/5   Finger extensors  5/5   Finger extensors  4/5   Finger flexors  5/5   Finger flexors  4/5   Dorsal interossei  5/5   Dorsal interossei  4/5   Abductor pollicis  5/5   Abductor pollicis  4/5   Tone (Ashworth scale)  0  Tone (Ashworth scale)  1+   Right Lower Extremity:    Left Lower Extremity:    Hip flexors  5/5   Hip flexors  5/5   Hip extensors  5/5   Hip extensors  5/5   Knee flexors  5/5   Knee flexors  5/5   Knee extensors  5/5   Knee extensors  5/5   Dorsiflexors  5/5   Dorsiflexors  5/5   Plantarflexors  5/5   Plantarflexors  5/5   Toe extensors  5/5   Toe extensors  5/5   Toe flexors  5/5   Toe flexors  5/5   Tone (Ashworth scale)  0  Tone (Ashworth scale)  0   COORDINATION/GAIT:    Gait narrow based and stable, left arm is held flexed with fist against him.  Data: MRI/A brain 02/07/2011: Small areas of acute infarct in the high right frontal lobe and also in the right parietal lobe.  Chronic hemorrhagic infarctions bilaterally.  CT head wo contrast 01/28/2017:  Subdural hematoma on the right with what appears to be subacute hemorrhage. There is moderate mass effect on the right frontal, temporal, and parietal lobes with a maximum thickness of this subdural hematoma of 12 mm. No midline shift. No other extra-axial fluid. No acute intra-axial hemorrhage evident.  IMPRESSION/PLAN: 1.  Left arm spasticity due to old R MCA ischemic stroke.  He has difficulty with personal hygiene and will plan to start Botox.  In the meantime, increase baclofen to 20mg  TID  with slow titration schedule which was given.  Start home occupational (spasticity) and physical therapy (balance).  Botox side effects and benefits discussed at length and patient would like to proceed with this.    2.  Tobacco history of stroke.  He is smoking 1 packs/day.  Patient was informed of the dangers of tobacco abuse including stroke, cancer, and MI, as well as benefits of tobacco cessation.  He expresses interest in  trying to quit and has not tolerated Chantix in the past.  Approximately 5 mins were spent counseling patient cessation techniques. We discussed various methods to help quit smoking, including deciding on a date to quit, joining a support group, pharmacological agents- nicotine gum/patch/lozenges.  I will reassess his progress at his next follow-up visit  He will return botox injection in 3 weeks and follow-up visit to assess improvement in 2 months  The duration of this appointment visit was 25 minutes of face-to-face time with the patient.  Greater than 50% of this time was spent in counseling, explanation of diagnosis, planning of further management, and coordination of care.   Thank you for allowing me to participate in patient's care.  If I can answer any additional questions, I would be pleased to do so.    Sincerely,    Jaramiah Bossard K. Posey Pronto, DO

## 2017-12-12 NOTE — Addendum Note (Signed)
Addended by: Chester Holstein on: 12/12/2017 01:27 PM   Modules accepted: Orders

## 2017-12-15 ENCOUNTER — Other Ambulatory Visit: Payer: Self-pay | Admitting: Family Medicine

## 2017-12-21 ENCOUNTER — Encounter: Payer: Self-pay | Admitting: Emergency Medicine

## 2017-12-21 ENCOUNTER — Emergency Department: Payer: Medicare Other

## 2017-12-21 ENCOUNTER — Emergency Department
Admission: EM | Admit: 2017-12-21 | Discharge: 2017-12-21 | Disposition: A | Discharge status: Other Acute Inpatient Hospital | Payer: Medicare Other | Attending: Emergency Medicine | Admitting: Emergency Medicine

## 2017-12-21 DIAGNOSIS — Z8673 Personal history of transient ischemic attack (TIA), and cerebral infarction without residual deficits: Secondary | ICD-10-CM | POA: Diagnosis not present

## 2017-12-21 DIAGNOSIS — S065XAA Traumatic subdural hemorrhage with loss of consciousness status unknown, initial encounter: Secondary | ICD-10-CM

## 2017-12-21 DIAGNOSIS — S065X9A Traumatic subdural hemorrhage with loss of consciousness of unspecified duration, initial encounter: Secondary | ICD-10-CM

## 2017-12-21 DIAGNOSIS — I10 Essential (primary) hypertension: Secondary | ICD-10-CM | POA: Insufficient documentation

## 2017-12-21 DIAGNOSIS — I6200 Nontraumatic subdural hemorrhage, unspecified: Secondary | ICD-10-CM | POA: Diagnosis not present

## 2017-12-21 DIAGNOSIS — R200 Anesthesia of skin: Secondary | ICD-10-CM | POA: Diagnosis present

## 2017-12-21 DIAGNOSIS — F17210 Nicotine dependence, cigarettes, uncomplicated: Secondary | ICD-10-CM | POA: Diagnosis not present

## 2017-12-21 DIAGNOSIS — Z79899 Other long term (current) drug therapy: Secondary | ICD-10-CM | POA: Insufficient documentation

## 2017-12-21 DIAGNOSIS — I6201 Nontraumatic acute subdural hemorrhage: Secondary | ICD-10-CM | POA: Diagnosis not present

## 2017-12-21 DIAGNOSIS — S065X0A Traumatic subdural hemorrhage without loss of consciousness, initial encounter: Secondary | ICD-10-CM | POA: Diagnosis not present

## 2017-12-21 LAB — COMPREHENSIVE METABOLIC PANEL
ALBUMIN: 4.5 g/dL (ref 3.5–5.0)
ALK PHOS: 105 U/L (ref 38–126)
ALT: 14 U/L — AB (ref 17–63)
AST: 21 U/L (ref 15–41)
Anion gap: 10 (ref 5–15)
BUN: 17 mg/dL (ref 6–20)
CHLORIDE: 101 mmol/L (ref 101–111)
CO2: 27 mmol/L (ref 22–32)
CREATININE: 0.97 mg/dL (ref 0.61–1.24)
Calcium: 9.6 mg/dL (ref 8.9–10.3)
GFR calc non Af Amer: >60 mL/min (ref >60)
GLUCOSE: 113 mg/dL — AB (ref 65–99)
Potassium: 3.4 mmol/L — ABNORMAL LOW (ref 3.5–5.1)
SODIUM: 138 mmol/L (ref 135–145)
Total Bilirubin: 0.6 mg/dL (ref 0.3–1.2)
Total Protein: 8.5 g/dL — ABNORMAL HIGH (ref 6.5–8.1)

## 2017-12-21 LAB — PROTIME-INR
INR: 0.83
PROTHROMBIN TIME: 11.3 s — AB (ref 11.4–15.2)

## 2017-12-21 LAB — DIFFERENTIAL
BASOS ABS: 0.1 10*3/uL (ref 0–0.1)
BASOS PCT: 0 %
Eosinophils Absolute: 0.2 10*3/uL (ref 0–0.7)
Eosinophils Relative: 1 %
LYMPHS PCT: 5 %
Lymphs Abs: 0.7 10*3/uL — ABNORMAL LOW (ref 1.0–3.6)
MONO ABS: 0.7 10*3/uL (ref 0.2–1.0)
Monocytes Relative: 5 %
NEUTROS ABS: 13.1 10*3/uL — AB (ref 1.4–6.5)
NEUTROS PCT: 89 %

## 2017-12-21 LAB — CBC
HCT: 46.7 % (ref 40.0–52.0)
Hemoglobin: 15.5 g/dL (ref 13.0–18.0)
MCH: 31.3 pg (ref 26.0–34.0)
MCHC: 33.2 g/dL (ref 32.0–36.0)
MCV: 94.5 fL (ref 80.0–100.0)
Platelets: 339 10*3/uL (ref 150–440)
RBC: 4.94 MIL/uL (ref 4.40–5.90)
RDW: 14.9 % — AB (ref 11.5–14.5)
WBC: 14.8 10*3/uL — ABNORMAL HIGH (ref 3.8–10.6)

## 2017-12-21 LAB — TROPONIN I: Troponin I: <0.03 ng/mL (ref <0.03)

## 2017-12-21 LAB — APTT: APTT: 32 s (ref 24–36)

## 2017-12-21 LAB — GLUCOSE, CAPILLARY: Glucose-Capillary: 126 mg/dL — ABNORMAL HIGH (ref 65–99)

## 2017-12-21 MED ORDER — SODIUM CHLORIDE 0.9 % IV SOLN
1000.0000 mg | Freq: Once | INTRAVENOUS | Status: AC
  Administered 2017-12-21: 1000 mg via INTRAVENOUS
  Filled 2017-12-21: qty 10

## 2017-12-21 NOTE — ED Notes (Signed)
Pt placed in C-collar per MD Quale request

## 2017-12-21 NOTE — ED Notes (Signed)
Per pt and pt's sons patient had previous stroke that caused left-sided weakness in arm and fingers.

## 2017-12-21 NOTE — ED Notes (Signed)
[facility] arrived to pick up patient for transport to [facility]

## 2017-12-21 NOTE — ED Triage Notes (Signed)
Patient with complaint of numbness to face and left leg. Patient reports bp at 150/100. Patient with a history of stroke and brain bleed.

## 2017-12-21 NOTE — ED Notes (Signed)
Keppra has not arrived from pharmacy

## 2017-12-21 NOTE — ED Provider Notes (Signed)
[facility] Emergency Department Provider Note   ____________________________________________   First MD Initiated Contact with Patient 12/21/17 2204     (approximate)  I have reviewed the triage vital signs and the nursing notes.   HISTORY  Chief Complaint Code Stroke  EM caveat: Some limitation due to acuity of condition with reported acute neurologic deficits  HPI [name] is a 57 y.o. male prior history of ischemic stroke, alcoholism, subdural hematoma hypertension, COPD.  Patient reports that about 1 hour before he got to the ER he noticed suddenly that he felt like he was numb and tingly over the left arm and left lower leg, and also felt like his coordination and weakness on the left side was worse than it had been previously.  Normally he can walk well, but reports today that he felt like he could not walk well about an hour ago.  Reports it feels like it did when "had a stroke"  Reports he does take blood pressure medication, but is not currently taking any other blood thinners.  Denies that he takes aspirin, reports he used to but they stopped that a while ago.  His son reports that they are not quite certain of the medications he is on, but they will bring them in from the vehicle.  He is not entirely compliant in the past with his medications.  Sons do report that he does seem to be increasingly weak on the left arm and left leg as compared to normal.  Patient does report that he falls occasionally, and did fall last night.  The patient follows with neurology at [facility].  No fevers or chills.  No nausea vomiting.  No chest pain or trouble breathing.  We denies headache or neck pain.   Past Medical History:  Diagnosis Date  . Alcoholism ([place])    Continues to abuse ETOH as of 01/2017; pt has poor insight into his problem.  Pt states that as of 04/2017 he has been 3 monthds sober.  . Anxiety and depression   . Arthritis   . Chronic joint  pain   . COPD (chronic obstructive pulmonary disease) ([place])   . Depression   . Frequent headaches   . History of kidney stones   . Hypertension   . Insomnia 08/26/2017   08/23/17 ED visit for adverse rxn to taking ambien, PLUS + marijuana on UDS her and in ED---NO FURTHER AMBIEN OR ANY OTHER CONTROLLED SUBSTANCES WILL BE PRESCRIBED BY ME--PM  . Pneumothorax, right   . Polysubstance abuse ([place])   . Poor historian    UNRELIABLE HISTORIAN  . Stroke ([place]) 2012   LUE residual deficit: flexion contracture and weakness of L hand.  Old bilateral cerebral infarcts involving the right frontal and parietal lobes as well as the left occipital lobe  . Subdural hematoma ([place]) 01/29/2017   Subacute right-sided frontal subdural hematoma: found on CT in [place] ED, transferred to [facility] where this was non-operatively managed.  Pt admitted to the MDs at [facility] that he still drinks lots of whisky daily and they surmised that his frequent falls of late are due to this and frequent use of OTC sleep aids.  . Venous insufficiency of both lower extremities     Patient Active Problem List   Diagnosis Date Noted  . Depression   . TOBACCO ABUSE 11/08/2008  . C O P D 11/08/2008  . Pneumothorax 10/19/2008  . PNEUMOTHORAX 10/19/2008    Past Surgical History:  Procedure Laterality Date  . CHEST TUBE INSERTION    . LUNG SURGERY    . SHOULDER SURGERY     Multiple rotator cuff surgeries on both sides.  Marland Kitchen VIDEO ASSISTED THORACOSCOPY (VATS) W/TALC PLEUADESIS  2010    Prior to Admission medications   Medication Sig Start Date End Date Taking? Authorizing Provider  amLODipine (NORVASC) 10 MG tablet Take 1 tablet (10 mg total) by mouth daily. 09/22/17  Yes McGowen, Adrian Blackwater, MD  baclofen (LIORESAL) 10 MG tablet Take 2 tablets (20 mg total) by mouth 3 (three) times daily. 12/12/17  Yes Patel, Donika K, DO  DULoxetine (CYMBALTA) 60 MG capsule Take 1 capsule (60 mg total) daily by mouth. 09/04/17  Yes McGowen, Adrian Blackwater, MD    irbesartan (AVAPRO) 300 MG tablet 1/2 tab po qd Patient taking differently: Take 300 mg by mouth daily.  02/13/17  Yes McGowen, Adrian Blackwater, MD  metoprolol succinate (TOPROL-XL) 50 MG 24 hr tablet Take 1 tablet (50 mg total) by mouth daily. Take with or immediately following a meal. 07/14/17  Yes McGowen, Adrian Blackwater, MD  metroNIDAZOLE (METROGEL) 0.75 % gel Apply to facial rash twice daily 08/22/17  Yes McGowen, Adrian Blackwater, MD  QUEtiapine (SEROQUEL) 400 MG tablet TAKE 1 TABLET (400 MG TOTAL) BY MOUTH AT BEDTIME. 12/15/17  Yes McGowen, Adrian Blackwater, MD  traZODone (DESYREL) 50 MG tablet TAKE 1 TO 2 TABLETS AT BEDTIME AS NEEDED FOR INSOMNIA 10/17/17  Yes McGowen, Adrian Blackwater, MD     Allergies Ambien [zolpidem]  Family History  Problem Relation Age of Onset  . Arthritis Mother   . Heart disease Father   . Hypertension Father   . Breast cancer Maternal Aunt   . Alcohol abuse Maternal Uncle   . Lung cancer Maternal Aunt   . Cancer Brother        bone    Social History Social History   Tobacco Use  . Smoking status: Current Every Day Smoker    Packs/day: 1.00    Years: 43.00    Pack years: 43.00    Types: Cigarettes  . Smokeless tobacco: Never Used  Substance Use Topics  . Alcohol use: No    Comment: Quit summer 2018.   . Drug use: Yes    Frequency: 2.0 times per week    Types: Marijuana    Comment: 2 joints/week  Reports he used to drink alcohol, has quit for about a year  Review of Systems EM caveat: Some limitation due to this acuity of condition Constitutional: No recent illness.  Followed up about a week ago with neurology at [place]: No visual changes. ENT: No neck pain. Cardiovascular: Denies chest pain. Respiratory: Denies shortness of breath. Gastrointestinal: No nausea, no vomiting. Musculoskeletal: Negative for back pain.  Denies neck pain.  See HPI regarding weakness on the left which the patient reports some slight weakness chronically in the left hand and slightly in  the left leg, but reports it is notably worse in the last 1 hour.  The patient's 2 sons at the bedside also corroborate that he seems to be notably more weak on the left Skin: Negative for rash. Neurological: Negative for headaches but see HPI    ____________________________________________   PHYSICAL EXAM:  VITAL SIGNS: ED Triage Vitals [12/21/17 2203]  Enc Vitals Group     BP (!) 156/86     Pulse Rate 73     Resp 14     Temp 98.3 F (36.8 C)  Temp src      SpO2 100 %     Weight 170 lb (77.1 kg)     Height 5\' 7"  (1.702 m)     Head Circumference      Peak Flow      Pain Score      Pain Loc      Pain Edu?      Excl. in [place]?     Constitutional: Alert and oriented.   Alert and conversant.  Very pleasant and amicable. Eyes: Conjunctivae are normal. Head: Atraumatic. Nose: No congestion/rhinnorhea. Mouth/Throat: Mucous membranes are moist. Neck: No stridor.  No cervical spine tenderness. Cardiovascular: Normal rate, regular rhythm. Grossly normal heart sounds.  Good peripheral circulation. Respiratory: Normal respiratory effort.  No retractions. Lungs CTAB. Gastrointestinal: Soft and nontender. No distention. Musculoskeletal: No lower extremity tenderness nor edema. Neurologic:  Normal speech and language.  NIH score equals 5, performed by me at bedside. The patient has mild left arm and leg drift. The patient has normal cranial nerve exam. Extraocular movements are normal. Visual fields are normal. Patient has 5 out of 5 strength in all extremities but some slight weakness in the left arm and left leg. There is no numbness or gross, acute sensory abnormality in the extremities bilaterally except for a slight decrease in sensation left arm and left leg. No speech disturbance. No dysarthria. No aphasia. There is mild ataxia in the left arm and left leg. Patient speaking in full and clear sentences.   Skin:  Skin is warm, dry and intact. No rash noted. Psychiatric:  Mood and affect are normal. Speech and behavior are normal.  ____________________________________________   LABS (all labs ordered are listed, but only abnormal results are displayed)  Labs Reviewed  PROTIME-INR - Abnormal; Notable for the following components:      Result Value   Prothrombin Time 11.3 (*)    All other components within normal limits  CBC - Abnormal; Notable for the following components:   WBC 14.8 (*)    RDW 14.9 (*)    All other components within normal limits  DIFFERENTIAL - Abnormal; Notable for the following components:   Neutro Abs 13.1 (*)    Lymphs Abs 0.7 (*)    All other components within normal limits  COMPREHENSIVE METABOLIC PANEL - Abnormal; Notable for the following components:   Potassium 3.4 (*)    Glucose, Bld 113 (*)    Total Protein 8.5 (*)    ALT 14 (*)    All other components within normal limits  GLUCOSE, CAPILLARY - Abnormal; Notable for the following components:   Glucose-Capillary 126 (*)    All other components within normal limits  APTT  TROPONIN I  CBG MONITORING, ED   ____________________________________________  EKG  Reviewed and to by me at 2230 Heart rate 70 QRS 100 QTC 440 Some slight tremor, no evidence of acute ischemia denoted. ____________________________________________  RADIOLOGY    Personally reviewed and discussed with the radiologist CT scan findings.  Concern for an acute right-sided subdural hematoma with regional mass-effect.  There is no midline shift. ____________________________________________   PROCEDURES  Procedure(s) performed: None  Procedures  Critical Care performed: Yes, see critical care note(s)  CRITICAL CARE Performed by: Delman Kitten   Total critical care time: 40 minutes  Critical care time was exclusive of separately billable procedures and treating other patients.  Critical care was necessary to treat or prevent imminent or life-threatening deterioration.  Critical care  was time spent personally  by me on the following activities: development of treatment plan with patient and/or surrogate as well as nursing, discussions with consultants, evaluation of patient's response to treatment, examination of patient, obtaining history from patient or surrogate, ordering and performing treatments and interventions, ordering and review of laboratory studies, ordering and review of radiographic studies, pulse oximetry and re-evaluation of patient's condition.  Patient seen and evaluated as a "code stroke" requiring emergent evaluation to evaluate for concerns of a possible new acute neurologic insult. ____________________________________________   INITIAL IMPRESSION / ASSESSMENT AND PLAN / ED COURSE  Pertinent labs & imaging results that were available during my care of the patient were reviewed by me and considered in my medical decision making (see chart for details).  Patient presents for acute left-sided weakness.  He has some weakness from previous stroke over the left but reports in the last hour he has had a noticeable with difficulty walking, difficulty raising his left arm, difficulty raising his left leg.  No associated cardiac or pulmonary symptoms.  Does report a fall at a bed for some type of fall occurring recently.  He is a slightly poor historian, but his sons to help fill in the history.  High concern for acute neurologic etiology, he does have hard findings of weakness ataxia pronator drift on the left arm and left leg.  Clinical Course as of Dec 22 13  Sun Dec 21, 2017  2220 Initially paged [facility] for emergent transfer for neurosurgical evaluation.  Discussed with patient and his son, he reports that he lives in [place] and has been seen at [facility] before and would like to be transferred to [facility] preferentially.  I have paged neurosurgery via [facility] at [facility] to discuss and request emergency transfer for subdural hematoma with associated  neurologic symptoms.  [MQ]  2247 Patient's family has brought his medications, no blood thinners or antiplatelet agents.  Patient and family again denies that he takes any aspirin or other blood thinners.  Patient has been accepted to [facility] by Dr. Ashok Pall of neurosurgery to the neurosurgical ICU.  [MQ]  2259 Patient reevaluated, resting with family at bedside.  Cervical collar applied and patient tolerating well.  Applied given the patient's report of a fall, denies cervical tenderness and denies neck pains I feel low risk for cervical injury and likely his symptoms are explained by his subdural hematoma.  However, at this point awaiting emergency transfer to [facility] neuro ICU, cannot clinically clear his cervical spine at this time given his fall and neurologic deficits over the left side.  [MQ]  2308 Discussed with neurology, neurologist agrees with transfer for neuro ICU with neurosurgery.  Also recommends loading with IV Keppra 1 g based on current literature for cortical subdurals.  I have ordered this per neurologist recommendation.  Patient takes no anticoagulants, no antiplatelet agents, INR is less than 1.  No evidence for reversal noted at this time.  [MQ]  2337 Patient reevaluation completed, patient resting comfortably.  [facility] staff at the bedside.  Personally gave report to [facility] paramedic, Shanon Brow.  Patient will remains awake and alert, stable examination, appears stable for transport for neurosurgical evaluation.  [MQ]    Clinical Course User Index [MQ] Delman Kitten, MD   ----------------------------------------- 10:28 PM on 12/21/2017 -----------------------------------------  Patient noted to have acute subdural on CT scan.  He reports not taking any anticoagulation, a little unsure of his medications but denies taking aspirin recently for the last couple of  months.  Await coags.  Emergency transfer to [facility] where neurosurgery is available has been requested at this  time. Await call back from neurosurgery at this time.   ----------------------------------------- 10:45 PM on 12/21/2017 -----------------------------------------    ____________________________________________   FINAL CLINICAL IMPRESSION(S) / ED DIAGNOSES  Final diagnoses:  Acute subdural hematoma ([place])      NEW MEDICATIONS STARTED DURING THIS VISIT:  Discharge Medication List as of 12/21/2017 11:50 PM       Note:  This document was prepared using Dragon voice recognition software and may include unintentional dictation errors.     Delman Kitten, MD 12/22/17 [phone]

## 2017-12-21 NOTE — ED Notes (Signed)
Per pt's son pt was sleeping last night and fell off the bed

## 2017-12-21 NOTE — Consult Note (Signed)
   [facility] Consult Services  Impression: Acute SDH Correlates to the new symptoms of left sided numbness. Not a tpa candidate due to: [place] Not an NIR candidate due to: ICH  Comments:   [facility] contacted: 2219 [facility] at bedside: 2223 NIHSS assessment time: 2223  Recommendations:   Hold all antithrombotics SBP goal of less than 140 Would load with keppra given extensive cortical blood.  Further inpatient evaluation as per Neurosurgery.  Discussed with ED MD  History of Present Illness   Patient is a 57 yo M with h/o prior stroke and baseline left sided weakness and prior subdural hematoma last year in April .  P/w fall out of bed this monrning and now new left sided tingling. Tingling started about 1 hour prior to arrival to the ED.  HCT is showing now acute right sided SDH He is not on any antithrombotics at this point.      Exam   NIHSS score:4 1A: Level of Consciousness - Alert; keenly responsive  1B: Ask Month and Age - Both Questions Right  1C: 'Blink Eyes' & 'Squeeze Hands' - Performs Both Tasks  2: Test Horizontal Extraocular Movements - Normal  3: Test Visual Fields - No Visual Loss  4: Test Facial Palsy - Minor paralysis (flat nasolabial fold, smile asymetry) 5A: Test Left Arm Motor Drift - Drift, but doesn't hit bed  5B: Test Right Arm Motor Drift - No Drift for 10 Seconds 6A: Test Left Leg Motor Drift - No Drift for 5 Seconds  6B: Test Right Leg Motor Drift - No Drift for 5 Seconds  7: Test Limb Ataxia - No Ataxia  8: Test Sensation - Mild-Moderate Loss: Less Sharp/More Dull  9: Test Language/Aphasia - Normal; No aphasia  10: Test Dysarthria - Mild-Moderate Dysarthria: Slurring but can be understood  11: Test Extinction/Inattention - No abnormality   Medical Decision Making:  - Extensive number of diagnosis or management options are considered above.   - Extensive amount of complex data reviewed.   - High risk of  complication and/or morbidity or mortality are associated with differential diagnostic considerations above.  - There may be Uncertain outcome and increased probability of prolonged functional impairment or high probability of severe prolonged functional impairment associated with some of these differential diagnosis.  Medical Data Reviewed:  1.Data reviewed include clinical labs, radiology,  Medical Tests;   2.Tests results discussed w/performing or interpreting physician;   3.Obtaining/reviewing old medical records;  4.Obtaining case history from another source;  5.Independent review of image, tracing or specimen.

## 2017-12-22 ENCOUNTER — Inpatient Hospital Stay (HOSPITAL_COMMUNITY)
Admission: AD | Admit: 2017-12-22 | Discharge: 2017-12-24 | DRG: 084 | Disposition: A | Discharge status: Home Health | Payer: Medicare Other | Source: Other Acute Inpatient Hospital | Attending: Neurosurgery | Admitting: Neurosurgery

## 2017-12-22 DIAGNOSIS — S065X9A Traumatic subdural hemorrhage with loss of consciousness of unspecified duration, initial encounter: Secondary | ICD-10-CM | POA: Diagnosis present

## 2017-12-22 DIAGNOSIS — Z23 Encounter for immunization: Secondary | ICD-10-CM

## 2017-12-22 DIAGNOSIS — F1010 Alcohol abuse, uncomplicated: Secondary | ICD-10-CM | POA: Diagnosis present

## 2017-12-22 DIAGNOSIS — W06XXXA Fall from bed, initial encounter: Secondary | ICD-10-CM | POA: Diagnosis present

## 2017-12-22 DIAGNOSIS — F329 Major depressive disorder, single episode, unspecified: Secondary | ICD-10-CM | POA: Diagnosis present

## 2017-12-22 DIAGNOSIS — S065XAA Traumatic subdural hemorrhage with loss of consciousness status unknown, initial encounter: Secondary | ICD-10-CM | POA: Diagnosis present

## 2017-12-22 DIAGNOSIS — I10 Essential (primary) hypertension: Secondary | ICD-10-CM | POA: Diagnosis present

## 2017-12-22 DIAGNOSIS — F419 Anxiety disorder, unspecified: Secondary | ICD-10-CM | POA: Diagnosis present

## 2017-12-22 DIAGNOSIS — S065X0A Traumatic subdural hemorrhage without loss of consciousness, initial encounter: Secondary | ICD-10-CM | POA: Diagnosis not present

## 2017-12-22 DIAGNOSIS — F17210 Nicotine dependence, cigarettes, uncomplicated: Secondary | ICD-10-CM | POA: Diagnosis present

## 2017-12-22 LAB — MRSA PCR SCREENING: MRSA BY PCR: POSITIVE — AB

## 2017-12-22 MED ORDER — METOPROLOL SUCCINATE ER 25 MG PO TB24
50.0000 mg | ORAL_TABLET | Freq: Every day | ORAL | Status: DC
  Administered 2017-12-23 – 2017-12-24 (×2): 50 mg via ORAL
  Filled 2017-12-22: qty 2
  Filled 2017-12-22: qty 1

## 2017-12-22 MED ORDER — BACLOFEN 10 MG PO TABS
20.0000 mg | ORAL_TABLET | Freq: Three times a day (TID) | ORAL | Status: DC
  Administered 2017-12-22 – 2017-12-24 (×8): 20 mg via ORAL
  Filled 2017-12-22 (×8): qty 2

## 2017-12-22 MED ORDER — QUETIAPINE FUMARATE 50 MG PO TABS
400.0000 mg | ORAL_TABLET | Freq: Every day | ORAL | Status: DC
  Administered 2017-12-22 – 2017-12-23 (×3): 400 mg via ORAL
  Filled 2017-12-22 (×2): qty 2
  Filled 2017-12-22: qty 8

## 2017-12-22 MED ORDER — METRONIDAZOLE 0.75 % EX GEL: Freq: Two times a day (BID) | CUTANEOUS | Status: DC

## 2017-12-22 MED ORDER — IRBESARTAN 300 MG PO TABS
300.0000 mg | ORAL_TABLET | Freq: Every day | ORAL | Status: DC
  Administered 2017-12-23 – 2017-12-24 (×2): 300 mg via ORAL
  Filled 2017-12-22: qty 1
  Filled 2017-12-22: qty 2

## 2017-12-22 MED ORDER — ACETAMINOPHEN 325 MG PO TABS
650.0000 mg | ORAL_TABLET | ORAL | Status: DC | PRN
  Administered 2017-12-23 (×2): 650 mg via ORAL
  Filled 2017-12-22 (×2): qty 2

## 2017-12-22 MED ORDER — MUPIROCIN 2 % EX OINT
1.0000 "application " | TOPICAL_OINTMENT | Freq: Two times a day (BID) | CUTANEOUS | Status: DC
  Administered 2017-12-22 – 2017-12-24 (×6): 1 via NASAL
  Filled 2017-12-22: qty 22

## 2017-12-22 MED ORDER — SODIUM CHLORIDE 0.9 % IV SOLN
INTRAVENOUS | Status: DC
  Administered 2017-12-22 (×3): via INTRAVENOUS

## 2017-12-22 MED ORDER — DULOXETINE HCL 60 MG PO CPEP
60.0000 mg | ORAL_CAPSULE | Freq: Every day | ORAL | Status: DC
  Administered 2017-12-22 – 2017-12-24 (×3): 60 mg via ORAL
  Filled 2017-12-22 (×3): qty 1

## 2017-12-22 MED ORDER — AMLODIPINE BESYLATE 10 MG PO TABS
10.0000 mg | ORAL_TABLET | Freq: Every day | ORAL | Status: DC
  Administered 2017-12-23 – 2017-12-24 (×2): 10 mg via ORAL
  Filled 2017-12-22 (×2): qty 1

## 2017-12-22 MED ORDER — SODIUM CHLORIDE 0.9 % IV SOLN: INTRAVENOUS | Status: DC

## 2017-12-22 MED ORDER — CHLORHEXIDINE GLUCONATE CLOTH 2 % EX PADS: 6.0000 | MEDICATED_PAD | Freq: Every day | CUTANEOUS | Status: DC

## 2017-12-22 MED ORDER — TRAMADOL HCL 50 MG PO TABS
50.0000 mg | ORAL_TABLET | Freq: Four times a day (QID) | ORAL | Status: DC | PRN
  Administered 2017-12-22 – 2017-12-24 (×5): 50 mg via ORAL
  Filled 2017-12-22 (×5): qty 1

## 2017-12-22 MED ORDER — CHLORHEXIDINE GLUCONATE CLOTH 2 % EX PADS
6.0000 | MEDICATED_PAD | Freq: Every day | CUTANEOUS | Status: DC
  Administered 2017-12-23: 6 via TOPICAL

## 2017-12-22 MED ORDER — SODIUM CHLORIDE 0.9 % IV SOLN
INTRAVENOUS | Status: DC
Start: 2017-12-22 — End: 2017-12-22
  Filled 2017-12-22: qty 1000

## 2017-12-22 NOTE — H&P (Signed)
[name] is an 57 y.o. male.   Chief Complaint: Subdural hematoma, right HPI: whom fell out of bed yesterday. Noted tingling in his left upper and lower extremity, though none in his torso.Felt he was weaker over the course of today. Head Ct showed a subdural hematoma without midline shift. Transfer requested for neurosurgical care.  Past Medical History:  Diagnosis Date  . Alcoholism ([place])    Continues to abuse ETOH as of 01/2017; pt has poor insight into his problem.  Pt states that as of 04/2017 he has been 3 monthds sober.  . Anxiety and depression   . Arthritis   . Chronic joint pain   . COPD (chronic obstructive pulmonary disease) ([place])   . Depression   . Frequent headaches   . History of kidney stones   . Hypertension   . Insomnia 08/26/2017   08/23/17 ED visit for adverse rxn to taking ambien, PLUS + marijuana on UDS her and in ED---NO FURTHER AMBIEN OR ANY OTHER CONTROLLED SUBSTANCES WILL BE PRESCRIBED BY ME--PM  . Pneumothorax, right   . Polysubstance abuse ([place])   . Poor historian    UNRELIABLE HISTORIAN  . Stroke ([place]) 2012   LUE residual deficit: flexion contracture and weakness of L hand.  Old bilateral cerebral infarcts involving the right frontal and parietal lobes as well as the left occipital lobe  . Subdural hematoma ([place]) 01/29/2017   Subacute right-sided frontal subdural hematoma: found on CT in [place] ED, transferred to [facility] where this was non-operatively managed.  Pt admitted to the MDs at [facility] that he still drinks lots of whisky daily and they surmised that his frequent falls of late are due to this and frequent use of OTC sleep aids.  . Venous insufficiency of both lower extremities     Past Surgical History:  Procedure Laterality Date  . CHEST TUBE INSERTION    . LUNG SURGERY    . SHOULDER SURGERY     Multiple rotator cuff surgeries on both sides.  Marland Kitchen VIDEO ASSISTED THORACOSCOPY (VATS) W/TALC PLEUADESIS  2010    Family History  Problem Relation Age of  Onset  . Arthritis Mother   . Heart disease Father   . Hypertension Father   . Breast cancer Maternal Aunt   . Alcohol abuse Maternal Uncle   . Lung cancer Maternal Aunt   . Cancer Brother        bone   Social History:  reports that he has been smoking cigarettes.  He has a 43.00 pack-year smoking history. he has never used smokeless tobacco. He reports that he uses drugs. Drug: Marijuana. Frequency: 2.00 times per week. He reports that he does not drink alcohol.  Allergies:  Allergies  Allergen Reactions  . Ambien [Zolpidem] Other (See Comments)    Pt took too much, acted out a bad dream/was throwing things, etc--ED visit (07/2017)    Medications Prior to Admission  Medication Sig Dispense Refill  . amLODipine (NORVASC) 10 MG tablet Take 1 tablet (10 mg total) by mouth daily. 90 tablet 1  . baclofen (LIORESAL) 10 MG tablet Take 2 tablets (20 mg total) by mouth 3 (three) times daily. 810 each 3  . DULoxetine (CYMBALTA) 60 MG capsule Take 1 capsule (60 mg total) daily by mouth. 90 capsule 1  . irbesartan (AVAPRO) 300 MG tablet 1/2 tab po qd (Patient taking differently: Take 300 mg by mouth daily. ) 90 tablet 1  . metoprolol succinate (TOPROL-XL) 50 MG 24 hr  tablet Take 1 tablet (50 mg total) by mouth daily. Take with or immediately following a meal. 90 tablet 1  . metroNIDAZOLE (METROGEL) 0.75 % gel Apply to facial rash twice daily 45 g 6  . QUEtiapine (SEROQUEL) 400 MG tablet TAKE 1 TABLET (400 MG TOTAL) BY MOUTH AT BEDTIME. 90 tablet 1  . traZODone (DESYREL) 50 MG tablet TAKE 1 TO 2 TABLETS AT BEDTIME AS NEEDED FOR INSOMNIA 180 tablet 0    Results for orders placed or performed during the hospital encounter of 12/21/17 (from the past 48 hour(s))  Protime-INR     Status: Abnormal   Collection Time: 12/21/17 10:09 PM  Result Value Ref Range   Prothrombin Time 11.3 (L) 11.4 - 15.2 seconds   INR 0.83     Comment: Performed at [facility], [address] [place],  [place] [address]  APTT     Status: None   Collection Time: 12/21/17 10:09 PM  Result Value Ref Range   aPTT 32 24 - 36 seconds    Comment: Performed at [facility], [address] [place], [place] [address]  CBC     Status: Abnormal   Collection Time: 12/21/17 10:09 PM  Result Value Ref Range   WBC 14.8 (H) 3.8 - 10.6 K/uL   RBC 4.94 4.40 - 5.90 MIL/uL   Hemoglobin 15.5 13.0 - 18.0 g/dL   HCT 46.7 40.0 - 52.0 %   MCV 94.5 80.0 - 100.0 fL   MCH 31.3 26.0 - 34.0 pg   MCHC 33.2 32.0 - 36.0 g/dL   RDW 14.9 (H) 11.5 - 14.5 %   Platelets 339 150 - 440 K/uL    Comment: Performed at [facility], [address] [place], [place] [address]  Differential     Status: Abnormal   Collection Time: 12/21/17 10:09 PM  Result Value Ref Range   Neutrophils Relative % 89 %   Neutro Abs 13.1 (H) 1.4 - 6.5 K/uL   Lymphocytes Relative 5 %   Lymphs Abs 0.7 (L) 1.0 - 3.6 K/uL   Monocytes Relative 5 %   Monocytes Absolute 0.7 0.2 - 1.0 K/uL   Eosinophils Relative 1 %   Eosinophils Absolute 0.2 0 - 0.7 K/uL   Basophils Relative 0 %   Basophils Absolute 0.1 0 - 0.1 K/uL    Comment: Performed at [facility], [address] [place], [place] [address]  Comprehensive metabolic panel     Status: Abnormal   Collection Time: 12/21/17 10:09 PM  Result Value Ref Range   Sodium 138 135 - 145 mmol/L   Potassium 3.4 (L) 3.5 - 5.1 mmol/L   Chloride 101 101 - 111 mmol/L   CO2 27 22 - 32 mmol/L   Glucose, Bld 113 (H) 65 - 99 mg/dL   BUN 17 6 - 20 mg/dL   Creatinine, Ser 0.97 0.61 - 1.24 mg/dL   Calcium 9.6 8.9 - 10.3 mg/dL   Total Protein 8.5 (H) 6.5 - 8.1 g/dL   Albumin 4.5 3.5 - 5.0 g/dL   AST 21 15 - 41 U/L   ALT 14 (L) 17 - 63 U/L   Alkaline Phosphatase 105 38 - 126 U/L   Total Bilirubin 0.6 0.3 - 1.2 mg/dL   GFR calc non Af Amer >60 >60 mL/min   GFR calc Af Amer >60 >60 mL/min    Comment: (NOTE) The eGFR has been calculated using the CKD EPI equation. This calculation has  not been  validated in all clinical situations. eGFR's persistently <60 mL/min signify possible Chronic Kidney Disease.    Anion gap 10 5 - 15    Comment: Performed at [facility], [address] [place], [place] [address]  Troponin I     Status: None   Collection Time: 12/21/17 10:09 PM  Result Value Ref Range   Troponin I <0.03 <0.03 ng/mL    Comment: Performed at [facility], [address] [place], [place] [address]  Glucose, capillary     Status: Abnormal   Collection Time: 12/21/17 10:21 PM  Result Value Ref Range   Glucose-Capillary 126 (H) 65 - 99 mg/dL   Ct Head Code Stroke Wo Contrast  Result Date: 12/21/2017 CLINICAL DATA:  Code stroke. LEFT face and LEFT leg numbness. Hypertensive. History of stroke and subdural hematoma. EXAM: CT HEAD WITHOUT CONTRAST TECHNIQUE: Contiguous axial images were obtained from the base of the skull through the vertex without intravenous contrast. COMPARISON:  CT HEAD January 28, 2017 and August 23, 2017. FINDINGS: Mild motion degraded examination. BRAIN: No intraparenchymal hemorrhage, nor midline shift. No acute large vascular territory infarct. RIGHT frontoparietal encephalomalacia including mesial RIGHT frontal lobe. LEFT mesial parietooccipital lobe encephalomalacia. Mild ex vacuo dilatation RIGHT lateral ventricle. No hydrocephalus. 10 mm intermediate density RIGHT frontoparietal subdural hematoma new from most recent CT. Mild mass effect on subjacent cerebrum. Basal cisterns are patent. VASCULAR: Trace calcific atherosclerosis carotid siphons. SKULL/SOFT TISSUES: No skull fracture. No significant soft tissue swelling. ORBITS/SINUSES: The included ocular globes and orbital contents are normal.The mastoid aircells and included paranasal sinuses are well-aerated. OTHER: None. IMPRESSION: 1. Mild motion degraded examination. 2. Acute 10 mm RIGHT subdural hematoma. Regional mass effect without midline shift. 3. Old RIGHT frontoparietal  (MCA versus watershed territory and, RIGHT ACA) infarcts. Old LEFT parietooccipital lobe (venous versus PCA territory). 4. Critical Value/emergent results were called by telephone at the time of interpretation on 12/21/2017 at 10:15 pm to Dr. Delman Kitten , who verbally acknowledged these results. Electronically Signed   By: Elon Alas M.D.   On: 12/21/2017 22:42    Review of Systems  HENT: Negative.   Eyes: Negative.   Respiratory: Negative.   Cardiovascular: Negative.   Genitourinary: Negative.   Skin: Negative.   Neurological: Positive for focal weakness and weakness.  Endo/Heme/Allergies: Negative.   Psychiatric/Behavioral: Positive for depression and substance abuse.    Blood pressure (!) 156/92, pulse 71, temperature 98.3 F (36.8 C), temperature source Axillary, resp. rate (!) 23, SpO2 96 %. Physical Exam  Constitutional: He is oriented to person, place, and time. He appears well-developed and well-nourished. No distress.  HENT:  Head: Normocephalic and atraumatic.  Eyes: Conjunctivae and EOM are normal. Pupils are equal, round, and reactive to light.  Neck: Normal range of motion. Neck supple.  Cardiovascular: Normal rate, regular rhythm and normal heart sounds.  Respiratory: Effort normal and breath sounds normal.  GI: Soft. Bowel sounds are normal.  Musculoskeletal: Normal range of motion. He exhibits deformity.  Left hand slightly clawed  Neurological: He is alert and oriented to person, place, and time. He has normal reflexes. He exhibits normal muscle tone. Coordination abnormal.  Strength is full on exam. There is no drift Did not have patient walk.  Skin: Skin is warm and dry.     Assessment/Plan Admit overnight for observation. Is doing very well currently. Will repeat head ct tomorrow morning  Victoire Deans L, MD 12/22/2017, 1:20 AM

## 2017-12-22 NOTE — Evaluation (Addendum)
Physical Therapy Evaluation Patient Details Name: [name] MRN: [id number] DOB: 03-08-1961 Today's Date: 12/22/2017   History of Present Illness  57 yo admitted after fall out of bed with Right SDH. PMHx: CVA with residual LUE weakness, ETOH abuse, polysubstance abuse, anxiety, COPD, HTN  Clinical Impression  Pt with decreased safety awareness, balance, gait, function, ataxia and impaired hearing with assist for all mobility gait and function who will benefit from acute therapy to maximize gait, balance, transfers and independence to decrease fall risk and burden of care. Pt lives with son who works and does not have 24hr assist at home with history of frequent falls and pt lacks insight into deficits to the point he threw away his DME. Pt would benefit from SNF and 24hr assist however pt states he will refuse and if he does at the very minimum HHPT with 3in1 for bathing recommended. Will follow acutely with RN aware of assist for all mobility.  Pt reports he has more ataxia and difficulty hearing with BP elevated however BP not elevated at this time.  Before session 128/80 After 116/80    Follow Up Recommendations Supervision/Assistance - 24 hour;SNF    Equipment Recommendations  3in1 (PT)    Recommendations for Other Services OT consult     Precautions / Restrictions Precautions Precautions: Fall Restrictions Weight Bearing Restrictions: No      Mobility  Bed Mobility Overal bed mobility: Needs Assistance Bed Mobility: Supine to Sit     Supine to sit: Min guard     General bed mobility comments: cues for sequence and increased time  Transfers Overall transfer level: Needs assistance   Transfers: Sit to/from Stand Sit to Stand: Min assist         General transfer comment: min assist to stand with initial posterior lean  Ambulation/Gait Ambulation/Gait assistance: Min assist Ambulation Distance (Feet): 200 Feet Assistive device: None Gait Pattern/deviations:  Step-through pattern;Decreased stride length;Drifts right/left;Narrow base of support   Gait velocity interpretation: Below normal speed for age/gender General Gait Details: PT with unsteady gait veering right and left with gait. Min assist for balance and direction. With use of RW pt popping wheels off the ground and more unsafe with use of DME  Stairs            Wheelchair Mobility    Modified Rankin (Stroke Patients Only)       Balance Overall balance assessment: Needs assistance   Sitting balance-Leahy Scale: Fair       Standing balance-Leahy Scale: Fair                               Pertinent Vitals/Pain Pain Assessment: No/denies pain    Home Living Family/patient expects to be discharged to:: Private residence Living Arrangements: Children Available Help at Discharge: Family;Available PRN/intermittently Type of Home: House Home Access: Stairs to enter Entrance Stairs-Rails: None Entrance Stairs-Number of Steps: 4 Home Layout: One level Home Equipment: None Additional Comments: pt had DME but threw it away because he didn't feel he needed it    Prior Function Level of Independence: Needs assistance   Gait / Transfers Assistance Needed: per pt he walks on his own. multiple falls in the last year with assist to get up from son  ADL's / Homemaking Assistance Needed: pt reports he does his own bathing, dressing and cooking and drives sometimes. Pt does endorse multiple falls in the shower  Hand Dominance        Extremity/Trunk Assessment   Upper Extremity Assessment Upper Extremity Assessment: RUE deficits/detail;LUE deficits/detail RUE Deficits / Details: ataxia with activity LUE Deficits / Details: hemiparetic    Lower Extremity Assessment Lower Extremity Assessment: RLE deficits/detail;LLE deficits/detail RLE Deficits / Details: 4/5 strength hip flex, knee flexion and extension LLE Deficits / Details: 5/5 strength     Cervical / Trunk Assessment Cervical / Trunk Assessment: Normal  Communication   Communication: HOH(pt states his hearing feels like he is in a tunnel)  Cognition Arousal/Alertness: Awake/alert Behavior During Therapy: WFL for tasks assessed/performed Overall Cognitive Status: Impaired/Different from baseline Area of Impairment: Safety/judgement;Problem solving                         Safety/Judgement: Decreased awareness of safety;Decreased awareness of deficits   Problem Solving: Slow processing General Comments: pt reports he threw away equipment because he didn't need it. Pt with frequent falls at home and doesn't find that concerning      General Comments      Exercises     Assessment/Plan    PT Assessment Patient needs continued PT services  PT Problem List Decreased mobility;Decreased safety awareness;Decreased coordination;Decreased activity tolerance;Decreased balance;Decreased knowledge of use of DME;Decreased cognition       PT Treatment Interventions Gait training;Patient/family education;Stair training;Balance training;Functional mobility training;Neuromuscular re-education;DME instruction;Therapeutic activities;Cognitive remediation    PT Goals (Current goals can be found in the Care Plan section)  Acute Rehab PT Goals Patient Stated Goal: return home PT Goal Formulation: With patient Time For Goal Achievement: 01/05/18 Potential to Achieve Goals: Fair    Frequency Min 3X/week   Barriers to discharge Decreased caregiver support      Co-evaluation               AM-PAC PT "6 Clicks" Daily Activity  Outcome Measure Difficulty turning over in bed (including adjusting bedclothes, sheets and blankets)?: A Little Difficulty moving from lying on back to sitting on the side of the bed? : A Little Difficulty sitting down on and standing up from a chair with arms (e.g., wheelchair, bedside commode, etc,.)?: A Lot Help needed moving to and from  a bed to chair (including a wheelchair)?: A Little Help needed walking in hospital room?: A Lot Help needed climbing 3-5 steps with a railing? : A Lot 6 Click Score: 15    End of Session Equipment Utilized During Treatment: Gait belt Activity Tolerance: Patient tolerated treatment well Patient left: in chair;with call bell/phone within reach;with chair alarm set Nurse Communication: Mobility status;Precautions PT Visit Diagnosis: Other abnormalities of gait and mobility (R26.89);Other symptoms and signs involving the nervous system (R29.898);Repeated falls (R29.6)    Time: 3474-2595 PT Time Calculation (min) (ACUTE ONLY): 21 min   Charges:   PT Evaluation $PT Eval Moderate Complexity: 1 Mod     PT G Codes:        Elwyn Reach, PT [phone]   [place] S[place] 12/22/2017, 9:56 AM

## 2017-12-22 NOTE — Evaluation (Addendum)
Occupational Therapy Evaluation Patient Details Name: [name] MRN: [id number] DOB: 1961/03/09 Today's Date: 12/22/2017    History of Present Illness 57 yo admitted after fall out of bed with Right SDH. PMHx: CVA with residual LUE weakness, ETOH abuse, polysubstance abuse, anxiety, COPD, HTN   Clinical Impression   PTA, pt reports ambulating without assistance but sustaining multiple falls. Inconsistencies noted in pt's report as initially he reports falling at home and then when questioned reports "well, my son thinks I fall." Pt presenting with poor awareness, attention, and insight into his deficits. He demonstrates decreased coordination in R UE as well as poor visual attention to the L side of environment. He reports blurred vision on the L side as well. Pt requires hands on assistance for all ADL due to coordination and balance deficits and is at great risk of falling and further injury. Pt would benefit from SNF level rehabilitation post-acute D/C to maximize safety and independence with ADL prior to returning home. If unable to D/C to SNF, he will require 24 hour assistance with home health therapies including aide and OT as well as 3-in-1 BSC. OT will continue to follow while admitted.    Follow Up Recommendations  SNF;Supervision/Assistance - 24 hour(if unable to go to SNF will need 24 hour assist and HHOT)    Equipment Recommendations  3 in 1 bedside commode;Tub/shower seat    Recommendations for Other Services       Precautions / Restrictions Precautions Precautions: Fall Restrictions Weight Bearing Restrictions: No      Mobility Bed Mobility Overal bed mobility: Needs Assistance Bed Mobility: Supine to Sit     Supine to sit: Min guard     General bed mobility comments: OOB in recliner on my arrival.   Transfers Overall transfer level: Needs assistance   Transfers: Sit to/from Stand Sit to Stand: Min assist         General transfer comment: Min assist to  power up to standing due to poor balance and coordination.     Balance Overall balance assessment: Needs assistance Sitting-balance support: No upper extremity supported;Feet supported Sitting balance-Leahy Scale: Fair     Standing balance support: During functional activity;No upper extremity supported Standing balance-Leahy Scale: Fair Standing balance comment: statically able to stand without UE support but requires significant assistance to maintain balance during dynamic tasks.                            ADL either performed or assessed with clinical judgement   ADL Overall ADL's : Needs assistance/impaired Eating/Feeding: Set up;Sitting   Grooming: Supervision/safety;Sitting   Upper Body Bathing: Minimal assistance;Sitting   Lower Body Bathing: Minimal assistance;Sit to/from stand   Upper Body Dressing : Minimal assistance;Sitting   Lower Body Dressing: Minimal assistance;Sit to/from stand   Toilet Transfer: Minimal assistance;Ambulation   Toileting- Clothing Manipulation and Hygiene: Minimal assistance;Sit to/from stand       Functional mobility during ADLs: Minimal assistance General ADL Comments: Pt requiring hands on assistance while on his feet due to very poor coordination. Noted decreased ability to attend to L side of visual field and running into objects frequently.      Vision Baseline Vision/History: Wears glasses Patient Visual Report: Blurring of vision(sometimes double; difficulty on the L side) Vision Assessment?: Yes Eye Alignment: Within Functional Limits Ocular Range of Motion: Within Functional Limits Alignment/Gaze Preference: Within Defined Limits Tracking/Visual Pursuits: Impaired - to be further tested in  functional context(decreased attention/ability to hold eye position to L) Visual Fields: Impaired-to be further tested in functional context(decreased attention to L; able to read large print) Depth Perception:  Undershoots Additional Comments: Poor ability to navigate environment and noted decreased attention to L side of visual field.      Perception     Praxis Praxis Praxis tested?: Deficits Deficits: Organization;Perseveration Praxis-Other Comments: Perseveration during standing grooming tasks.     Pertinent Vitals/Pain Pain Assessment: No/denies pain     Hand Dominance     Extremity/Trunk Assessment Upper Extremity Assessment Upper Extremity Assessment: RUE deficits/detail;LUE deficits/detail RUE Deficits / Details: poor coordination RUE Coordination: decreased fine motor;decreased gross motor LUE Deficits / Details: Decreased strength; spasticity noted at end ranges. Minimal shoulder AROM due to previous CVA. Sensation slightly more "dull" than R. L fourth digit contracture and clawing of L hand.   Lower Extremity Assessment Lower Extremity Assessment: Defer to PT evaluation RLE Deficits / Details: 4/5 strength hip flex, knee flexion and extension LLE Deficits / Details: 5/5 strength   Cervical / Trunk Assessment Cervical / Trunk Assessment: Normal   Communication Communication Communication: HOH   Cognition Arousal/Alertness: Awake/alert Behavior During Therapy: WFL for tasks assessed/performed Overall Cognitive Status: Impaired/Different from baseline Area of Impairment: Attention;Following commands;Safety/judgement;Awareness;Problem solving                   Current Attention Level: Selective   Following Commands: Follows one step commands with increased time Safety/Judgement: Decreased awareness of safety;Decreased awareness of deficits Awareness: Intellectual Problem Solving: Slow processing General Comments: Pt with very poor insight into his deficits. He demonstrates poor safety awareness and attention.   General Comments  Pt reports "my son says I fall a lot but I don't remember"    Exercises     Shoulder Instructions      Home Living  Family/patient expects to be discharged to:: Private residence Living Arrangements: Children Available Help at Discharge: Family;Available PRN/intermittently(son works during the day) Type of Home: House Home Access: Stairs to enter Technical brewer of Steps: 4 Entrance Stairs-Rails: None Home Layout: One level     Bathroom Shower/Tub: Teacher, early years/pre: Standard     Home Equipment: None   Additional Comments: pt had DME but threw it away because he did not feel he needed it      Prior Functioning/Environment Level of Independence: Needs assistance  Gait / Transfers Assistance Needed: Multiple falls but ambulates without assistance. Son has to assist him off the floor.  ADL's / Homemaking Assistance Needed: Reports multiple falls in the shower but does his own dressing/bathing.             OT Problem List: Decreased strength;Decreased range of motion;Decreased activity tolerance;Impaired balance (sitting and/or standing);Decreased safety awareness;Decreased knowledge of use of DME or AE;Decreased knowledge of precautions;Impaired vision/perception;Decreased coordination;Decreased cognition      OT Treatment/Interventions: Self-care/ADL training;Therapeutic exercise;Energy conservation;DME and/or AE instruction;Therapeutic activities;Patient/family education;Balance training;Visual/perceptual remediation/compensation;Cognitive remediation/compensation;Neuromuscular education    OT Goals(Current goals can be found in the care plan section) Acute Rehab OT Goals Patient Stated Goal: return home OT Goal Formulation: With patient Time For Goal Achievement: 01/05/18 Potential to Achieve Goals: Good ADL Goals Pt Will Perform Grooming: with modified independence;standing Pt Will Transfer to Toilet: with modified independence;ambulating;bedside commode(BSC over toilet) Pt Will Perform Toileting - Clothing Manipulation and hygiene: with modified independence;sit  to/from stand Pt Will Perform Tub/Shower Transfer: Tub transfer;with modified independence;3 in 1;shower seat Pt/caregiver will Perform Home Exercise  Program: Right Upper extremity;With written HEP provided;Left upper extremity(PROM/AROM L UE; improved coordination R UE) Additional ADL Goal #1: Pt will demonstrate emergent awareness during standing grooming tasks. Additional ADL Goal #2: Pt will utilize compensatory strategies to attend to L side of environment to navigate busy hallway.  OT Frequency: Min 2X/week   Barriers to D/C: Decreased caregiver support          Co-evaluation              AM-PAC PT "6 Clicks" Daily Activity     Outcome Measure Help from another person eating meals?: A Little Help from another person taking care of personal grooming?: A Little Help from another person toileting, which includes using toliet, bedpan, or urinal?: A Little Help from another person bathing (including washing, rinsing, drying)?: A Little Help from another person to put on and taking off regular upper body clothing?: A Little Help from another person to put on and taking off regular lower body clothing?: A Little 6 Click Score: 18   End of Session Equipment Utilized During Treatment: Gait belt Nurse Communication: Mobility status  Activity Tolerance: Patient tolerated treatment well Patient left: in chair;with call bell/phone within reach;with chair alarm set;with nursing/sitter in room  OT Visit Diagnosis: Other abnormalities of gait and mobility (R26.89);Ataxia, unspecified (R27.0);Other symptoms and signs involving cognitive function;Hemiplegia and hemiparesis Hemiplegia - Right/Left: Left Hemiplegia - caused by: Cerebral infarction                Time: 0916-0950 OT Time Calculation (min): 34 min Charges:  OT General Charges $OT Visit: 1 Visit OT Evaluation $OT Eval Moderate Complexity: 1 Mod OT Treatments $Self Care/Home Management : 8-22 mins G-Codes:     Norman Herrlich, MS OTR/L  Pager: [place] A Mohamud Mrozek 12/22/2017, 11:23 AM

## 2017-12-23 ENCOUNTER — Encounter (HOSPITAL_COMMUNITY): Payer: Self-pay | Admitting: *Deleted

## 2017-12-23 ENCOUNTER — Observation Stay (HOSPITAL_COMMUNITY): Payer: Medicare Other

## 2017-12-23 ENCOUNTER — Other Ambulatory Visit: Payer: Self-pay

## 2017-12-23 DIAGNOSIS — Z23 Encounter for immunization: Secondary | ICD-10-CM | POA: Diagnosis not present

## 2017-12-23 DIAGNOSIS — S065X0A Traumatic subdural hemorrhage without loss of consciousness, initial encounter: Secondary | ICD-10-CM | POA: Diagnosis not present

## 2017-12-23 DIAGNOSIS — F419 Anxiety disorder, unspecified: Secondary | ICD-10-CM | POA: Diagnosis present

## 2017-12-23 DIAGNOSIS — F17210 Nicotine dependence, cigarettes, uncomplicated: Secondary | ICD-10-CM | POA: Diagnosis present

## 2017-12-23 DIAGNOSIS — S065X9A Traumatic subdural hemorrhage with loss of consciousness of unspecified duration, initial encounter: Secondary | ICD-10-CM | POA: Diagnosis present

## 2017-12-23 DIAGNOSIS — I10 Essential (primary) hypertension: Secondary | ICD-10-CM | POA: Diagnosis present

## 2017-12-23 DIAGNOSIS — F1010 Alcohol abuse, uncomplicated: Secondary | ICD-10-CM | POA: Diagnosis present

## 2017-12-23 DIAGNOSIS — W06XXXA Fall from bed, initial encounter: Secondary | ICD-10-CM | POA: Diagnosis not present

## 2017-12-23 DIAGNOSIS — F329 Major depressive disorder, single episode, unspecified: Secondary | ICD-10-CM | POA: Diagnosis present

## 2017-12-23 DIAGNOSIS — I6200 Nontraumatic subdural hemorrhage, unspecified: Secondary | ICD-10-CM | POA: Diagnosis not present

## 2017-12-23 MED ORDER — PNEUMOCOCCAL VAC POLYVALENT 25 MCG/0.5ML IJ INJ
0.5000 mL | INJECTION | INTRAMUSCULAR | Status: AC
Start: 2017-12-24 — End: 2017-12-24
  Administered 2017-12-24: 0.5 mL via INTRAMUSCULAR
  Filled 2017-12-23: qty 0.5

## 2017-12-23 NOTE — Care Management Obs Status (Signed)
[facility] NOTIFICATION   Patient Details  Name: [name] MRN: [id number] Date of Birth: 1961-10-06   Medicare Observation Status Notification Given:  Yes    Carles Collet, RN 12/23/2017, 9:39 AM

## 2017-12-23 NOTE — Progress Notes (Signed)
Physical Therapy Treatment Patient Details Name: [name] MRN: [id number] DOB: 06/20/1961 Today's Date: 12/23/2017    History of Present Illness 57 yo admitted after fall out of bed with Right SDH. PMHx: CVA with residual LUE weakness, ETOH abuse, polysubstance abuse, anxiety, COPD, HTN    PT Comments    Pt with increased activity and gait tolerance today. Pt with continued balance and safety deficits and pt reports daughter may be able to assist but that he and daughter do not get along. Pt agreeable to have assist at home and states he will use a seat for showering and is at least beginning to acknowledge his balance deficits despite his continued report that he didn't fall out of the bed but that his son said he did.      Follow Up Recommendations  Supervision/Assistance - 24 hour;SNF     Equipment Recommendations  3in1 (PT)    Recommendations for Other Services       Precautions / Restrictions Precautions Precautions: Fall Restrictions Weight Bearing Restrictions: No    Mobility  Bed Mobility Overal bed mobility: Needs Assistance Bed Mobility: Supine to Sit     Supine to sit: Supervision     General bed mobility comments: Pt able to get to EOB with cues for safety and lines  Transfers Overall transfer level: Needs assistance   Transfers: Sit to/from Stand Sit to Stand: Min guard         General transfer comment: cues for safety and balance  Ambulation/Gait Ambulation/Gait assistance: Min assist Ambulation Distance (Feet): 300 Feet Assistive device: None Gait Pattern/deviations: Step-through pattern;Decreased stride length;Narrow base of support   Gait velocity interpretation: Below normal speed for age/gender General Gait Details: pt with decreased assist for gait today with ability to maintain midline with cues for direction and safety.    Stairs            Wheelchair Mobility    Modified Rankin (Stroke Patients Only)       Balance  Overall balance assessment: Needs assistance   Sitting balance-Leahy Scale: Fair       Standing balance-Leahy Scale: Fair                   Standardized Balance Assessment Standardized Balance Assessment : Berg Balance Test Berg Balance Test Sit to Stand: Able to stand without using hands and stabilize independently Standing Unsupported: Able to stand 2 minutes with supervision Sitting with Back Unsupported but Feet Supported on Floor or Stool: Able to sit safely and securely 2 minutes Stand to Sit: Sits safely with minimal use of hands Transfers: Able to transfer safely, minor use of hands Standing Unsupported with Eyes Closed: Able to stand 10 seconds safely Standing Ubsupported with Feet Together: Able to place feet together independently and stand 1 minute safely From Standing, Reach Forward with Outstretched Arm: Can reach forward >12 cm safely (5") From Standing Position, Pick up Object from Floor: Able to pick up shoe safely and easily From Standing Position, Turn to Look Behind Over each Shoulder: Looks behind one side only/other side shows less weight shift Turn 360 Degrees: Able to turn 360 degrees safely in 4 seconds or less Standing Unsupported, Alternately Place Feet on Step/Stool: Able to complete >2 steps/needs minimal assist Standing Unsupported, One Foot in Front: Loses balance while stepping or standing Standing on One Leg: Unable to try or needs assist to prevent fall Total Score: 42        Cognition Arousal/Alertness: Awake/alert Behavior  During Therapy: WFL for tasks assessed/performed Overall Cognitive Status: Impaired/Different from baseline Area of Impairment: Attention;Following commands;Safety/judgement;Awareness;Problem solving                   Current Attention Level: Selective   Following Commands: Follows one step commands consistently Safety/Judgement: Decreased awareness of safety;Decreased awareness of deficits     General  Comments: pt continues to have decreased awareness of his balance and safety      Exercises      General Comments        Pertinent Vitals/Pain Pain Assessment: No/denies pain    Home Living                      Prior Function            PT Goals (current goals can now be found in the care plan section) Progress towards PT goals: Progressing toward goals    Frequency           PT Plan Current plan remains appropriate    Co-evaluation              AM-PAC PT "6 Clicks" Daily Activity  Outcome Measure  Difficulty turning over in bed (including adjusting bedclothes, sheets and blankets)?: None Difficulty moving from lying on back to sitting on the side of the bed? : A Little Difficulty sitting down on and standing up from a chair with arms (e.g., wheelchair, bedside commode, etc,.)?: A Little Help needed moving to and from a bed to chair (including a wheelchair)?: A Little Help needed walking in hospital room?: A Little Help needed climbing 3-5 steps with a railing? : A Little 6 Click Score: 19    End of Session Equipment Utilized During Treatment: Gait belt Activity Tolerance: Patient tolerated treatment well Patient left: in chair;with call bell/phone within reach;with chair alarm set Nurse Communication: Mobility status;Precautions PT Visit Diagnosis: Other abnormalities of gait and mobility (R26.89);Other symptoms and signs involving the nervous system (R29.898);Repeated falls (R29.6)     Time: 5366-4403 PT Time Calculation (min) (ACUTE ONLY): 23 min  Charges:  $Gait Training: 8-22 mins $Physical Performance Test: 8-22 mins                    G Codes:       Elwyn Reach, PT [phone]    [place] 12/23/2017, 12:49 PM

## 2017-12-23 NOTE — Progress Notes (Signed)
Report given to [facility] on 3W. Packing patient's belongings and will notify family of transfer. Milon Dethloff, Rande Brunt, RN

## 2017-12-23 NOTE — Progress Notes (Signed)
Occupational Therapy Treatment Patient Details Name: [name] MRN: [id number] DOB: November 01, 1960 Today's Date: 12/23/2017    History of present illness 57 yo admitted after fall out of bed with Right SDH. PMHx: CVA with residual LUE weakness, ETOH abuse, polysubstance abuse, anxiety, COPD, HTN   OT comments  Pt demonstrating progress toward OT goals this session. He was able to ambulate to the sink and complete LB bathing and dressing tasks with min guard assist and cues for safety today. Pt continues to require cues for safety throughout activities but was better able to follow commands today. Continue to recommend SNF level rehabilitation post-acute D/C. If unable to D/C to SNF, will need 24 hour assistance and home health OT follow-up.    Follow Up Recommendations  SNF;Supervision/Assistance - 24 hour    Equipment Recommendations  3 in 1 bedside commode;Tub/shower seat    Recommendations for Other Services      Precautions / Restrictions Precautions Precautions: Fall Restrictions Weight Bearing Restrictions: No       Mobility Bed Mobility Overal bed mobility: Needs Assistance Bed Mobility: Supine to Sit     Supine to sit: Supervision     General bed mobility comments: Supervision for safety.   Transfers Overall transfer level: Needs assistance   Transfers: Sit to/from Stand Sit to Stand: Min guard         General transfer comment: MIn guard assist for safety and stability.     Balance Overall balance assessment: Needs assistance Sitting-balance support: No upper extremity supported;Feet supported Sitting balance-Leahy Scale: Fair     Standing balance support: During functional activity;No upper extremity supported Standing balance-Leahy Scale: Fair Standing balance comment: statically able to stand without UE support but requires significant assistance to maintain balance during dynamic tasks.                  Standardized Balance  Assessment Standardized Balance Assessment : Berg Balance Test Berg Balance Test Sit to Stand: Able to stand without using hands and stabilize independently Standing Unsupported: Able to stand 2 minutes with supervision Sitting with Back Unsupported but Feet Supported on Floor or Stool: Able to sit safely and securely 2 minutes Stand to Sit: Sits safely with minimal use of hands Transfers: Able to transfer safely, minor use of hands Standing Unsupported with Eyes Closed: Able to stand 10 seconds safely Standing Ubsupported with Feet Together: Able to place feet together independently and stand 1 minute safely From Standing, Reach Forward with Outstretched Arm: Can reach forward >12 cm safely (5") From Standing Position, Pick up Object from Floor: Able to pick up shoe safely and easily From Standing Position, Turn to Look Behind Over each Shoulder: Looks behind one side only/other side shows less weight shift Turn 360 Degrees: Able to turn 360 degrees safely in 4 seconds or less Standing Unsupported, Alternately Place Feet on Step/Stool: Able to complete >2 steps/needs minimal assist Standing Unsupported, One Foot in Front: Loses balance while stepping or standing Standing on One Leg: Unable to try or needs assist to prevent fall Total Score: 42       ADL either performed or assessed with clinical judgement   ADL Overall ADL's : Needs assistance/impaired Eating/Feeding: Set up;Sitting   Grooming: Supervision/safety;Sitting   Upper Body Bathing: Sitting;Supervision/ safety   Lower Body Bathing: Sit to/from stand;Min guard   Upper Body Dressing : Supervision/safety;Sitting   Lower Body Dressing: Min guard;Sit to/from stand   Toilet Transfer: Min guard;Ambulation   Toileting- Clothing Manipulation and  Hygiene: Min guard;Sit to/from stand       Functional mobility during ADLs: Min guard General ADL Comments: Pt demonstrating slight improvement in stability today. Remains unsafe  and with decreased cognition.      Vision   Additional Comments: Has glasses today and was able to read menu. However, pt does report blurred vision during functional tasks today.    Perception     Praxis      Cognition Arousal/Alertness: Awake/alert Behavior During Therapy: WFL for tasks assessed/performed Overall Cognitive Status: Impaired/Different from baseline Area of Impairment: Attention;Following commands;Safety/judgement;Awareness;Problem solving                   Current Attention Level: Selective   Following Commands: Follows one step commands consistently Safety/Judgement: Decreased awareness of safety;Decreased awareness of deficits Awareness: Intellectual Problem Solving: Slow processing General Comments: Pt with poor awareness of his deficits.         Exercises Exercises: Other exercises Other Exercises Other Exercises: PROM and slow stretch for LUE to maximze functional positioning and use.    Shoulder Instructions       General Comments      Pertinent Vitals/ Pain       Pain Assessment: No/denies pain  Home Living                                          Prior Functioning/Environment              Frequency  Min 2X/week        Progress Toward Goals  OT Goals(current goals can now be found in the care plan section)  Progress towards OT goals: Progressing toward goals  Acute Rehab OT Goals Patient Stated Goal: return home OT Goal Formulation: With patient Time For Goal Achievement: 01/05/18 Potential to Achieve Goals: Good  Plan Discharge plan remains appropriate    Co-evaluation                 AM-PAC PT "6 Clicks" Daily Activity     Outcome Measure   Help from another person eating meals?: A Little Help from another person taking care of personal grooming?: A Little Help from another person toileting, which includes using toliet, bedpan, or urinal?: A Little Help from another person bathing  (including washing, rinsing, drying)?: A Little Help from another person to put on and taking off regular upper body clothing?: A Little Help from another person to put on and taking off regular lower body clothing?: A Little 6 Click Score: 18    End of Session Equipment Utilized During Treatment: Gait belt  OT Visit Diagnosis: Other abnormalities of gait and mobility (R26.89);Ataxia, unspecified (R27.0);Other symptoms and signs involving cognitive function;Hemiplegia and hemiparesis Hemiplegia - Right/Left: Left Hemiplegia - caused by: Cerebral infarction   Activity Tolerance Patient tolerated treatment well   Patient Left with call bell/phone within reach;in bed   Nurse Communication Mobility status        Time: 3009-2330 OT Time Calculation (min): 21 min  Charges: OT General Charges $OT Visit: 1 Visit OT Treatments $Self Care/Home Management : 8-22 mins  Norman Herrlich, MS OTR/L  Pager: [place] A Suheyb Raucci 12/23/2017, 1:47 PM

## 2017-12-24 MED ORDER — SODIUM CHLORIDE 0.9 % IV SOLN
Freq: Once | INTRAVENOUS | Status: AC
  Administered 2017-12-24: 01:00:00 via INTRAVENOUS

## 2017-12-24 MED ORDER — TRAMADOL HCL 50 MG PO TABS: 50.0000 mg | ORAL_TABLET | Freq: Four times a day (QID) | ORAL | 0 refills | Status: DC | PRN

## 2017-12-24 NOTE — Progress Notes (Signed)
Pt. Given discharge instructions and verbalized understanding. Pt. With questions about flying on a plane. Dr. Christella Noa paged and he discussed this with the patient.

## 2017-12-24 NOTE — Progress Notes (Signed)
Physical Therapy Treatment Patient Details Name: [name] MRN: [id number] DOB: May 17, 1961 Today's Date: 12/24/2017    History of Present Illness 57 yo admitted after fall out of bed with Right SDH. PMHx: CVA with residual LUE weakness, ETOH abuse, polysubstance abuse, anxiety, COPD, HTN    PT Comments    Patient continues to demonstrate decreased awareness of safety/deficits and with impaired balance increasing risk for falls. Continue to progress as tolerated. Pt irritated that he has not discharged yet and agreeable to limited mobility.    Follow Up Recommendations  Supervision/Assistance - 24 hour;SNF     Equipment Recommendations  3in1 (PT)    Recommendations for Other Services OT consult     Precautions / Restrictions Precautions Precautions: Fall Restrictions Weight Bearing Restrictions: No    Mobility  Bed Mobility Overal bed mobility: Needs Assistance Bed Mobility: Supine to Sit     Supine to sit: Supervision     General bed mobility comments: Supervision for safety.   Transfers Overall transfer level: Needs assistance Equipment used: None Transfers: Sit to/from Stand Sit to Stand: Min guard         General transfer comment: min guard for safety; pt impulsive to stand  Ambulation/Gait Ambulation/Gait assistance: Min guard Ambulation Distance (Feet): 200 Feet Assistive device: None Gait Pattern/deviations: Step-through pattern;Decreased stride length;Narrow base of support;Drifts right/left     General Gait Details: cues for safety, cadence, and direction   Stairs Stairs: Yes   Stair Management: One rail Right;Forwards;Alternating pattern Number of Stairs: 3 General stair comments: cues for safety; attempted to have pt ascend/descend steps without rail as he does not have rails at home however pt impulsive and not following commands  Wheelchair Mobility    Modified Rankin (Stroke Patients Only)       Balance Overall balance  assessment: Needs assistance Sitting-balance support: No upper extremity supported;Feet supported Sitting balance-Leahy Scale: Fair     Standing balance support: During functional activity;No upper extremity supported Standing balance-Leahy Scale: Fair                              Cognition Arousal/Alertness: Awake/alert Behavior During Therapy: WFL for tasks assessed/performed;Impulsive Overall Cognitive Status: No family/caregiver present to determine baseline cognitive functioning Area of Impairment: Safety/judgement;Problem solving                         Safety/Judgement: Decreased awareness of safety;Decreased awareness of deficits   Problem Solving: Slow processing General Comments: pt demonstrated poor insight into deficits      Exercises      General Comments        Pertinent Vitals/Pain Pain Assessment: Faces Faces Pain Scale: No hurt    Home Living                      Prior Function            PT Goals (current goals can now be found in the care plan section) Acute Rehab PT Goals Patient Stated Goal: return home PT Goal Formulation: With patient Time For Goal Achievement: 01/05/18 Potential to Achieve Goals: Fair Progress towards PT goals: Progressing toward goals    Frequency    Min 3X/week      PT Plan Current plan remains appropriate    Co-evaluation              AM-PAC PT "6 Clicks" Daily Activity  Outcome Measure  Difficulty turning over in bed (including adjusting bedclothes, sheets and blankets)?: None Difficulty moving from lying on back to sitting on the side of the bed? : A Little Difficulty sitting down on and standing up from a chair with arms (e.g., wheelchair, bedside commode, etc,.)?: A Little Help needed moving to and from a bed to chair (including a wheelchair)?: A Little Help needed walking in hospital room?: A Little Help needed climbing 3-5 steps with a railing? : A Little 6 Click  Score: 19    End of Session Equipment Utilized During Treatment: Gait belt Activity Tolerance: Patient tolerated treatment well Patient left: with call bell/phone within reach;in bed;with bed alarm set Nurse Communication: Mobility status;Other (comment)(pt requests medications) PT Visit Diagnosis: Other abnormalities of gait and mobility (R26.89);Other symptoms and signs involving the nervous system (R29.898);Repeated falls (R29.6)     Time: 1025-4862 PT Time Calculation (min) (ACUTE ONLY): 16 min  Charges:  $Gait Training: 8-22 mins                    G Codes:       Earney Navy, PTA Pager: [phone]     Darliss Cheney 12/24/2017, 4:47 PM

## 2017-12-24 NOTE — Progress Notes (Signed)
Patient ID: [name], male   DOB: 1961-02-19, 57 y.o.   MRN: [id number] BP 138/75 (BP Location: Left Arm)   Pulse 61   Temp 98.4 F (36.9 C) (Oral)   Resp 18   Ht 5\' 7"  (1.702 m)   Wt 80.1 kg (176 lb 9.4 oz)   SpO2 96%   BMI 27.66 kg/m  Alert, following all commands Head ct is stable Moving all extremities Perrl, full eom Will discharge tomorrow

## 2017-12-24 NOTE — Care Management Note (Signed)
Case Management Note  Patient Details  Name: [name] MRN: [id number] Date of Birth: [birth year]  Subjective/Objective:                 Spoke with patiet at the bedside. He is not interested in SNF at this time. He would like to DC to home. Requested HH PT and OT orders from MD in sticky note. Patient would like to use [facility] for [facility].    Action/Plan:  Patient will need HH orders and referral placed to [facility] at DC.   Expected Discharge Date:                  Expected Discharge Plan:  [place]  In-House Referral:     Discharge planning Services  CM Consult  Post Acute Care Choice:    Choice offered to:     DME Arranged:    DME Agency:     HH Arranged:  PT, OT HH Agency:  [facility]  Status of Service:  In process, will continue to follow  If discussed at Long Length of Stay Meetings, dates discussed:    Additional Comments:  Carles Collet, RN 12/24/2017, 11:18 AM

## 2017-12-24 NOTE — Progress Notes (Signed)
CSW notes patient would like to discharge home.   CSW signing off.  Percell Locus Briawna Carver LCSW [phone]

## 2017-12-25 ENCOUNTER — Other Ambulatory Visit: Payer: Self-pay | Admitting: Family Medicine

## 2017-12-25 ENCOUNTER — Telehealth: Payer: Self-pay | Admitting: Family Medicine

## 2017-12-25 NOTE — Telephone Encounter (Unsigned)
Copied from [place] [id number]. Topic: Quick Communication - See Telephone Encounter >> Dec 25, 2017  4:28 PM Neva Seat wrote: [place] - [phone]  Pt did not have PT today as ordered.  He says he is going out of town and will call when he returns if he wants PT. [facility] will call for verbal orders when he calls back.

## 2017-12-25 NOTE — Telephone Encounter (Signed)
Patient calling, needing to be filled asap. Leaving to go out of town today, brother is ill. Call back [phone]

## 2017-12-25 NOTE — Telephone Encounter (Signed)
It has been filled.

## 2017-12-26 NOTE — Telephone Encounter (Signed)
Noted  

## 2017-12-27 ENCOUNTER — Encounter: Payer: Self-pay | Admitting: Family Medicine

## 2018-01-01 ENCOUNTER — Ambulatory Visit: Payer: Medicare Other | Admitting: Neurology

## 2018-01-04 ENCOUNTER — Other Ambulatory Visit: Payer: Self-pay | Admitting: Neurology

## 2018-01-07 ENCOUNTER — Other Ambulatory Visit: Payer: Self-pay | Admitting: *Deleted

## 2018-01-07 NOTE — Telephone Encounter (Signed)
RF request for trazodone LOV: 08/22/17 Next ov: None Last written: 10/17/17 #180 w/ 0RF  Please advise. Thanks.

## 2018-01-07 NOTE — Discharge Summary (Signed)
BP 138/75 (BP Location: Left Arm)   Pulse 61   Temp 98.4 F (36.9 C) (Oral)   Resp 18   Ht 5\' 7"  (1.702 m)   Wt 80.1 kg (176 lb 9.4 oz)   SpO2 96%   BMI 27.66 kg/m  Physician Discharge Summary  Patient ID: [name] MRN: [id number] DOB/AGE: [birth year] 59 y.o.  Admit date: 12/22/2017 Discharge date: 01/07/2018  Admission Diagnoses:subdural hematoma  Discharge Diagnoses: subdural hematoma, alcohol abuse Active Problems:   Subdural hematoma [facility])   Discharged Condition: good  Hospital Course: [name] was admitted with a small subdural hematoma, not requiring surgical treatment. He was observed and noted to have a normal neurological examination. He had been treated for the same problem last year after being seen at [facility]. It was felt he needed 24 hr observation. The transferring physician did not mention his stay at [facility] less than a year before.He was discharged after arranging for family support. Continues to abuse alcohol.  Treatments: IV hydration  Discharge Exam: Blood pressure 138/75, pulse 61, temperature 98.4 F (36.9 C), temperature source Oral, resp. rate 18, height 5\' 7"  (1.702 m), weight 80.1 kg (176 lb 9.4 oz), SpO2 96 %. No change in admission physical exam.   Disposition: 01-Home or Self Care * No surgery found *  Allergies as of 12/24/2017      Reactions   Ambien [zolpidem] Other (See Comments)   Pt took too much, acted out a bad dream/was throwing things, etc--ED visit (07/2017)      Medication List    TAKE these medications   amLODipine 10 MG tablet Commonly known as:  NORVASC Take 1 tablet (10 mg total) by mouth daily.   baclofen 10 MG tablet Commonly known as:  LIORESAL Take 2 tablets (20 mg total) by mouth 3 (three) times daily.   DULoxetine 60 MG capsule Commonly known as:  CYMBALTA Take 1 capsule (60 mg total) daily by mouth.   irbesartan 300 MG tablet Commonly known as:  AVAPRO 1/2 tab po qd What changed:    how  much to take  how to take this  when to take this  additional instructions   metroNIDAZOLE 0.75 % gel Commonly known as:  METROGEL Apply to facial rash twice daily What changed:    how much to take  how to take this  when to take this  reasons to take this  additional instructions   QUEtiapine 400 MG tablet Commonly known as:  SEROQUEL TAKE 1 TABLET (400 MG TOTAL) BY MOUTH AT BEDTIME.   SLEEP AID PO Take 1-2 tablets by mouth at bedtime as needed (for sleep).   traMADol 50 MG tablet Commonly known as:  ULTRAM Take 1 tablet (50 mg total) by mouth every 6 (six) hours as needed for moderate pain.   traZODone 50 MG tablet Commonly known as:  DESYREL TAKE 1 TO 2 TABLETS AT BEDTIME AS NEEDED FOR INSOMNIA      Follow-up Information    Ashok Pall, MD Follow up in 2 week(s).   Specialty:  Neurosurgery Why:  please call to make an appointment Contact information: [address] [place] [address] [phone]           Signed: Winfield Cunas 01/07/2018, 9:30 AM

## 2018-01-09 ENCOUNTER — Other Ambulatory Visit: Payer: Self-pay

## 2018-01-09 MED ORDER — TRAZODONE HCL 50 MG PO TABS: ORAL_TABLET | ORAL | 0 refills | Status: DC

## 2018-02-05 ENCOUNTER — Other Ambulatory Visit: Payer: Self-pay | Admitting: Family Medicine

## 2018-02-06 ENCOUNTER — Other Ambulatory Visit: Payer: Self-pay | Admitting: *Deleted

## 2018-02-06 MED ORDER — METOPROLOL SUCCINATE ER 50 MG PO TB24: 50.0000 mg | ORAL_TABLET | Freq: Every day | ORAL | 0 refills | Status: DC

## 2018-02-06 NOTE — Telephone Encounter (Signed)
Pt over due for f/u [facility], will send in RF's for 90 day supply w/ 0RF. Needs office visit for more refills.

## 2018-02-06 NOTE — Telephone Encounter (Signed)
Pt advised and voiced understanding.   

## 2018-02-06 NOTE — Telephone Encounter (Signed)
Pt called stated that he was flying home from [place] and his luggage was lost (which had all his medications). Pt requested refills on all medications.    [facility] at [facility]..pt has refills on file for: amlodipine, seroquel and trazodone, they will get these ready for pt.   Pt did not have any refills left on the metoprolol, we will send in refill for this medication.   Pt advised and voiced understanding.

## 2018-02-18 ENCOUNTER — Telehealth: Payer: Self-pay | Admitting: Neurology

## 2018-02-18 NOTE — Telephone Encounter (Signed)
Patient called to let you know that his medication Baclofen is not working. Thanks

## 2018-02-19 NOTE — Telephone Encounter (Signed)
Called patient and left message that he really needs the Botox injection per Dr. Posey Pronto.  Suggested that he call back to reschedule for the injection.

## 2018-02-20 ENCOUNTER — Ambulatory Visit: Payer: Medicare Other | Admitting: Neurology

## 2018-03-03 ENCOUNTER — Other Ambulatory Visit: Payer: Self-pay | Admitting: Family Medicine

## 2018-03-26 ENCOUNTER — Other Ambulatory Visit: Payer: Self-pay | Admitting: Family Medicine

## 2018-03-27 ENCOUNTER — Encounter: Payer: Self-pay | Admitting: *Deleted

## 2018-03-27 ENCOUNTER — Emergency Department
Admission: EM | Admit: 2018-03-27 | Discharge: 2018-03-28 | Disposition: A | Discharge status: Home / Self Care | Payer: Medicare Other | Attending: Emergency Medicine | Admitting: Emergency Medicine

## 2018-03-27 ENCOUNTER — Emergency Department: Payer: Medicare Other

## 2018-03-27 ENCOUNTER — Ambulatory Visit: Payer: Self-pay

## 2018-03-27 ENCOUNTER — Other Ambulatory Visit: Payer: Self-pay

## 2018-03-27 DIAGNOSIS — R410 Disorientation, unspecified: Secondary | ICD-10-CM | POA: Diagnosis not present

## 2018-03-27 DIAGNOSIS — I10 Essential (primary) hypertension: Secondary | ICD-10-CM | POA: Diagnosis not present

## 2018-03-27 DIAGNOSIS — Z79899 Other long term (current) drug therapy: Secondary | ICD-10-CM | POA: Insufficient documentation

## 2018-03-27 DIAGNOSIS — F17210 Nicotine dependence, cigarettes, uncomplicated: Secondary | ICD-10-CM | POA: Insufficient documentation

## 2018-03-27 DIAGNOSIS — R2243 Localized swelling, mass and lump, lower limb, bilateral: Secondary | ICD-10-CM | POA: Insufficient documentation

## 2018-03-27 DIAGNOSIS — J449 Chronic obstructive pulmonary disease, unspecified: Secondary | ICD-10-CM | POA: Diagnosis not present

## 2018-03-27 DIAGNOSIS — R600 Localized edema: Secondary | ICD-10-CM | POA: Diagnosis not present

## 2018-03-27 DIAGNOSIS — E876 Hypokalemia: Secondary | ICD-10-CM | POA: Insufficient documentation

## 2018-03-27 DIAGNOSIS — M7989 Other specified soft tissue disorders: Secondary | ICD-10-CM | POA: Diagnosis not present

## 2018-03-27 DIAGNOSIS — R609 Edema, unspecified: Secondary | ICD-10-CM

## 2018-03-27 LAB — URINALYSIS, COMPLETE (UACMP) WITH MICROSCOPIC
Bacteria, UA: NONE SEEN
Bilirubin Urine: NEGATIVE
Glucose, UA: NEGATIVE mg/dL
Hgb urine dipstick: NEGATIVE
KETONES UR: NEGATIVE mg/dL
Leukocytes, UA: NEGATIVE
Nitrite: NEGATIVE
PH: 6 (ref 5.0–8.0)
PROTEIN: NEGATIVE mg/dL
SQUAMOUS EPITHELIAL / LPF: NONE SEEN (ref 0–5)
Specific Gravity, Urine: 1.011 (ref 1.005–1.030)

## 2018-03-27 LAB — CBC
HCT: 39 % — ABNORMAL LOW (ref 40.0–52.0)
Hemoglobin: 13.3 g/dL (ref 13.0–18.0)
MCH: 32.9 pg (ref 26.0–34.0)
MCHC: 34.1 g/dL (ref 32.0–36.0)
MCV: 96.5 fL (ref 80.0–100.0)
PLATELETS: 313 10*3/uL (ref 150–440)
RBC: 4.04 MIL/uL — AB (ref 4.40–5.90)
RDW: 14.8 % — ABNORMAL HIGH (ref 11.5–14.5)
WBC: 7.3 10*3/uL (ref 3.8–10.6)

## 2018-03-27 LAB — BASIC METABOLIC PANEL
Anion gap: 11 (ref 5–15)
BUN: 15 mg/dL (ref 6–20)
CHLORIDE: 107 mmol/L (ref 101–111)
CO2: 23 mmol/L (ref 22–32)
CREATININE: 0.91 mg/dL (ref 0.61–1.24)
Calcium: 8.8 mg/dL — ABNORMAL LOW (ref 8.9–10.3)
GFR calc Af Amer: >60 mL/min (ref >60)
GFR calc non Af Amer: >60 mL/min (ref >60)
Glucose, Bld: 146 mg/dL — ABNORMAL HIGH (ref 65–99)
Potassium: 2.9 mmol/L — ABNORMAL LOW (ref 3.5–5.1)
Sodium: 141 mmol/L (ref 135–145)

## 2018-03-27 LAB — TROPONIN I

## 2018-03-27 MED ORDER — FUROSEMIDE 10 MG/ML IJ SOLN
20.0000 mg | Freq: Once | INTRAMUSCULAR | Status: AC
  Administered 2018-03-28: 20 mg via INTRAVENOUS

## 2018-03-27 MED ORDER — POTASSIUM CHLORIDE CRYS ER 20 MEQ PO TBCR
40.0000 meq | EXTENDED_RELEASE_TABLET | Freq: Once | ORAL | Status: AC
  Administered 2018-03-28: 40 meq via ORAL

## 2018-03-27 NOTE — ED Notes (Signed)
Report given to Jenna RN

## 2018-03-27 NOTE — Telephone Encounter (Signed)
Noted  

## 2018-03-27 NOTE — ED Triage Notes (Signed)
Pt family reports they noticed some slurring of his speech yesterday and more confusion over the last several days.

## 2018-03-27 NOTE — ED Notes (Signed)
Patient transported to CT 

## 2018-03-27 NOTE — ED Triage Notes (Signed)
Pt reports bilateral lower extremity swelling and left arm swelling for "a couple of months". Pt says that he has intermittent swelling since his stroke but when his blood pressure is up he has swelling. Pt says his bp was 180/100 at home. Dizziness when he stands up. Denies cp or sob.

## 2018-03-27 NOTE — Telephone Encounter (Signed)
Called to request refill on Irbesartan.  Stated he went to [place] twice, recently; the last trip was 5/19, for a funeral in [place].  Reported they lost his luggage, and he is out of his Irbesartan.  Reported he has had increase in swelling over the past 3-4 days, since he's been out of the medication.  Stated he is taking his Amlodipine and Metoprolol, and has not missed any doses.  Reported the swelling is in his bilat. LE with (R) > (L) ; stated the swelling goes from his toes to the left knee, and from the toes to the right hip.  Denied any redness or warmth in the LE's.  Reported he also has swelling in his upper extremities, from his (L) hand to elbow, and on right hand.  Denied any shortness of breath, or chest pain.  Reported he has intermittent congested cough.  Stated coughs up "a little phlegm."  BP. 140/67, P. 71 while on triage call today.  Reported last week, BP was "200/180."  Based on clinical judgement, strongly advised pt. To go to ER, due to severity of swelling and of intermittent congested cough.  Stated his right leg from hip to foot hurts when he coughs.  Pt. stated he preferred to get an appt. And get his BP medication refilled.  Advised that his condition warrants being seen in the ER today.  Pt. verb. understanding.  Stated he will try to find a ride.    Reason for Disposition . SEVERE leg swelling (e.g., swelling extends above knee, entire leg is swollen, weeping fluid)  Answer Assessment - Initial Assessment Questions 1. ONSET: "When did the swelling start?" (e.g., minutes, hours, days)     "a lot of swelling in bilateral feet and legs; (R) > (L); swelling in both hands and in left forearm, and in right hand.  2. LOCATION: "What part of the leg is swollen?"  "Are both legs swollen or just one leg?"     It is swollen from toes to knee on left, and toes to hip on right ;  can barely move toes  3. SEVERITY: "How bad is the swelling?" (e.g., localized; mild, moderate, severe)  - Localized  - small area of swelling localized to one leg  - MILD pedal edema - swelling limited to foot and ankle, pitting edema < 1/4 inch (6 mm) deep, rest and elevation eliminate most or all swelling  - MODERATE edema - swelling of lower leg to knee, pitting edema > 1/4 inch    (6 mm) deep, rest and elevation only partially reduce swelling  - SEVERE edema - swelling extends above knee, facial or hand swelling present      Severe 4. REDNESS: "Does the swelling look red or infected?"     denied 5. PAIN: "Is the swelling painful to touch?" If so, ask: "How painful is it?"   (Scale 1-10; mild, moderate or severe)     Pain in feet and right leg; on feet the pain becomes severe; 6. FEVER: "Do you have a fever?" If so, ask: "What is it, how was it measured, and when did it start?"      Denied fever 7. CAUSE: "What do you think is causing the leg swelling?"     Out of his BP medication x 3-4 days.  8. MEDICAL HISTORY: "Do you have a history of heart failure, kidney disease, liver failure, or cancer?"     Denied CHF 9. RECURRENT SYMPTOM: "Have you had leg swelling  before?" If so, ask: "When was the last time?" "What happened that time?"     About a month ago  10. OTHER SYMPTOMS: "Do you have any other symptoms?" (e.g., chest pain, difficulty breathing)      Denied shortness of breath or chest pain; has a congested cough,  Protocols used: LEG SWELLING AND EDEMA-A-AH

## 2018-03-27 NOTE — ED Provider Notes (Signed)
[facility] Emergency Department Provider Note   ____________________________________________   First MD Initiated Contact with Patient 03/27/18 2316     (approximate)  I have reviewed the triage vital signs and the nursing notes.   HISTORY  Chief Complaint Leg Swelling    HPI [name] is a 57 y.o. male who presents to the ED from home with a chief complaint of bilateral lower extremity swelling.  Patient has a history of hypertension, COPD, history of alcoholism and polysubstance abuse, history of subdural hematoma who states he has had intermittent issues with elevated blood pressure which leads to leg swelling ever since his stroke in 2001.  Reports increased stress this week with elevation of his blood pressure.  States he ran out of his Irbesartan 1 week ago.  Reports he called his PCP today who refused to refill his medication unless he was evaluated in the emergency department and then he was instructed to make an appointment to see his PCP before his medicines would be refilled.  Complains of swelling to bilateral lower extremities with pain.  Mentions that he had recent travel to [place] for family members funeral.  Denies associated fever, chills, chest pain, shortness of breath, abdominal pain, nausea or vomiting.   Past Medical History:  Diagnosis Date  . Alcoholism ([place])    Continues to abuse ETOH as of 01/2017; pt has poor insight into his problem.  Pt states that as of 04/2017 he has been 3 monthds sober.  . Anxiety and depression   . Arthritis    "hands, legs" (12/23/2017)  . Chronic joint pain   . COPD (chronic obstructive pulmonary disease) ([place])   . Depression   . Frequent headaches    "depends on BP" (12/23/2017)  . History of kidney stones   . Hypertension   . Insomnia 08/26/2017   08/23/17 ED visit for adverse rxn to taking ambien, PLUS + marijuana on UDS her and in ED---NO FURTHER AMBIEN OR ANY OTHER CONTROLLED SUBSTANCES WILL BE  PRESCRIBED BY ME--PM  . Pneumothorax, right   . Polysubstance abuse ([place])   . Poor historian    UNRELIABLE HISTORIAN  . Stroke ([place]) 2012   LUE residual deficit: flexion contracture and weakness of L hand.  Old bilateral cerebral infarcts involving the right frontal and parietal lobes as well as the left occipital lobe.  Baclofen and botox via neuro for L arm contracture.  . Stroke [facility])    "3 mini strokes since 2012" (12/23/2017)  . Subdural hematoma ([place]) 01/29/2017   Subacute right-sided frontal subdural hematoma: found on CT in [place] ED, transferred to [facility] where this was non-operatively managed.  Pt admitted to the MDs at [facility] that he still drinks lots of whisky daily and they surmised that his frequent falls of late are due to this and frequent use of OTC sleep aids.  . Venous insufficiency of both lower extremities     Patient Active Problem List   Diagnosis Date Noted  . Subdural hematoma ([place]) 12/22/2017  . Depression   . TOBACCO ABUSE 11/08/2008  . C O P D 11/08/2008  . Pneumothorax 10/19/2008  . PNEUMOTHORAX 10/19/2008    Past Surgical History:  Procedure Laterality Date  . CHEST TUBE INSERTION    . LUNG SURGERY    . SHOULDER OPEN ROTATOR CUFF REPAIR Bilateral    Multiple rotator cuff surgeries on both sides.  . TONSILLECTOMY    . TYMPANOPLASTY Right   . VIDEO ASSISTED THORACOSCOPY (  VATS) W/TALC PLEUADESIS  2010 X2    Prior to Admission medications   Medication Sig Start Date End Date Taking? Authorizing Provider  amLODipine (NORVASC) 10 MG tablet Take 1 tablet (10 mg total) by mouth daily. 09/22/17   McGowen, Adrian Blackwater, MD  baclofen (LIORESAL) 10 MG tablet Take 2 tablets (20 mg total) by mouth 3 (three) times daily. 12/12/17   Narda Amber K, DO  baclofen (LIORESAL) 10 MG tablet 20 mg tid 01/05/18   Narda Amber K, DO  Doxylamine Succinate, Sleep, (SLEEP AID PO) Take 1-2 tablets by mouth at bedtime as needed (for sleep).    [provider]  DULoxetine  (CYMBALTA) 60 MG capsule TAKE 1 CAPSULE (60 MG TOTAL) DAILY BY MOUTH. 02/06/18   McGowen, Adrian Blackwater, MD  irbesartan (AVAPRO) 300 MG tablet Take 1 tablet (300 mg total) by mouth daily. 03/28/18   Paulette Blanch, MD  metoprolol succinate (TOPROL-XL) 50 MG 24 hr tablet Take 1 tablet (50 mg total) by mouth daily. Take with or immediately following a meal. 02/06/18   McGowen, Adrian Blackwater, MD  metroNIDAZOLE (METROGEL) 0.75 % gel Apply to facial rash twice daily Patient taking differently: Apply 1 application topically as needed. Apply to facial rash 08/22/17   McGowen, Adrian Blackwater, MD  QUEtiapine (SEROQUEL) 400 MG tablet TAKE 1 TABLET (400 MG TOTAL) BY MOUTH AT BEDTIME. 12/15/17   McGowen, Adrian Blackwater, MD  traMADol (ULTRAM) 50 MG tablet Take 1 tablet (50 mg total) by mouth every 6 (six) hours as needed for moderate pain. 12/24/17   Ashok Pall, MD  traZODone (DESYREL) 50 MG tablet TAKE 1 TO 2 TABLETS AT BEDTIME AS NEEDED FOR INSOMNIA 01/09/18   McGowen, Adrian Blackwater, MD    Allergies Ambien [zolpidem]  Family History  Problem Relation Age of Onset  . Arthritis Mother   . Heart disease Father   . Hypertension Father   . Breast cancer Maternal Aunt   . Alcohol abuse Maternal Uncle   . Lung cancer Maternal Aunt   . Cancer Brother        bone    Social History Social History   Tobacco Use  . Smoking status: Current Every Day Smoker    Packs/day: 1.00    Years: 44.00    Pack years: 44.00    Types: Cigarettes  . Smokeless tobacco: Never Used  Substance Use Topics  . Alcohol use: No    Comment: Quit summer 2018. (12/23/2017)  . Drug use: Yes    Frequency: 2.0 times per week    Types: Marijuana    Comment: 12/23/2017 "no more than once/day; ave 3-4 days/wk"    Review of Systems  Constitutional: No fever/chills Eyes: No visual changes. ENT: No sore throat. Cardiovascular: Denies chest pain. Respiratory: Denies shortness of breath. Gastrointestinal: No abdominal pain.  No nausea, no vomiting.  No  diarrhea.  No constipation. Genitourinary: Negative for dysuria. Musculoskeletal: Positive for bilateral lower extremity swelling.  Negative for back pain. Skin: Negative for rash. Neurological: Negative for headaches, focal weakness or numbness.   ____________________________________________   PHYSICAL EXAM:  VITAL SIGNS: ED Triage Vitals  Enc Vitals Group     BP 03/27/18 2030 113/72     Pulse Rate 03/27/18 2030 70     Resp 03/27/18 2030 18     Temp 03/27/18 2030 98.2 F (36.8 C)     Temp Source 03/27/18 2030 Oral     SpO2 03/27/18 2030 96 %     Weight  03/27/18 2030 185 lb (83.9 kg)     Height 03/27/18 2030 5\' 7"  (1.702 m)     Head Circumference --      Peak Flow --      Pain Score 03/27/18 2036 8     Pain Loc --      Pain Edu? --      Excl. in [place]? --     Constitutional: Alert and oriented. Well appearing and in no acute distress.  Listening to music on his cell phone. Eyes: Conjunctivae are normal. PERRL. EOMI. Head: Atraumatic. Nose: No congestion/rhinnorhea. Mouth/Throat: Mucous membranes are moist.  Oropharynx non-erythematous. Neck: No stridor.  No carotid bruits. Cardiovascular: Normal rate, regular rhythm. Grossly normal heart sounds.  Good peripheral circulation. Respiratory: Normal respiratory effort.  No retractions. Lungs CTAB. Gastrointestinal: Soft and nontender. No distention. No abdominal bruits. No CVA tenderness. Musculoskeletal: No lower extremity tenderness. 1+ BLE pitting edema.  No joint effusions. Neurologic: Alert and oriented x3.  CN II to XII grossly intact.  Normal speech and language. No gross focal neurologic deficits are appreciated other than baseline left hand weakness per patient. MAEx4. Skin:  Skin is warm, dry and intact. No rash noted. Psychiatric: Mood and affect are normal. Speech and behavior are normal.  ____________________________________________   LABS (all labs ordered are listed, but only abnormal results are  displayed)  Labs Reviewed  BASIC METABOLIC PANEL - Abnormal; Notable for the following components:      Result Value   Potassium 2.9 (*)    Glucose, Bld 146 (*)    Calcium 8.8 (*)    All other components within normal limits  CBC - Abnormal; Notable for the following components:   RBC 4.04 (*)    HCT 39.0 (*)    RDW 14.8 (*)    All other components within normal limits  URINALYSIS, COMPLETE (UACMP) WITH MICROSCOPIC - Abnormal; Notable for the following components:   Color, Urine STRAW (*)    APPearance CLEAR (*)    All other components within normal limits  TROPONIN I  BRAIN NATRIURETIC PEPTIDE   ____________________________________________  EKG  ED ECG REPORT I, SUNG,JADE J, the attending physician, personally viewed and interpreted this ECG.   Date: 03/28/2018  EKG Time: 2049  Rate: 65  Rhythm: normal EKG, normal sinus rhythm  Axis: Normal  Intervals:none  ST&T Change: Nonspecific  ____________________________________________  RADIOLOGY  ED MD interpretation: No acute intracranial abnormality; resolving old subdural hematoma; no active cardiopulmonary process; no DVT  Official radiology report(s): Dg Chest 2 View  Result Date: 03/27/2018 CLINICAL DATA:  Bilateral LOWER extremity and LEFT arm swelling for several months. EXAM: CHEST - 2 VIEW COMPARISON:  08/07/2014 and prior radiographs FINDINGS: The cardiomediastinal silhouette is unremarkable. There is no evidence of focal airspace disease, pulmonary edema, suspicious pulmonary nodule/mass, pleural effusion, or pneumothorax. No acute bony abnormalities are identified. IMPRESSION: No active cardiopulmonary disease. Electronically Signed   By: Margarette Canada M.D.   On: 03/27/2018 23:57   Ct Head Wo Contrast  Result Date: 03/27/2018 CLINICAL DATA:  57 year old male with acute confusion for several days and slurred speech for 1 day. EXAM: CT HEAD WITHOUT CONTRAST TECHNIQUE: Contiguous axial images were obtained from the  base of the skull through the vertex without intravenous contrast. COMPARISON:  12/23/2017 and prior CTs FINDINGS: Brain: No evidence of acute infarction, hemorrhage, hydrocephalus, new extra-axial collection or mass lesion/mass effect. The RIGHT subdural hematoma on 12/23/2017 has nearly completely resolved and now measures 2 mm  in greatest diameter, previously 10 mm. Atrophy and remote RIGHT frontal, RIGHT parietal and LEFT occipital infarcts again noted. Mild chronic small-vessel white matter ischemic changes again identified. Vascular: Mild atherosclerotic calcifications noted. Skull: Normal. Negative for fracture or focal lesion. Sinuses/Orbits: No acute finding. Other: None. IMPRESSION: 1. No evidence of acute intracranial abnormality 2. Near complete resolution of RIGHT subdural hematoma since the prior study with 2 mm residual collection. No midline shift. 3. Atrophy, chronic small-vessel white matter ischemic changes and remote infarcts as described. Electronically Signed   By: Margarette Canada M.D.   On: 03/27/2018 23:53   US Venous Img Lower Bilateral  Result Date: 03/28/2018 CLINICAL DATA:  Chronic bilateral lower extremity swelling, worse on the right. EXAM: BILATERAL LOWER EXTREMITY VENOUS DOPPLER ULTRASOUND TECHNIQUE: Gray-scale sonography with graded compression, as well as color Doppler and duplex ultrasound were performed to evaluate the lower extremity deep venous systems from the level of the common femoral vein and including the common femoral, femoral, profunda femoral, popliteal and calf veins including the posterior tibial, peroneal and gastrocnemius veins when visible. The superficial great saphenous vein was also interrogated. Spectral Doppler was utilized to evaluate flow at rest and with distal augmentation maneuvers in the common femoral, femoral and popliteal veins. COMPARISON:  None. FINDINGS: RIGHT LOWER EXTREMITY Common Femoral Vein: No evidence of thrombus. Normal compressibility,  respiratory phasicity and response to augmentation. Saphenofemoral Junction: No evidence of thrombus. Normal compressibility and flow on color Doppler imaging. Profunda Femoral Vein: No evidence of thrombus. Normal compressibility and flow on color Doppler imaging. Femoral Vein: No evidence of thrombus. Normal compressibility, respiratory phasicity and response to augmentation. Popliteal Vein: No evidence of thrombus. Normal compressibility, respiratory phasicity and response to augmentation. Calf Veins: No evidence of thrombus. Normal compressibility and flow on color Doppler imaging. Superficial Great Saphenous Vein: No evidence of thrombus. Normal compressibility. Venous Reflux:  None. Other Findings:  None. LEFT LOWER EXTREMITY Common Femoral Vein: No evidence of thrombus. Normal compressibility, respiratory phasicity and response to augmentation. Saphenofemoral Junction: No evidence of thrombus. Normal compressibility and flow on color Doppler imaging. Profunda Femoral Vein: No evidence of thrombus. Normal compressibility and flow on color Doppler imaging. Femoral Vein: No evidence of thrombus. Normal compressibility, respiratory phasicity and response to augmentation. Popliteal Vein: No evidence of thrombus. Normal compressibility, respiratory phasicity and response to augmentation. Calf Veins: No evidence of thrombus. Normal compressibility and flow on color Doppler imaging. Superficial Great Saphenous Vein: No evidence of thrombus. Normal compressibility. Venous Reflux:  None. Other Findings:  None. IMPRESSION: No evidence of deep venous thrombosis. Electronically Signed   By: Garald Balding M.D.   On: 03/28/2018 01:19    ____________________________________________   PROCEDURES  Procedure(s) performed: None  Procedures  Critical Care performed: No  ____________________________________________   INITIAL IMPRESSION / ASSESSMENT AND PLAN / ED COURSE  As part of my medical decision making, I  reviewed the following data within the [place] notes reviewed and incorporated, Labs reviewed, EKG interpreted, Old chart reviewed, Radiograph reviewed and Notes from prior ED visits   57 year old male with hypertension, COPD, prior history of stroke who presents with bilateral lower extremity swelling.  Differential diagnosis includes but is not limited to peripheral edema, venous stasis, DVT, CHF, etc.   Laboratory and urinalysis results remarkable for mild hypokalemia; normal troponin.  Will administer 20 mg IV Lasix for diuresis, obtain Doppler ultrasounds of bilateral lower extremities to evaluate for DVT and reassess.  CT head was added  for a family member's concern of patient with slurring speech yesterday and confusion.  However, there is no family at bedside at the time of my interview.  Patient reports baseline weakness of his left hand from his prior stroke.   Clinical Course as of Mar 29 739  Sat Mar 28, 2018  0309 Patient ambulating with steady gait to use the commode in his treatment room.  Updated him of all test and imaging results.  He has had nice urine output from the 20 mg IV Lasix given.  Will administer Norco for leg pain.  Will restart ibesartan and patient will follow-up closely with his PCP.  Strict return precautions given.  Patient verbalizes understanding and agrees with plan of care.   [JS]    Clinical Course User Index [JS] Paulette Blanch, MD     ____________________________________________   FINAL CLINICAL IMPRESSION(S) / ED DIAGNOSES  Final diagnoses:  Peripheral edema  Bilateral lower extremity edema  Hypokalemia     ED Discharge Orders        Ordered    irbesartan (AVAPRO) 300 MG tablet  Daily    Note to Pharmacy:  Needs office visit for more refills.   03/28/18 9741       Note:  This document was prepared using Dragon voice recognition software and may include unintentional dictation errors.    Paulette Blanch,  MD 03/28/18 [phone]

## 2018-03-28 ENCOUNTER — Emergency Department: Payer: Medicare Other

## 2018-03-28 DIAGNOSIS — M7989 Other specified soft tissue disorders: Secondary | ICD-10-CM | POA: Diagnosis not present

## 2018-03-28 LAB — BRAIN NATRIURETIC PEPTIDE: B Natriuretic Peptide: 45 pg/mL (ref 0.0–100.0)

## 2018-03-28 MED ORDER — OXYCODONE-ACETAMINOPHEN 5-325 MG PO TABS
1.0000 | ORAL_TABLET | Freq: Once | ORAL | Status: AC
  Administered 2018-03-28: 1 via ORAL
  Filled 2018-03-28: qty 1

## 2018-03-28 MED ORDER — POTASSIUM CHLORIDE CRYS ER 20 MEQ PO TBCR
EXTENDED_RELEASE_TABLET | ORAL | Status: AC
  Filled 2018-03-28: qty 2

## 2018-03-28 MED ORDER — FUROSEMIDE 10 MG/ML IJ SOLN
INTRAMUSCULAR | Status: AC
  Filled 2018-03-28: qty 4

## 2018-03-28 MED ORDER — IRBESARTAN 300 MG PO TABS: 300.0000 mg | ORAL_TABLET | Freq: Every day | ORAL | 0 refills | Status: DC

## 2018-03-28 NOTE — Discharge Instructions (Signed)
1.  Restart Irbesartan 300mg  daily. 2.  Elevate legs whenever possible to reduce swelling. 3.  You may wear compression stockings which can be purchased over-the-counter. 4.  Return to the ER for worsening symptoms, persistent vomiting, difficulty breathing or other concerns.

## 2018-03-31 ENCOUNTER — Other Ambulatory Visit: Payer: Self-pay | Admitting: Family Medicine

## 2018-04-01 ENCOUNTER — Telehealth: Payer: Self-pay | Admitting: Family Medicine

## 2018-04-01 NOTE — Telephone Encounter (Signed)
Copied from [place]. Topic: Quick Communication - See Telephone Encounter >> Apr 01, 2018  4:48 PM Bea Graff, NT wrote: CRM for notification. See Telephone encounter for: 04/01/18. Pt calling and states the swelling has came back since his ER visit on Friday. Pt states his blood pressure is still elevated and would like to have Dr. Anitra Lauth to prescribe medication. He could not come in for an appointment until next Friday and Dr. Anitra Lauth will be out of the office. Advised pt that he should come in sooner if at all possible. Please advice.

## 2018-04-02 NOTE — Telephone Encounter (Signed)
Pts last ov was 08/22/17. Pt was to f/u in January 2019. No apt was made. Pt was advised on 02/05/18 that he needed to f/u and that no more refills would be sent until he was seen for f/u. Pt was seen in ER 03/27/18 advised to f/u w/ PCP closely. No apt has been made.

## 2018-04-02 NOTE — Telephone Encounter (Signed)
Returning call, [phone]. Patient states that he was to be scheduled for 04/03/18 for ER Follow up, no appt scheduled

## 2018-04-02 NOTE — Telephone Encounter (Signed)
LM requesting call back to discuss symptoms and schedule sooner appt with PCP.

## 2018-04-03 ENCOUNTER — Ambulatory Visit (INDEPENDENT_AMBULATORY_CARE_PROVIDER_SITE_OTHER): Payer: Medicare Other | Admitting: Family Medicine

## 2018-04-03 ENCOUNTER — Encounter: Payer: Self-pay | Admitting: Family Medicine

## 2018-04-03 VITALS — BP 119/69 | HR 57 | Temp 98.3°F | Resp 16 | Ht 67.0 in | Wt 186.4 lb

## 2018-04-03 DIAGNOSIS — T50905A Adverse effect of unspecified drugs, medicaments and biological substances, initial encounter: Secondary | ICD-10-CM

## 2018-04-03 DIAGNOSIS — I872 Venous insufficiency (chronic) (peripheral): Secondary | ICD-10-CM

## 2018-04-03 DIAGNOSIS — I10 Essential (primary) hypertension: Secondary | ICD-10-CM

## 2018-04-03 DIAGNOSIS — E876 Hypokalemia: Secondary | ICD-10-CM | POA: Diagnosis not present

## 2018-04-03 MED ORDER — LISINOPRIL 20 MG PO TABS: 20.0000 mg | ORAL_TABLET | Freq: Every day | ORAL | 0 refills | Status: DC

## 2018-04-03 NOTE — Progress Notes (Signed)
OFFICE VISIT  04/03/2018   CC:  Chief Complaint  Patient presents with  . Follow-up    ER visit   HPI:    Patient is a 57 y.o. Caucasian male who presents for 1 week f/u from ED visit 03/27/18. He presented there for bilat LL edema, had been off one or more of his bp meds due to running out. Says he has hx of increased bilat LE edema when his bp is out of control---as it has been much of the time lately. Labs fine except K+ 2.9.  Imaging fine--see below. Was given IV lasix 20mg , a couple of potassium pills, and was sent home to restart irbesartan. Was also given some norco for his leg pains.  Says swelling was better for a couple days then it returned. He did not get back on irbesartan b/c he felt like it never helped in the first place. Off irbesartan a couple months "b/c it wasn't helping".  States home bp's 865-784O systolic.   Face gets tingling, arms get numb, gets HAs, ears ring. Still taking amlodipine and metoprolol daily. He failed to f/u as instructed after last visit with me 07/2018.  States no alcohol in several months. Still smoking cigs.  PERTINENT ED IMAGING:  CT head w/out contrast 03/27/18: IMPRESSION: 1. No evidence of acute intracranial abnormality 2. Near complete resolution of RIGHT subdural hematoma since the prior study with 2 mm residual collection. No midline shift. 3. Atrophy, chronic small-vessel white matter ischemic changes and remote infarcts as described.  2 view CXR 03/27/18: EXAM: CHEST - 2 VIEW COMPARISON:  08/07/2014 and prior radiographs FINDINGS: The cardiomediastinal silhouette is unremarkable. There is no evidence of focal airspace disease, pulmonary edema, suspicious pulmonary nodule/mass, pleural effusion, or pneumothorax. No acute bony abnormalities are identified. IMPRESSION: No active cardiopulmonary disease.   Bilat LE venous dopplers 03/28/18: NO evidence of DVT on either side.  ROS: no CP, no SOB, no focal weakness, no fevers, no rash, no  melena or hematochezia.  Past Medical History:  Diagnosis Date  . Alcoholism ([place])    Continues to abuse ETOH as of 01/2017; pt has poor insight into his problem.  Pt states that as of 04/2017 he has been 3 monthds sober.  . Anxiety and depression   . Arthritis    "hands, legs" (12/23/2017)  . Chronic joint pain   . COPD (chronic obstructive pulmonary disease) ([place])   . Depression   . Frequent headaches    "depends on BP" (12/23/2017)  . History of kidney stones   . Hypertension   . Insomnia 08/26/2017   08/23/17 ED visit for adverse rxn to taking ambien, PLUS + marijuana on UDS her and in ED---NO FURTHER AMBIEN OR ANY OTHER CONTROLLED SUBSTANCES WILL BE PRESCRIBED BY ME--PM  . Pneumothorax, right   . Polysubstance abuse ([place])   . Poor historian    UNRELIABLE HISTORIAN  . Stroke ([place]) 2012   LUE residual deficit: flexion contracture and weakness of L hand.  Old bilateral cerebral infarcts involving the right frontal and parietal lobes as well as the left occipital lobe.  Baclofen and botox via neuro for L arm contracture.  . Stroke [facility])    "3 mini strokes since 2012" (12/23/2017)  . Subdural hematoma ([place]) 01/29/2017   Subacute right-sided frontal subdural hematoma: found on CT in [place] ED, transferred to [facility] where this was non-operatively managed.  Pt admitted to the MDs at [facility] that he still drinks lots of whisky daily and they  surmised that his frequent falls of late are due to this and frequent use of OTC sleep aids.  Near complete resolution on CT 03/27/18.  . Venous insufficiency of both lower extremities     Past Surgical History:  Procedure Laterality Date  . CHEST TUBE INSERTION    . LUNG SURGERY    . SHOULDER OPEN ROTATOR CUFF REPAIR Bilateral    Multiple rotator cuff surgeries on both sides.  . TONSILLECTOMY    . TYMPANOPLASTY Right   . VIDEO ASSISTED THORACOSCOPY (VATS) W/TALC PLEUADESIS  2010 X2    Outpatient Medications Prior to Visit  Medication Sig Dispense  Refill  . amLODipine (NORVASC) 10 MG tablet Take 1 tablet (10 mg total) by mouth daily. 90 tablet 1  . baclofen (LIORESAL) 10 MG tablet 20 mg tid 180 tablet 3  . Doxylamine Succinate, Sleep, (SLEEP AID PO) Take 1-2 tablets by mouth at bedtime as needed (for sleep).    . DULoxetine (CYMBALTA) 60 MG capsule TAKE 1 CAPSULE (60 MG TOTAL) DAILY BY MOUTH. 90 capsule 0  . irbesartan (AVAPRO) 300 MG tablet Take 1 tablet (300 mg total) by mouth daily. 30 tablet 0  . metoprolol succinate (TOPROL-XL) 50 MG 24 hr tablet Take 1 tablet (50 mg total) by mouth daily. Take with or immediately following a meal. 90 tablet 0  . QUEtiapine (SEROQUEL) 400 MG tablet TAKE 1 TABLET (400 MG TOTAL) BY MOUTH AT BEDTIME. 90 tablet 1  . traMADol (ULTRAM) 50 MG tablet Take 1 tablet (50 mg total) by mouth every 6 (six) hours as needed for moderate pain. 30 tablet 0  . traZODone (DESYREL) 50 MG tablet TAKE 1 TO 2 TABLETS AT BEDTIME AS NEEDED FOR INSOMNIA 180 tablet 0  . baclofen (LIORESAL) 10 MG tablet Take 2 tablets (20 mg total) by mouth 3 (three) times daily. (Patient not taking: Reported on 04/03/2018) 810 each 3  . metroNIDAZOLE (METROGEL) 0.75 % gel Apply to facial rash twice daily (Patient not taking: Reported on 04/03/2018) 45 g 6   No facility-administered medications prior to visit.     Allergies  Allergen Reactions  . Ambien [Zolpidem] Other (See Comments)    Pt took too much, acted out a bad dream/was throwing things, etc--ED visit (07/2017)    ROS As per HPI  PE: Blood pressure 119/69, pulse (!) 57, temperature 98.3 F (36.8 C), temperature source Oral, resp. rate 16, height 5\' 7"  (1.702 m), weight 186 lb 6 oz (84.5 kg), SpO2 97 %. Gen: Alert, well appearing.  Patient is oriented to person, place, time, and situation. Cheeks, nose, nasal creases- erythematous/pinkish rash. CV: RRR, no m/r/g.   LUNGS: CTA bilat, nonlabored resps, good aeration in all lung fields. EXT: L LL 2+ pitting edema.  R LL trace to 1+  pitting edema.  (pt states L always worse than R).  LABS:    Chemistry      Component Value Date/Time   NA 141 03/27/2018 2052   NA 134 (A) 01/29/2017   K 2.9 (L) 03/27/2018 2052   CL 107 03/27/2018 2052   CO2 23 03/27/2018 2052   BUN 15 03/27/2018 2052   BUN 28 (A) 01/29/2017   CREATININE 0.91 03/27/2018 2052      Component Value Date/Time   CALCIUM 8.8 (L) 03/27/2018 2052   ALKPHOS 105 12/21/2017 2209   AST 21 12/21/2017 2209   ALT 14 (L) 12/21/2017 2209   BILITOT 0.6 12/21/2017 2209     Lab Results  Component  Value Date   WBC 7.3 03/27/2018   HGB 13.3 03/27/2018   HCT 39.0 (L) 03/27/2018   MCV 96.5 03/27/2018   PLT 313 03/27/2018   Lab Results  Component Value Date   CKTOTAL 3,316 (H) 03/27/2011   CKMB (HH) 03/25/2011    58.0 CRITICAL VALUE NOTED.  VALUE IS CONSISTENT WITH PREVIOUSLY REPORTED AND CALLED VALUE.   TROPONINI <0.03 03/27/2018   BNP    Component Value Date/Time   BNP 45.0 03/27/2018 2052    IMPRESSION AND PLAN:  1) Uncontrolled HTN. Noncompliance is part of this, ? The accuracy of his home bp montoring (bp in ED recently AND bp here today normal). I think his amlodipine is contributing to his LE edema (superimposed on his chronic venous insuff edema). Stop amlodipine, start lisinopril 20mg  qd, continue toprol xl 50mg  qd. Encouraged pt to get a new bp cuff --upper arm instead of his current wrist cuff.  2) Hypokalemia: given oral K+ in ED, but got lasix as well. Needs recheck BMET today.  3) LE edema: improved.  Suspect combo of chron ven insuff worsening + amlodipine side effect, + hypertensive heart dz effect. No diuretic at this time. Work on BP control and see how getting off amlodipine helps.  An After Visit Summary was printed and given to the patient.  FOLLOW UP: 7-10d  Signed:  Crissie Sickles, MD           04/05/2018

## 2018-04-04 LAB — COMPREHENSIVE METABOLIC PANEL
AG Ratio: 1.4 (calc) (ref 1.0–2.5)
ALBUMIN MSPROF: 4.7 g/dL (ref 3.6–5.1)
ALT: 13 U/L (ref 9–46)
AST: 15 U/L (ref 10–35)
Alkaline phosphatase (APISO): 114 U/L (ref 40–115)
BILIRUBIN TOTAL: 0.4 mg/dL (ref 0.2–1.2)
BUN: 16 mg/dL (ref 7–25)
CALCIUM: 10.1 mg/dL (ref 8.6–10.3)
CO2: 27 mmol/L (ref 20–32)
Chloride: 102 mmol/L (ref 98–110)
Creat: 1.01 mg/dL (ref 0.70–1.33)
Globulin: 3.3 g/dL (calc) (ref 1.9–3.7)
Glucose, Bld: 98 mg/dL (ref 65–99)
POTASSIUM: 4.1 mmol/L (ref 3.5–5.3)
SODIUM: 140 mmol/L (ref 135–146)
TOTAL PROTEIN: 8 g/dL (ref 6.1–8.1)

## 2018-04-11 ENCOUNTER — Other Ambulatory Visit: Payer: Self-pay | Admitting: Family Medicine

## 2018-04-25 ENCOUNTER — Other Ambulatory Visit: Payer: Self-pay | Admitting: Family Medicine

## 2018-04-27 NOTE — Telephone Encounter (Signed)
Pt was to follow up w/ Dr. Anitra Lauth 7-10 days after last ov which was 04/03/18. Will send lisinopril Rx for #30 w/ 0RF. Pt needs to schedule f/u apt w/ Dr. Anitra Lauth for more refills.

## 2018-04-27 NOTE — Telephone Encounter (Signed)
Left message for pt to call back  °

## 2018-05-08 ENCOUNTER — Other Ambulatory Visit: Payer: Self-pay | Admitting: Family Medicine

## 2018-05-08 NOTE — Telephone Encounter (Signed)
[facility]  RF request for trazodone LOV: 04/03/18 Next ov: None Last written: 01/09/18 #180 w/ 0RF  Please advise. Thanks.

## 2018-05-08 NOTE — Telephone Encounter (Signed)
I'll authorize RF.

## 2018-05-22 ENCOUNTER — Encounter

## 2018-05-22 ENCOUNTER — Ambulatory Visit: Payer: Medicare Other | Admitting: Neurology

## 2018-05-24 ENCOUNTER — Other Ambulatory Visit: Payer: Self-pay | Admitting: Family Medicine

## 2018-05-25 NOTE — Telephone Encounter (Signed)
Left message for pt to call back.   Pt is over due for f/u HTN, will send Rx for #14 w/ 0RF. Needs office visit for more refills.

## 2018-05-28 NOTE — Telephone Encounter (Signed)
SW pt, he can only come in on Fridays b/c that is when he can get a ride.   Apt made for 06/12/18 at 1:00pm.

## 2018-05-28 NOTE — Telephone Encounter (Signed)
Left message for pt to call back  °

## 2018-06-09 ENCOUNTER — Other Ambulatory Visit: Payer: Self-pay | Admitting: Family Medicine

## 2018-06-12 ENCOUNTER — Ambulatory Visit (INDEPENDENT_AMBULATORY_CARE_PROVIDER_SITE_OTHER): Payer: Medicare Other | Admitting: Family Medicine

## 2018-06-12 ENCOUNTER — Encounter: Payer: Self-pay | Admitting: Family Medicine

## 2018-06-12 VITALS — BP 171/85 | HR 58 | Temp 98.2°F | Resp 16 | Ht 67.0 in | Wt 183.5 lb

## 2018-06-12 DIAGNOSIS — I10 Essential (primary) hypertension: Secondary | ICD-10-CM | POA: Diagnosis not present

## 2018-06-12 DIAGNOSIS — Z9114 Patient's other noncompliance with medication regimen: Secondary | ICD-10-CM

## 2018-06-12 DIAGNOSIS — N5201 Erectile dysfunction due to arterial insufficiency: Secondary | ICD-10-CM | POA: Diagnosis not present

## 2018-06-12 MED ORDER — SILDENAFIL CITRATE 20 MG PO TABS: ORAL_TABLET | ORAL | 3 refills | Status: DC

## 2018-06-12 MED ORDER — LISINOPRIL 40 MG PO TABS: 40.0000 mg | ORAL_TABLET | Freq: Every day | ORAL | 0 refills | Status: DC

## 2018-06-12 NOTE — Progress Notes (Signed)
OFFICE VISIT  06/12/2018   CC:  Chief Complaint  Patient presents with  . Follow-up    HTN   HPI:    Patient is a 57 y.o. Caucasian male who presents for f/u HTN.  HTN: home bp monitoring shows avg 170s/80s, pays no attention to HR. Not taking metoprolol b/c he thought I told him to stop it. Leg swelling gone since getting off amlodipine.  Has trouble getting erection, more trouble keeping erection.  Says he took viagra in the past and it helped significantly.  ROS: no CP, no SOB, no wheezing, no cough, no dizziness, no HAs, no rashes, no melena/hematochezia.  No polyuria or polydipsia.  No myalgias or arthralgias.  Past Medical History:  Diagnosis Date  . Alcoholism ([place])    Continues to abuse ETOH as of 01/2017; pt has poor insight into his problem.  Pt states that as of 04/2017 he has been 3 monthds sober.  . Anxiety and depression   . Arthritis    "hands, legs" (12/23/2017)  . Chronic joint pain   . COPD (chronic obstructive pulmonary disease) ([place])   . Depression   . Frequent headaches    "depends on BP" (12/23/2017)  . History of kidney stones   . Hypertension   . Insomnia 08/26/2017   08/23/17 ED visit for adverse rxn to taking ambien, PLUS + marijuana on UDS her and in ED---NO FURTHER AMBIEN OR ANY OTHER CONTROLLED SUBSTANCES WILL BE PRESCRIBED BY ME--PM  . Pneumothorax, right   . Polysubstance abuse ([place])   . Poor historian    UNRELIABLE HISTORIAN  . Stroke ([place]) 2012   LUE residual deficit: flexion contracture and weakness of L hand.  Old bilateral cerebral infarcts involving the right frontal and parietal lobes as well as the left occipital lobe.  Baclofen and botox via neuro for L arm contracture.  . Stroke [facility])    "3 mini strokes since 2012" (12/23/2017)  . Subdural hematoma ([place]) 01/29/2017   Subacute right-sided frontal subdural hematoma: found on CT in [place] ED, transferred to [facility] where this was non-operatively managed.  Pt admitted to the MDs at [facility] that  he still drinks lots of whisky daily and they surmised that his frequent falls of late are due to this and frequent use of OTC sleep aids.  Near complete resolution on CT 03/27/18.  . Venous insufficiency of both lower extremities     Past Surgical History:  Procedure Laterality Date  . CHEST TUBE INSERTION    . LUNG SURGERY    . SHOULDER OPEN ROTATOR CUFF REPAIR Bilateral    Multiple rotator cuff surgeries on both sides.  . TONSILLECTOMY    . TYMPANOPLASTY Right   . VIDEO ASSISTED THORACOSCOPY (VATS) W/TALC PLEUADESIS  2010 X2    Outpatient Medications Prior to Visit  Medication Sig Dispense Refill  . baclofen (LIORESAL) 10 MG tablet 20 mg tid 180 tablet 3  . Doxylamine Succinate, Sleep, (SLEEP AID PO) Take 1-2 tablets by mouth at bedtime as needed (for sleep).    . DULoxetine (CYMBALTA) 60 MG capsule TAKE 1 CAPSULE (60 MG TOTAL) DAILY BY MOUTH. 90 capsule 0  . QUEtiapine (SEROQUEL) 400 MG tablet TAKE 1 TABLET (400 MG TOTAL) BY MOUTH AT BEDTIME. 90 tablet 0  . traMADol (ULTRAM) 50 MG tablet Take 1 tablet (50 mg total) by mouth every 6 (six) hours as needed for moderate pain. 30 tablet 0  . traZODone (DESYREL) 50 MG tablet TAKE 1 TO 2 TABLETS BY  MOUTH AT BEDTIME AS NEEDED FOR INSOMNIA 180 tablet 0  . lisinopril (PRINIVIL,ZESTRIL) 20 MG tablet TAKE 1 TABLET (20 MG TOTAL) BY MOUTH DAILY. NEED OFFICE VISIT FOR MORE REFILLS 14 tablet 0  . metoprolol succinate (TOPROL-XL) 50 MG 24 hr tablet Take 1 tablet (50 mg total) by mouth daily. Take with or immediately following a meal. 90 tablet 0   No facility-administered medications prior to visit.     Allergies  Allergen Reactions  . Ambien [Zolpidem] Other (See Comments)    Pt took too much, acted out a bad dream/was throwing things, etc--ED visit (07/2017)    ROS As per HPI  PE: Blood pressure (!) 171/85, pulse (!) 58, temperature 98.2 F (36.8 C), temperature source Oral, resp. rate 16, height 5\' 7"  (1.702 m), weight 183 lb 8 oz (83.2  kg), SpO2 97 %. Body mass index is 28.74 kg/m.  Gen: Alert, well appearing.  Patient is oriented to person, place, time, and situation. AFFECT: pleasant, lucid thought and speech. CV: RRR, no m/r/g.   LUNGS: CTA bilat, nonlabored resps, good aeration in all lung fields. EXT: no clubbing, cyanosis, or edema.    LABS:    Chemistry      Component Value Date/Time   NA 140 04/03/2018 1528   NA 134 (A) 01/29/2017   K 4.1 04/03/2018 1528   CL 102 04/03/2018 1528   CO2 27 04/03/2018 1528   BUN 16 04/03/2018 1528   BUN 28 (A) 01/29/2017   CREATININE 1.01 04/03/2018 1528      Component Value Date/Time   CALCIUM 10.1 04/03/2018 1528   ALKPHOS 105 12/21/2017 2209   AST 15 04/03/2018 1528   ALT 13 04/03/2018 1528   BILITOT 0.4 04/03/2018 1528      IMPRESSION AND PLAN:  1) HTN, uncontrolled.  Noncompliance with med regimen.  Will go ahead and leave him off metoprolol, though, since his HR is in the 50s today OFF of this med. Increase lisinopril to 40mg  qd.  Continue home bp monitoring. His LE edema is completely gone since d/c of amlodipine.  2) ED: generic viagra 20mg , 1-5 qd prn, #50, rF x 3.  An After Visit Summary was printed and given to the patient.  FOLLOW UP: Return in about 3 weeks (around 07/03/2018) for f/u HTN.  Signed:  Crissie Sickles, MD           06/12/2018

## 2018-07-02 ENCOUNTER — Telehealth: Payer: Self-pay | Admitting: Family Medicine

## 2018-07-02 NOTE — Telephone Encounter (Signed)
Copied from [place] [id number]. Topic: Quick Communication - See Telephone Encounter >> Jul 02, 2018  3:04 PM Gardiner Ramus wrote: CRM for notification. See Telephone encounter for: 07/02/18. Pt called and stated that he needs a prior authorization for  sildenafil (REVATIO) 20 MG tablet [[id number]]. Please advise

## 2018-07-02 NOTE — Telephone Encounter (Signed)
Prior authorization submitted.

## 2018-07-08 NOTE — Telephone Encounter (Signed)
Detailed message left on voice mail informing patient that Sildenafil was denied coverage by insurance company.

## 2018-07-09 ENCOUNTER — Telehealth: Payer: Self-pay

## 2018-07-09 NOTE — Telephone Encounter (Signed)
Detailed message left on voice mail for patient to return call to schedule an appointment for follow up on hypertension. Patient was to schedule a 3 week follow up from last visit 06/12/18. Refill request was sent from pharmacy for Lisinopril. Patient instructed on voice mail that we can send in refill to get him by till his next appointment when scheduled.

## 2018-07-09 NOTE — Telephone Encounter (Signed)
Noted agree

## 2018-07-11 ENCOUNTER — Other Ambulatory Visit: Payer: Self-pay | Admitting: Family Medicine

## 2018-07-12 ENCOUNTER — Other Ambulatory Visit: Payer: Self-pay | Admitting: Family Medicine

## 2018-07-13 NOTE — Telephone Encounter (Signed)
RF request for seroquel LOV: 06/12/18 Next ov: None Last written: 04/13/18 #90 w/ 0RF  Please advise. Thanks.

## 2018-07-14 ENCOUNTER — Other Ambulatory Visit: Payer: Self-pay | Admitting: Family Medicine

## 2018-07-20 ENCOUNTER — Telehealth: Payer: Self-pay | Admitting: Family Medicine

## 2018-07-20 MED ORDER — LISINOPRIL 40 MG PO TABS: 40.0000 mg | ORAL_TABLET | Freq: Every day | ORAL | 0 refills | Status: DC

## 2018-07-20 NOTE — Telephone Encounter (Signed)
Copied from [place] [id number]. Topic: General - Other >> Jul 20, 2018  4:06 PM Janace Aris A wrote: Reason for CRM: Patient called in wanting to have  a refill on his medication lisinopril (PRINIVIL,ZESTRIL) 40 Mg. Advised patient he will need to be seen for a Follow up before another refill can be sent and that we should schedule an appointment. Patient says he can not come in for a couple weeks due to his schedule. Patient would like a few to hold him over till his appt. Can this be re-filled?  Please advise  [id number] [place] [place] [facility] [address]  [phone] (Phone) [phone] (Fax)

## 2018-07-21 NOTE — Telephone Encounter (Signed)
Pt notes that his is out of medication. Appt is scheduled for 08/07/2018

## 2018-07-24 ENCOUNTER — Encounter: Payer: Self-pay | Admitting: Neurology

## 2018-07-24 ENCOUNTER — Ambulatory Visit (INDEPENDENT_AMBULATORY_CARE_PROVIDER_SITE_OTHER): Payer: Medicare Other | Admitting: Neurology

## 2018-07-24 VITALS — BP 120/90 | HR 70 | Ht 67.0 in | Wt 180.1 lb

## 2018-07-24 DIAGNOSIS — I679 Cerebrovascular disease, unspecified: Secondary | ICD-10-CM | POA: Diagnosis not present

## 2018-07-24 DIAGNOSIS — G8110 Spastic hemiplegia affecting unspecified side: Secondary | ICD-10-CM

## 2018-07-24 NOTE — Patient Instructions (Addendum)
We will call you when your Botox is approved.  We will send a referral for home PT/OT for stretching  Return to clinic on 10/22 at 8am for Botox.

## 2018-07-24 NOTE — Progress Notes (Signed)
Follow-up Visit   Date: 07/24/18    [name] MRN: [id number] DOB: 05-Jan-1961   Interim History: [name] is a 57 y.o. right-handed Caucasian male with history of R ischemic stroke due to R ICA thrombus (2012, not deemed candidate for anticoagulation), subdural hematoma (01/2017), depression, COPD, tobacco abuse, and alcohol abuse (sober since summer 2018) returning to the clinic for follow-up of left arm spasticity.  The patient was accompanied to the clinic by daughter who also provides collateral information.    History of present illness: Patient had stroke in 2012 manifesting with left arm and leg weakness/numbness due to R ICA thrombus.  Because of medication noncompliance, he was managed with aspirin 81mg  daily, which he took for sometime and then discontinued.  He has residual left sided weakness, especially with the left arm which essentially nonfunctional.  He is able to raise the left arm, but unable to grasp objects due to the finger flexion contracture.  He is here for management options.    In April 2018, he suffered a fall complicated by subdural hematoma for which he was transferred to [facility] and managed conservatively.  He was drinking fifth whiskey nightly after his stroke as a means to forget about his disabilty.  He continued to drink like this for many years and has been sober in 2018.  UPDATE 12/12/2017:   He takes baclofen 10mg  9am, noon, and at bedtime but has not noticed any change and is tolerating it well.  He has tried is own home exercises, but this has not made any difference.  He is interested in Botox injections. Home PT/OT has made multiple attempts to contact patient, but he states that he did not receive any calls and is interested in doing this.    UPDATE 07/24/2018:  He is here for follow-up visit. At the last visit, I offered Botox however patient was unable to return for a visit to get this done.  He is currently taking baclofen 20mg  three times daily  and has not appreciated change in spasticity.  He continues to have marked flexion of the wrist making it difficult to use the left hand for any purpose.  He has been referred to PT/OT in the past, but has not been able to schedule visits.   Medications:  Current Outpatient Medications on File Prior to Visit  Medication Sig Dispense Refill  . baclofen (LIORESAL) 10 MG tablet 20 mg tid 180 tablet 3  . Doxylamine Succinate, Sleep, (SLEEP AID PO) Take 1-2 tablets by mouth at bedtime as needed (for sleep).    . DULoxetine (CYMBALTA) 60 MG capsule TAKE 1 CAPSULE DAILY BY MOUTH. 90 capsule 0  . lisinopril (PRINIVIL,ZESTRIL) 40 MG tablet Take 1 tablet (40 mg total) by mouth daily. 30 tablet 0  . metroNIDAZOLE (METROGEL) 0.75 % gel     . QUEtiapine (SEROQUEL) 400 MG tablet TAKE 1 TABLET (400 MG TOTAL) BY MOUTH AT BEDTIME. 90 tablet 1  . sildenafil (REVATIO) 20 MG tablet 1-5 tabs po qd prn.  Take 30-60 min prior to intercourse 50 tablet 3  . traMADol (ULTRAM) 50 MG tablet Take 1 tablet (50 mg total) by mouth every 6 (six) hours as needed for moderate pain. 30 tablet 0  . traZODone (DESYREL) 50 MG tablet TAKE 1 TO 2 TABLETS BY MOUTH AT BEDTIME AS NEEDED FOR INSOMNIA 180 tablet 0   No current facility-administered medications on file prior to visit.     Allergies:  Allergies  Allergen Reactions  . Ambien [Zolpidem] Other (See Comments)    Pt took too much, acted out a bad dream/was throwing things, etc--ED visit (07/2017)    Review of Systems:  CONSTITUTIONAL: No fevers, chills, night sweats, or weight loss.  EYES: No visual changes or eye pain ENT: No hearing changes.  No history of nose bleeds.   RESPIRATORY: No cough, wheezing and shortness of breath.   CARDIOVASCULAR: Negative for chest pain, and palpitations.   GI: Negative for abdominal discomfort, blood in stools or black stools.  No recent change in bowel habits.   GU:  No history of incontinence.   MUSCLOSKELETAL: No history of joint  pain or swelling.  No myalgias.   SKIN: Negative for lesions, rash, and itching.   ENDOCRINE: Negative for cold or heat intolerance, polydipsia or goiter.   PSYCH:  No depression or anxiety symptoms.   NEURO: As Above.   Vital Signs:  BP 120/90   Pulse 70   Ht 5\' 7"  (1.702 m)   Wt 180 lb 2 oz (81.7 kg)   SpO2 97%   BMI 28.21 kg/m    General: Well appearing, comfortable HEENT:  Erythematous rash over the forehead and face CV:  Regular rate Pulm:  Clear Ext:  Left arm spasticity with flexion   Neurological Exam: MENTAL STATUS including orientation to time, place, person, recent and remote memory, attention span and concentration, language, and fund of knowledge is normal.  Speech is midly dysarthric.  CRANIAL NERVES:  Face is symmetric.   MOTOR:  Marked spasticity of the left arm - biceps, BR, FDP, OP, and PT.  No atrophy, fasciculations or abnormal movements.  No pronator drift.   Right Upper Extremity:    Left Upper Extremity:    Deltoid  5/5   Deltoid  5/5   Biceps  5/5   Biceps  5/5   Triceps  5/5   Triceps  5/5   Wrist extensors  5/5   Wrist extensors  5/5   Wrist flexors  5/5   Wrist flexors  4/5   Finger extensors  5/5   Finger extensors  4/5   Finger flexors  5/5   Finger flexors  4/5   Dorsal interossei  5/5   Dorsal interossei  4/5   Abductor pollicis  5/5   Abductor pollicis  4/5   Tone (Ashworth scale)  0  Tone (Ashworth scale)  1+   Right Lower Extremity:    Left Lower Extremity:    Hip flexors  5/5   Hip flexors  5/5   Hip extensors  5/5   Hip extensors  5/5   Knee flexors  5/5   Knee flexors  5/5   Knee extensors  5/5   Knee extensors  5/5   Dorsiflexors  5/5   Dorsiflexors  5/5   Plantarflexors  5/5   Plantarflexors  5/5   Toe extensors  5/5   Toe extensors  5/5   Toe flexors  5/5   Toe flexors  5/5   Tone (Ashworth scale)  0  Tone (Ashworth scale)  0   COORDINATION/GAIT:    Gait narrow based and stable, left arm is held flexed with fist against  him.  Data: MRI/A brain 02/07/2011: Small areas of acute infarct in the high right frontal lobe and also in the right parietal lobe.  Chronic hemorrhagic infarctions bilaterally.  CT head wo contrast 01/28/2017:  Subdural hematoma on the right with what appears to be subacute  hemorrhage. There is moderate mass effect on the right frontal, temporal, and parietal lobes with a maximum thickness of this subdural hematoma of 12 mm. No midline shift. No other extra-axial fluid. No acute intra-axial hemorrhage evident.  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  He is functionally limited due to clenched fist and unable to use the left hand.  He has been on baclofen 20 mg 3 times daily, with no benefit.  Plan to proceed with botulinum toxin injections - plan for 50 units to biceps, 20 BR, 25 units pronator teres, 20 units FDS, 30 units FDP, 10 units OP.  Start home occupational and physical therapy for spasticity and balance, respectively.  Botox side effects have been discussed.     Thank you for allowing me to participate in patient's care.  If I Robert answer any additional questions, I would be pleased to do so.    Sincerely,    Carlean Crowl K. Posey Pronto, DO

## 2018-07-28 ENCOUNTER — Telehealth: Payer: Self-pay | Admitting: Neurology

## 2018-07-28 NOTE — Telephone Encounter (Signed)
Ben called from [facility] just wanting to let you know that he has reached out to the patient multiple times for Physical Therapy appts and he is not returning the calls. It will delay the start of care. Just FYI. Thanks!

## 2018-07-29 ENCOUNTER — Telehealth: Payer: Self-pay | Admitting: Neurology

## 2018-07-29 NOTE — Telephone Encounter (Signed)
Just FYI: Ben from [facility] called in wanting to let yall know that he tried to reach out to the patient for physical therapy. The patient said that he did not want to do it. If you need to call him back it's [phone]. Thanks!

## 2018-07-30 ENCOUNTER — Telehealth: Payer: Self-pay | Admitting: Neurology

## 2018-07-30 NOTE — Telephone Encounter (Signed)
[name] called and left message with answering service. He needs to let you know that it needs another code G 81.2 Diagnosis added with the other 2 codes. He will be re faxing to the office. Thanks

## 2018-07-30 NOTE — Telephone Encounter (Signed)
Will await fax, as not sure what this message means.

## 2018-08-04 NOTE — Telephone Encounter (Signed)
FYI

## 2018-08-07 ENCOUNTER — Ambulatory Visit (INDEPENDENT_AMBULATORY_CARE_PROVIDER_SITE_OTHER): Payer: Medicare Other | Admitting: Family Medicine

## 2018-08-07 ENCOUNTER — Encounter: Payer: Self-pay | Admitting: Family Medicine

## 2018-08-07 VITALS — BP 110/69 | HR 79 | Temp 97.6°F | Resp 16 | Ht 67.0 in | Wt 177.2 lb

## 2018-08-07 DIAGNOSIS — F5101 Primary insomnia: Secondary | ICD-10-CM | POA: Diagnosis not present

## 2018-08-07 DIAGNOSIS — I10 Essential (primary) hypertension: Secondary | ICD-10-CM | POA: Diagnosis not present

## 2018-08-07 DIAGNOSIS — E663 Overweight: Secondary | ICD-10-CM | POA: Diagnosis not present

## 2018-08-07 LAB — BASIC METABOLIC PANEL WITH GFR
BUN: 20 mg/dL (ref 6–23)
CO2: 29 meq/L (ref 19–32)
Calcium: 9.6 mg/dL (ref 8.4–10.5)
Chloride: 105 meq/L (ref 96–112)
Creatinine, Ser: 1.15 mg/dL (ref 0.40–1.50)
GFR: 69.09 mL/min (ref >60.00)
Glucose, Bld: 110 mg/dL — ABNORMAL HIGH (ref 70–99)
Potassium: 4.4 meq/L (ref 3.5–5.1)
Sodium: 141 meq/L (ref 135–145)

## 2018-08-07 MED ORDER — LISINOPRIL 40 MG PO TABS: 40.0000 mg | ORAL_TABLET | Freq: Every day | ORAL | 1 refills | Status: DC

## 2018-08-07 MED ORDER — SILDENAFIL CITRATE 20 MG PO TABS: ORAL_TABLET | ORAL | 3 refills | Status: DC

## 2018-08-07 MED ORDER — TRAZODONE HCL 50 MG PO TABS: ORAL_TABLET | ORAL | 5 refills | Status: DC

## 2018-08-07 NOTE — Progress Notes (Signed)
OFFICE VISIT  08/07/2018   CC:  Chief Complaint  Patient presents with  . Follow-up    RCI, pt is fasting.    HPI:    Patient is a 57 y.o. Caucasian male who presents for 2 mo f/u uncontrolled HTN. Last visit's plan: "Noncompliance with med regimen.  Will go ahead and leave him off metoprolol, though, since his HR is in the 50s today OFF of this med. Increase lisinopril to 40mg  qd.  Continue home bp monitoring. His LE edema is completely gone since d/c of amlodipine". He was to f/u 3 weeks later but did not.  Says he feels well.  No acute complaints. Says home bp's consistently 120/70s and he checks it every day. He thinks HR is around low 60s.  Says he can get to sleep pretty well on current regimen of quetiapine and trazodone, but maintaining sleep is still problematic.  Says the trazodone "is like taking candy"---doesn't notice any effect.  ROS: no CP, no SOB, no wheezing, no cough, no dizziness, no HAs, no rashes, no melena/hematochezia.  No polyuria or polydipsia.  No myalgias or arthralgias.   Past Medical History:  Diagnosis Date  . Alcoholism ([place])    Continues to abuse ETOH as of 01/2017; pt has poor insight into his problem.  Pt states that as of 04/2017 he has been 3 monthds sober.  . Anxiety and depression   . Arthritis    "hands, legs" (12/23/2017)  . Chronic joint pain   . COPD (chronic obstructive pulmonary disease) ([place])   . Depression   . Frequent headaches    "depends on BP" (12/23/2017)  . History of kidney stones   . Hypertension   . Insomnia 08/26/2017   08/23/17 ED visit for adverse rxn to taking ambien, PLUS + marijuana on UDS her and in ED---NO FURTHER AMBIEN OR ANY OTHER CONTROLLED SUBSTANCES WILL BE PRESCRIBED BY ME--PM  . Pneumothorax, right   . Polysubstance abuse ([place])   . Poor historian    UNRELIABLE HISTORIAN  . Stroke ([place]) 2012   LUE residual deficit: flexion contracture and weakness of L hand.  Old bilateral cerebral infarcts involving  the right frontal and parietal lobes as well as the left occipital lobe.  Baclofen and botox via neuro for L arm contracture.  . Stroke [facility])    "3 mini strokes since 2012" (12/23/2017)  . Subdural hematoma ([place]) 01/29/2017   Subacute right-sided frontal subdural hematoma: found on CT in [place] ED, transferred to [facility] where this was non-operatively managed.  Pt admitted to the MDs at [facility] that he still drinks lots of whisky daily and they surmised that his frequent falls of late are due to this and frequent use of OTC sleep aids.  Near complete resolution on CT 03/27/18.  . Venous insufficiency of both lower extremities     Past Surgical History:  Procedure Laterality Date  . CHEST TUBE INSERTION    . LUNG SURGERY    . SHOULDER OPEN ROTATOR CUFF REPAIR Bilateral    Multiple rotator cuff surgeries on both sides.  . TONSILLECTOMY    . TYMPANOPLASTY Right   . VIDEO ASSISTED THORACOSCOPY (VATS) W/TALC PLEUADESIS  2010 X2    Outpatient Medications Prior to Visit  Medication Sig Dispense Refill  . baclofen (LIORESAL) 10 MG tablet 20 mg tid 180 tablet 3  . Doxylamine Succinate, Sleep, (SLEEP AID PO) Take 1-2 tablets by mouth at bedtime as needed (for sleep).    . DULoxetine (CYMBALTA) 60  MG capsule TAKE 1 CAPSULE DAILY BY MOUTH. 90 capsule 0  . QUEtiapine (SEROQUEL) 400 MG tablet TAKE 1 TABLET (400 MG TOTAL) BY MOUTH AT BEDTIME. 90 tablet 1  . traMADol (ULTRAM) 50 MG tablet Take 1 tablet (50 mg total) by mouth every 6 (six) hours as needed for moderate pain. 30 tablet 0  . lisinopril (PRINIVIL,ZESTRIL) 40 MG tablet Take 1 tablet (40 mg total) by mouth daily. 30 tablet 0  . sildenafil (REVATIO) 20 MG tablet 1-5 tabs po qd prn.  Take 30-60 min prior to intercourse 50 tablet 3  . traZODone (DESYREL) 50 MG tablet TAKE 1 TO 2 TABLETS BY MOUTH AT BEDTIME AS NEEDED FOR INSOMNIA 180 tablet 0  . metroNIDAZOLE (METROGEL) 0.75 % gel      No facility-administered medications prior to visit.     Allergies   Allergen Reactions  . Ambien [Zolpidem] Other (See Comments)    Pt took too much, acted out a bad dream/was throwing things, etc--ED visit (07/2017)    ROS As per HPI  PE: Blood pressure 110/69, pulse 79, temperature 97.6 F (36.4 C), temperature source Oral, resp. rate 16, height 5\' 7"  (1.702 m), weight 177 lb 4 oz (80.4 kg), SpO2 96 %. Gen: Alert, well appearing.  Patient is oriented to person, place, time, and situation. AFFECT: pleasant, lucid thought and speech. CV: RRR, no m/r/g.   LUNGS: CTA bilat, nonlabored resps, good aeration in all lung fields. EXT: no clubbing or cyanosis.  no edema.    LABS:    Chemistry      Component Value Date/Time   NA 140 04/03/2018 1528   NA 134 (A) 01/29/2017   K 4.1 04/03/2018 1528   CL 102 04/03/2018 1528   CO2 27 04/03/2018 1528   BUN 16 04/03/2018 1528   BUN 28 (A) 01/29/2017   CREATININE 1.01 04/03/2018 1528      Component Value Date/Time   CALCIUM 10.1 04/03/2018 1528   ALKPHOS 105 12/21/2017 2209   AST 15 04/03/2018 1528   ALT 13 04/03/2018 1528   BILITOT 0.4 04/03/2018 1528     Lab Results  Component Value Date   CHOL 112 12/20/2016   HDL 54 12/20/2016   LDLCALC 22 12/20/2016   TRIG 178 (H) 12/20/2016   CHOLHDL 2.1 12/20/2016    IMPRESSION AND PLAN:  1) HTN: The current medical regimen is effective;  continue present plan and medications. BMET today.  2) Insomnia: continue quetiapine 400 mg qhs (I won't go higher than this dose for insomnia) and will increase trazodone to 3-4 tabs qhs since 2 doesn't seem to help any.   An After Visit Summary was printed and given to the patient.  FOLLOW UP: Return in about 6 months (around 02/06/2019) for routine chronic illness f/u.  Signed:  Crissie Sickles, MD           08/07/2018

## 2018-08-17 ENCOUNTER — Other Ambulatory Visit: Payer: Self-pay | Admitting: Family Medicine

## 2018-08-18 ENCOUNTER — Ambulatory Visit (INDEPENDENT_AMBULATORY_CARE_PROVIDER_SITE_OTHER): Payer: Medicare Other | Admitting: Neurology

## 2018-08-18 DIAGNOSIS — I69334 Monoplegia of upper limb following cerebral infarction affecting left non-dominant side: Secondary | ICD-10-CM

## 2018-08-18 DIAGNOSIS — G8110 Spastic hemiplegia affecting unspecified side: Secondary | ICD-10-CM | POA: Diagnosis not present

## 2018-08-18 DIAGNOSIS — I679 Cerebrovascular disease, unspecified: Secondary | ICD-10-CM | POA: Diagnosis not present

## 2018-08-18 MED ORDER — INCOBOTULINUMTOXINA 100 UNITS IM SOLR
100.0000 [IU] | Freq: Once | INTRAMUSCULAR | Status: AC
  Administered 2018-08-18: 100 [IU] via INTRAMUSCULAR

## 2018-08-18 MED ORDER — INCOBOTULINUMTOXINA 100 UNITS IM SOLR
100.0000 [IU] | INTRAMUSCULAR | Status: DC
  Administered 2018-08-18: 100 [IU] via INTRAMUSCULAR

## 2018-08-18 NOTE — Procedures (Signed)
[facility]   Procedure Note:  Botulinum toxin injection  Attending: Dr. Narda Amber  Preoperative Diagnosis(es): Spastic hemiparesis  Consent obtained from: The patient Benefits discussed included, but were not limited to decreased muscle tightness, increased joint range of motion, and decreased pain.  Risk discussed included, but were not limited pain and discomfort, bleeding, bruising, excessive weakness, venous thrombosis, muscle atrophy and dysphagia.  Anticipated outcomes of the procedure as well as he risks and benefits of the alternatives to the procedure, and the roles and tasks of the personnel to be involved, were discussed with the patient, and the patient consents to the procedure and agrees to proceed. A copy of the patient medication guide was given to the patient which explains the blackbox warning.  Patients identity and treatment sites confirmed Yes.  .  Details of Procedure: Skin was cleaned with alcohol. Prior to injection, the needle plunger was aspirated to make sure the needle was not within a blood vessel.  There was no blood retrieved on aspiration.    Agent and dilution:  200 units of botulinum Type A (incobotulinum Toxin type A) was reconstituted with 2 ml of preservative free normal saline.  Time of reconstitution: At the time of the office visit (<30 minutes prior to injection)   Following is a summary of the muscles injected and the amount of botulinum toxin used: 155 total units of Botox was injected with a 30 gauge needle using EMG guidance.  L biceps:  40 units - 2 sites L brachioradialis 20 units - 1 site L pronator teres:  30 units - 1 site L FDS: 25 units - 1 site L FDP:  30 units - 1 site L opponens pollicis:  10 units - 1 site    Total injected (Units): 155  Total wasted (Units): 45   Patient tolerated procedure well without complications.   Reinjection is anticipated in 3 months. Return to clinic in 6-weeks.

## 2018-08-19 ENCOUNTER — Other Ambulatory Visit: Payer: Self-pay | Admitting: Family Medicine

## 2018-08-20 DIAGNOSIS — I679 Cerebrovascular disease, unspecified: Secondary | ICD-10-CM | POA: Diagnosis not present

## 2018-08-20 DIAGNOSIS — G8110 Spastic hemiplegia affecting unspecified side: Secondary | ICD-10-CM | POA: Diagnosis not present

## 2018-08-20 DIAGNOSIS — I69334 Monoplegia of upper limb following cerebral infarction affecting left non-dominant side: Secondary | ICD-10-CM | POA: Diagnosis not present

## 2018-08-20 MED ORDER — INCOBOTULINUMTOXINA 100 UNITS IM SOLR
200.0000 [IU] | Freq: Once | INTRAMUSCULAR | Status: AC
  Administered 2018-08-20: 200 [IU] via INTRAMUSCULAR

## 2018-08-20 NOTE — Addendum Note (Signed)
Addended byAnnamaria Helling on: 08/20/2018 02:07 PM   Modules accepted: Orders

## 2018-09-03 ENCOUNTER — Other Ambulatory Visit: Payer: Self-pay | Admitting: Family Medicine

## 2018-09-03 ENCOUNTER — Telehealth: Payer: Self-pay | Admitting: Neurology

## 2018-09-03 NOTE — Telephone Encounter (Signed)
Wants to talk to someone about therapy coming to his home for his hand

## 2018-09-03 NOTE — Telephone Encounter (Signed)
RF request for trazodone LOV: 08/07/18 Next ov: None Last written: 08/07/18 #120 w/ 5RF  Pharmacy send Rx request for 90 day supply.  Please advise. Thanks.

## 2018-09-04 ENCOUNTER — Encounter: Payer: Self-pay | Admitting: Family Medicine

## 2018-10-12 ENCOUNTER — Other Ambulatory Visit: Payer: Self-pay | Admitting: Family Medicine

## 2018-10-12 DIAGNOSIS — R410 Disorientation, unspecified: Secondary | ICD-10-CM | POA: Diagnosis not present

## 2018-10-26 ENCOUNTER — Encounter: Payer: Self-pay | Admitting: Neurology

## 2018-10-26 ENCOUNTER — Other Ambulatory Visit: Payer: Self-pay | Admitting: *Deleted

## 2018-10-26 ENCOUNTER — Ambulatory Visit (INDEPENDENT_AMBULATORY_CARE_PROVIDER_SITE_OTHER): Payer: Medicare Other | Admitting: Neurology

## 2018-10-26 VITALS — BP 150/90 | HR 68 | Ht 67.0 in | Wt 176.5 lb

## 2018-10-26 DIAGNOSIS — I679 Cerebrovascular disease, unspecified: Secondary | ICD-10-CM | POA: Diagnosis not present

## 2018-10-26 DIAGNOSIS — G8110 Spastic hemiplegia affecting unspecified side: Secondary | ICD-10-CM

## 2018-10-26 MED ORDER — CYCLOBENZAPRINE HCL 5 MG PO TABS: 5.0000 mg | ORAL_TABLET | Freq: Every day | ORAL | 1 refills | Status: DC

## 2018-10-26 MED ORDER — CYCLOBENZAPRINE HCL 5 MG PO TABS: 5.0000 mg | ORAL_TABLET | Freq: Every day | ORAL | 5 refills | Status: DC

## 2018-10-26 NOTE — Progress Notes (Signed)
Follow-up Visit   Date: 10/26/18    [name] MRN: [id number] DOB: 15-Jan-1961   Interim History: [name] is a 57 y.o. right-handed Caucasian male with history of R ischemic stroke due to R ICA thrombus (2012, not deemed candidate for anticoagulation), subdural hematoma (01/2017), depression, COPD, tobacco abuse, and alcohol abuse (sober since summer 2018) returning to the clinic for follow-up of left arm spasticity.  The patient was accompanied to the clinic by daughter who also provides collateral information.    History of present illness: Patient had stroke in 2012 manifesting with left arm and leg weakness/numbness due to R ICA thrombus.  Because of medication noncompliance, he was managed with aspirin 81mg  daily, which he took for sometime and then discontinued.  He has residual left sided weakness, especially with the left arm which essentially nonfunctional.  He is able to raise the left arm, but unable to grasp objects due to the finger flexion contracture.    He was referred here for further management and started on baclofen which was titrated to 20 mg 3 times daily, however he did not have any benefit.   In April 2018, he suffered a fall complicated by subdural hematoma for which he was transferred to [facility] and managed conservatively.  He was drinking fifth whiskey nightly after his stroke as a means to forget about his disabilty.  He continued to drink like this for many years and has been sober in 2018.  UPDATE 10/26/2018:  He is here for follow-up visit.  He had Botox injection to the left arm with 155 units on 10/22 and did not appreciate any benefit.  He does not have side effects of weakness in the left arm and tolerated the injections well.  He self-discontinued baclofen because of no improvement, despite my recommendations to avoid stopping this medication abruptly.  He denies having any withdrawal side effects to baclofen.  Medications:  Current Outpatient  Medications on File Prior to Visit  Medication Sig Dispense Refill  . Doxylamine Succinate, Sleep, (SLEEP AID PO) Take 1-2 tablets by mouth at bedtime as needed (for sleep).    . DULoxetine (CYMBALTA) 60 MG capsule TAKE 1 CAPSULE DAILY BY MOUTH. 90 capsule 0  . lisinopril (PRINIVIL,ZESTRIL) 40 MG tablet Take 1 tablet (40 mg total) by mouth daily. 90 tablet 1  . QUEtiapine (SEROQUEL) 400 MG tablet TAKE 1 TABLET (400 MG TOTAL) BY MOUTH AT BEDTIME. 90 tablet 1  . sildenafil (REVATIO) 20 MG tablet 1-5 tabs po qd prn.  Take 30-60 min prior to intercourse 50 tablet 3  . traMADol (ULTRAM) 50 MG tablet Take 1 tablet (50 mg total) by mouth every 6 (six) hours as needed for moderate pain. 30 tablet 0  . traZODone (DESYREL) 50 MG tablet TAKE 3-4 TABLETS BY MOUTH AT BEDTIME AS NEEDED FOR INSOMNIA 360 tablet 1   No current facility-administered medications on file prior to visit.     Allergies:  Allergies  Allergen Reactions  . Ambien [Zolpidem] Other (See Comments)    Pt took too much, acted out a bad dream/was throwing things, etc--ED visit (07/2017)    Review of Systems:  CONSTITUTIONAL: No fevers, chills, night sweats, or weight loss.  EYES: No visual changes or eye pain ENT: No hearing changes.  No history of nose bleeds.   RESPIRATORY: No cough, wheezing and shortness of breath.   CARDIOVASCULAR: Negative for chest pain, and palpitations.   GI: Negative for abdominal discomfort, blood in stools  or black stools.  No recent change in bowel habits.   GU:  No history of incontinence.   MUSCLOSKELETAL: No history of joint pain or swelling.  No myalgias.   SKIN: Negative for lesions, rash, and itching.   ENDOCRINE: Negative for cold or heat intolerance, polydipsia or goiter.   PSYCH:  No depression or anxiety symptoms.   NEURO: As Above.   Vital Signs:  BP (!) 150/90   Pulse 68   Ht 5\' 7"  (1.702 m)   Wt 176 lb 8 oz (80.1 kg)   SpO2 97%   BMI 27.64 kg/m    General Medical Exam:     General:  Well appearing, comfortable  Eyes/ENT: see cranial nerve examination.   Neck: No masses appreciated.  Full range of motion without tenderness.  No carotid bruits. Respiratory:  Clear to auscultation, good air entry bilaterally.   Cardiac:  Regular rate and rhythm, no murmur.   Ext:  Left arm spasticity  Neurological Exam: MENTAL STATUS including orientation to time, place, person, recent and remote memory, attention span and concentration, language, and fund of knowledge is normal.  Speech is midly dysarthric.  CRANIAL NERVES:  Face is symmetric.   MOTOR:  Marked spasticity of the left arm - biceps, BR, FDP, OP, and PT.  No atrophy, fasciculations or abnormal movements.  No pronator drift.   Right Upper Extremity:    Left Upper Extremity:    Deltoid  5/5   Deltoid  5/5   Biceps  5/5   Biceps  5/5   Triceps  5/5   Triceps  5/5   Wrist extensors  5/5   Wrist extensors  5/5   Wrist flexors  5/5   Wrist flexors  4/5   Finger extensors  5/5   Finger extensors  4/5   Finger flexors  5/5   Finger flexors  4/5   Dorsal interossei  5/5   Dorsal interossei  4/5   Abductor pollicis  5/5   Abductor pollicis  4/5   Tone (Ashworth scale)  0  Tone (Ashworth scale)  1+   Right Lower Extremity:    Left Lower Extremity:    Hip flexors  5/5   Hip flexors  5/5   Hip extensors  5/5   Hip extensors  5/5   Knee flexors  5/5   Knee flexors  5/5   Knee extensors  5/5   Knee extensors  5/5   Dorsiflexors  5/5   Dorsiflexors  5/5   Plantarflexors  5/5   Plantarflexors  5/5   Toe extensors  5/5   Toe extensors  5/5   Toe flexors  5/5   Toe flexors  5/5   Tone (Ashworth scale)  0  Tone (Ashworth scale)  0   COORDINATION/GAIT:    Gait narrow based and stable, left upper arm is flexed and abducted against him.    Data: MRI/A brain 02/07/2011: Small areas of acute infarct in the high right frontal lobe and also in the right parietal lobe.  Chronic hemorrhagic infarctions bilaterally.  CT  head wo contrast 01/28/2017:  Subdural hematoma on the right with what appears to be subacute hemorrhage. There is moderate mass effect on the right frontal, temporal, and parietal lobes with a maximum thickness of this subdural hematoma of 12 mm. No midline shift. No other extra-axial fluid. No acute intra-axial hemorrhage evident.  IMPRESSION/PLAN: Left arm spasticity due to right MCA ischemic stroke (2012).  He did not have  any benefit with first trial of Botox, which consisted of 155 units, so I will increase the dose to 200 units.  Plan to proceed with 75 units to biceps, 25 units to brachioradialis, 30 units to pronator teres, 40 units to FDS, and 40 units to FDP.  Encouraged him to continue home stretching exercises. He would like to try a different muscle relaxant in the meantime, so I will start Flexeril 5 mg at bedtime.  I explained to him that overall benefit with this medication may be low.  Return to clinic in 1 month for reinjection   Thank you for allowing me to participate in patient's care.  If I can answer any additional questions, I would be pleased to do so.    Sincerely,    Antonious Omahoney K. Posey Pronto, DO

## 2018-10-26 NOTE — Patient Instructions (Addendum)
Start cyclobenzaprine 5mg  at bedtime  Return to clinic on January 30th at 3pm for Botox

## 2018-11-13 ENCOUNTER — Telehealth: Payer: Self-pay | Admitting: Family Medicine

## 2018-11-13 NOTE — Telephone Encounter (Signed)
Copied from [place] [phone]. Topic: General - Other >> Nov 13, 2018 12:53 PM Lennox Solders wrote: Reason for CRM: Pt has an appt on 11-26-2018 at 3 pm with neurologist dr patel that dr Anitra Lauth referred the pt to. Pt need on our letterhead for transportation purposes the   date and time of his appt. Please fax to [phone] attn social services. Pt said the letter must come from his PCP

## 2018-11-13 NOTE — Telephone Encounter (Signed)
Faxed referral and appointment information per patient request.

## 2018-11-18 ENCOUNTER — Telehealth: Payer: Self-pay | Admitting: Family Medicine

## 2018-11-18 NOTE — Telephone Encounter (Signed)
Pt called back and stated that he needs a letter from Dr. Anitra Lauth stating that he is disable and needs transportation to Dr. Ena Dawley office on 11/26/18.   Pt advised Dr. Anitra Lauth is out of the office today.

## 2018-11-19 ENCOUNTER — Encounter: Payer: Self-pay | Admitting: Family Medicine

## 2018-11-19 NOTE — Telephone Encounter (Signed)
Patient is calling and states that he is going out of town on 12/06/18 and is wanting a refill to take with him as he will be out of town for over a month.

## 2018-11-19 NOTE — Telephone Encounter (Signed)
Patient is calling to follow up on this.   [phone]

## 2018-11-19 NOTE — Telephone Encounter (Signed)
Pt advised and voiced understanding.    Letter faxed to number below as requested.

## 2018-11-19 NOTE — Telephone Encounter (Signed)
Letter printed.

## 2018-11-19 NOTE — Telephone Encounter (Signed)
Per our record pt is not due for refill until March. Advised pt to contact us in March before he runs our for refills. Pt voiced understanding.

## 2018-11-19 NOTE — Telephone Encounter (Signed)
I advised pt that Dr. Anitra Lauth has been seeing pts this morning/afternoon and has not had time to write the letter. We will contact him when this has been done. Pt voiced understanding.

## 2018-11-20 ENCOUNTER — Telehealth: Payer: Self-pay | Admitting: Neurology

## 2018-11-20 NOTE — Telephone Encounter (Signed)
Noted  

## 2018-11-20 NOTE — Telephone Encounter (Signed)
Patient is going to try to keep appt on 11-26-18

## 2018-11-20 NOTE — Telephone Encounter (Signed)
Will you please call him back and tell him this is all we have to offer?

## 2018-11-20 NOTE — Telephone Encounter (Signed)
Patient is calling in about appt on 1/30 saying that the medicaid transport only runs from 8:30-1 and he wants to see if there is anywhere we can work him in for the Botox. He doesn't want to wait forever because he said his hand is already in pain. Please let us know where to schedule or call him back at [phone]. Thanks!

## 2018-11-20 NOTE — Telephone Encounter (Signed)
There is no availability during his requested time period.  The only option is to use Wednesday follow-up - ok to use 2/5 at 10:10am for Botox visit.

## 2018-11-20 NOTE — Telephone Encounter (Signed)
Patient needs to come in for the Botox on a Tuesday or Thursday  Between 8:30 and 1:00 due to ride. He would like to keep the appt on 1-30. Please Advise  He can not do the 12-02-18

## 2018-11-20 NOTE — Telephone Encounter (Signed)
Please advise 

## 2018-11-23 ENCOUNTER — Encounter: Payer: Self-pay | Admitting: Family Medicine

## 2018-11-26 ENCOUNTER — Ambulatory Visit: Payer: Medicare Other | Admitting: Family Medicine

## 2018-11-26 ENCOUNTER — Ambulatory Visit (INDEPENDENT_AMBULATORY_CARE_PROVIDER_SITE_OTHER): Payer: Medicare Other | Admitting: Neurology

## 2018-11-26 DIAGNOSIS — I679 Cerebrovascular disease, unspecified: Secondary | ICD-10-CM | POA: Diagnosis not present

## 2018-11-26 DIAGNOSIS — G8110 Spastic hemiplegia affecting unspecified side: Secondary | ICD-10-CM | POA: Diagnosis not present

## 2018-11-26 DIAGNOSIS — I69334 Monoplegia of upper limb following cerebral infarction affecting left non-dominant side: Secondary | ICD-10-CM | POA: Diagnosis not present

## 2018-11-26 MED ORDER — ONABOTULINUMTOXINA 100 UNITS IJ SOLR
200.0000 [IU] | Freq: Once | INTRAMUSCULAR | Status: AC
  Administered 2018-11-26: 200 [IU] via INTRAMUSCULAR

## 2018-11-26 NOTE — Progress Notes (Signed)
[facility]ulinum Clinic   Procedure Note:  Botulinum toxin injection -  Left upper extremity  Attending: Dr. Narda Amber  Preoperative Diagnosis(es): Spastic hemiparesis  Consent obtained from: The patient Benefits discussed included, but were not limited to decreased muscle tightness, increased joint range of motion, and decreased pain.  Risk discussed included, but were not limited pain and discomfort, bleeding, bruising, excessive weakness, venous thrombosis, muscle atrophy and dysphagia.  Anticipated outcomes of the procedure as well as he risks and benefits of the alternatives to the procedure, and the roles and tasks of the personnel to be involved, were discussed with the patient, and the patient consents to the procedure and agrees to proceed. A copy of the patient medication guide was given to the patient which explains the blackbox warning.  Patients identity and treatment sites confirmed Yes.  .  Details of Procedure: Skin was cleaned with alcohol. Prior to injection, the needle plunger was aspirated to make sure the needle was not within a blood vessel.  There was no blood retrieved on aspiration.    Agent and dilution:  200 units of botulinum Type A (incobotulinum Toxin type A) was reconstituted with 2 ml of preservative free normal saline.  Time of reconstitution: At the time of the office visit (<30 minutes prior to injection)   Following is a summary of the muscles injected and the amount of botulinum toxin used: 190 total units of Botox was injected with a 30 gauge needle using EMG guidance.  L biceps:  50 units - 2 sites L pronator teres:  30 units - 1 site L FDS: 40 units - 1 site L FDP:  35 units - 2 site L FCU  25 units - 1 site L opponens pollicis:  10 units - 1 site    Total injected (Units): 190  Total wasted (Units):  10    Patient tolerated procedure well without complications.   Reinjection is anticipated in 3 months.

## 2018-12-31 NOTE — Progress Notes (Signed)
Follow-up Visit   Date: 01/01/19    [name] MRN: [id number] DOB: [birth year]   Interim History: [name] is a 58 y.o. right-handed Caucasian male with history of R ischemic stroke due to R ICA thrombus (2012, not deemed candidate for anticoagulation), subdural hematoma (01/2017), depression, COPD, tobacco abuse, and alcohol abuse (sober since summer 2018) returning to the clinic for follow-up of left arm spasticity.  The patient was accompanied to the clinic by daughter who also provides collateral information.    History of present illness: Patient had stroke in 2012 manifesting with left arm and leg weakness/numbness due to R ICA thrombus.  Because of medication noncompliance, he was managed with aspirin 81mg  daily, which he took for sometime and then discontinued.  He has residual left sided weakness, especially with the left arm which essentially nonfunctional.  He is able to raise the left arm, but unable to grasp objects due to the finger flexion contracture.    He was referred here for further management and started on baclofen which was titrated to 20 mg 3 times daily, however he did not have any benefit.   In April 2018, he suffered a fall complicated by subdural hematoma for which he was transferred to [facility] and managed conservatively.  He was drinking fifth whiskey nightly after his stroke as a means to forget about his disabilty.  He continued to drink like this for many years and has been sober in 2018.  UPDATE 10/26/2018:   He had Botox injection to the left arm with 155 units on 10/22 and did not appreciate any benefit.  He self-discontinued baclofen because of no improvement, despite my recommendations to avoid stopping this medication abruptly.  He denies having any withdrawal side effects to baclofen.  UPDATE 01/01/2019:  He is here for follow-up visit.  He had Botox injection on 11/26/2018 with 190 units and tolerated it well.  He has reduced stiffness of the upper arm,  but continues to have stiffness of the hand.  He feels that it needs surgery to "break the bones and put them back together".  He is disappointed that none of the therapies are making his hand stronger and functional.    Medications:  Current Outpatient Medications on File Prior to Visit  Medication Sig Dispense Refill  . Doxylamine Succinate, Sleep, (SLEEP AID PO) Take 1-2 tablets by mouth at bedtime as needed (for sleep).    . DULoxetine (CYMBALTA) 60 MG capsule TAKE 1 CAPSULE DAILY BY MOUTH. 90 capsule 0  . lisinopril (PRINIVIL,ZESTRIL) 40 MG tablet Take 1 tablet (40 mg total) by mouth daily. 90 tablet 1  . QUEtiapine (SEROQUEL) 400 MG tablet TAKE 1 TABLET (400 MG TOTAL) BY MOUTH AT BEDTIME. 90 tablet 1  . sildenafil (REVATIO) 20 MG tablet 1-5 tabs po qd prn.  Take 30-60 min prior to intercourse 50 tablet 3  . traMADol (ULTRAM) 50 MG tablet Take 1 tablet (50 mg total) by mouth every 6 (six) hours as needed for moderate pain. 30 tablet 0  . traZODone (DESYREL) 50 MG tablet TAKE 3-4 TABLETS BY MOUTH AT BEDTIME AS NEEDED FOR INSOMNIA 360 tablet 1   No current facility-administered medications on file prior to visit.     Allergies:  Allergies  Allergen Reactions  . Ambien [Zolpidem] Other (See Comments)    Pt took too much, acted out a bad dream/was throwing things, etc--ED visit (07/2017)    Review of Systems:  CONSTITUTIONAL: No fevers, chills, night  sweats, or weight loss.  EYES: No visual changes or eye pain ENT: No hearing changes.  No history of nose bleeds.   RESPIRATORY: No cough, wheezing and shortness of breath.   CARDIOVASCULAR: Negative for chest pain, and palpitations.   GI: Negative for abdominal discomfort, blood in stools or black stools.  No recent change in bowel habits.   GU:  No history of incontinence.   MUSCLOSKELETAL: No history of joint pain or swelling.  No myalgias.   SKIN: Negative for lesions, rash, and itching.   ENDOCRINE: Negative for cold or heat  intolerance, polydipsia or goiter.   PSYCH:  No depression or anxiety symptoms.   NEURO: As Above.   Vital Signs:  BP 120/86   Pulse 66   Ht 5\' 8"  (1.727 m)   Wt 180 lb 2 oz (81.7 kg)   SpO2 98%   BMI 27.39 kg/m    General Medical Exam:   General:  Well appearing, comfortable  Eyes/ENT: see cranial nerve examination.   Neck: No masses appreciated.  Full range of motion without tenderness.  No carotid bruits. Respiratory:  Clear to auscultation, good air entry bilaterally.   Cardiac:  Regular rate and rhythm, no murmur.   Ext:  Left forearm and hand spasticity  Neurological Exam: MENTAL STATUS including orientation to time, place, person, recent and remote memory, attention span and concentration, language, and fund of knowledge is normal.  Speech is midly dysarthric.  CRANIAL NERVES:  Face is symmetric.   MOTOR:  Improved spasticity at the left biceps (0+) and pronator teres (0+), he continues to have spasticity at the finger >> wrist flexors (1). Right Upper Extremity:    Left Upper Extremity:    Deltoid  5/5   Deltoid  5/5   Biceps  5/5   Biceps  5/5   Triceps  5/5   Triceps  5/5   Wrist extensors  5/5   Wrist extensors  5/5   Wrist flexors  5/5   Wrist flexors  4/5   Finger extensors  5/5   Finger extensors  4/5   Finger flexors  5/5   Finger flexors  4/5   Dorsal interossei  5/5   Dorsal interossei  4/5   Abductor pollicis  5/5   Abductor pollicis  4/5    COORDINATION/GAIT:    Gait narrow based and stable, left arm his extended and not abducted against him, as previously seen   Data: MRI/A brain 02/07/2011: Small areas of acute infarct in the high right frontal lobe and also in the right parietal lobe.  Chronic hemorrhagic infarctions bilaterally.  CT head wo contrast 01/28/2017:  Subdural hematoma on the right with what appears to be subacute hemorrhage. There is moderate mass effect on the right frontal, temporal, and parietal lobes with a maximum thickness of  this subdural hematoma of 12 mm. No midline shift. No other extra-axial fluid. No acute intra-axial hemorrhage evident.  IMPRESSION/PLAN: Left arm spasticity due to right MCA ischemic stroke (2012).  He has second course of botox on 1/30 and exam shows reduced spasticity at the biceps and brachioradialis.  There is still increased tone in the forearm and hands.  I will plan to increase botox to full 200 units at the next injection with extra at the FDP.  Start home OT for hand spasticity and PT for gait training.  Patient was informed that his expectations for full recovery are not attainable and there is no role for hand surgery in  his care.  He wants complete function of the left arm and is disappointed that none of the medications are achieving this.  I explained that he will have left hand weakness as residual effects of stroke and his stiffness is due to spasticity which is managed with botox and stretching exercises.  He has not been compliant with home exercises.   Next injection to be after 4/30.     Thank you for allowing me to participate in patient's care.  If I can answer any additional questions, I would be pleased to do so.    Sincerely,    Marjarie Irion K. Posey Pronto, DO

## 2019-01-01 ENCOUNTER — Encounter

## 2019-01-01 ENCOUNTER — Ambulatory Visit (INDEPENDENT_AMBULATORY_CARE_PROVIDER_SITE_OTHER): Payer: Medicare Other | Admitting: Neurology

## 2019-01-01 ENCOUNTER — Encounter: Payer: Self-pay | Admitting: Neurology

## 2019-01-01 VITALS — BP 120/86 | HR 66 | Ht 68.0 in | Wt 180.1 lb

## 2019-01-01 DIAGNOSIS — I679 Cerebrovascular disease, unspecified: Secondary | ICD-10-CM | POA: Diagnosis not present

## 2019-01-01 DIAGNOSIS — G8110 Spastic hemiplegia affecting unspecified side: Secondary | ICD-10-CM

## 2019-01-01 NOTE — Patient Instructions (Addendum)
Start home physical therapy  Plan for next botox injection first week of May

## 2019-01-05 ENCOUNTER — Telehealth: Payer: Self-pay | Admitting: Neurology

## 2019-01-05 NOTE — Telephone Encounter (Signed)
Noted  

## 2019-01-05 NOTE — Telephone Encounter (Signed)
[name] (PT) with [facility] called regarding this patient and him being sick on Monday and having to reschedule his PT Evaluation for Tuesday. He was also sick on Tuesday and has now rescheduled for Thursday 01/07/19. [name] wanted to make Dr. Posey Pronto aware of the Delay in start of care. Thanks

## 2019-01-12 ENCOUNTER — Telehealth: Payer: Self-pay | Admitting: *Deleted

## 2019-01-12 NOTE — Telephone Encounter (Signed)
Ben (PT) from [facility] called to let us know that patient states he is not feeling well and did not want to do PT.  Suezanne Jacquet said that he will try back next week and let me know what patient decides.

## 2019-01-15 ENCOUNTER — Telehealth: Payer: Self-pay | Admitting: Family Medicine

## 2019-01-15 MED ORDER — QUETIAPINE FUMARATE 400 MG PO TABS: 400.0000 mg | ORAL_TABLET | Freq: Every day | ORAL | 1 refills | Status: DC

## 2019-01-15 NOTE — Addendum Note (Signed)
Addended by: Tammi Sou on: 01/15/2019 04:10 PM   Modules accepted: Orders

## 2019-01-15 NOTE — Telephone Encounter (Signed)
RF request for Seroquel 400mg  LOV: 08/07/18 Next ov: none Last written: 07/13/18 #90 w/ 1RF.  Please advise, thanks.

## 2019-01-15 NOTE — Telephone Encounter (Signed)
OK, done

## 2019-01-15 NOTE — Telephone Encounter (Signed)
Copied from [place] [id number]. Topic: Quick Communication - Rx Refill/Question >> Jan 15, 2019  2:47 PM Margot Ables wrote: Medication: QUEtiapine (SEROQUEL) 400 MG tablet - pt ran out 01/14/2019 - pt called 3/19 to pharmacy - advised him to notify pharmacy 3-5 days ahead of time in the future  Has the patient contacted their pharmacy? Yes - they advised pt that provider has not responded. No electronic request in chart.  Preferred Pharmacy (with phone number or street name): [id number] - [place], [place] [phone] (Phone) [phone] (Fax)

## 2019-02-18 ENCOUNTER — Other Ambulatory Visit: Payer: Self-pay | Admitting: Family Medicine

## 2019-03-08 ENCOUNTER — Ambulatory Visit: Payer: Medicare Other | Admitting: Neurology

## 2019-03-13 ENCOUNTER — Other Ambulatory Visit: Payer: Self-pay | Admitting: Family Medicine

## 2019-03-15 NOTE — Telephone Encounter (Signed)
Pt has enough until appt on 5/20. Will hold off until then.

## 2019-03-17 ENCOUNTER — Encounter: Payer: Self-pay | Admitting: Family Medicine

## 2019-03-17 ENCOUNTER — Other Ambulatory Visit: Payer: Self-pay

## 2019-03-17 ENCOUNTER — Ambulatory Visit (INDEPENDENT_AMBULATORY_CARE_PROVIDER_SITE_OTHER): Payer: Medicare Other | Admitting: Family Medicine

## 2019-03-17 VITALS — BP 115/80 | HR 77

## 2019-03-17 DIAGNOSIS — F411 Generalized anxiety disorder: Secondary | ICD-10-CM | POA: Diagnosis not present

## 2019-03-17 DIAGNOSIS — Z79899 Other long term (current) drug therapy: Secondary | ICD-10-CM | POA: Diagnosis not present

## 2019-03-17 DIAGNOSIS — G4700 Insomnia, unspecified: Secondary | ICD-10-CM

## 2019-03-17 DIAGNOSIS — Z8659 Personal history of other mental and behavioral disorders: Secondary | ICD-10-CM | POA: Diagnosis not present

## 2019-03-17 DIAGNOSIS — I10 Essential (primary) hypertension: Secondary | ICD-10-CM

## 2019-03-17 DIAGNOSIS — R7303 Prediabetes: Secondary | ICD-10-CM | POA: Diagnosis not present

## 2019-03-17 MED ORDER — LISINOPRIL 40 MG PO TABS: ORAL_TABLET | ORAL | 1 refills | Status: DC

## 2019-03-17 MED ORDER — TRAZODONE HCL 100 MG PO TABS: ORAL_TABLET | ORAL | 1 refills | Status: DC

## 2019-03-17 MED ORDER — DULOXETINE HCL 60 MG PO CPEP: ORAL_CAPSULE | ORAL | 1 refills | Status: DC

## 2019-03-17 MED ORDER — QUETIAPINE FUMARATE 400 MG PO TABS: 400.0000 mg | ORAL_TABLET | Freq: Two times a day (BID) | ORAL | 1 refills | Status: DC

## 2019-03-17 NOTE — Progress Notes (Signed)
Virtual Visit via Video Note  I connected with [name] on 03/17/19 at  1:20 PM EDT by a video enabled telemedicine application and verified that I am speaking with the correct person using two identifiers.  Location patient: home Location provider:work or home office Persons participating in the virtual visit: patient, provider  I discussed the limitations of evaluation and management by telemedicine and the availability of in person appointments. The patient expressed understanding and agreed to proceed.  Telemedicine visit is a necessity given the COVID-19 restrictions in place at the current time.  HPI: 58 y/o WM being seen today for 7 month f/u HTN, insomnia, and anxiety/depression. [name] has appt to see Dr. Posey Pronto 05/2019.  HTN: BP well controlled at the time of last f/u visit on lisinopril 40mg  qd. Checks bp occ, 180s syst, 100 diast when very stressed.  Normal when not stressed. Says bp is high every day lately like this b/c lots of anxiety/stress. Says he is feeling "trapped in home".   Pain is worse last few months in left arm, feels very stiff/locked up.  "Pain is ungodly", advil/ibup 5 pills per day.  Says muscle relaxers don't work for him.     Insomnia: last f/u visit I continued him on 400 mg seroquel qhs and increased his trazodone 50mg  to 3-4 tabs qhs.  Still taking 1 hr to go to sleep, wakes up 4h later and cannot get back to sleep in timely manner at all.    Anx/dep: duloxetine 60 mg qd.  Still quite anxious, mainly b/c of increased isolation in home b/c of covid restrictions/fears as well as more pain in L arm lately.   ROS: no CP, no SOB, no rashes, no melena/hematochezia.  No polyuria or polydipsia.  Chronic severe L arm pain secondary to chronic contracture as residual deficit from CVA.   Past Medical History:  Diagnosis Date  . Alcoholism ([place])    Continues to abuse ETOH as of 01/2017; [name] has poor insight into his problem.  [name] states that as of 04/2017 he has been 3 monthds  sober.  . Anxiety and depression   . Arthritis    "hands, legs" (12/23/2017)  . Chronic joint pain   . COPD (chronic obstructive pulmonary disease) ([place])   . Depression   . Frequent headaches    "depends on BP" (12/23/2017)  . History of kidney stones   . Hypertension   . Insomnia 08/26/2017   08/23/17 ED visit for adverse rxn to taking ambien, PLUS + marijuana on UDS her and in ED---NO FURTHER AMBIEN OR ANY OTHER CONTROLLED SUBSTANCES WILL BE PRESCRIBED BY ME--PM  . Pneumothorax, right   . Polysubstance abuse ([place])   . Poor historian    UNRELIABLE HISTORIAN  . 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  Baclofen oral no help.  Botox inj started by Dr. Posey Pronto 07/2018--no help x 1.  Inj #2 done 10/2018.  Marland Kitchen Stroke ([place]) 2012   LUE residual deficit: flexion contracture and weakness of L hand.  Old bilateral cerebral infarcts involving the right frontal and parietal lobes as well as the left occipital lobe.  Baclofen and botox via neuro for L arm contracture.  . Stroke [facility])    "3 mini strokes since 2012" (12/23/2017)  . Subdural hematoma ([place]) 01/29/2017   Subacute right-sided frontal subdural hematoma: found on CT in [place] ED, transferred to [facility] where this was non-operatively managed.  [name] admitted  to the MDs at [facility] that he still drinks lots of whisky daily and they surmised that his frequent falls of late are due to this and frequent use of OTC sleep aids.  Near complete resolution on CT 03/27/18.  . Venous insufficiency of both lower extremities     Past Surgical History:  Procedure Laterality Date  . CHEST TUBE INSERTION    . LUNG SURGERY    . SHOULDER OPEN ROTATOR CUFF REPAIR Bilateral    Multiple rotator cuff surgeries on both sides.  . TONSILLECTOMY    . TYMPANOPLASTY Right   . VIDEO ASSISTED THORACOSCOPY (VATS) W/TALC PLEUADESIS  2010 X2    Family History  Problem Relation Age of Onset  . Arthritis Mother   . Heart  disease Father   . Hypertension Father   . Breast cancer Maternal Aunt   . Alcohol abuse Maternal Uncle   . Lung cancer Maternal Aunt   . Cancer Brother        bone     Current Outpatient Medications:  .  Doxylamine Succinate, Sleep, (SLEEP AID PO), Take 1-2 tablets by mouth at bedtime as needed (for sleep)., Disp: , Rfl:  .  DULoxetine (CYMBALTA) 60 MG capsule, TAKE 1 CAPSULE DAILY BY MOUTH., Disp: 90 capsule, Rfl: 0 .  lisinopril (ZESTRIL) 40 MG tablet, Take 1 tablet (40 mg total) by mouth daily. OFFICE VISIT NEEDED, Disp: 30 tablet, Rfl: 0 .  QUEtiapine (SEROQUEL) 400 MG tablet, Take 1 tablet (400 mg total) by mouth at bedtime., Disp: 90 tablet, Rfl: 1 .  sildenafil (REVATIO) 20 MG tablet, 1-5 tabs po qd prn.  Take 30-60 min prior to intercourse, Disp: 50 tablet, Rfl: 3 .  traMADol (ULTRAM) 50 MG tablet, Take 1 tablet (50 mg total) by mouth every 6 (six) hours as needed for moderate pain., Disp: 30 tablet, Rfl: 0 .  traZODone (DESYREL) 50 MG tablet, TAKE 3-4 TABLETS BY MOUTH AT BEDTIME AS NEEDED FOR INSOMNIA, Disp: 360 tablet, Rfl: 1  EXAM:  VITALS per patient if applicable: BP 829/93 (BP Location: Left Arm, Patient Position: Sitting, Cuff Size: Normal)   Pulse 77    GENERAL: alert, oriented, appears well and in no acute distress  HEENT: atraumatic, conjunttiva clear, no obvious abnormalities on inspection of external nose and ears  NECK: normal movements of the head and neck  LUNGS: on inspection no signs of respiratory distress, breathing rate appears normal, no obvious gross SOB, gasping or wheezing  CV: no obvious cyanosis  MS: moves all visible extremities without noticeable abnormality  PSYCH/NEURO: pleasant and cooperative, no obvious depression or anxiety, speech and thought processing grossly intact  LABS:    Chemistry      Component Value Date/Time   NA 141 08/07/2018 1109   NA 134 (A) 01/29/2017   K 4.4 08/07/2018 1109   CL 105 08/07/2018 1109   CO2 29  08/07/2018 1109   BUN 20 08/07/2018 1109   BUN 28 (A) 01/29/2017   CREATININE 1.15 08/07/2018 1109   CREATININE 1.01 04/03/2018 1528      Component Value Date/Time   CALCIUM 9.6 08/07/2018 1109   ALKPHOS 105 12/21/2017 2209   AST 15 04/03/2018 1528   ALT 13 04/03/2018 1528   BILITOT 0.4 04/03/2018 1528     Lab Results  Component Value Date   TSH 1.58 11/21/2016   Lab Results  Component Value Date   CHOL 112 12/20/2016   HDL 54 12/20/2016   LDLCALC 22 12/20/2016  TRIG 178 (H) 12/20/2016   CHOLHDL 2.1 12/20/2016   Lab Results  Component Value Date   WBC 7.3 03/27/2018   HGB 13.3 03/27/2018   HCT 39.0 (L) 03/27/2018   MCV 96.5 03/27/2018   PLT 313 03/27/2018    ASSESSMENT AND PLAN:  Discussed the following assessment and plan:  1) Uncontrolled HTN: increase lisinopril to 40 mg bid. Would like to avoid clonidine b/c of past hx of borderline bradycardia and [name] at high risk of rebound HTN given hx of noncompliance. Norvasc caused excessive LE edema in the past.  2) GAD/stress/hx of MDD and has SEVERE insomnia: increase seroquel to 400 mg bid.  Check CMET and A1c b/c increased risk of metabolic abnormalities on long term atypical antipsychotic.  3) Chronic pain/contracture in L arm --> residual deficit from CVA. I advised him to d/c use of NSAIDs b/c of uncontrolled bp + risks of GI bleed and renal injury.  Recommended tylenol 1000 mg tid prn.   No controlled substances will be rx'd b/c of hx of abuse.   I discussed the assessment and treatment plan with the patient. The patient was provided an opportunity to ask questions and all were answered. The patient agreed with the plan and demonstrated an understanding of the instructions.   The patient was advised to call back or seek an in-person evaluation if the symptoms worsen or if the condition fails to improve as anticipated.  F/u: 10d lab visit, 2 wks virtual visit f/u HTN  Signed:  Crissie Sickles, MD            03/17/2019

## 2019-04-02 ENCOUNTER — Ambulatory Visit: Payer: Medicare Other

## 2019-04-13 ENCOUNTER — Other Ambulatory Visit: Payer: Self-pay

## 2019-04-13 ENCOUNTER — Ambulatory Visit (INDEPENDENT_AMBULATORY_CARE_PROVIDER_SITE_OTHER): Payer: Medicare Other | Admitting: Family Medicine

## 2019-04-13 ENCOUNTER — Encounter: Payer: Self-pay | Admitting: Family Medicine

## 2019-04-13 VITALS — BP 102/59 | HR 77 | Ht 67.0 in | Wt 180.0 lb

## 2019-04-13 DIAGNOSIS — G4700 Insomnia, unspecified: Secondary | ICD-10-CM | POA: Diagnosis not present

## 2019-04-13 DIAGNOSIS — I10 Essential (primary) hypertension: Secondary | ICD-10-CM

## 2019-04-13 NOTE — Progress Notes (Addendum)
Virtual Visit via Video Note  I connected with pt on 04/13/19 at 11:00 AM EDT by telephone (technical difficulties with telemedicine platforms) and verified that I am speaking with the correct person using two identifiers.  Location patient: home Location provider:work or home office Persons participating in the virtual visit: patient, provider  I discussed the limitations of evaluation and management by telephone and the availability of in person appointments. The patient expressed understanding and agreed to proceed.  Telephone visit is a necessity given the COVID-19 restrictions in place at the current time.  HPI: 58 y/o WM with whom I am doing a telephone visit today (due to COVID-19 pandemic restrictions) for 2 wk f/u uncontrolled HTN and uncontrolled insomnia, both of which have been long term and difficult to treat problems for him in the past for various reasons.  Last visit I increased his lisinopril to 40mg  bid. I also increased his seroquel to 400mg  bid.  I continued him on trazodone hs for sleep and cymbalta daily for mood/anxiety.  BP is improved.  Has checking bp nearly daily and bp around 110-120 on top and 70s on bottom. No HR data from patient.  Denies any further measurements above 160 syst or 100 diast. No hypotension.   Sleep is still the same, likely b/c he never started his bid dosing of his seroquel as we had planned/discussed at his last visit.  Continues to take a 400 mg seroquel qhs, trazodone 200 mg qhs, and doxylamine hs.   ROS: no CP, no SOB, no wheezing, no cough, no dizziness, no HAs, no rashes, no melena/hematochezia.  No polyuria or polydipsia.  No myalgias or arthralgias.   Past Medical History:  Diagnosis Date  . Alcoholism ([place])    Continues to abuse ETOH as of 01/2017; pt has poor insight into his problem.  Pt states that as of 04/2017 he has been 3 monthds sober.  . Anxiety and depression   . Arthritis    "hands, legs" (12/23/2017)  . Chronic joint  pain   . COPD (chronic obstructive pulmonary disease) ([place])   . Depression   . Frequent headaches    "depends on BP" (12/23/2017)  . History of kidney stones   . Hypertension   . Insomnia 08/26/2017   08/23/17 ED visit for adverse rxn to taking ambien, PLUS + marijuana on UDS her and in ED---NO FURTHER AMBIEN OR ANY OTHER CONTROLLED SUBSTANCES WILL BE PRESCRIBED BY ME--PM  . Pneumothorax, right   . Polysubstance abuse ([place])   . Poor historian    UNRELIABLE HISTORIAN  . 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  Baclofen oral no help.  Botox inj started by Dr. Posey Pronto 07/2018--no help x 1.  Inj #2 done 10/2018.  Marland Kitchen Stroke ([place]) 2012   LUE residual deficit: flexion contracture and weakness of L hand.  Old bilateral cerebral infarcts involving the right frontal and parietal lobes as well as the left occipital lobe.  Baclofen and botox via neuro for L arm contracture.  . Stroke [facility])    "3 mini strokes since 2012" (12/23/2017)  . Subdural hematoma ([place]) 01/29/2017   Subacute right-sided frontal subdural hematoma: found on CT in [place] ED, transferred to [facility] where this was non-operatively managed.  Pt admitted to the MDs at [facility] that he still drinks lots of whisky daily and they surmised that his frequent falls of late are due to this and frequent use of  OTC sleep aids.  Near complete resolution on CT 03/27/18.  . Venous insufficiency of both lower extremities     Past Surgical History:  Procedure Laterality Date  . CHEST TUBE INSERTION    . LUNG SURGERY    . SHOULDER OPEN ROTATOR CUFF REPAIR Bilateral    Multiple rotator cuff surgeries on both sides.  . TONSILLECTOMY    . TYMPANOPLASTY Right   . VIDEO ASSISTED THORACOSCOPY (VATS) W/TALC PLEUADESIS  2010 X2    Family History  Problem Relation Age of Onset  . Arthritis Mother   . Heart disease Father   . Hypertension Father   . Breast cancer Maternal Aunt   . Alcohol abuse Maternal  Uncle   . Lung cancer Maternal Aunt   . Cancer Brother        bone     Current Outpatient Medications:  .  Doxylamine Succinate, Sleep, (SLEEP AID PO), Take 1-2 tablets by mouth at bedtime as needed (for sleep)., Disp: , Rfl:  .  DULoxetine (CYMBALTA) 60 MG capsule, TAKE 1 CAPSULE DAILY BY MOUTH., Disp: 90 capsule, Rfl: 1 .  lisinopril (ZESTRIL) 40 MG tablet, 1 tab po bid, Disp: 180 tablet, Rfl: 1 .  QUEtiapine (SEROQUEL) 400 MG tablet, Take 1 tablet (400 mg total) by mouth 2 (two) times daily., Disp: 180 tablet, Rfl: 1 .  traZODone (DESYREL) 100 MG tablet, 2 tabs po qhs for insomnia, Disp: 180 tablet, Rfl: 1  EXAM:  VITALS per patient if applicable: BP (!) 166/06 (BP Location: Left Wrist, Patient Position: Sitting, Cuff Size: Normal)   Pulse 77   Ht 5\' 7"  (1.702 m)   Wt 180 lb (81.6 kg)   BMI 28.19 kg/m    GENERAL: alert, oriented, sounds well and affect is pleasant.  Lucid thought and speech. No further exam b/c telephone encounter.   LABS: none today    Chemistry      Component Value Date/Time   NA 141 08/07/2018 1109   NA 134 (A) 01/29/2017   K 4.4 08/07/2018 1109   CL 105 08/07/2018 1109   CO2 29 08/07/2018 1109   BUN 20 08/07/2018 1109   BUN 28 (A) 01/29/2017   CREATININE 1.15 08/07/2018 1109   CREATININE 1.01 04/03/2018 1528      Component Value Date/Time   CALCIUM 9.6 08/07/2018 1109   ALKPHOS 105 12/21/2017 2209   AST 15 04/03/2018 1528   ALT 13 04/03/2018 1528   BILITOT 0.4 04/03/2018 1528     Lab Results  Component Value Date   HGBA1C (H) 11/02/2010    5.8 (NOTE)                                                                       According to the ADA Clinical Practice Recommendations for 2011, when HbA1c is used as a screening test:   >=6.5%   Diagnostic of Diabetes Mellitus           (if abnormal result  is confirmed)  5.7-6.4%   Increased risk of developing Diabetes Mellitus  References:Diagnosis and Classification of Diabetes Mellitus,Diabetes  TKZS,0109,32(TFTDD 1):S62-S69 and Standards of Medical Care in         Diabetes - 2011,Diabetes Care,2011,34  (Suppl 1):S11-S61.  Lab Results  Component Value Date   CHOL 112 12/20/2016   HDL 54 12/20/2016   LDLCALC 22 12/20/2016   TRIG 178 (H) 12/20/2016   CHOLHDL 2.1 12/20/2016   Lab Results  Component Value Date   TSH 1.58 11/21/2016   Lab Results  Component Value Date   WBC 7.3 03/27/2018   HGB 13.3 03/27/2018   HCT 39.0 (L) 03/27/2018   MCV 96.5 03/27/2018   PLT 313 03/27/2018     ASSESSMENT AND PLAN:  Discussed the following assessment and plan:  1) HTN: The current medical regimen is effective;  continue present plan and medications. Lytes/cr at f/u in 1 mo.  2) Insomnia, chronic and severe: I emphasized the need to start taking his seroquel at 400mg  bid dosing, hopefully to better control his mood disorder and thereby get him sleeping better.  Continue trazodone 200 mg qhs as well.  We've gone over sleep hygiene many times but he doesn't seem to put much stock in this--->focuses on meds constantly.  I discussed the assessment and treatment plan with the patient. The patient was provided an opportunity to ask questions and all were answered. The patient agreed with the plan and demonstrated an understanding of the instructions.   The patient was advised to call back or seek an in-person evaluation if the symptoms worsen or if the condition fails to improve as anticipated.   Spent 15 min with pt today, with >50% of this time spent in counseling and care coordination regarding the above problems.  F/u: 1 mo in office visit to f/u insomnia, htn, and get fasting HP labs  Signed:  Crissie Sickles, MD           04/13/2019

## 2019-04-22 ENCOUNTER — Telehealth: Payer: Self-pay

## 2019-04-22 NOTE — Telephone Encounter (Signed)
Per [name] pt is good for Monday. Pt has MCR.

## 2019-04-26 ENCOUNTER — Ambulatory Visit: Payer: Medicare Other | Admitting: Neurology

## 2019-05-17 ENCOUNTER — Telehealth: Payer: Self-pay

## 2019-05-17 ENCOUNTER — Other Ambulatory Visit: Payer: Self-pay

## 2019-05-17 ENCOUNTER — Encounter: Payer: Medicare Other | Admitting: Family Medicine

## 2019-05-17 NOTE — Progress Notes (Deleted)
Virtual Visit via Video Note  I connected with pt on 05/17/19 at 10:30 AM EDT by a video enabled telemedicine application and verified that I am speaking with the correct person using two identifiers.  Location patient: home Location provider:work or home office Persons participating in the virtual visit: patient, provider  I discussed the limitations of evaluation and management by telemedicine and the availability of in person appointments. The patient expressed understanding and agreed to proceed.  Telemedicine visit is a necessity given the COVID-19 restrictions in place at the current time.  HPI: 58 y/o WM being seen today for 1 mo f/u uncontrolled HTN and uncontrolled insomnia, both of which have been long term and difficult to treat problems for him in the past for various reasons.  HTN: was well controlled at the time of last visit and no further med titration was needed at that time.  Insomnia: last visit he had not increased his seroquel to 400 bid as instructed, so I emphasized the need for him to do this to hopefully better control his mood disorder and thereby get him sleeping better. He doesn't put any stock in sleep hygiene, unfortunately.  ROS: See pertinent positives and negatives per HPI.  Past Medical History:  Diagnosis Date  . Alcoholism ([place])    Continues to abuse ETOH as of 01/2017; pt has poor insight into his problem.  Pt states that as of 04/2017 he has been 3 monthds sober.  . Anxiety and depression   . Arthritis    "hands, legs" (12/23/2017)  . Chronic joint pain   . COPD (chronic obstructive pulmonary disease) ([place])   . Depression   . Frequent headaches    "depends on BP" (12/23/2017)  . History of kidney stones   . Hypertension   . Insomnia 08/26/2017   08/23/17 ED visit for adverse rxn to taking ambien, PLUS + marijuana on UDS her and in ED---NO FURTHER AMBIEN OR ANY OTHER CONTROLLED SUBSTANCES WILL BE PRESCRIBED BY ME--PM  . Pneumothorax, right   .  Polysubstance abuse ([place])   . Poor historian    UNRELIABLE HISTORIAN  . 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  Baclofen oral no help.  Botox inj started by Dr. Posey Pronto 07/2018--no help x 1.  Inj #2 done 10/2018.  Marland Kitchen Stroke ([place]) 2012   LUE residual deficit: flexion contracture and weakness of L hand.  Old bilateral cerebral infarcts involving the right frontal and parietal lobes as well as the left occipital lobe.  Baclofen and botox via neuro for L arm contracture.  . Stroke [facility])    "3 mini strokes since 2012" (12/23/2017)  . Subdural hematoma ([place]) 01/29/2017   Subacute right-sided frontal subdural hematoma: found on CT in [place] ED, transferred to [facility] where this was non-operatively managed.  Pt admitted to the MDs at [facility] that he still drinks lots of whisky daily and they surmised that his frequent falls of late are due to this and frequent use of OTC sleep aids.  Near complete resolution on CT 03/27/18.  . Venous insufficiency of both lower extremities     Past Surgical History:  Procedure Laterality Date  . CHEST TUBE INSERTION    . LUNG SURGERY    . SHOULDER OPEN ROTATOR CUFF REPAIR Bilateral    Multiple rotator cuff surgeries on both sides.  . TONSILLECTOMY    . TYMPANOPLASTY Right   . VIDEO ASSISTED THORACOSCOPY (VATS) W/TALC  PLEUADESIS  2010 X2    Family History  Problem Relation Age of Onset  . Arthritis Mother   . Heart disease Father   . Hypertension Father   . Breast cancer Maternal Aunt   . Alcohol abuse Maternal Uncle   . Lung cancer Maternal Aunt   . Cancer Brother        bone    SOCIAL HX:  Social History   Socioeconomic History  . Marital status: Divorced    Spouse name: Not on file  . Number of children: 2  . Years of education: Not on file  . Highest education level: Not on file  Occupational History  . Not on file  Social Needs  . Financial resource strain: Not on file  . Food insecurity     Worry: Not on file    Inability: Not on file  . Transportation needs    Medical: Not on file    Non-medical: Not on file  Tobacco Use  . Smoking status: Current Every Day Smoker    Packs/day: 1.00    Years: 44.00    Pack years: 44.00    Types: Cigarettes  . Smokeless tobacco: Never Used  Substance and Sexual Activity  . Alcohol use: No    Comment: Quit summer 2018. (12/23/2017)  . Drug use: Yes    Frequency: 2.0 times per week    Types: Marijuana    Comment: 12/23/2017 "no more than once/day; ave 3-4 days/wk"  . Sexual activity: Not Currently  Lifestyle  . Physical activity    Days per week: Not on file    Minutes per session: Not on file  . Stress: Not on file  Relationships  . Social Herbalist on phone: Not on file    Gets together: Not on file    Attends religious service: Not on file    Active member of club or organization: Not on file    Attends meetings of clubs or organizations: Not on file    Relationship status: Not on file  Other Topics Concern  . Not on file  Social History Narrative   Divorced, 1 son and 1 daughter in [place].   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   Smokes: 80 pack-yr hx, current as of 10/2016.   Alc: 1-2 pint of Jim Beam per week.   Was in AA > 20 yrs ago.   Marijuana regularly.   "Lots of drugs" in the past.      Current Outpatient Medications:  .  Doxylamine Succinate, Sleep, (SLEEP AID PO), Take 1-2 tablets by mouth at bedtime as needed (for sleep)., Disp: , Rfl:  .  DULoxetine (CYMBALTA) 60 MG capsule, TAKE 1 CAPSULE DAILY BY MOUTH., Disp: 90 capsule, Rfl: 1 .  lisinopril (ZESTRIL) 40 MG tablet, 1 tab po bid, Disp: 180 tablet, Rfl: 1 .  QUEtiapine (SEROQUEL) 400 MG tablet, Take 1 tablet (400 mg total) by mouth 2 (two) times daily., Disp: 180 tablet, Rfl: 1 .  traZODone (DESYREL) 100 MG tablet, 2 tabs po qhs for insomnia, Disp: 180 tablet, Rfl: 1  EXAM:  VITALS per patient if applicable:  GENERAL: alert,  oriented, appears well and in no acute distress  HEENT: atraumatic, conjunttiva clear, no obvious abnormalities on inspection of external nose and ears  NECK: normal movements of the head and neck  LUNGS: on inspection no signs of respiratory distress, breathing rate appears normal, no obvious gross SOB, gasping or wheezing  CV: no obvious cyanosis  MS: moves all visible extremities without noticeable abnormality  PSYCH/NEURO: pleasant and cooperative, no obvious depression or anxiety, speech and thought processing grossly intact  LABS: none today    Chemistry      Component Value Date/Time   NA 141 08/07/2018 1109   NA 134 (A) 01/29/2017   K 4.4 08/07/2018 1109   CL 105 08/07/2018 1109   CO2 29 08/07/2018 1109   BUN 20 08/07/2018 1109   BUN 28 (A) 01/29/2017   CREATININE 1.15 08/07/2018 1109   CREATININE 1.01 04/03/2018 1528      Component Value Date/Time   CALCIUM 9.6 08/07/2018 1109   ALKPHOS 105 12/21/2017 2209   AST 15 04/03/2018 1528   ALT 13 04/03/2018 1528   BILITOT 0.4 04/03/2018 1528      ASSESSMENT AND PLAN:  Discussed the following assessment and plan:  No diagnosis found.  BMET  I discussed the assessment and treatment plan with the patient. The patient was provided an opportunity to ask questions and all were answered. The patient agreed with the plan and demonstrated an understanding of the instructions.   The patient was advised to call back or seek an in-person evaluation if the symptoms worsen or if the condition fails to improve as anticipated.  F/u:  ***  Signed:  Crissie Sickles, MD           05/17/2019

## 2019-05-17 NOTE — Telephone Encounter (Signed)
Pt had appointment today. Tried to reach several times and left message to call back regarding appointment.

## 2019-05-19 ENCOUNTER — Encounter: Payer: Medicare Other | Admitting: Family Medicine

## 2019-05-19 ENCOUNTER — Encounter: Payer: Self-pay | Admitting: Family Medicine

## 2019-05-19 ENCOUNTER — Other Ambulatory Visit: Payer: Self-pay

## 2019-05-19 NOTE — Progress Notes (Signed)
Virtual Visit via Video Note  I connected with pt on 05/19/19 at  3:30 PM EDT by telephone (video platform presented technical difficulties) and verified that I am speaking with the correct person using two identifiers.  Location patient: home Location provider:work or home office Persons participating in the virtual visit: patient, provider  I discussed the limitations of evaluation and management by telephone and the availability of in person appointments. The patient expressed understanding and agreed to proceed.  Telephone visit is a necessity given the COVID-19 restrictions in place at the current time.  HPI: (pt no-showed his appt on 05/17/19).  58 y/o WM with whom I am doing a telephone visit today (due to COVID-19 pandemic restrictions) for 1 mo f/u uncontrolled HTN and uncontrolled insomnia, both of which have been long term and difficult to treat problems for him in the past for various reasons.   HTN: was well controlled at the time of last visit and no further med titration was needed at that time.   Insomnia: last visit he had not increased his seroquel to 400 bid as instructed, so I emphasized the need for him to do this to hopefully better control his mood disorder and thereby get him sleeping better. He doesn't put any stock in sleep hygiene, unfortunately.   ROS: See pertinent positives and negatives per HPI.  Past Medical History:  Diagnosis Date   Alcoholism ([place])    Continues to abuse ETOH as of 01/2017; pt has poor insight into his problem.  Pt states that as of 04/2017 he has been 3 monthds sober.   Anxiety and depression    Arthritis    "hands, legs" (12/23/2017)   Chronic joint pain    COPD (chronic obstructive pulmonary disease) ([place])    Depression    Frequent headaches    "depends on BP" (12/23/2017)   History of kidney stones    Hypertension    Insomnia 08/26/2017   08/23/17 ED visit for adverse rxn to taking ambien, PLUS + marijuana on UDS her and in  ED---NO FURTHER AMBIEN OR ANY OTHER CONTROLLED SUBSTANCES WILL BE PRESCRIBED BY ME--PM   Pneumothorax, right    Polysubstance abuse ([place])    Poor historian    UNRELIABLE HISTORIAN   Spastic hemiparesis ([place])    left arm (signif 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  Baclofen oral no help.  Botox inj started by Dr. Posey Pronto 07/2018--no help x 1.  Inj #2 done 10/2018.   Stroke ([place]) 2012   LUE residual deficit: flexion contracture and weakness of L hand.  Old bilateral cerebral infarcts involving the right frontal and parietal lobes as well as the left occipital lobe.  Baclofen and botox via neuro for L arm contracture.   Stroke [facility])    "3 mini strokes since 2012" (12/23/2017)   Subdural hematoma ([place]) 01/29/2017   Subacute right-sided frontal subdural hematoma: found on CT in [place] ED, transferred to [facility] where this was non-operatively managed.  Pt admitted to the MDs at [facility] that he still drinks lots of whisky daily and they surmised that his frequent falls of late are due to this and frequent use of OTC sleep aids.  Near complete resolution on CT 03/27/18.   Venous insufficiency of both lower extremities     Past Surgical History:  Procedure Laterality Date   CHEST TUBE INSERTION     LUNG SURGERY     SHOULDER OPEN ROTATOR CUFF REPAIR Bilateral    Multiple  rotator cuff surgeries on both sides.   TONSILLECTOMY     TYMPANOPLASTY Right    VIDEO ASSISTED THORACOSCOPY (VATS) W/TALC PLEUADESIS  2010 X2    Family History  Problem Relation Age of Onset   Arthritis Mother    Heart disease Father    Hypertension Father    Breast cancer Maternal Aunt    Alcohol abuse Maternal Uncle    Lung cancer Maternal Aunt    Cancer Brother        bone     Current Outpatient Medications:    Doxylamine Succinate, Sleep, (SLEEP AID PO), Take 1-2 tablets by mouth at bedtime as needed (for sleep)., Disp: , Rfl:    DULoxetine (CYMBALTA) 60 MG capsule, TAKE 1 CAPSULE DAILY BY MOUTH., Disp: 90  capsule, Rfl: 1   lisinopril (ZESTRIL) 40 MG tablet, 1 tab po bid, Disp: 180 tablet, Rfl: 1   QUEtiapine (SEROQUEL) 400 MG tablet, Take 1 tablet (400 mg total) by mouth 2 (two) times daily., Disp: 180 tablet, Rfl: 1   traZODone (DESYREL) 100 MG tablet, 2 tabs po qhs for insomnia, Disp: 180 tablet, Rfl: 1  EXAM:  VITALS per patient if applicable: There were no vitals taken for this visit. Gen: Alert, well appearing.  Patient is oriented to person, place, time, and situation. No further exam today b/c telephone visit only.  LABS: none today    Chemistry      Component Value Date/Time   NA 141 08/07/2018 1109   NA 134 (A) 01/29/2017   K 4.4 08/07/2018 1109   CL 105 08/07/2018 1109   CO2 29 08/07/2018 1109   BUN 20 08/07/2018 1109   BUN 28 (A) 01/29/2017   CREATININE 1.15 08/07/2018 1109   CREATININE 1.01 04/03/2018 1528      Component Value Date/Time   CALCIUM 9.6 08/07/2018 1109   ALKPHOS 105 12/21/2017 2209   AST 15 04/03/2018 1528   ALT 13 04/03/2018 1528   BILITOT 0.4 04/03/2018 1528      ASSESSMENT AND PLAN:  Discussed the following assessment and plan:  No diagnosis found.  Despite calling the patient 5 times he did not answer his phone, so this visit is a no-show.  Needs BMET.  I discussed the assessment and treatment plan with the patient. The patient was provided an opportunity to ask questions and all were answered. The patient agreed with the plan and demonstrated an understanding of the instructions.   The patient was advised to call back or seek an in-person evaluation if the symptoms worsen or if the condition fails to improve as anticipated.  F/u:   Signed:  Crissie Sickles, MD           05/19/2019

## 2019-07-01 ENCOUNTER — Telehealth: Payer: Self-pay | Admitting: Family Medicine

## 2019-07-01 NOTE — Telephone Encounter (Signed)
Patient wants to know what symptoms of staph infection.  Please call [phone]

## 2019-07-02 NOTE — Telephone Encounter (Signed)
LM for pt to call back regarding current symptoms. Further information is needed.

## 2019-07-06 NOTE — Telephone Encounter (Signed)
LM for pt to call back regarding current symptoms. Further information is needed.

## 2019-07-13 NOTE — Telephone Encounter (Signed)
Attempted to contact patient 3 time with no success. If pt calls back, please ask current symptoms.

## 2019-09-09 ENCOUNTER — Other Ambulatory Visit: Payer: Self-pay | Admitting: Family Medicine

## 2019-09-12 ENCOUNTER — Other Ambulatory Visit: Payer: Self-pay | Admitting: Family Medicine

## 2019-09-13 NOTE — Telephone Encounter (Signed)
Called patient and lvm to call back. Patient needs to schedule a follow up appointment and keep appointment

## 2019-09-13 NOTE — Telephone Encounter (Signed)
Pt has no-showed the last 2 appointments. I'll do 30d supply of this med but if not seen for f/u in the next 1 month then no further refills.

## 2019-09-13 NOTE — Telephone Encounter (Signed)
RF request for trazodone LOV: 05/19/19 Next ov: not scheduled Last written: 03/17/2019   Please advise, thank you

## 2019-09-23 ENCOUNTER — Inpatient Hospital Stay
Admission: EM | Admit: 2019-09-23 | Discharge: 2019-09-25 | DRG: 641 | Disposition: A | Discharge status: Home / Self Care | Payer: Medicare Other | Attending: Internal Medicine | Admitting: Internal Medicine

## 2019-09-23 ENCOUNTER — Emergency Department: Payer: Medicare Other

## 2019-09-23 ENCOUNTER — Other Ambulatory Visit: Payer: Self-pay

## 2019-09-23 ENCOUNTER — Encounter: Payer: Self-pay | Admitting: Internal Medicine

## 2019-09-23 DIAGNOSIS — I69354 Hemiplegia and hemiparesis following cerebral infarction affecting left non-dominant side: Secondary | ICD-10-CM | POA: Diagnosis not present

## 2019-09-23 DIAGNOSIS — R55 Syncope and collapse: Secondary | ICD-10-CM | POA: Diagnosis present

## 2019-09-23 DIAGNOSIS — E876 Hypokalemia: Secondary | ICD-10-CM | POA: Diagnosis present

## 2019-09-23 DIAGNOSIS — W010XXA Fall on same level from slipping, tripping and stumbling without subsequent striking against object, initial encounter: Secondary | ICD-10-CM | POA: Diagnosis present

## 2019-09-23 DIAGNOSIS — I951 Orthostatic hypotension: Secondary | ICD-10-CM | POA: Diagnosis present

## 2019-09-23 DIAGNOSIS — R2681 Unsteadiness on feet: Secondary | ICD-10-CM | POA: Insufficient documentation

## 2019-09-23 DIAGNOSIS — Z20828 Contact with and (suspected) exposure to other viral communicable diseases: Secondary | ICD-10-CM | POA: Diagnosis present

## 2019-09-23 DIAGNOSIS — Z8673 Personal history of transient ischemic attack (TIA), and cerebral infarction without residual deficits: Secondary | ICD-10-CM | POA: Diagnosis not present

## 2019-09-23 DIAGNOSIS — Z720 Tobacco use: Secondary | ICD-10-CM | POA: Diagnosis not present

## 2019-09-23 DIAGNOSIS — Z888 Allergy status to other drugs, medicaments and biological substances status: Secondary | ICD-10-CM | POA: Diagnosis not present

## 2019-09-23 DIAGNOSIS — E860 Dehydration: Secondary | ICD-10-CM | POA: Diagnosis present

## 2019-09-23 DIAGNOSIS — I6930 Unspecified sequelae of cerebral infarction: Secondary | ICD-10-CM

## 2019-09-23 DIAGNOSIS — Z79899 Other long term (current) drug therapy: Secondary | ICD-10-CM

## 2019-09-23 DIAGNOSIS — I639 Cerebral infarction, unspecified: Secondary | ICD-10-CM

## 2019-09-23 DIAGNOSIS — I10 Essential (primary) hypertension: Secondary | ICD-10-CM | POA: Diagnosis present

## 2019-09-23 DIAGNOSIS — E512 Wernicke's encephalopathy: Secondary | ICD-10-CM

## 2019-09-23 DIAGNOSIS — Z801 Family history of malignant neoplasm of trachea, bronchus and lung: Secondary | ICD-10-CM

## 2019-09-23 DIAGNOSIS — S6992XA Unspecified injury of left wrist, hand and finger(s), initial encounter: Secondary | ICD-10-CM | POA: Diagnosis not present

## 2019-09-23 DIAGNOSIS — R296 Repeated falls: Secondary | ICD-10-CM | POA: Diagnosis present

## 2019-09-23 DIAGNOSIS — S199XXA Unspecified injury of neck, initial encounter: Secondary | ICD-10-CM | POA: Diagnosis not present

## 2019-09-23 DIAGNOSIS — Y92019 Unspecified place in single-family (private) house as the place of occurrence of the external cause: Secondary | ICD-10-CM | POA: Diagnosis not present

## 2019-09-23 DIAGNOSIS — Z8249 Family history of ischemic heart disease and other diseases of the circulatory system: Secondary | ICD-10-CM | POA: Diagnosis not present

## 2019-09-23 DIAGNOSIS — M19042 Primary osteoarthritis, left hand: Secondary | ICD-10-CM | POA: Diagnosis present

## 2019-09-23 DIAGNOSIS — N179 Acute kidney failure, unspecified: Secondary | ICD-10-CM | POA: Diagnosis present

## 2019-09-23 DIAGNOSIS — R270 Ataxia, unspecified: Secondary | ICD-10-CM | POA: Diagnosis not present

## 2019-09-23 DIAGNOSIS — Z803 Family history of malignant neoplasm of breast: Secondary | ICD-10-CM | POA: Diagnosis not present

## 2019-09-23 DIAGNOSIS — J449 Chronic obstructive pulmonary disease, unspecified: Secondary | ICD-10-CM | POA: Diagnosis present

## 2019-09-23 DIAGNOSIS — Z8261 Family history of arthritis: Secondary | ICD-10-CM | POA: Diagnosis not present

## 2019-09-23 DIAGNOSIS — M19041 Primary osteoarthritis, right hand: Secondary | ICD-10-CM | POA: Diagnosis present

## 2019-09-23 DIAGNOSIS — R531 Weakness: Secondary | ICD-10-CM | POA: Insufficient documentation

## 2019-09-23 DIAGNOSIS — S0990XA Unspecified injury of head, initial encounter: Secondary | ICD-10-CM | POA: Diagnosis not present

## 2019-09-23 DIAGNOSIS — S299XXA Unspecified injury of thorax, initial encounter: Secondary | ICD-10-CM | POA: Diagnosis not present

## 2019-09-23 DIAGNOSIS — F17210 Nicotine dependence, cigarettes, uncomplicated: Secondary | ICD-10-CM | POA: Diagnosis present

## 2019-09-23 DIAGNOSIS — I63233 Cerebral infarction due to unspecified occlusion or stenosis of bilateral carotid arteries: Secondary | ICD-10-CM | POA: Diagnosis not present

## 2019-09-23 DIAGNOSIS — Z716 Tobacco abuse counseling: Secondary | ICD-10-CM | POA: Diagnosis not present

## 2019-09-23 LAB — URINE DRUG SCREEN, QUALITATIVE (ARMC ONLY)
Amphetamines, Ur Screen: NOT DETECTED
Barbiturates, Ur Screen: NOT DETECTED
Benzodiazepine, Ur Scrn: NOT DETECTED
Cannabinoid 50 Ng, Ur ~~LOC~~: POSITIVE — AB
Cocaine Metabolite,Ur ~~LOC~~: NOT DETECTED
MDMA (Ecstasy)Ur Screen: NOT DETECTED
Methadone Scn, Ur: NOT DETECTED
Opiate, Ur Screen: NOT DETECTED
Phencyclidine (PCP) Ur S: NOT DETECTED
Tricyclic, Ur Screen: POSITIVE — AB

## 2019-09-23 LAB — COMPREHENSIVE METABOLIC PANEL
ALT: 11 U/L (ref 0–44)
AST: 21 U/L (ref 15–41)
Albumin: 4 g/dL (ref 3.5–5.0)
Alkaline Phosphatase: 92 U/L (ref 38–126)
Anion gap: 13 (ref 5–15)
BUN: 24 mg/dL — ABNORMAL HIGH (ref 6–20)
CO2: 25 mmol/L (ref 22–32)
Calcium: 9.5 mg/dL (ref 8.9–10.3)
Chloride: 102 mmol/L (ref 98–111)
Creatinine, Ser: 2.33 mg/dL — ABNORMAL HIGH (ref 0.61–1.24)
GFR calc Af Amer: 34 mL/min — ABNORMAL LOW (ref >60)
GFR calc non Af Amer: 29 mL/min — ABNORMAL LOW (ref >60)
Glucose, Bld: 128 mg/dL — ABNORMAL HIGH (ref 70–99)
Potassium: 3.4 mmol/L — ABNORMAL LOW (ref 3.5–5.1)
Sodium: 140 mmol/L (ref 135–145)
Total Bilirubin: 0.9 mg/dL (ref 0.3–1.2)
Total Protein: 7.5 g/dL (ref 6.5–8.1)

## 2019-09-23 LAB — CBC
HCT: 42.4 % (ref 39.0–52.0)
Hemoglobin: 14.3 g/dL (ref 13.0–17.0)
MCH: 32 pg (ref 26.0–34.0)
MCHC: 33.7 g/dL (ref 30.0–36.0)
MCV: 94.9 fL (ref 80.0–100.0)
Platelets: 284 10*3/uL (ref 150–400)
RBC: 4.47 MIL/uL (ref 4.22–5.81)
RDW: 12.9 % (ref 11.5–15.5)
WBC: 9.8 10*3/uL (ref 4.0–10.5)
nRBC: 0 % (ref 0.0–0.2)

## 2019-09-23 LAB — ETHANOL: Alcohol, Ethyl (B): <10 mg/dL (ref <10)

## 2019-09-23 LAB — SARS CORONAVIRUS 2 (TAT 6-24 HRS): SARS Coronavirus 2: NEGATIVE

## 2019-09-23 LAB — TROPONIN I (HIGH SENSITIVITY): Troponin I (High Sensitivity): 7 ng/L (ref <18)

## 2019-09-23 LAB — LIPASE, BLOOD: Lipase: 25 U/L (ref 11–51)

## 2019-09-23 LAB — MAGNESIUM: Magnesium: 1.8 mg/dL (ref 1.7–2.4)

## 2019-09-23 MED ORDER — ACETAMINOPHEN 650 MG RE SUPP: 650.0000 mg | Freq: Four times a day (QID) | RECTAL | Status: DC | PRN

## 2019-09-23 MED ORDER — THIAMINE HCL 100 MG/ML IJ SOLN: 100.0000 mg | Freq: Once | INTRAMUSCULAR | Status: DC

## 2019-09-23 MED ORDER — ATORVASTATIN CALCIUM 20 MG PO TABS
40.0000 mg | ORAL_TABLET | Freq: Every day | ORAL | Status: DC
  Administered 2019-09-23 – 2019-09-25 (×3): 40 mg via ORAL
  Filled 2019-09-23 (×3): qty 2

## 2019-09-23 MED ORDER — THIAMINE HCL 100 MG/ML IJ SOLN
500.0000 mg | Freq: Three times a day (TID) | INTRAVENOUS | Status: DC
  Administered 2019-09-24 – 2019-09-25 (×3): 500 mg via INTRAVENOUS
  Filled 2019-09-23 (×12): qty 5

## 2019-09-23 MED ORDER — DULOXETINE HCL 60 MG PO CPEP
60.0000 mg | ORAL_CAPSULE | Freq: Every day | ORAL | Status: DC
  Administered 2019-09-24 – 2019-09-25 (×2): 60 mg via ORAL
  Filled 2019-09-23 (×2): qty 1

## 2019-09-23 MED ORDER — NICOTINE 21 MG/24HR TD PT24
21.0000 mg | MEDICATED_PATCH | Freq: Every day | TRANSDERMAL | Status: DC
  Administered 2019-09-23 – 2019-09-25 (×3): 21 mg via TRANSDERMAL
  Filled 2019-09-23 (×3): qty 1

## 2019-09-23 MED ORDER — ONDANSETRON HCL 4 MG PO TABS: 4.0000 mg | ORAL_TABLET | Freq: Four times a day (QID) | ORAL | Status: DC | PRN

## 2019-09-23 MED ORDER — ONDANSETRON HCL 4 MG/2ML IJ SOLN: 4.0000 mg | Freq: Four times a day (QID) | INTRAMUSCULAR | Status: DC | PRN

## 2019-09-23 MED ORDER — LACTATED RINGERS IV BOLUS
1000.0000 mL | Freq: Once | INTRAVENOUS | Status: AC
  Administered 2019-09-23: 15:00:00 1000 mL via INTRAVENOUS

## 2019-09-23 MED ORDER — QUETIAPINE FUMARATE 25 MG PO TABS: 200.0000 mg | ORAL_TABLET | Freq: Two times a day (BID) | ORAL | Status: DC

## 2019-09-23 MED ORDER — SODIUM CHLORIDE 0.9 % IV SOLN
INTRAVENOUS | Status: DC
  Administered 2019-09-23 – 2019-09-25 (×4): via INTRAVENOUS

## 2019-09-23 MED ORDER — THIAMINE HCL 100 MG/ML IJ SOLN
500.0000 mg | Freq: Once | INTRAVENOUS | Status: AC
  Administered 2019-09-24: 02:00:00 500 mg via INTRAVENOUS
  Filled 2019-09-23 (×2): qty 5

## 2019-09-23 MED ORDER — POTASSIUM CHLORIDE CRYS ER 20 MEQ PO TBCR
40.0000 meq | EXTENDED_RELEASE_TABLET | Freq: Once | ORAL | Status: AC
  Administered 2019-09-23: 21:00:00 40 meq via ORAL
  Filled 2019-09-23: qty 2

## 2019-09-23 MED ORDER — ACETAMINOPHEN 325 MG PO TABS
650.0000 mg | ORAL_TABLET | Freq: Four times a day (QID) | ORAL | Status: DC | PRN
  Administered 2019-09-24: 21:00:00 650 mg via ORAL
  Filled 2019-09-23: qty 2

## 2019-09-23 MED ORDER — ASPIRIN EC 81 MG PO TBEC
81.0000 mg | DELAYED_RELEASE_TABLET | Freq: Every day | ORAL | Status: DC
  Administered 2019-09-23 – 2019-09-25 (×3): 81 mg via ORAL
  Filled 2019-09-23 (×3): qty 1

## 2019-09-23 MED ORDER — CALCIUM CARBONATE ANTACID 500 MG PO CHEW
1.0000 | CHEWABLE_TABLET | Freq: Once | ORAL | Status: AC
  Administered 2019-09-23: 22:00:00 200 mg via ORAL
  Filled 2019-09-23: qty 1

## 2019-09-23 MED ORDER — MAGNESIUM SULFATE 2 GM/50ML IV SOLN
2.0000 g | Freq: Once | INTRAVENOUS | Status: AC
  Administered 2019-09-23: 22:00:00 2 g via INTRAVENOUS
  Filled 2019-09-23: qty 50

## 2019-09-23 MED ORDER — MAGNESIUM SULFATE 2 GM/50ML IV SOLN
INTRAVENOUS | Status: AC
  Filled 2019-09-23: qty 50

## 2019-09-23 MED ORDER — ENOXAPARIN SODIUM 40 MG/0.4ML ~~LOC~~ SOLN
40.0000 mg | SUBCUTANEOUS | Status: DC
  Administered 2019-09-23 – 2019-09-24 (×2): 40 mg via SUBCUTANEOUS
  Filled 2019-09-23 (×3): qty 0.4

## 2019-09-23 MED ORDER — QUETIAPINE FUMARATE 200 MG PO TABS
200.0000 mg | ORAL_TABLET | Freq: Two times a day (BID) | ORAL | Status: DC
  Administered 2019-09-24 (×2): 200 mg via ORAL
  Filled 2019-09-23 (×3): qty 1

## 2019-09-23 NOTE — H&P (Signed)
[facility] NAME: [name]    MR#:  [id number]  DATE OF BIRTH:  [birth year]  DATE OF ADMISSION:  09/23/2019  PRIMARY CARE PHYSICIAN: Tammi Sou, MD   REQUESTING/REFERRING PHYSICIAN: Dr Blake Divine  CHIEF COMPLAINT:   Chief Complaint  Patient presents with  . Fall    HISTORY OF PRESENT ILLNESS:  [name]  is a 58 y.o. male presents to the ER with falling over the last few days.  He has a history of stroke and mini strokes and he states that this is what happened previously.  One of the episodes today he got into the hall and tripped and fell and landed on his left arm and right face.  He had 5-6 falls over the last few days.  He states one episode where he stood up quickly and he blacked out and ended up on the ground.  The patient is not the best historian and he did not want me to call any family at this time.  In the ER, he was found to be in acute kidney injury and hospitalist were consulted for admission.  The patient states that he does have some blurry vision bilaterally.  Occasionally trouble swallowing liquids and solids.  The patient states that his balance has been off for a long time.  PAST MEDICAL HISTORY:   Past Medical History:  Diagnosis Date  . Alcoholism ([place])    Continues to abuse ETOH as of 01/2017; pt has poor insight into his problem.  Pt states that as of 04/2017 he has been 3 monthds sober.  . Anxiety and depression   . Arthritis    "hands, legs" (12/23/2017)  . Chronic joint pain   . COPD (chronic obstructive pulmonary disease) ([place])   . Depression   . Frequent headaches    "depends on BP" (12/23/2017)  . History of kidney stones   . Hypertension   . Insomnia 08/26/2017   08/23/17 ED visit for adverse rxn to taking ambien, PLUS + marijuana on UDS her and in ED---NO FURTHER AMBIEN OR ANY OTHER CONTROLLED SUBSTANCES WILL BE PRESCRIBED BY ME--PM  . Pneumothorax, right   . Polysubstance abuse ([place])    . Poor historian    UNRELIABLE HISTORIAN  . 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  Baclofen oral no help.  Botox inj started by Dr. Posey Pronto 07/2018--no help x 1.  Inj #2 done 10/2018.  Marland Kitchen Stroke ([place]) 2012   LUE residual deficit: flexion contracture and weakness of L hand.  Old bilateral cerebral infarcts involving the right frontal and parietal lobes as well as the left occipital lobe.  Baclofen and botox via neuro for L arm contracture.  . Stroke [facility])    "3 mini strokes since 2012" (12/23/2017)  . Subdural hematoma ([place]) 01/29/2017   Subacute right-sided frontal subdural hematoma: found on CT in [place] ED, transferred to [facility] where this was non-operatively managed.  Pt admitted to the MDs at [facility] that he still drinks lots of whisky daily and they surmised that his frequent falls of late are due to this and frequent use of OTC sleep aids.  Near complete resolution on CT 03/27/18.  . Venous insufficiency of both lower extremities     PAST SURGICAL HISTORY:   Past Surgical History:  Procedure Laterality Date  . CHEST TUBE INSERTION    . LUNG SURGERY    .  SHOULDER OPEN ROTATOR CUFF REPAIR Bilateral    Multiple rotator cuff surgeries on both sides.  . TONSILLECTOMY    . TYMPANOPLASTY Right   . VIDEO ASSISTED THORACOSCOPY (VATS) W/TALC PLEUADESIS  2010 X2    SOCIAL HISTORY:   Social History   Tobacco Use  . Smoking status: Current Every Day Smoker    Packs/day: 1.00    Years: 44.00    Pack years: 44.00    Types: Cigarettes  . Smokeless tobacco: Never Used  Substance Use Topics  . Alcohol use: No    Comment: Quit summer 2018. (12/23/2017)    FAMILY HISTORY:   Family History  Problem Relation Age of Onset  . Arthritis Mother   . Heart disease Father   . Hypertension Father   . Breast cancer Maternal Aunt   . Alcohol abuse Maternal Uncle   . Lung cancer Maternal Aunt   . Cancer Brother        bone    DRUG  ALLERGIES:   Allergies  Allergen Reactions  . Ambien [Zolpidem] Other (See Comments)    Pt took too much, acted out a bad dream/was throwing things, etc--ED visit (07/2017)    REVIEW OF SYSTEMS:  CONSTITUTIONAL: No fever, chills.  Sometimes has night sweats.  Positive for chronic left-sided weakness.  EYES: Positive for blurred vision.  Wears glasses EARS, NOSE, AND THROAT: No tinnitus or ear pain. No sore throat RESPIRATORY: No cough, shortness of breath, wheezing or hemoptysis.  CARDIOVASCULAR: No chest pain, orthopnea, edema.  GASTROINTESTINAL: No nausea or abdominal pain. No blood in bowel movements.  Occasional vomiting.  Sometimes alternating diarrhea and constipation. GENITOURINARY: No dysuria, hematuria.  ENDOCRINE: No polyuria, nocturia,  HEMATOLOGY: No anemia, easy bruising or bleeding SKIN: No rash or lesion. MUSCULOSKELETAL: Positive joint pains all over NEUROLOGIC: Chronic left-sided weakness.  PSYCHIATRY: History of depression.   MEDICATIONS AT HOME:   Prior to Admission medications   Medication Sig Start Date End Date Taking? Authorizing Provider  Doxylamine Succinate, Sleep, (SLEEP AID PO) Take 1-2 tablets by mouth at bedtime as needed (for sleep).    [provider]  DULoxetine (CYMBALTA) 60 MG capsule TAKE 1 CAPSULE BY MOUTH EVERY DAY 09/09/19   McGowen, Adrian Blackwater, MD  lisinopril (ZESTRIL) 40 MG tablet TAKE 1 TABLET BY MOUTH TWICE A DAY 09/09/19   McGowen, Adrian Blackwater, MD  QUEtiapine (SEROQUEL) 400 MG tablet TAKE 1 TABLET BY MOUTH TWICE A DAY 09/09/19   McGowen, Adrian Blackwater, MD  traZODone (DESYREL) 100 MG tablet 2 TABS BY MOUTH AT BEDTIME FOR INSOMNIA 09/13/19   McGowen, Adrian Blackwater, MD   Medication reconciliation still undergoing.  VITAL SIGNS:  Blood pressure 126/83, pulse 82, temperature 99 F (37.2 C), temperature source Oral, resp. rate 20, height 5\' 8"  (1.727 m), weight 81.6 kg, SpO2 95 %.  PHYSICAL EXAMINATION:  GENERAL:  58 y.o.-year-old patient lying  in the bed with no acute distress.  EYES: Pupils equal, round, reactive to light and accommodation. No scleral icterus. Extraocular muscles intact.  HEENT: Head atraumatic, normocephalic. Oropharynx and nasopharynx clear.  NECK:  Supple, no jugular venous distention. No thyroid enlargement, no tenderness.  LUNGS: Normal breath sounds bilaterally, no wheezing, rales,rhonchi or crepitation. No use of accessory muscles of respiration.  CARDIOVASCULAR: S1, S2 normal. No murmurs, rubs, or gallops.  ABDOMEN: Soft, nontender, nondistended. Bowel sounds present. No organomegaly or mass.  EXTREMITIES: No pedal edema, cyanosis, or clubbing.  NEUROLOGIC: Cranial nerves II through XII are intact. Muscle strength  5/5 in right extremity and 4 out of 5 in left extremity.  Left hand and wrist with some contraction from prior stroke.. Sensation intact. Gait not checked.  PSYCHIATRIC: The patient is alert and answers questions appropriately.  SKIN: No rash, lesion, or ulcer.   LABORATORY PANEL:   CBC Recent Labs  Lab 09/23/19 1451  WBC 9.8  HGB 14.3  HCT 42.4  PLT 284   ------------------------------------------------------------------------------------------------------------------  Chemistries  Recent Labs  Lab 09/23/19 1451  NA 140  K 3.4*  CL 102  CO2 25  GLUCOSE 128*  BUN 24*  CREATININE 2.33*  CALCIUM 9.5  MG 1.8  AST 21  ALT 11  ALKPHOS 92  BILITOT 0.9   ------------------------------------------------------------------------------------------------------------------   RADIOLOGY:  Dg Chest 2 View  Result Date: 09/23/2019 CLINICAL DATA:  Pt states his son brought him to the ER today, states that he has been falling and that while sitting he sees black and passes out for few seconds, pt concerned that he could be having mini strokes, states that he has had a few of those in the past and a major stroke, pt has contracture to the left arm. EXAM: CHEST - 2 VIEW COMPARISON:   03/27/2018 FINDINGS: Cardiac silhouette is normal in size. No mediastinal or hilar masses. No evidence of adenopathy. Lungs are clear.  No pleural effusion or pneumothorax. Skeletal structures are intact. IMPRESSION: No active cardiopulmonary disease. Electronically Signed   By: Lajean Manes M.D.   On: 09/23/2019 15:52   Ct Head Wo Contrast  Result Date: 09/23/2019 CLINICAL DATA:  Head trauma, minor, GCS greater than or equal to 13, high clinical risk, initial exam. C-spine trauma, high clinical risk. Additional history provided: Fall EXAM: CT HEAD WITHOUT CONTRAST CT CERVICAL SPINE WITHOUT CONTRAST TECHNIQUE: Multidetector CT imaging of the head and cervical spine was performed following the standard protocol without intravenous contrast. Multiplanar CT image reconstructions of the cervical spine were also generated. COMPARISON:  Head CT 03/28/2018,  CT cervical spine 11/03/2010 FINDINGS: CT HEAD FINDINGS Brain: There is no evidence of acute intracranial hemorrhage. No acute cortical infarct is identified. No evidence of intracranial mass. No midline shift or extra-axial fluid collection. Again demonstrated are multiple chronic cortically based infarcts within the right frontal, right parietal and left parietal-occipital lobes. Mild patchy hypodensity within the cerebral white matter is nonspecific, but consistent with chronic small vessel ischemic disease. Mild generalized parenchymal atrophy. Vascular: No hyperdense vessel Skull: Normal. Negative for fracture or focal lesion. Sinuses/Orbits: Visualized orbits demonstrate no acute abnormality. Mild mucosal thickening within the inferior frontal sinuses and within bilateral ethmoid air cells. No significant mastoid effusion. CT CERVICAL SPINE FINDINGS Alignment: Straightening of the expected cervical lordosis. Trace C4-C5 anterolisthesis. Skull base and vertebrae: The basion-dental and atlanto-dental intervals are maintained.No evidence of acute fracture to  the cervical spine. Soft tissues and spinal canal: No prevertebral fluid or swelling. No visible canal hematoma. Disc levels: Cervical spondylosis most notable at C5-C6 where there is moderate/severe disc height loss with a posterior disc osteophyte complex and left greater than right uncovertebral hypertrophy. Multilevel facet arthrosis. Upper chest: Imaged lung apices clear. Bullous change at the left lung apex. Other: Atherosclerotic calcifications within the carotid arteries. IMPRESSION: CT head: 1. No evidence of acute intracranial abnormality. 2. Redemonstrated chronic cortically based infarcts involving the right frontal, right parietal and left parietooccipital lobes. 3. Mild generalized parenchymal atrophy and chronic small vessel ischemic disease. CT cervical spine: 1. No evidence of acute fracture to the cervical  spine. 2. Cervical spondylosis greatest at C5-C6. Electronically Signed   By: Kellie Simmering DO   On: 09/23/2019 16:05   Ct Cervical Spine Wo Contrast  Result Date: 09/23/2019 CLINICAL DATA:  Head trauma, minor, GCS greater than or equal to 13, high clinical risk, initial exam. C-spine trauma, high clinical risk. Additional history provided: Fall EXAM: CT HEAD WITHOUT CONTRAST CT CERVICAL SPINE WITHOUT CONTRAST TECHNIQUE: Multidetector CT imaging of the head and cervical spine was performed following the standard protocol without intravenous contrast. Multiplanar CT image reconstructions of the cervical spine were also generated. COMPARISON:  Head CT 03/28/2018,  CT cervical spine 11/03/2010 FINDINGS: CT HEAD FINDINGS Brain: There is no evidence of acute intracranial hemorrhage. No acute cortical infarct is identified. No evidence of intracranial mass. No midline shift or extra-axial fluid collection. Again demonstrated are multiple chronic cortically based infarcts within the right frontal, right parietal and left parietal-occipital lobes. Mild patchy hypodensity within the cerebral white  matter is nonspecific, but consistent with chronic small vessel ischemic disease. Mild generalized parenchymal atrophy. Vascular: No hyperdense vessel Skull: Normal. Negative for fracture or focal lesion. Sinuses/Orbits: Visualized orbits demonstrate no acute abnormality. Mild mucosal thickening within the inferior frontal sinuses and within bilateral ethmoid air cells. No significant mastoid effusion. CT CERVICAL SPINE FINDINGS Alignment: Straightening of the expected cervical lordosis. Trace C4-C5 anterolisthesis. Skull base and vertebrae: The basion-dental and atlanto-dental intervals are maintained.No evidence of acute fracture to the cervical spine. Soft tissues and spinal canal: No prevertebral fluid or swelling. No visible canal hematoma. Disc levels: Cervical spondylosis most notable at C5-C6 where there is moderate/severe disc height loss with a posterior disc osteophyte complex and left greater than right uncovertebral hypertrophy. Multilevel facet arthrosis. Upper chest: Imaged lung apices clear. Bullous change at the left lung apex. Other: Atherosclerotic calcifications within the carotid arteries. IMPRESSION: CT head: 1. No evidence of acute intracranial abnormality. 2. Redemonstrated chronic cortically based infarcts involving the right frontal, right parietal and left parietooccipital lobes. 3. Mild generalized parenchymal atrophy and chronic small vessel ischemic disease. CT cervical spine: 1. No evidence of acute fracture to the cervical spine. 2. Cervical spondylosis greatest at C5-C6. Electronically Signed   By: Kellie Simmering DO   On: 09/23/2019 16:05   Dg Hand 2 View Left  Result Date: 09/23/2019 CLINICAL DATA:  Pt states his son brought him to the ER today, states that he has been falling and that while sitting he sees black and passes out for few seconds, pt concerned that he could be having mini strokes, states that he has had a few of those in the past and a major stroke, pt has  contracture to the left arm. EXAM: LEFT HAND - 2 VIEW COMPARISON:  None. FINDINGS: No fracture or bone lesion. Joints are normally spaced and aligned. Mild soft tissue swelling suggested dorsal to the third metacarpophalangeal joint. IMPRESSION: No fracture or dislocation. Electronically Signed   By: Lajean Manes M.D.   On: 09/23/2019 15:51    EKG:   Interpreted by me sinus rhythm 87 bpm QT 454.  IMPRESSION AND PLAN:   1.  Acute kidney injury.  IV fluid hydration.  Hold lisinopril.  Monitor BMP daily. 2.  Weakness, frequent falls, unsteady gait, prior history of stroke with left-sided weakness.  Start aspirin.  MRI brain, carotid ultrasound and echocardiogram.  PT and OT consultations.  Check a lipid panel tomorrow morning.  Start atorvastatin. 3.  ER physician was concerned about Wernicke's encephalopathy.  I  will give IV thiamine high-dose just in case this is contributing to the patient's unsteady gait.  Patient states that he stopped drinking alcohol a few years ago. 4.  Essential hypertension.  Hold lisinopril with acute kidney injury.  Check orthostatic vital signs. 5.  Hypomagnesemia.  Magnesium given in the emergency room. 6.  Hypokalemia give oral potassium 7.  Tobacco abuse.  Smoking cessation counseling done 4 minutes by me.  Nicotine patch ordered.  All the records including radiological, laboratory and EKG are reviewed and case discussed with ED provider. Management plans discussed with the patient, and he is in agreement.  Patient deferred me calling family at this time.  CODE STATUS: Full code  TOTAL TIME TAKING CARE OF THIS PATIENT: 50 minutes.    Loletha Grayer M.D on 09/23/2019 at 5:05 PM  Between 7am to 6pm - Pager - [phone]  After 6pm call admission pager [phone]  [facility]  CC: Primary care physician; Tammi Sou, MD

## 2019-09-23 NOTE — ED Triage Notes (Signed)
Pt states his son brought him to the ER today, states that he has been falling and that while sitting he sees black and passes out for few seconds, pt concerned that he could be having mini strokes, states that he has had a few of those in the past and a major stroke, pt has contracture to the left arm, pt seems somewhat disoriented, told me last episode was at 6 today, when asked am he stated no, pm, pt told that it was only 2:41 in the afternoon, pt states oh I was looking at that clock, pointing to clock in triage that shows 2:41.

## 2019-09-23 NOTE — ED Provider Notes (Signed)
[facility] Emergency Department Provider Note   ____________________________________________   First MD Initiated Contact with Patient 09/23/19 1457     (approximate)  I have reviewed the triage vital signs and the nursing notes.   HISTORY  Chief Complaint Fall    HPI [name] is a 58 y.o. male with past medical history of alcohol abuse, stroke, and COPD who presents to the ED complaining of generalized weakness and multiple falls.  Patient reports that he has fallen multiple times recently, striking his head and left side most recently.  He has not lost consciousness but states he will feel very lightheaded with blurry vision at times.  He has left-sided weakness at baseline secondary to prior stroke, states this is unchanged from prior and has not had any weakness on his right side.  He does report he feels like his speech is off and vision is blurry at times, denies any double vision.  He has not had any fevers, chills, cough, chest pain, shortness of breath, or abdominal pain.  He does state he has felt nauseous at times and vomited on a couple of occasions recently.  He has not noticed any blood in his emesis or in his stool.  He states that he has not had any alcohol to drink in about the past 7 months.  He also complains of feeling confused at times.        Past Medical History:  Diagnosis Date  . Alcoholism ([place])    Continues to abuse ETOH as of 01/2017; pt has poor insight into his problem.  Pt states that as of 04/2017 he has been 3 monthds sober.  . Anxiety and depression   . Arthritis    "hands, legs" (12/23/2017)  . Chronic joint pain   . COPD (chronic obstructive pulmonary disease) ([place])   . Depression   . Frequent headaches    "depends on BP" (12/23/2017)  . History of kidney stones   . Hypertension   . Insomnia 08/26/2017   08/23/17 ED visit for adverse rxn to taking ambien, PLUS + marijuana on UDS her and in ED---NO FURTHER AMBIEN OR  ANY OTHER CONTROLLED SUBSTANCES WILL BE PRESCRIBED BY ME--PM  . Pneumothorax, right   . Polysubstance abuse ([place])   . Poor historian    UNRELIABLE HISTORIAN  . 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  Baclofen oral no help.  Botox inj started by Dr. Posey Pronto 07/2018--no help x 1.  Inj #2 done 10/2018.  Marland Kitchen Stroke ([place]) 2012   LUE residual deficit: flexion contracture and weakness of L hand.  Old bilateral cerebral infarcts involving the right frontal and parietal lobes as well as the left occipital lobe.  Baclofen and botox via neuro for L arm contracture.  . Stroke [facility])    "3 mini strokes since 2012" (12/23/2017)  . Subdural hematoma ([place]) 01/29/2017   Subacute right-sided frontal subdural hematoma: found on CT in [place] ED, transferred to [facility] where this was non-operatively managed.  Pt admitted to the MDs at [facility] that he still drinks lots of whisky daily and they surmised that his frequent falls of late are due to this and frequent use of OTC sleep aids.  Near complete resolution on CT 03/27/18.  . Venous insufficiency of both lower extremities     Patient Active Problem List   Diagnosis Date Noted  . Syncope 09/23/2019  . Subdural hematoma (  [place]) 12/22/2017  . Depression   . TOBACCO ABUSE 11/08/2008  . C O P D 11/08/2008  . Pneumothorax 10/19/2008  . PNEUMOTHORAX 10/19/2008    Past Surgical History:  Procedure Laterality Date  . CHEST TUBE INSERTION    . LUNG SURGERY    . SHOULDER OPEN ROTATOR CUFF REPAIR Bilateral    Multiple rotator cuff surgeries on both sides.  . TONSILLECTOMY    . TYMPANOPLASTY Right   . VIDEO ASSISTED THORACOSCOPY (VATS) W/TALC PLEUADESIS  2010 X2    Prior to Admission medications   Medication Sig Start Date End Date Taking? Authorizing Provider  Doxylamine Succinate, Sleep, (SLEEP AID PO) Take 1-2 tablets by mouth at bedtime as needed (for sleep).    [provider]  DULoxetine (CYMBALTA) 60  MG capsule TAKE 1 CAPSULE BY MOUTH EVERY DAY 09/09/19   McGowen, Adrian Blackwater, MD  lisinopril (ZESTRIL) 40 MG tablet TAKE 1 TABLET BY MOUTH TWICE A DAY 09/09/19   McGowen, Adrian Blackwater, MD  QUEtiapine (SEROQUEL) 400 MG tablet TAKE 1 TABLET BY MOUTH TWICE A DAY 09/09/19   McGowen, Adrian Blackwater, MD  traZODone (DESYREL) 100 MG tablet 2 TABS BY MOUTH AT BEDTIME FOR INSOMNIA 09/13/19   McGowen, Adrian Blackwater, MD    Allergies Ambien [zolpidem]  Family History  Problem Relation Age of Onset  . Arthritis Mother   . Heart disease Father   . Hypertension Father   . Breast cancer Maternal Aunt   . Alcohol abuse Maternal Uncle   . Lung cancer Maternal Aunt   . Cancer Brother        bone    Social History Social History   Tobacco Use  . Smoking status: Current Every Day Smoker    Packs/day: 1.00    Years: 44.00    Pack years: 44.00    Types: Cigarettes  . Smokeless tobacco: Never Used  Substance Use Topics  . Alcohol use: No    Comment: Quit summer 2018. (12/23/2017)  . Drug use: Yes    Frequency: 2.0 times per week    Types: Marijuana    Comment: 12/23/2017 "no more than once/day; ave 3-4 days/wk"    Review of Systems  Constitutional: No fever/chills Eyes: Positive for blurry vision. ENT: No sore throat. Cardiovascular: Denies chest pain. Respiratory: Denies shortness of breath. Gastrointestinal: No abdominal pain.  No nausea, no vomiting.  No diarrhea.  No constipation. Genitourinary: Negative for dysuria. Musculoskeletal: Negative for back pain.  Positive for multiple falls. Skin: Negative for rash. Neurological: Negative for headaches, focal weakness or numbness.  Positive for confusion.  ____________________________________________   PHYSICAL EXAM:  VITAL SIGNS: ED Triage Vitals  Enc Vitals Group     BP --      Pulse --      Resp --      Temp --      Temp src --      SpO2 --      Weight 09/23/19 1447 180 lb (81.6 kg)     Height 09/23/19 1447 5\' 8"  (1.727 m)     Head  Circumference --      Peak Flow --      Pain Score 09/23/19 1446 8     Pain Loc --      Pain Edu? --      Excl. in [place]? --     Constitutional: Alert and oriented to person place and time, but intermittently confused. Eyes: Conjunctivae are normal.  Extraocular movements show horizontal  and vertical nystagmus. Head: Abrasion to posterior scalp, with no hematomas or step-offs.  No facial bony tenderness. Nose: No congestion/rhinnorhea. Mouth/Throat: Mucous membranes are moist. Neck: Normal ROM, no midline cervical spine tenderness. Cardiovascular: Normal rate, regular rhythm. Grossly normal heart sounds. Respiratory: Normal respiratory effort.  No retractions. Lungs CTAB. Gastrointestinal: Soft and nontender. No distention. Genitourinary: deferred Musculoskeletal: No lower extremity tenderness nor edema. Neurologic:  Normal speech and language.  Left-sided weakness with left upper extremity contracture, at patient's reported baseline.  Strength 5 out of 5 in right upper and right lower extremities. Skin:  Skin is warm, dry and intact. No rash noted. Psychiatric: Mood and affect are normal. Speech and behavior are normal.  ____________________________________________   LABS (all labs ordered are listed, but only abnormal results are displayed)  Labs Reviewed  COMPREHENSIVE METABOLIC PANEL - Abnormal; Notable for the following components:      Result Value   Potassium 3.4 (*)    Glucose, Bld 128 (*)    BUN 24 (*)    Creatinine, Ser 2.33 (*)    GFR calc non Af Amer 29 (*)    GFR calc Af Amer 34 (*)    All other components within normal limits  SARS CORONAVIRUS 2 (TAT 6-24 HRS)  CBC  ETHANOL  LIPASE, BLOOD  MAGNESIUM  URINALYSIS, COMPLETE (UACMP) WITH MICROSCOPIC  URINE DRUG SCREEN, QUALITATIVE ([facility] ONLY)  HIV ANTIBODY (ROUTINE TESTING W REFLEX)  BASIC METABOLIC PANEL  CBC  TROPONIN I (HIGH SENSITIVITY)  TROPONIN I (HIGH SENSITIVITY)    ____________________________________________  EKG  ED ECG REPORT I, Blake Divine, the attending physician, personally viewed and interpreted this ECG.   Date: 09/23/2019  EKG Time: 15:03  Rate: 87  Rhythm: normal sinus rhythm  Axis: Normal  Intervals:Prolonged QTc  ST&T Change: None   PROCEDURES  Procedure(s) performed (including Critical Care):  Procedures   ____________________________________________   INITIAL IMPRESSION / ASSESSMENT AND PLAN / ED COURSE       58 year old male presents to the ED following multiple falls this week as well as intermittent confusion and blurry vision.  He has a history of heavy alcohol abuse, but denies any recently.  Given his nystagmus on exam as well as confusion and recent ataxia, there is concern for Wernicke's encephalopathy.  Will check CT head and C-spine, plan for treatment with IV thiamine if these do not reveal a cause for his confusion.  When asked what holiday it is he states it is "Presidents' Day" and has been asking for his phone when it is sitting on his lap.  Blood pressure is also borderline low, will hydrate with IV fluids.  Suspect this is related to nutritional deficiencies as he has no fevers or infectious symptoms to suggest sepsis.  Will hydrate with IV fluids, replete electrolytes as needed.  Patient's blood pressure improved following IV fluid bolus, labs significant for AKI but imaging is negative for sequela of trauma or other acute process.  Will give patient high-dose IV thiamine for likely Warnicke's disease.  Case discussed with hospitalist for admission.      ____________________________________________   FINAL CLINICAL IMPRESSION(S) / ED DIAGNOSES  Final diagnoses:  Wernicke's disease  Multiple falls  Dehydration  AKI (acute kidney injury) [facility])     ED Discharge Orders    None       Note:  This document was prepared using Dragon voice recognition software and may include unintentional  dictation errors.   Blake Divine, MD 09/23/19 [phone]

## 2019-09-24 ENCOUNTER — Inpatient Hospital Stay: Payer: Medicare Other

## 2019-09-24 ENCOUNTER — Inpatient Hospital Stay (HOSPITAL_COMMUNITY)
Admit: 2019-09-24 | Discharge: 2019-09-24 | Disposition: A | Discharge status: Home / Self Care | Payer: Medicare Other | Attending: Internal Medicine | Admitting: Internal Medicine

## 2019-09-24 ENCOUNTER — Other Ambulatory Visit: Payer: Self-pay

## 2019-09-24 DIAGNOSIS — I639 Cerebral infarction, unspecified: Secondary | ICD-10-CM

## 2019-09-24 DIAGNOSIS — R55 Syncope and collapse: Secondary | ICD-10-CM

## 2019-09-24 DIAGNOSIS — I951 Orthostatic hypotension: Secondary | ICD-10-CM

## 2019-09-24 DIAGNOSIS — Z720 Tobacco use: Secondary | ICD-10-CM

## 2019-09-24 DIAGNOSIS — I6930 Unspecified sequelae of cerebral infarction: Secondary | ICD-10-CM

## 2019-09-24 DIAGNOSIS — R296 Repeated falls: Secondary | ICD-10-CM

## 2019-09-24 HISTORY — PX: OTHER SURGICAL HISTORY: SHX169

## 2019-09-24 HISTORY — PX: TRANSTHORACIC ECHOCARDIOGRAM: SHX275

## 2019-09-24 LAB — URINALYSIS, COMPLETE (UACMP) WITH MICROSCOPIC
Bacteria, UA: NONE SEEN
Bilirubin Urine: NEGATIVE
Glucose, UA: NEGATIVE mg/dL
Hgb urine dipstick: NEGATIVE
Ketones, ur: 5 mg/dL — AB
Leukocytes,Ua: NEGATIVE
Nitrite: NEGATIVE
Protein, ur: 30 mg/dL — AB
Specific Gravity, Urine: 1.02 (ref 1.005–1.030)
pH: 5 (ref 5.0–8.0)

## 2019-09-24 LAB — BASIC METABOLIC PANEL
Anion gap: 9 (ref 5–15)
BUN: 23 mg/dL — ABNORMAL HIGH (ref 6–20)
CO2: 23 mmol/L (ref 22–32)
Calcium: 8.9 mg/dL (ref 8.9–10.3)
Chloride: 107 mmol/L (ref 98–111)
Creatinine, Ser: 1.35 mg/dL — ABNORMAL HIGH (ref 0.61–1.24)
GFR calc Af Amer: >60 mL/min (ref >60)
GFR calc non Af Amer: 57 mL/min — ABNORMAL LOW (ref >60)
Glucose, Bld: 122 mg/dL — ABNORMAL HIGH (ref 70–99)
Potassium: 3.8 mmol/L (ref 3.5–5.1)
Sodium: 139 mmol/L (ref 135–145)

## 2019-09-24 LAB — ECHOCARDIOGRAM COMPLETE
Height: 68 in
Weight: 2528 oz

## 2019-09-24 LAB — LIPID PANEL
Cholesterol: 178 mg/dL (ref 0–200)
HDL: 49 mg/dL (ref >40)
LDL Cholesterol: 111 mg/dL — ABNORMAL HIGH (ref 0–99)
Total CHOL/HDL Ratio: 3.6 RATIO
Triglycerides: 88 mg/dL (ref <150)
VLDL: 18 mg/dL (ref 0–40)

## 2019-09-24 LAB — CBC
HCT: 38.6 % — ABNORMAL LOW (ref 39.0–52.0)
Hemoglobin: 12.9 g/dL — ABNORMAL LOW (ref 13.0–17.0)
MCH: 31.6 pg (ref 26.0–34.0)
MCHC: 33.4 g/dL (ref 30.0–36.0)
MCV: 94.6 fL (ref 80.0–100.0)
Platelets: 238 10*3/uL (ref 150–400)
RBC: 4.08 MIL/uL — ABNORMAL LOW (ref 4.22–5.81)
RDW: 12.9 % (ref 11.5–15.5)
WBC: 9.6 10*3/uL (ref 4.0–10.5)
nRBC: 0 % (ref 0.0–0.2)

## 2019-09-24 LAB — HIV ANTIBODY (ROUTINE TESTING W REFLEX): HIV Screen 4th Generation wRfx: NONREACTIVE

## 2019-09-24 MED ORDER — ALUM & MAG HYDROXIDE-SIMETH 200-200-20 MG/5ML PO SUSP
30.0000 mL | Freq: Four times a day (QID) | ORAL | Status: DC | PRN
  Administered 2019-09-24: 01:00:00 30 mL via ORAL
  Filled 2019-09-24: qty 30

## 2019-09-24 MED ORDER — PERFLUTREN LIPID MICROSPHERE
1.0000 mL | INTRAVENOUS | Status: AC | PRN
  Administered 2019-09-24: 2 mL via INTRAVENOUS
  Filled 2019-09-24: qty 10

## 2019-09-24 MED ORDER — QUETIAPINE FUMARATE 25 MG PO TABS: 200.0000 mg | ORAL_TABLET | Freq: Every day | ORAL | Status: DC

## 2019-09-24 NOTE — Progress Notes (Signed)
Patient ID: [name], male   DOB: 09/09/1961, 58 y.o.   MRN: [id number] [facility] PROGRESS NOTE  [name] [id number] DOB: [birth year] DOA: 09/23/2019 PCP: Tammi Sou, MD  HPI/Subjective: Patient still feels a little unsteady with moving around.  Patient was found to be orthostatic despite IV fluids being given continuously since yesterday.  Still feels a little bit weak.  Appetite good.  Objective: Vitals:   09/24/19 0539 09/24/19 0800  BP: (!) 138/95   Pulse: 73   Resp: 18 18  Temp: (!) 97.3 F (36.3 C) 97.9 F (36.6 C)  SpO2: 97% 97%    Intake/Output Summary (Last 24 hours) at 09/24/2019 1308 Last data filed at 09/24/2019 1000 Gross per 24 hour  Intake 712.26 ml  Output 475 ml  Net 237.26 ml   Filed Weights   09/23/19 1447 09/23/19 2116  Weight: 81.6 kg 71.7 kg    ROS: Review of Systems  Constitutional: Positive for malaise/fatigue. Negative for chills and fever.  Eyes: Negative for blurred vision.  Respiratory: Negative for cough and shortness of breath.   Cardiovascular: Negative for chest pain.  Gastrointestinal: Negative for abdominal pain, constipation, diarrhea, nausea and vomiting.  Genitourinary: Negative for dysuria.  Musculoskeletal: Negative for joint pain.  Neurological: Negative for dizziness and headaches.   Exam: Physical Exam  Constitutional: He is oriented to person, place, and time.  HENT:  Nose: No mucosal edema.  Mouth/Throat: No oropharyngeal exudate or posterior oropharyngeal edema.  Eyes: Pupils are equal, round, and reactive to light. Conjunctivae, EOM and lids are normal.  Neck: No JVD present. Carotid bruit is not present. No edema present. No thyroid mass and no thyromegaly present.  Cardiovascular: S1 normal and S2 normal. Exam reveals no gallop.  No murmur heard. Pulses:      Dorsalis pedis pulses are 2+ on the right side and 2+ on the left side.  Respiratory: No respiratory distress. He has no wheezes. He has  no rhonchi. He has no rales.  GI: Soft. Bowel sounds are normal. There is no abdominal tenderness.  Musculoskeletal:     Right ankle: He exhibits no swelling.     Left ankle: He exhibits no swelling.  Lymphadenopathy:    He has no cervical adenopathy.  Neurological: He is alert and oriented to person, place, and time. No cranial nerve deficit.  Left hand contracted.  Power left side 4 out of 5 in intensity.  Power 5 out of 5 right side.  Skin: Skin is warm. No rash noted. Nails show no clubbing.  Psychiatric: He has a normal mood and affect.      Data Reviewed: Basic Metabolic Panel: Recent Labs  Lab 09/23/19 1451 09/24/19 0627  NA 140 139  K 3.4* 3.8  CL 102 107  CO2 25 23  GLUCOSE 128* 122*  BUN 24* 23*  CREATININE 2.33* 1.35*  CALCIUM 9.5 8.9  MG 1.8  --    Liver Function Tests: Recent Labs  Lab 09/23/19 1451  AST 21  ALT 11  ALKPHOS 92  BILITOT 0.9  PROT 7.5  ALBUMIN 4.0   Recent Labs  Lab 09/23/19 1451  LIPASE 25   CBC: Recent Labs  Lab 09/23/19 1451 09/24/19 0627  WBC 9.8 9.6  HGB 14.3 12.9*  HCT 42.4 38.6*  MCV 94.9 94.6  PLT 284 238     Recent Results (from the past 240 hour(s))  SARS CORONAVIRUS 2 (TAT 6-24 HRS) Nasopharyngeal Nasopharyngeal Swab  Status: None   Collection Time: 09/23/19  3:12 PM   Specimen: Nasopharyngeal Swab  Result Value Ref Range Status   SARS Coronavirus 2 NEGATIVE NEGATIVE Final    Comment: (NOTE) SARS-CoV-2 target nucleic acids are NOT DETECTED. The SARS-CoV-2 RNA is generally detectable in upper and lower respiratory specimens during the acute phase of infection. Negative results do not preclude SARS-CoV-2 infection, do not rule out co-infections with other pathogens, and should not be used as the sole basis for treatment or other patient management decisions. Negative results must be combined with clinical observations, patient history, and epidemiological information. The expected result is  Negative. Fact Sheet for Patients: SugarRoll.be Fact Sheet for Healthcare Providers: https://www.woods-mathews.com/ This test is not yet approved or cleared by the [place] FDA and  has been authorized for detection and/or diagnosis of SARS-CoV-2 by FDA under an Emergency Use Authorization (EUA). This EUA will remain  in effect (meaning this test can be used) for the duration of the COVID-19 declaration under Section 56 4(b)(1) of the Act, 21 U.S.C. section 360bbb-3(b)(1), unless the authorization is terminated or revoked sooner. Performed at [facility], [place] [address], [place], [place] [address]      Studies: Dg Chest 2 View  Result Date: 09/23/2019 CLINICAL DATA:  Pt states his son brought him to the ER today, states that he has been falling and that while sitting he sees black and passes out for few seconds, pt concerned that he could be having mini strokes, states that he has had a few of those in the past and a major stroke, pt has contracture to the left arm. EXAM: CHEST - 2 VIEW COMPARISON:  03/27/2018 FINDINGS: Cardiac silhouette is normal in size. No mediastinal or hilar masses. No evidence of adenopathy. Lungs are clear.  No pleural effusion or pneumothorax. Skeletal structures are intact. IMPRESSION: No active cardiopulmonary disease. Electronically Signed   By: Lajean Manes M.D.   On: 09/23/2019 15:52   Ct Head Wo Contrast  Result Date: 09/23/2019 CLINICAL DATA:  Head trauma, minor, GCS greater than or equal to 13, high clinical risk, initial exam. C-spine trauma, high clinical risk. Additional history provided: Fall EXAM: CT HEAD WITHOUT CONTRAST CT CERVICAL SPINE WITHOUT CONTRAST TECHNIQUE: Multidetector CT imaging of the head and cervical spine was performed following the standard protocol without intravenous contrast. Multiplanar CT image reconstructions of the cervical spine were also generated. COMPARISON:  Head CT  03/28/2018,  CT cervical spine 11/03/2010 FINDINGS: CT HEAD FINDINGS Brain: There is no evidence of acute intracranial hemorrhage. No acute cortical infarct is identified. No evidence of intracranial mass. No midline shift or extra-axial fluid collection. Again demonstrated are multiple chronic cortically based infarcts within the right frontal, right parietal and left parietal-occipital lobes. Mild patchy hypodensity within the cerebral white matter is nonspecific, but consistent with chronic small vessel ischemic disease. Mild generalized parenchymal atrophy. Vascular: No hyperdense vessel Skull: Normal. Negative for fracture or focal lesion. Sinuses/Orbits: Visualized orbits demonstrate no acute abnormality. Mild mucosal thickening within the inferior frontal sinuses and within bilateral ethmoid air cells. No significant mastoid effusion. CT CERVICAL SPINE FINDINGS Alignment: Straightening of the expected cervical lordosis. Trace C4-C5 anterolisthesis. Skull base and vertebrae: The basion-dental and atlanto-dental intervals are maintained.No evidence of acute fracture to the cervical spine. Soft tissues and spinal canal: No prevertebral fluid or swelling. No visible canal hematoma. Disc levels: Cervical spondylosis most notable at C5-C6 where there is moderate/severe disc height loss with a posterior disc  osteophyte complex and left greater than right uncovertebral hypertrophy. Multilevel facet arthrosis. Upper chest: Imaged lung apices clear. Bullous change at the left lung apex. Other: Atherosclerotic calcifications within the carotid arteries. IMPRESSION: CT head: 1. No evidence of acute intracranial abnormality. 2. Redemonstrated chronic cortically based infarcts involving the right frontal, right parietal and left parietooccipital lobes. 3. Mild generalized parenchymal atrophy and chronic small vessel ischemic disease. CT cervical spine: 1. No evidence of acute fracture to the cervical spine. 2. Cervical  spondylosis greatest at C5-C6. Electronically Signed   By: Kellie Simmering DO   On: 09/23/2019 16:05   Ct Cervical Spine Wo Contrast  Result Date: 09/23/2019 CLINICAL DATA:  Head trauma, minor, GCS greater than or equal to 13, high clinical risk, initial exam. C-spine trauma, high clinical risk. Additional history provided: Fall EXAM: CT HEAD WITHOUT CONTRAST CT CERVICAL SPINE WITHOUT CONTRAST TECHNIQUE: Multidetector CT imaging of the head and cervical spine was performed following the standard protocol without intravenous contrast. Multiplanar CT image reconstructions of the cervical spine were also generated. COMPARISON:  Head CT 03/28/2018,  CT cervical spine 11/03/2010 FINDINGS: CT HEAD FINDINGS Brain: There is no evidence of acute intracranial hemorrhage. No acute cortical infarct is identified. No evidence of intracranial mass. No midline shift or extra-axial fluid collection. Again demonstrated are multiple chronic cortically based infarcts within the right frontal, right parietal and left parietal-occipital lobes. Mild patchy hypodensity within the cerebral white matter is nonspecific, but consistent with chronic small vessel ischemic disease. Mild generalized parenchymal atrophy. Vascular: No hyperdense vessel Skull: Normal. Negative for fracture or focal lesion. Sinuses/Orbits: Visualized orbits demonstrate no acute abnormality. Mild mucosal thickening within the inferior frontal sinuses and within bilateral ethmoid air cells. No significant mastoid effusion. CT CERVICAL SPINE FINDINGS Alignment: Straightening of the expected cervical lordosis. Trace C4-C5 anterolisthesis. Skull base and vertebrae: The basion-dental and atlanto-dental intervals are maintained.No evidence of acute fracture to the cervical spine. Soft tissues and spinal canal: No prevertebral fluid or swelling. No visible canal hematoma. Disc levels: Cervical spondylosis most notable at C5-C6 where there is moderate/severe disc height  loss with a posterior disc osteophyte complex and left greater than right uncovertebral hypertrophy. Multilevel facet arthrosis. Upper chest: Imaged lung apices clear. Bullous change at the left lung apex. Other: Atherosclerotic calcifications within the carotid arteries. IMPRESSION: CT head: 1. No evidence of acute intracranial abnormality. 2. Redemonstrated chronic cortically based infarcts involving the right frontal, right parietal and left parietooccipital lobes. 3. Mild generalized parenchymal atrophy and chronic small vessel ischemic disease. CT cervical spine: 1. No evidence of acute fracture to the cervical spine. 2. Cervical spondylosis greatest at C5-C6. Electronically Signed   By: Kellie Simmering DO   On: 09/23/2019 16:05   Mr Brain Wo Contrast  Result Date: 09/24/2019 CLINICAL DATA:  Ataxia EXAM: MRI HEAD WITHOUT CONTRAST TECHNIQUE: Multiplanar, multiecho pulse sequences of the brain and surrounding structures were obtained without intravenous contrast. COMPARISON:  2012 FINDINGS: Brain: There is no acute infarction or hemorrhage. Multiple chronic infarcts are again identified including involvement of the right frontal, parietal, and temporal lobes and left occipital lobe. There is a chronic small vessel infarct of the right caudate. There is volume loss and chronic blood products associated with these infarcts. Additional patchy T2 hyperintensity in the supratentorial white matter likely reflects chronic microvascular ischemic changes. There is no intracranial mass, mass effect, edema, or extra-axial fluid collection. Vascular: Major vessel flow voids at the skull base are preserved. Skull and upper cervical  spine: Normal marrow signal is preserved. Sinuses/Orbits: Mild paranasal sinus mucosal thickening. Orbits are unremarkable. Other: None. IMPRESSION: No acute infarction, intracranial hemorrhage, or mass. Multiple chronic infarcts with progression since 2012. Electronically Signed   By: Macy Mis M.D.   On: 09/24/2019 09:38   Dg Hand 2 View Left  Result Date: 09/23/2019 CLINICAL DATA:  Pt states his son brought him to the ER today, states that he has been falling and that while sitting he sees black and passes out for few seconds, pt concerned that he could be having mini strokes, states that he has had a few of those in the past and a major stroke, pt has contracture to the left arm. EXAM: LEFT HAND - 2 VIEW COMPARISON:  None. FINDINGS: No fracture or bone lesion. Joints are normally spaced and aligned. Mild soft tissue swelling suggested dorsal to the third metacarpophalangeal joint. IMPRESSION: No fracture or dislocation. Electronically Signed   By: Lajean Manes M.D.   On: 09/23/2019 15:51   US Carotid Bilateral  Result Date: 09/24/2019 CLINICAL DATA:  CVA. History of hypertension, smoking and visual disturbance. EXAM: BILATERAL CAROTID DUPLEX ULTRASOUND TECHNIQUE: Pearline Cables scale imaging, color Doppler and duplex ultrasound were performed of bilateral carotid and vertebral arteries in the neck. COMPARISON:  None. FINDINGS: Criteria: Quantification of carotid stenosis is based on velocity parameters that correlate the residual internal carotid diameter with NASCET-based stenosis levels, using the diameter of the distal internal carotid lumen as the denominator for stenosis measurement. The following velocity measurements were obtained: RIGHT ICA: 69/28 cm/sec CCA: 0000000 cm/sec SYSTOLIC ICA/CCA RATIO:  1.2 ECA: 69 cm/sec LEFT ICA: 76/35 cm/sec CCA: 0000000 cm/sec SYSTOLIC ICA/CCA RATIO:  1.2 ECA: 87 cm/sec RIGHT CAROTID ARTERY: There is a minimal amount of eccentric echogenic plaque involving the mid and distal aspect of the right common carotid artery (images 7 and 11). There is a minimal amount of eccentric echogenic plaque within the right carotid bulb (image 15), extending to involve the origin and proximal aspects of the right internal carotid artery (image 22), not resulting in elevated  peak systolic velocities within the interrogated course of the right internal carotid artery to suggest a hemodynamically significant stenosis. RIGHT VERTEBRAL ARTERY:  Antegrade flow LEFT CAROTID ARTERY: There is a minimal amount of intimal thickening/hypoechoic plaque seen throughout the left common carotid artery. There is a minimal amount of intimal thickening/mixed echogenic plaque involving the left carotid bulb (image 46), not resulting in elevated peak systolic velocities within the interrogated course of the left internal carotid artery to suggest a hemodynamically significant stenosis. LEFT VERTEBRAL ARTERY:  Antegrade flow IMPRESSION: Minimal amount of bilateral atherosclerotic plaque right subjectively greater than left, not resulting in a hemodynamically significant stenosis within either internal carotid artery. Electronically Signed   By: Sandi Mariscal M.D.   On: 09/24/2019 10:51    Scheduled Meds: . aspirin EC  81 mg Oral Daily  . atorvastatin  40 mg Oral q1800  . DULoxetine  60 mg Oral Daily  . enoxaparin (LOVENOX) injection  40 mg Subcutaneous Q24H  . nicotine  21 mg Transdermal Daily  . [START ON 09/25/2019] QUEtiapine  200 mg Oral QHS   Continuous Infusions: . sodium chloride 75 mL/hr at 09/24/19 1021  . thiamine injection 500 mg (09/24/19 1101)    Assessment/Plan:  1. Acute kidney injury.  Continue IV fluid hydration.  Hold lisinopril.  Creatinine improved from 2.33 down to 1.35. 2. Orthostatic hypotension and weakness.  Continue IV fluid hydration.  Continue to hold lisinopril. 3. History of prior stroke with left-sided weakness.  MRI of the brain negative for acute event.  Carotid ultrasound negative.  Echocardiogram still pending.  Physical therapy evaluation.  We will continue aspirin and atorvastatin anyway. 4. ER physician concerned about Warnicke's encephalopathy.  I will continue high-dose thiamine for right now but patient is mentating well. 5. Hypomagnesemia  replaced in the emergency room 6. Hypokalemia.  This was replaced. 7. Tobacco abuse on nicotine patch.  Code Status:     Code Status Orders  (From admission, onward)         Start     Ordered   09/23/19 1701  Full code  Continuous     09/23/19 1701        Code Status History    Date Active Date Inactive Code Status Order ID Comments User Context   12/22/2017 0132 12/25/2017 0058 Full Code [id number]  Ashok Pall, MD Inpatient   Advance Care Planning Activity     Family Communication: Patient refused me calling family at this point Disposition Plan: To be determined  Time spent: 28 minutes  [place]

## 2019-09-24 NOTE — Progress Notes (Signed)
Pt has positive orthostatic VS. Pt reported dizziness when stood up at the side of the bed. Dizziness subside.  Dr Leslye Peer made aware.  Continue with IVF.

## 2019-09-24 NOTE — Progress Notes (Signed)
PT Cancellation Note  Patient Details Name: [name] MRN: [id number] DOB: 1961-03-13   Cancelled Treatment:    Reason Eval/Treat Not Completed: Patient declined, no reason specified. Evaluation attempted x 2 with patient refusing both times. Will re-attempt later today as time allows or tomorrow.     Eugenia Eldredge 09/24/2019, 12:12 PM

## 2019-09-24 NOTE — Plan of Care (Signed)
  Problem: Education: Goal: Knowledge of secondary prevention will improve Outcome: Progressing Goal: Knowledge of patient specific risk factors addressed and post discharge goals established will improve Outcome: Progressing   Problem: Health Behavior/Discharge Planning: Goal: Ability to manage health-related needs will improve Outcome: Progressing   Problem: Self-Care: Goal: Ability to participate in self-care as condition permits will improve Outcome: Progressing Goal: Verbalization of feelings and concerns over difficulty with self-care will improve Outcome: Progressing Goal: Ability to communicate needs accurately will improve Outcome: Progressing   Problem: Nutrition: Goal: Risk of aspiration will decrease Outcome: Progressing   Problem: Ischemic Stroke/TIA Tissue Perfusion: Goal: Complications of ischemic stroke/TIA will be minimized Outcome: Progressing   Problem: Health Behavior/Discharge Planning: Goal: Ability to manage health-related needs will improve Outcome: Progressing   Problem: Clinical Measurements: Goal: Ability to maintain clinical measurements within normal limits will improve Outcome: Progressing Goal: Will remain free from infection Outcome: Progressing Goal: Diagnostic test results will improve Outcome: Progressing Goal: Respiratory complications will improve Outcome: Progressing   Problem: Activity: Goal: Risk for activity intolerance will decrease Outcome: Progressing   Problem: Nutrition: Goal: Adequate nutrition will be maintained Outcome: Progressing   Problem: Coping: Goal: Level of anxiety will decrease Outcome: Progressing   Problem: Elimination: Goal: Will not experience complications related to bowel motility Outcome: Progressing Goal: Will not experience complications related to urinary retention Outcome: Progressing   Problem: Pain Managment: Goal: General experience of comfort will improve Outcome: Progressing    Problem: Safety: Goal: Ability to remain free from injury will improve Outcome: Progressing   Problem: Skin Integrity: Goal: Risk for impaired skin integrity will decrease Outcome: Progressing

## 2019-09-24 NOTE — Progress Notes (Signed)
OT Cancellation Note  Patient Details Name: [name] MRN: [id number] DOB: [birth year]   Cancelled Treatment:    Reason Eval/Treat Not Completed: Patient declined, no reason specified  Order received and chart reviewed.  Pt declined OT evaluation and offered to come back later today and he declined that as well and asked for therapist to come back tomorrow. Will attempt OT evaluation again tomorrow.  Thank you for the referral.  Chrys Racer, OTR/L, [place] [id number] 09/24/19, 12:28 PM

## 2019-09-24 NOTE — Progress Notes (Signed)
*  PRELIMINARY RESULTS* Echocardiogram 2D Echocardiogram has been performed.  [name] [name] [name] 09/24/2019, 11:43 AM

## 2019-09-25 LAB — BASIC METABOLIC PANEL
Anion gap: 11 (ref 5–15)
BUN: 15 mg/dL (ref 6–20)
CO2: 18 mmol/L — ABNORMAL LOW (ref 22–32)
Calcium: 8.6 mg/dL — ABNORMAL LOW (ref 8.9–10.3)
Chloride: 110 mmol/L (ref 98–111)
Creatinine, Ser: 0.99 mg/dL (ref 0.61–1.24)
GFR calc Af Amer: >60 mL/min (ref >60)
GFR calc non Af Amer: >60 mL/min (ref >60)
Glucose, Bld: 94 mg/dL (ref 70–99)
Potassium: 4 mmol/L (ref 3.5–5.1)
Sodium: 139 mmol/L (ref 135–145)

## 2019-09-25 MED ORDER — NICOTINE 21 MG/24HR TD PT24: MEDICATED_PATCH | TRANSDERMAL | 0 refills | Status: DC

## 2019-09-25 MED ORDER — VITAMIN B-1 100 MG PO TABS: 100.0000 mg | ORAL_TABLET | Freq: Every day | ORAL | 0 refills | Status: DC

## 2019-09-25 MED ORDER — ATORVASTATIN CALCIUM 40 MG PO TABS: 40.0000 mg | ORAL_TABLET | Freq: Every day | ORAL | 0 refills | Status: DC

## 2019-09-25 MED ORDER — QUETIAPINE FUMARATE 200 MG PO TABS: 200.0000 mg | ORAL_TABLET | Freq: Every day | ORAL | 0 refills | Status: DC

## 2019-09-25 MED ORDER — MIDODRINE HCL 2.5 MG PO TABS: 2.5000 mg | ORAL_TABLET | Freq: Three times a day (TID) | ORAL | 0 refills | Status: DC

## 2019-09-25 MED ORDER — MIDODRINE HCL 5 MG PO TABS
2.5000 mg | ORAL_TABLET | Freq: Three times a day (TID) | ORAL | Status: DC
  Administered 2019-09-25: 2.5 mg via ORAL
  Filled 2019-09-25: qty 1

## 2019-09-25 MED ORDER — ASPIRIN 81 MG PO TBEC: 81.0000 mg | DELAYED_RELEASE_TABLET | Freq: Every day | ORAL | 0 refills | Status: DC

## 2019-09-25 NOTE — Discharge Instructions (Addendum)
Cut back on drinking moutain dew to one can per day  With follow up visit, must check orthostatic vital signs

## 2019-09-25 NOTE — Evaluation (Signed)
Physical Therapy Evaluation Patient Details Name: [name] MRN: [id number] DOB: Jan 30, 1961 Today's Date: 09/25/2019   History of Present Illness  58 y.o. male who presented to the ER with multiple falls over the last few days.  He has a history of stroke and mini strokes and he states that this is what happened previously.  He was found to be in acute kidney injury. The patient states that he does have some blurry vision bilaterally.  Clinical Impression  Pt did well with PT exam and though she showed mild unsteadiness at times he generally did well.  He was able to ambulate ~250 ft and negotiate up/down steps w/o AD (though PT did recommend using SPC initially until he has been able to work with PT on gait/balance at home).  Pt with mild fatigue with the effort but overall did well and does not report feeling too far from his baseline.  Pt with no dizziness, lightheadedness or LOBs t/o the effort.    Follow Up Recommendations Home health PT    Equipment Recommendations  None recommended by PT(has SPC and QC)    Recommendations for Other Services       Precautions / Restrictions Precautions Precautions: Fall Restrictions Weight Bearing Restrictions: No      Mobility  Bed Mobility Overal bed mobility: Independent             General bed mobility comments: Pt able to get to sitting EOB easily and w/o assist  Transfers Overall transfer level: Modified independent Equipment used: None             General transfer comment: Pt able to rise to standing w/o hesitation, used UEs to rise but not to stabilize once up  Ambulation/Gait Ambulation/Gait assistance: Modified independent (Device/Increase time) Gait Distance (Feet): 250 Feet Assistive device: None       General Gait Details: Pt was able to circumambulate the nurses' station with good speed and confidence.  No LOBs but minimal and infrequent unsteadiness with changes in directions, etc.  Though pt did not have  any LOBs given mild unsteadiness PT recommended he use SPC at this time.  Stairs Stairs: Yes Stairs assistance: Supervision Stair Management: No rails;Forwards Number of Stairs: 4 General stair comments: Pt was able to negotiate up/down steps with reciprocal pattern.  He did do this quickly despsite cues to slow and possibly do only 1 step at a time, he reached to rail briefly X1 but did not have overt safety concnerns.  Wheelchair Mobility    Modified Rankin (Stroke Patients Only)       Balance Overall balance assessment: Mild deficits observed, not formally tested                                           Pertinent Vitals/Pain Pain Assessment: No/denies pain    Home Living Family/patient expects to be discharged to:: Private residence Living Arrangements: Children Available Help at Discharge: Family;Available PRN/intermittently Type of Home: House Home Access: Stairs to enter Entrance Stairs-Rails: None Entrance Stairs-Number of Steps: 4 Home Layout: One level Home Equipment: Cane - single point;Shower seat Additional Comments: Rec pt purchase a shower chair with back to prevent falls in walk in shower.    Prior Function Level of Independence: Independent with assistive device(s)         Comments: Pt was at home living with his son and  did not need any assistance with ADLs but required more time due to one handed tech.  He was not using any AD at home prior to admission.     Hand Dominance   Dominant Hand: Right    Extremity/Trunk Assessment   Upper Extremity Assessment Upper Extremity Assessment: Defer to OT evaluation(very limited L UE AROM since recent CVA, R WNL) LUE Deficits / Details: Pt with LUE hemi-paresis from CVA over a year ago with wrist and fingers contractures with painful PROM in extension. LUE Sensation: WNL LUE Coordination: decreased fine motor;decreased gross motor    Lower Extremity Assessment Lower Extremity  Assessment: Overall WFL for tasks assessed       Communication   Communication: HOH  Cognition Arousal/Alertness: Awake/alert Behavior During Therapy: WFL for tasks assessed/performed;Impulsive Overall Cognitive Status: Within Functional Limits for tasks assessed                                        General Comments      Exercises     Assessment/Plan    PT Assessment Patient needs continued PT services  PT Problem List Decreased strength;Decreased balance;Decreased safety awareness;Decreased activity tolerance;Decreased range of motion;Decreased mobility;Decreased knowledge of use of DME       PT Treatment Interventions DME instruction;Gait training;Stair training;Functional mobility training;Therapeutic activities;Therapeutic exercise;Balance training;Neuromuscular re-education;Patient/family education    PT Goals (Current goals can be found in the Care Plan section)  Acute Rehab PT Goals Patient Stated Goal: to go home today PT Goal Formulation: With patient Time For Goal Achievement: 10/09/19 Potential to Achieve Goals: Good    Frequency Min 2X/week   Barriers to discharge        Co-evaluation               AM-PAC PT "6 Clicks" Mobility  Outcome Measure Help needed turning from your back to your side while in a flat bed without using bedrails?: None Help needed moving from lying on your back to sitting on the side of a flat bed without using bedrails?: None Help needed moving to and from a bed to a chair (including a wheelchair)?: None Help needed standing up from a chair using your arms (e.g., wheelchair or bedside chair)?: None Help needed to walk in hospital room?: A Little Help needed climbing 3-5 steps with a railing? : A Little 6 Click Score: 22    End of Session Equipment Utilized During Treatment: Gait belt Activity Tolerance: Patient tolerated treatment well Patient left: with bed alarm set;with call bell/phone within  reach Nurse Communication: Mobility status PT Visit Diagnosis: Unsteadiness on feet (R26.81);Difficulty in walking, not elsewhere classified (R26.2);Muscle weakness (generalized) (M62.81)    Time: [id number] PT Time Calculation (min) (ACUTE ONLY): 15 min   Charges:   PT Evaluation $PT Eval Low Complexity: 1 Low          Kreg Shropshire, DPT 09/25/2019, 1:11 PM

## 2019-09-25 NOTE — Evaluation (Addendum)
Occupational Therapy Evaluation Patient Details Name: [name] MRN: [id number] DOB: 01/31/1961 Today's Date: 09/25/2019    History of Present Illness [name] is a 58 y.o. male presents to the ER with falling over the last few days.  He has a history of stroke and mini strokes and he states that this is what happened previously.  One of the episodes today he got into the hall and tripped and fell and landed on his left arm and right face.  He had 5-6 falls over the last few days.  He states one episode where he stood up quickly and he blacked out and ended up on the ground.  The patient is not the best historian and he did not want me to call any family at this time.  In the ER, he was found to be in acute kidney injury. The patient states that he does have some blurry vision bilaterally.   Clinical Impression   Pt is 58 year old male who lives at home with his son who is home 24/7 since [place] due to losing his job and able to help his father as needed.  Pt presents with new onset of falls and acute kidney injury and history of CVA affecting L non-dominant UE and LE (see above for presenting problems).  Pt presents with a flat affect and eager to go home with encouragement needed to participate in evaluation.  He decline full vision assessment due to c/o blurry vision but did agree to ADL assessment.  He is impulsive and requires min assist and mod cues for ambulating to toilet to stand to urinate holding onto IV pole but no AD.  He also needed min assist for LB dressing for one handed tech and educated in adaptive devices to increase safety and independence esp with self feeding and a shower chair with back and grab bars to prevent falls when showering in shower stall. Pt's vitals monitored with BP 115/83 and pulse 70 and sats 96% lying in bed and after being up to bathroom and completing ADLs at EOB BP 112/86 with pulse 70 and O2 sats 97%.   Rec continued OT while in hospital to continue to work on  increasing independence in ADLs, balance and functional mobility training, coordination exercises and family ed and training and OT [facility] after discharge.    Follow Up Recommendations  Home health OT    Equipment Recommendations  Tub/shower seat(adaptive aids for feeding)    Recommendations for Other Services       Precautions / Restrictions Precautions Precautions: Fall Restrictions Weight Bearing Restrictions: No      Mobility Bed Mobility                  Transfers                      Balance                                           ADL either performed or assessed with clinical judgement   ADL Overall ADL's : Needs assistance/impaired Eating/Feeding: Set up;Minimal assistance;Sitting Eating/Feeding Details (indicate cue type and reason): to open packages that require both hands. Educated in [place] aids to help with self feeding one handed and given AD catalog with rec. Grooming: Wash/dry hands;Wash/dry face;Oral care;Minimal assistance;Set up;Sitting  Upper Body Dressing : Set up;Minimal assistance;Sitting   Lower Body Dressing: Minimal assistance;Set up;Sit to/from stand Lower Body Dressing Details (indicate cue type and reason): cues for safety due to mild impulsivity with sit to stand and hx of low BP. Toilet Transfer: Supervision/safety;Set up;Regular Toilet;Stand-pivot Toilet Transfer Details (indicate cue type and reason): Pt ambulated to toilet with min guard and mod cues for safety holding onto IV pole and stood to urinate with distant supervison.         Functional mobility during ADLs: Min guard;Cueing for safety       Vision Patient Visual Report: Blurring of vision Additional Comments: Pt with hx of blurry vision since CVA over a year ago but does not present with any overt changes duriing functional ADLs and pt declined full assessment due to wanting to lay back down in bed and go home to watch the  races on TV later today.     Perception     Praxis      Pertinent Vitals/Pain Pain Assessment: No/denies pain     Hand Dominance Right   Extremity/Trunk Assessment Upper Extremity Assessment Upper Extremity Assessment: LUE deficits/detail LUE Deficits / Details: Pt with LUE hemi-paresis from CVA over a year ago with wrist and fingers contractures with painful PROM in extension. LUE Sensation: WNL LUE Coordination: decreased fine motor;decreased gross motor           Communication Communication Communication: HOH   Cognition Arousal/Alertness: Awake/alert Behavior During Therapy: WFL for tasks assessed/performed;Impulsive Overall Cognitive Status: Within Functional Limits for tasks assessed                                     General Comments       Exercises     Shoulder Instructions      Home Living Family/patient expects to be discharged to:: Private residence Living Arrangements: Alone   Type of Home: House Home Access: Stairs to enter [employer] of Steps: 4 Entrance Stairs-Rails: None Home Layout: One level     Bathroom Shower/Tub: Walk-in shower;Door   [employer] Toilet: Standard         Additional Comments: Rec pt purchase a shower chair with back to prevent falls in walk in shower.      Prior Functioning/Environment Level of Independence: Independent        Comments: Pt was at home living with his son and did not need any assistance with ADLs but required more time due to one handed tech.  He was not using any AD at home prior to admission.        OT Problem List: Decreased strength;Decreased range of motion;Decreased activity tolerance;Impaired balance (sitting and/or standing);Impaired vision/perception      OT Treatment/Interventions: Self-care/ADL training;Patient/family education;Therapeutic activities    OT Goals(Current goals can be found in the care plan section) Acute Rehab OT Goals Patient Stated  Goal: to go home today OT Goal Formulation: With patient Time For Goal Achievement: 10/09/19 Potential to Achieve Goals: Good ADL Goals Pt Will Perform Grooming: with set-up;sitting;Independently Pt Will Perform Upper Body Dressing: with min assist;with set-up;sitting Pt Will Perform Lower Body Dressing: with set-up;with min assist;sit to/from stand Pt Will Transfer to Toilet: with set-up;stand pivot transfer;with supervision  OT Frequency: Min 2X/week   Barriers to D/C:            Co-evaluation  AM-PAC OT "6 Clicks" Daily Activity     Outcome Measure Help from another person eating meals?: A Little Help from another person taking care of personal grooming?: None Help from another person toileting, which includes using toliet, bedpan, or urinal?: A Little Help from another person bathing (including washing, rinsing, drying)?: A Little Help from another person to put on and taking off regular upper body clothing?: A Little Help from another person to put on and taking off regular lower body clothing?: A Little 6 Click Score: 19   End of Session Equipment Utilized During Treatment: Gait belt Nurse Communication: (pt told daughter to come pick him up today at 1:00pm and not clear if DC is today or tomorrow per chart and NSG updated.)  Activity Tolerance: Patient tolerated treatment well Patient left: in bed;with call bell/phone within reach;with bed alarm set  OT Visit Diagnosis: Unsteadiness on feet (R26.81);Repeated falls (R29.6);Muscle weakness (generalized) (M62.81);History of falling (Z91.81);Other (comment)(L hemiplegia prior to admission)                Time: 1018-1050 OT Time Calculation (min): 32 min Charges:  OT General Charges $OT Visit: 1 Visit OT Evaluation $OT Eval Low Complexity: 1 Low OT Treatments $Self Care/Home Management : 23-37 mins  Chrys Racer, OTR/L, [place] ascom [phone] 09/25/19, 11:52 AM

## 2019-09-25 NOTE — Discharge Summary (Signed)
[facility] NAME: [name]    MR#:  [id number]  DATE OF BIRTH:  August 09, 1961  DATE OF ADMISSION:  09/23/2019 ADMITTING PHYSICIAN: Loletha Grayer, MD  DATE OF DISCHARGE: 09/25/2019  PRIMARY CARE PHYSICIAN: Tammi Sou, MD    ADMISSION DIAGNOSIS:  Dehydration [E86.0] CVA (cerebral vascular accident) ([place]) [I63.9] Wernicke's disease [E51.2] AKI (acute kidney injury) ([place]) [N17.9] Multiple falls [R29.6]  DISCHARGE DIAGNOSIS:  Active Problems:   Tobacco abuse   Syncope   Wernicke's disease   Orthostatic hypotension   Multiple falls   History of CVA with residual deficit   SECONDARY DIAGNOSIS:   Past Medical History:  Diagnosis Date  . Alcoholism ([place])    Continues to abuse ETOH as of 01/2017; pt has poor insight into his problem.  Pt states that as of 04/2017 he has been 3 monthds sober.  . Anxiety and depression   . Arthritis    "hands, legs" (12/23/2017)  . Chronic joint pain   . COPD (chronic obstructive pulmonary disease) ([place])   . Depression   . Frequent headaches    "depends on BP" (12/23/2017)  . History of kidney stones   . Hypertension   . Insomnia 08/26/2017   08/23/17 ED visit for adverse rxn to taking ambien, PLUS + marijuana on UDS her and in ED---NO FURTHER AMBIEN OR ANY OTHER CONTROLLED SUBSTANCES WILL BE PRESCRIBED BY ME--PM  . Pneumothorax, right   . Polysubstance abuse ([place])   . Poor historian    UNRELIABLE HISTORIAN  . 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  Baclofen oral no help.  Botox inj started by Dr. Posey Pronto 07/2018--no help x 1.  Inj #2 done 10/2018.  Marland Kitchen Stroke ([place]) 2012   LUE residual deficit: flexion contracture and weakness of L hand.  Old bilateral cerebral infarcts involving the right frontal and parietal lobes as well as the left occipital lobe.  Baclofen and botox via neuro for L arm contracture.  . Stroke [facility])    "3 mini  strokes since 2012" (12/23/2017)  . Subdural hematoma ([place]) 01/29/2017   Subacute right-sided frontal subdural hematoma: found on CT in [place] ED, transferred to [facility] where this was non-operatively managed.  Pt admitted to the MDs at [facility] that he still drinks lots of whisky daily and they surmised that his frequent falls of late are due to this and frequent use of OTC sleep aids.  Near complete resolution on CT 03/27/18.  . Venous insufficiency of both lower extremities     HOSPITAL COURSE:   1.  Acute kidney injury.  The patient's creatinine was elevated at 2.33 when he came in and now down to 0.99 upon discharge.  Lisinopril discontinued. 2.  Orthostatic hypotension and weakness.  We gave IV fluids during the entire hospital course.  I held his lisinopril.  I also advised him to use TED hose.  I advised him to sit at the edge of the bed for 5 minutes before standing up and walking.  Anytime he feels funny he will need to sit down.  The patient was still orthostatic but he wanted to go home so I did prescribe midodrine 2.5 mg 3 times daily.  Recommend following up with PMD and follow-up appointment and checking orthostatics.  Hopefully the patient will be able to come off the midodrine soon.  I also advised him to stay hydrated.  The  patient drinks a lot of Mountain Dew soda which is likely a contributing cause to the patient's orthostatic hypotension. 3.  History of prior stroke with left-sided weakness.  MRI of the brain was negative for acute event.  Did have old strokes.  Carotid ultrasound negative.  Echocardiogram normal ejection fraction.  Physical therapy recommended home with home health.  For his prior stroke aspirin and atorvastatin prescribed. 4.  The ER physician was concerned about Wernicke's encephalopathy.  I do not think that this is the case.  The patient's mental status was good for me throughout the entire hospital course.  I did give him IV thiamine because sometimes people with history  of alcohol use can have problems with their balance.  Continue oral thiamine as outpatient. 5.  Hypomagnesemia and hypokalemia replaced during the hospital course. 6.  Tobacco abuse.  On nicotine patch while here in the hospital and prescribed as outpatient also.  DISCHARGE CONDITIONS:   Fair  CONSULTS OBTAINED:  Physical therapy  DRUG ALLERGIES:   Allergies  Allergen Reactions  . Ambien [Zolpidem] Other (See Comments)    Pt took too much, acted out a bad dream/was throwing things, etc--ED visit (07/2017)    DISCHARGE MEDICATIONS:   Allergies as of 09/25/2019      Reactions   Ambien [zolpidem] Other (See Comments)   Pt took too much, acted out a bad dream/was throwing things, etc--ED visit (07/2017)      Medication List    STOP taking these medications   lisinopril 40 MG tablet Commonly known as: ZESTRIL     TAKE these medications   aspirin 81 MG EC tablet Take 1 tablet (81 mg total) by mouth daily. Start taking on: September 26, 2019   atorvastatin 40 MG tablet Commonly known as: LIPITOR Take 1 tablet (40 mg total) by mouth daily at 6 PM.   DULoxetine 60 MG capsule Commonly known as: CYMBALTA TAKE 1 CAPSULE BY MOUTH EVERY DAY What changed:   how much to take  how to take this  when to take this  additional instructions   midodrine 2.5 MG tablet Commonly known as: PROAMATINE Take 1 tablet (2.5 mg total) by mouth 3 (three) times daily with meals.   nicotine 21 mg/24hr patch Commonly known as: NICODERM CQ - dosed in mg/24 hours One 21mg  patch chest wall daily (may substitute generic)   QUEtiapine 200 MG tablet Commonly known as: SEROQUEL Take 1 tablet (200 mg total) by mouth at bedtime. What changed:   medication strength  how much to take  when to take this   thiamine 100 MG tablet Commonly known as: VITAMIN B-1 Take 1 tablet (100 mg total) by mouth daily.        DISCHARGE INSTRUCTIONS:   Follow-up PMD 5 days  If you experience  worsening of your admission symptoms, develop shortness of breath, life threatening emergency, suicidal or homicidal thoughts you must seek medical attention immediately by calling 911 or calling your MD immediately  if symptoms less severe.  You Must read complete instructions/literature along with all the possible adverse reactions/side effects for all the Medicines you take and that have been prescribed to you. Take any new Medicines after you have completely understood and accept all the possible adverse reactions/side effects.   Please note  You were cared for by a hospitalist during your hospital stay. If you have any questions about your discharge medications or the care you received while you were in the hospital after you are  discharged, you can call the unit and asked to speak with the hospitalist on call if the hospitalist that took care of you is not available. Once you are discharged, your primary care physician will handle any further medical issues. Please note that NO REFILLS for any discharge medications will be authorized once you are discharged, as it is imperative that you return to your primary care physician (or establish a relationship with a primary care physician if you do not have one) for your aftercare needs so that they can reassess your need for medications and monitor your lab values.    Today   CHIEF COMPLAINT:   Chief Complaint  Patient presents with  . Fall    HISTORY OF PRESENT ILLNESS:  Maryan Mcfeaters  is a 58 y.o. male presenting with numerous falls over the last few days   VITAL SIGNS:  Blood pressure 112/86, pulse 70, temperature 98.3 F (36.8 C), temperature source Oral, resp. rate 18, height 5\' 8"  (1.727 m), weight 71.7 kg, SpO2 97 %.  I/O:    Intake/Output Summary (Last 24 hours) at 09/25/2019 1325 Last data filed at 09/25/2019 0300 Gross per 24 hour  Intake 1186.45 ml  Output 450 ml  Net 736.45 ml    PHYSICAL EXAMINATION:  GENERAL:  58  y.o.-year-old patient lying in the bed with no acute distress.  EYES: Pupils equal, round, reactive to light and accommodation. No scleral icterus. Extraocular muscles intact.  HEENT: Head atraumatic, normocephalic. Oropharynx and nasopharynx clear.  NECK:  Supple, no jugular venous distention. No thyroid enlargement, no tenderness.  LUNGS: Normal breath sounds bilaterally, no wheezing, rales,rhonchi or crepitation. No use of accessory muscles of respiration.  CARDIOVASCULAR: S1, S2 normal. No murmurs, rubs, or gallops.  ABDOMEN: Soft, non-tender, non-distended. Bowel sounds present. No organomegaly or mass.  EXTREMITIES: No pedal edema, cyanosis, or clubbing.  NEUROLOGIC: Cranial nerves II through XII are intact. Sensation intact. Gait not checked.  Left hand contracted with prior stroke PSYCHIATRIC: The patient is alert and oriented x 3.  SKIN: No obvious rash, lesion, or ulcer.  Bruising and scrapes over the right face.  DATA REVIEW:   CBC Recent Labs  Lab 09/24/19 0627  WBC 9.6  HGB 12.9*  HCT 38.6*  PLT 238    Chemistries  Recent Labs  Lab 09/23/19 1451  09/25/19 0548  NA 140   < > 139  K 3.4*   < > 4.0  CL 102   < > 110  CO2 25   < > 18*  GLUCOSE 128*   < > 94  BUN 24*   < > 15  CREATININE 2.33*   < > 0.99  CALCIUM 9.5   < > 8.6*  MG 1.8  --   --   AST 21  --   --   ALT 11  --   --   ALKPHOS 92  --   --   BILITOT 0.9  --   --    < > = values in this interval not displayed.    Microbiology Results  Results for orders placed or performed during the hospital encounter of 09/23/19  SARS CORONAVIRUS 2 (TAT 6-24 HRS) Nasopharyngeal Nasopharyngeal Swab     Status: None   Collection Time: 09/23/19  3:12 PM   Specimen: Nasopharyngeal Swab  Result Value Ref Range Status   SARS Coronavirus 2 NEGATIVE NEGATIVE Final    Comment: (NOTE) SARS-CoV-2 target nucleic acids are NOT DETECTED. The SARS-CoV-2 RNA is generally  detectable in upper and lower respiratory specimens  during the acute phase of infection. Negative results do not preclude SARS-CoV-2 infection, do not rule out co-infections with other pathogens, and should not be used as the sole basis for treatment or other patient management decisions. Negative results must be combined with clinical observations, patient history, and epidemiological information. The expected result is Negative. Fact Sheet for Patients: SugarRoll.be Fact Sheet for Healthcare Providers: https://www.woods-mathews.com/ This test is not yet approved or cleared by the [place] FDA and  has been authorized for detection and/or diagnosis of SARS-CoV-2 by FDA under an Emergency Use Authorization (EUA). This EUA will remain  in effect (meaning this test can be used) for the duration of the COVID-19 declaration under Section 56 4(b)(1) of the Act, 21 U.S.C. section 360bbb-3(b)(1), unless the authorization is terminated or revoked sooner. Performed at [facility], [place] [address], [place], [place] [address]     RADIOLOGY:  Dg Chest 2 View  Result Date: 09/23/2019 CLINICAL DATA:  Pt states his son brought him to the ER today, states that he has been falling and that while sitting he sees black and passes out for few seconds, pt concerned that he could be having mini strokes, states that he has had a few of those in the past and a major stroke, pt has contracture to the left arm. EXAM: CHEST - 2 VIEW COMPARISON:  03/27/2018 FINDINGS: Cardiac silhouette is normal in size. No mediastinal or hilar masses. No evidence of adenopathy. Lungs are clear.  No pleural effusion or pneumothorax. Skeletal structures are intact. IMPRESSION: No active cardiopulmonary disease. Electronically Signed   By: Lajean Manes M.D.   On: 09/23/2019 15:52   Ct Head Wo Contrast  Result Date: 09/23/2019 CLINICAL DATA:  Head trauma, minor, GCS greater than or equal to 13, high clinical risk, initial exam.  C-spine trauma, high clinical risk. Additional history provided: Fall EXAM: CT HEAD WITHOUT CONTRAST CT CERVICAL SPINE WITHOUT CONTRAST TECHNIQUE: Multidetector CT imaging of the head and cervical spine was performed following the standard protocol without intravenous contrast. Multiplanar CT image reconstructions of the cervical spine were also generated. COMPARISON:  Head CT 03/28/2018,  CT cervical spine 11/03/2010 FINDINGS: CT HEAD FINDINGS Brain: There is no evidence of acute intracranial hemorrhage. No acute cortical infarct is identified. No evidence of intracranial mass. No midline shift or extra-axial fluid collection. Again demonstrated are multiple chronic cortically based infarcts within the right frontal, right parietal and left parietal-occipital lobes. Mild patchy hypodensity within the cerebral white matter is nonspecific, but consistent with chronic small vessel ischemic disease. Mild generalized parenchymal atrophy. Vascular: No hyperdense vessel Skull: Normal. Negative for fracture or focal lesion. Sinuses/Orbits: Visualized orbits demonstrate no acute abnormality. Mild mucosal thickening within the inferior frontal sinuses and within bilateral ethmoid air cells. No significant mastoid effusion. CT CERVICAL SPINE FINDINGS Alignment: Straightening of the expected cervical lordosis. Trace C4-C5 anterolisthesis. Skull base and vertebrae: The basion-dental and atlanto-dental intervals are maintained.No evidence of acute fracture to the cervical spine. Soft tissues and spinal canal: No prevertebral fluid or swelling. No visible canal hematoma. Disc levels: Cervical spondylosis most notable at C5-C6 where there is moderate/severe disc height loss with a posterior disc osteophyte complex and left greater than right uncovertebral hypertrophy. Multilevel facet arthrosis. Upper chest: Imaged lung apices clear. Bullous change at the left lung apex. Other: Atherosclerotic calcifications within the carotid  arteries. IMPRESSION: CT head: 1. No evidence of acute intracranial abnormality. 2. Redemonstrated chronic  cortically based infarcts involving the right frontal, right parietal and left parietooccipital lobes. 3. Mild generalized parenchymal atrophy and chronic small vessel ischemic disease. CT cervical spine: 1. No evidence of acute fracture to the cervical spine. 2. Cervical spondylosis greatest at C5-C6. Electronically Signed   By: Kellie Simmering DO   On: 09/23/2019 16:05   Ct Cervical Spine Wo Contrast  Result Date: 09/23/2019 CLINICAL DATA:  Head trauma, minor, GCS greater than or equal to 13, high clinical risk, initial exam. C-spine trauma, high clinical risk. Additional history provided: Fall EXAM: CT HEAD WITHOUT CONTRAST CT CERVICAL SPINE WITHOUT CONTRAST TECHNIQUE: Multidetector CT imaging of the head and cervical spine was performed following the standard protocol without intravenous contrast. Multiplanar CT image reconstructions of the cervical spine were also generated. COMPARISON:  Head CT 03/28/2018,  CT cervical spine 11/03/2010 FINDINGS: CT HEAD FINDINGS Brain: There is no evidence of acute intracranial hemorrhage. No acute cortical infarct is identified. No evidence of intracranial mass. No midline shift or extra-axial fluid collection. Again demonstrated are multiple chronic cortically based infarcts within the right frontal, right parietal and left parietal-occipital lobes. Mild patchy hypodensity within the cerebral white matter is nonspecific, but consistent with chronic small vessel ischemic disease. Mild generalized parenchymal atrophy. Vascular: No hyperdense vessel Skull: Normal. Negative for fracture or focal lesion. Sinuses/Orbits: Visualized orbits demonstrate no acute abnormality. Mild mucosal thickening within the inferior frontal sinuses and within bilateral ethmoid air cells. No significant mastoid effusion. CT CERVICAL SPINE FINDINGS Alignment: Straightening of the expected  cervical lordosis. Trace C4-C5 anterolisthesis. Skull base and vertebrae: The basion-dental and atlanto-dental intervals are maintained.No evidence of acute fracture to the cervical spine. Soft tissues and spinal canal: No prevertebral fluid or swelling. No visible canal hematoma. Disc levels: Cervical spondylosis most notable at C5-C6 where there is moderate/severe disc height loss with a posterior disc osteophyte complex and left greater than right uncovertebral hypertrophy. Multilevel facet arthrosis. Upper chest: Imaged lung apices clear. Bullous change at the left lung apex. Other: Atherosclerotic calcifications within the carotid arteries. IMPRESSION: CT head: 1. No evidence of acute intracranial abnormality. 2. Redemonstrated chronic cortically based infarcts involving the right frontal, right parietal and left parietooccipital lobes. 3. Mild generalized parenchymal atrophy and chronic small vessel ischemic disease. CT cervical spine: 1. No evidence of acute fracture to the cervical spine. 2. Cervical spondylosis greatest at C5-C6. Electronically Signed   By: Kellie Simmering DO   On: 09/23/2019 16:05   Mr Brain Wo Contrast  Result Date: 09/24/2019 CLINICAL DATA:  Ataxia EXAM: MRI HEAD WITHOUT CONTRAST TECHNIQUE: Multiplanar, multiecho pulse sequences of the brain and surrounding structures were obtained without intravenous contrast. COMPARISON:  2012 FINDINGS: Brain: There is no acute infarction or hemorrhage. Multiple chronic infarcts are again identified including involvement of the right frontal, parietal, and temporal lobes and left occipital lobe. There is a chronic small vessel infarct of the right caudate. There is volume loss and chronic blood products associated with these infarcts. Additional patchy T2 hyperintensity in the supratentorial white matter likely reflects chronic microvascular ischemic changes. There is no intracranial mass, mass effect, edema, or extra-axial fluid collection.  Vascular: Major vessel flow voids at the skull base are preserved. Skull and upper cervical spine: Normal marrow signal is preserved. Sinuses/Orbits: Mild paranasal sinus mucosal thickening. Orbits are unremarkable. Other: None. IMPRESSION: No acute infarction, intracranial hemorrhage, or mass. Multiple chronic infarcts with progression since 2012. Electronically Signed   By: Macy Mis M.D.   On: 09/24/2019  09:38   Dg Hand 2 View Left  Result Date: 09/23/2019 CLINICAL DATA:  Pt states his son brought him to the ER today, states that he has been falling and that while sitting he sees black and passes out for few seconds, pt concerned that he could be having mini strokes, states that he has had a few of those in the past and a major stroke, pt has contracture to the left arm. EXAM: LEFT HAND - 2 VIEW COMPARISON:  None. FINDINGS: No fracture or bone lesion. Joints are normally spaced and aligned. Mild soft tissue swelling suggested dorsal to the third metacarpophalangeal joint. IMPRESSION: No fracture or dislocation. Electronically Signed   By: Lajean Manes M.D.   On: 09/23/2019 15:51   US Carotid Bilateral  Result Date: 09/24/2019 CLINICAL DATA:  CVA. History of hypertension, smoking and visual disturbance. EXAM: BILATERAL CAROTID DUPLEX ULTRASOUND TECHNIQUE: Pearline Cables scale imaging, color Doppler and duplex ultrasound were performed of bilateral carotid and vertebral arteries in the neck. COMPARISON:  None. FINDINGS: Criteria: Quantification of carotid stenosis is based on velocity parameters that correlate the residual internal carotid diameter with NASCET-based stenosis levels, using the diameter of the distal internal carotid lumen as the denominator for stenosis measurement. The following velocity measurements were obtained: RIGHT ICA: 69/28 cm/sec CCA: 0000000 cm/sec SYSTOLIC ICA/CCA RATIO:  1.2 ECA: 69 cm/sec LEFT ICA: 76/35 cm/sec CCA: 0000000 cm/sec SYSTOLIC ICA/CCA RATIO:  1.2 ECA: 87 cm/sec RIGHT  CAROTID ARTERY: There is a minimal amount of eccentric echogenic plaque involving the mid and distal aspect of the right common carotid artery (images 7 and 11). There is a minimal amount of eccentric echogenic plaque within the right carotid bulb (image 15), extending to involve the origin and proximal aspects of the right internal carotid artery (image 22), not resulting in elevated peak systolic velocities within the interrogated course of the right internal carotid artery to suggest a hemodynamically significant stenosis. RIGHT VERTEBRAL ARTERY:  Antegrade flow LEFT CAROTID ARTERY: There is a minimal amount of intimal thickening/hypoechoic plaque seen throughout the left common carotid artery. There is a minimal amount of intimal thickening/mixed echogenic plaque involving the left carotid bulb (image 46), not resulting in elevated peak systolic velocities within the interrogated course of the left internal carotid artery to suggest a hemodynamically significant stenosis. LEFT VERTEBRAL ARTERY:  Antegrade flow IMPRESSION: Minimal amount of bilateral atherosclerotic plaque right subjectively greater than left, not resulting in a hemodynamically significant stenosis within either internal carotid artery. Electronically Signed   By: Sandi Mariscal M.D.   On: 09/24/2019 10:51    Management plans discussed with the patient, and patient allowed me to contact his daughter over the phone today and they are in agreement.  CODE STATUS:     Code Status Orders  (From admission, onward)         Start     Ordered   09/23/19 1701  Full code  Continuous     09/23/19 1701        Code Status History    Date Active Date Inactive Code Status Order ID Comments User Context   12/22/2017 0132 12/25/2017 0058 Full Code [id number]  Ashok Pall, MD Inpatient   Advance Care Planning Activity      TOTAL TIME TAKING CARE OF THIS PATIENT: 35 minutes.    Loletha Grayer M.D on 09/25/2019 at 1:25 PM  Between 7am to  6pm - Pager - [phone]  After 6pm go to [email or web address] - [facility]  CC: Primary care physician; Tammi Sou, MD

## 2019-09-27 ENCOUNTER — Telehealth: Payer: Self-pay

## 2019-09-27 NOTE — Telephone Encounter (Signed)
LM requesting call back to complete TCM and schedule hospital follow up.   

## 2019-09-28 NOTE — Telephone Encounter (Signed)
Second attempt to reach patient to complete TCM and schedule hospital f/u.

## 2019-10-01 NOTE — Telephone Encounter (Signed)
LM requesting call back to complete TCM and schedule hospital follow up.   

## 2019-11-25 ENCOUNTER — Other Ambulatory Visit: Payer: Self-pay | Admitting: Family Medicine

## 2019-11-25 ENCOUNTER — Encounter: Payer: Self-pay | Admitting: Family Medicine

## 2019-11-25 NOTE — Telephone Encounter (Signed)
I am sending in 10 day supply of each med but pt needs IN PERSON office f/u prior to any further RFs.  NOTE: IF PT NO-SHOWS or IF DOESN'T F/U PRIOR TO RUNNING OUT OF THESE TWO MEDS THEN HE NEEDS TO BE DISMISSED FROM THE PRACTICE DUE TO REPEATED NO SHOWS AND MEDICAL NONCOMPLIANCE. -THX Signed:  Crissie Sickles, MD           11/25/2019

## 2019-11-25 NOTE — Telephone Encounter (Signed)
Pt was d/c from ED on 09/25/19 and contacted to schedule hospital f/u more than once with no call back. Received RF's for midodrine and seroquel prescribed during d/c.  RF request for Midodrine LOV:05/19/19, no show Next ov: n/a Last written:09/25/19(90,0)   RF request for Seroquel LOV:05/19/19, no show Next ov: n/a Last written:09/25/19(30,0)  Please advise. Medications pending

## 2019-11-26 ENCOUNTER — Other Ambulatory Visit: Payer: Self-pay

## 2019-11-26 MED ORDER — ATORVASTATIN CALCIUM 40 MG PO TABS: 40.0000 mg | ORAL_TABLET | Freq: Every day | ORAL | 0 refills | Status: DC

## 2019-11-26 NOTE — Telephone Encounter (Signed)
LM for pt to return call. In office appt needed

## 2019-11-26 NOTE — Telephone Encounter (Signed)
Pt has been scheduled for f/u appt on 12/03/19. He is aware to take 2 of the 200mg  tabs for Seroquel and no further refills will be given.

## 2019-11-29 ENCOUNTER — Other Ambulatory Visit: Payer: Self-pay | Admitting: Family Medicine

## 2019-11-29 NOTE — Telephone Encounter (Signed)
Pt was advised on 1/29 to take 2 tabs of the 200mg  dose until his appt on 2/5.

## 2019-12-01 ENCOUNTER — Other Ambulatory Visit: Payer: Self-pay

## 2019-12-03 ENCOUNTER — Telehealth: Payer: Self-pay

## 2019-12-03 ENCOUNTER — Ambulatory Visit: Payer: Medicare Other | Admitting: Family Medicine

## 2019-12-03 DIAGNOSIS — Z0289 Encounter for other administrative examinations: Secondary | ICD-10-CM

## 2019-12-03 MED ORDER — QUETIAPINE FUMARATE 400 MG PO TABS: ORAL_TABLET | ORAL | 0 refills | Status: DC

## 2019-12-03 NOTE — Progress Notes (Deleted)
OFFICE VISIT  12/03/2019   CC: No chief complaint on file.   HPI:    Patient is a 59 y.o. Caucasian male who presents for f/u HTN, insomnia, anx/dep, hx of CVA with residual L arm spastic hemiparesis and unstable gait. Hx of alcoholism/polysubstance abuse.  Interim hx:  Pt admitted 08/2019 for a couple days for frequent falls. Was found to have AKI and low mag and potassium.  He was having orthostatic dizziness so his lisinopril was stopped. UDS + marijuana but o/w neg.  ETOH neg. Imaging all showed no acute problems.  Progression of prior CVAs compared to 2012 was noted on pt's MRI brain at that time. He was sent home on ASA in addition to his prior meds (except his lisinopril).  However his seroquel was rx'd at lower dose of 200mg  qhs (was 400 mg qhs).  Interim hx: ***  Past Medical History:  Diagnosis Date  . Alcoholism ([place])    Continues to abuse ETOH as of 01/2017; pt has poor insight into his problem.  Pt states that as of 04/2017 he has been 3 monthds sober.  . Anxiety and depression   . Arthritis    "hands, legs" (12/23/2017)  . Chronic joint pain   . COPD (chronic obstructive pulmonary disease) ([place])   . Depression   . Frequent headaches    "depends on BP" (12/23/2017)  . History of kidney stones   . Hypertension   . Insomnia 08/26/2017   08/23/17 ED visit for adverse rxn to taking ambien, PLUS + marijuana on UDS her and in ED---NO FURTHER AMBIEN OR ANY OTHER CONTROLLED SUBSTANCES WILL BE PRESCRIBED BY ME--PM  . Pneumothorax, right   . Polysubstance abuse ([place])   . Poor historian    UNRELIABLE HISTORIAN  . 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  Baclofen oral no help.  Botox inj started by Dr. Posey Pronto 07/2018--no help x 1.  Inj #2 done 10/2018.  Marland Kitchen Stroke ([place]) 2012   LUE residual deficit: flexion contracture and weakness of L hand.  Old bilateral cerebral infarcts involving the right frontal and parietal lobes  as well as the left occipital lobe.  Baclofen and botox via neuro for L arm contracture.  . Stroke [facility])    "3 mini strokes since 2012" (12/23/2017)  . Subdural hematoma ([place]) 01/29/2017   Subacute right-sided frontal subdural hematoma: found on CT in [place] ED, transferred to [facility] where this was non-operatively managed.  Pt admitted to the MDs at [facility] that he still drinks lots of whisky daily and they surmised that his frequent falls of late are due to this and frequent use of OTC sleep aids.  Near complete resolution on CT 03/27/18.  . Venous insufficiency of both lower extremities     Past Surgical History:  Procedure Laterality Date  . CHEST TUBE INSERTION    . LUNG SURGERY    . SHOULDER OPEN ROTATOR CUFF REPAIR Bilateral    Multiple rotator cuff surgeries on both sides.  . TONSILLECTOMY    . TRANSTHORACIC ECHOCARDIOGRAM  09/24/2019   EF 55-60%, grd I DD.  Marland Kitchen TYMPANOPLASTY Right   . US CAROTIDS  09/24/2019   Minimal atherosclerotic plaque bilat; no flow obstruction  . VIDEO ASSISTED THORACOSCOPY (VATS) W/TALC PLEUADESIS  2010 X2    Outpatient Medications Prior to Visit  Medication Sig Dispense Refill  . aspirin EC 81 MG EC tablet Take 1 tablet (81 mg  total) by mouth daily. 30 tablet 0  . atorvastatin (LIPITOR) 40 MG tablet Take 1 tablet (40 mg total) by mouth daily at 6 PM. 10 tablet 0  . DULoxetine (CYMBALTA) 60 MG capsule TAKE 1 CAPSULE BY MOUTH EVERY DAY (Patient taking differently: Take 60 mg by mouth daily. ) 90 capsule 0  . midodrine (PROAMATINE) 2.5 MG tablet TAKE 1 TABLET BY MOUTH 3 TIMES DAILY WITH MEALS. 30 tablet 0  . nicotine (NICODERM CQ - DOSED IN MG/24 HOURS) 21 mg/24hr patch One 21mg  patch chest wall daily (may substitute generic) 28 patch 0  . QUEtiapine (SEROQUEL) 200 MG tablet TAKE 1 TABLET (200 MG TOTAL) BY MOUTH AT BEDTIME. 10 tablet 0  . thiamine (VITAMIN B-1) 100 MG tablet Take 1 tablet (100 mg total) by mouth daily. 30 tablet 0  . traZODone (DESYREL) 100 MG  tablet Take 200 mg by mouth at bedtime.     No facility-administered medications prior to visit.    Allergies  Allergen Reactions  . Ambien [Zolpidem] Other (See Comments)    Pt took too much, acted out a bad dream/was throwing things, etc--ED visit (07/2017)    ROS As per HPI  PE: There were no vitals taken for this visit. ***  LABS:  Lab Results  Component Value Date   TSH 1.58 11/21/2016   Lab Results  Component Value Date   WBC 9.6 09/24/2019   HGB 12.9 (L) 09/24/2019   HCT 38.6 (L) 09/24/2019   MCV 94.6 09/24/2019   PLT 238 09/24/2019   Lab Results  Component Value Date   CREATININE 0.99 09/25/2019   BUN 15 09/25/2019   NA 139 09/25/2019   K 4.0 09/25/2019   CL 110 09/25/2019   CO2 18 (L) 09/25/2019   Lab Results  Component Value Date   ALT 11 09/23/2019   AST 21 09/23/2019   ALKPHOS 92 09/23/2019   BILITOT 0.9 09/23/2019   Lab Results  Component Value Date   CHOL 178 09/24/2019   Lab Results  Component Value Date   HDL 49 09/24/2019   Lab Results  Component Value Date   LDLCALC 111 (H) 09/24/2019   Lab Results  Component Value Date   TRIG 88 09/24/2019   Lab Results  Component Value Date   CHOLHDL 3.6 09/24/2019    IMPRESSION AND PLAN:  No problem-specific Assessment & Plan notes found for this encounter.   An After Visit Summary was printed and given to the patient.  FOLLOW UP: No follow-ups on file.  Signed:  Crissie Sickles, MD           12/03/2019

## 2019-12-03 NOTE — Telephone Encounter (Signed)
I eRx'd his seroquel (quetiapine) that he uses for sleep-->7d supply at the 400mg  dose.

## 2019-12-03 NOTE — Telephone Encounter (Signed)
LMOM for patient to pick up RX and to keep his appointment for 2/15 for further refills.

## 2019-12-03 NOTE — Telephone Encounter (Signed)
Pt called this morning stating he could not make his appt today.  He made a wrong turn and ended up in [name].  He also requested a refill on Seroquel until he could come in for his R/S'd appt I made for him on 12/09/13.  I referenced the TE on 11/26/19 and let pt know that no further refills would be given until he is seen in the office.

## 2019-12-03 NOTE — Telephone Encounter (Signed)
Okay to send in short term supply for seroquel?  Patient scheduled 12/10/19.

## 2019-12-07 ENCOUNTER — Other Ambulatory Visit: Payer: Self-pay

## 2019-12-07 MED ORDER — DULOXETINE HCL 60 MG PO CPEP: ORAL_CAPSULE | ORAL | 0 refills | Status: DC

## 2019-12-10 ENCOUNTER — Ambulatory Visit (INDEPENDENT_AMBULATORY_CARE_PROVIDER_SITE_OTHER): Payer: Medicare Other | Admitting: Family Medicine

## 2019-12-10 ENCOUNTER — Other Ambulatory Visit: Payer: Self-pay

## 2019-12-10 ENCOUNTER — Encounter: Payer: Self-pay | Admitting: Family Medicine

## 2019-12-10 VITALS — BP 183/100 | HR 72 | Temp 99.1°F | Resp 16 | Ht 67.0 in | Wt 178.2 lb

## 2019-12-10 DIAGNOSIS — E7800 Pure hypercholesterolemia, unspecified: Secondary | ICD-10-CM

## 2019-12-10 DIAGNOSIS — I10 Essential (primary) hypertension: Secondary | ICD-10-CM | POA: Diagnosis not present

## 2019-12-10 DIAGNOSIS — F5101 Primary insomnia: Secondary | ICD-10-CM

## 2019-12-10 LAB — BASIC METABOLIC PANEL
BUN: 12 mg/dL (ref 6–23)
CO2: 25 mEq/L (ref 19–32)
Calcium: 9 mg/dL (ref 8.4–10.5)
Chloride: 105 mEq/L (ref 96–112)
Creatinine, Ser: 0.81 mg/dL (ref 0.40–1.50)
GFR: 96.97 mL/min (ref >60.00)
Glucose, Bld: 99 mg/dL (ref 70–99)
Potassium: 3.4 mEq/L — ABNORMAL LOW (ref 3.5–5.1)
Sodium: 141 mEq/L (ref 135–145)

## 2019-12-10 LAB — TSH: TSH: 1.03 u[IU]/mL (ref 0.35–4.50)

## 2019-12-10 LAB — LIPID PANEL
Cholesterol: 136 mg/dL (ref 0–200)
HDL: 57.8 mg/dL (ref >39.00)
LDL Cholesterol: 49 mg/dL (ref 0–99)
NonHDL: 78.46
Total CHOL/HDL Ratio: 2
Triglycerides: 148 mg/dL (ref 0.0–149.0)
VLDL: 29.6 mg/dL (ref 0.0–40.0)

## 2019-12-10 MED ORDER — ATORVASTATIN CALCIUM 40 MG PO TABS: 40.0000 mg | ORAL_TABLET | Freq: Every day | ORAL | 1 refills | Status: DC

## 2019-12-10 MED ORDER — QUETIAPINE FUMARATE 400 MG PO TABS: ORAL_TABLET | ORAL | 1 refills | Status: DC

## 2019-12-10 MED ORDER — DULOXETINE HCL 60 MG PO CPEP: ORAL_CAPSULE | ORAL | 1 refills | Status: DC

## 2019-12-10 MED ORDER — TRAZODONE HCL 100 MG PO TABS: 200.0000 mg | ORAL_TABLET | Freq: Every day | ORAL | 1 refills | Status: DC

## 2019-12-10 NOTE — Progress Notes (Signed)
OFFICE VISIT  12/10/2019   CC:  Chief Complaint  Patient presents with  . Follow-up    RCI, pt is fasting   HPI:    Patient is a 59 y.o. Caucasian male who presents for f/u severe insomnia, HTN, HLD.  I last saw pt in office 04/12/20. A/P as of last visit:  "1) HTN: The current medical regimen is effective;  continue present plan and medications (lisin 40mg  bid). Lytes/cr at f/u in 1 mo. 2) Insomnia, chronic and severe: I emphasized the need to start taking his seroquel at 400mg  bid dosing, hopefully to better control his mood disorder and thereby get him sleeping better.  Continue trazodone 200 mg qhs as well.  We've gone over sleep hygiene many times but he doesn't seem to put much stock in this--->focuses on meds constantly.  Interim hx: Pt missed f/u appt 7/20 and 7/22, 2021.  He was admitted to [facility] 11/26-11/28, 2020 for syncope, dehydration, AKI, multiple falls. Low pot and low mag replaced, lisinopril held and IVF given, CT head neg acute. Creatinine normalized.   Orthostatic hypotension improved although did not completely resolve.  Pt felt ready to go home, though, so he was d/c'd home on midodrine (hopefully short term) and his lisinopril was completely d/c'd.  Also instructed NOT to take ASA at d/c from hosp. His seroquel for severe insomnia was lowered to 200mg  qhs dose upon d/c.  Since d/c from hosp: Pt did continue to take ASA 81mg  qd. He has also restarted his lisinopril 40 mg qd. No home bp monitoring. Taking TWO of the 400 quetiapine hs instead of 1, despite my instructions to take max 400mg  at any one time. Drinking more water, less MT Dew/colas.  HLD: was tolerating statin but ran out about 1 wk ago.  ROS: no fevers, no CP, no SOB, no wheezing, no cough, no dizziness, no HAs, no rashes, no melena/hematochezia.  No polyuria or polydipsia.  No myalgias or arthralgias.   Past Medical History:  Diagnosis Date  . Alcoholism ([place])    Continues to abuse ETOH as  of 01/2017; pt has poor insight into his problem.  Pt states that as of 04/2017 he has been 3 monthds sober.  . Anxiety and depression   . Arthritis    "hands, legs" (12/23/2017)  . Chronic joint pain   . COPD (chronic obstructive pulmonary disease) ([place])   . Depression   . Frequent headaches    "depends on BP" (12/23/2017)  . History of kidney stones   . Hypertension   . Insomnia 08/26/2017   08/23/17 ED visit for adverse rxn to taking ambien, PLUS + marijuana on UDS her and in ED---NO FURTHER AMBIEN OR ANY OTHER CONTROLLED SUBSTANCES WILL BE PRESCRIBED BY ME--PM  . Pneumothorax, right   . Polysubstance abuse ([place])   . Poor historian    UNRELIABLE HISTORIAN  . 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  Baclofen oral no help.  Botox inj started by Dr. Posey Pronto 07/2018--no help x 1.  Inj #2 done 10/2018.  Marland Kitchen Stroke ([place]) 2012   LUE residual deficit: flexion contracture and weakness of L hand.  Old bilateral cerebral infarcts involving the right frontal and parietal lobes as well as the left occipital lobe.  Baclofen and botox via neuro for L arm contracture.  . Stroke [facility])    "3 mini strokes since 2012" (12/23/2017)  . Subdural hematoma ([place]) 01/29/2017  Subacute right-sided frontal subdural hematoma: found on CT in [place] ED, transferred to [facility] where this was non-operatively managed.  Pt admitted to the MDs at [facility] that he still drinks lots of whisky daily and they surmised that his frequent falls of late are due to this and frequent use of OTC sleep aids.  Near complete resolution on CT 03/27/18.  . Venous insufficiency of both lower extremities     Past Surgical History:  Procedure Laterality Date  . CHEST TUBE INSERTION    . LUNG SURGERY    . SHOULDER OPEN ROTATOR CUFF REPAIR Bilateral    Multiple rotator cuff surgeries on both sides.  . TONSILLECTOMY    . TRANSTHORACIC ECHOCARDIOGRAM  09/24/2019   EF 55-60%, grd I DD.  Marland Kitchen  TYMPANOPLASTY Right   . US CAROTIDS  09/24/2019   Minimal atherosclerotic plaque bilat; no flow obstruction  . VIDEO ASSISTED THORACOSCOPY (VATS) W/TALC PLEUADESIS  2010 X2    Outpatient Medications Prior to Visit  Medication Sig Dispense Refill  . aspirin EC 81 MG EC tablet Take 1 tablet (81 mg total) by mouth daily. 30 tablet 0  . thiamine (VITAMIN B-1) 100 MG tablet Take 1 tablet (100 mg total) by mouth daily. 30 tablet 0  . atorvastatin (LIPITOR) 40 MG tablet Take 1 tablet (40 mg total) by mouth daily at 6 PM. 10 tablet 0  . DULoxetine (CYMBALTA) 60 MG capsule TAKE 1 CAPSULE BY MOUTH EVERY DAY 7 capsule 0  . midodrine (PROAMATINE) 2.5 MG tablet TAKE 1 TABLET BY MOUTH 3 TIMES DAILY WITH MEALS. 30 tablet 0  . QUEtiapine (SEROQUEL) 400 MG tablet 1 tab po qhs 7 tablet 0  . traZODone (DESYREL) 100 MG tablet Take 200 mg by mouth at bedtime.    . nicotine (NICODERM CQ - DOSED IN MG/24 HOURS) 21 mg/24hr patch One 21mg  patch chest wall daily (may substitute generic) (Patient not taking: Reported on 12/10/2019) 28 patch 0   No facility-administered medications prior to visit.    Allergies  Allergen Reactions  . Ambien [Zolpidem] Other (See Comments)    Pt took too much, acted out a bad dream/was throwing things, etc--ED visit (07/2017)    ROS As per HPI  PE: Blood pressure (!) 183/100, pulse 72, temperature 99.1 F (37.3 C), temperature source Temporal, resp. rate 16, height 5\' 7"  (1.702 m), weight 178 lb 3.2 oz (80.8 kg), SpO2 98 %. Body mass index is 27.91 kg/m.  Orthostatic bp/hr:  Supine 134/82, 67   Sitting upright 140/92, 69   Standing: 146/94, 68  Gen: Alert, well appearing.  Patient is oriented to person, place, time, and situation. AFFECT: pleasant, lucid thought and speech. CV: RRR, no m/r/g.   LUNGS: CTA bilat, nonlabored resps, good aeration in all lung fields. EXT: no clubbing or cyanosis.  no edema.    LABS:    Chemistry      Component Value Date/Time   NA 139  09/25/2019 0548   NA 134 (A) 01/29/2017 0000   K 4.0 09/25/2019 0548   CL 110 09/25/2019 0548   CO2 18 (L) 09/25/2019 0548   BUN 15 09/25/2019 0548   BUN 28 (A) 01/29/2017 0000   CREATININE 0.99 09/25/2019 0548   CREATININE 1.01 04/03/2018 1528      Component Value Date/Time   CALCIUM 8.6 (L) 09/25/2019 0548   ALKPHOS 92 09/23/2019 1451   AST 21 09/23/2019 1451   ALT 11 09/23/2019 1451   BILITOT 0.9 09/23/2019 1451  Lab Results  Component Value Date   WBC 9.6 09/24/2019   HGB 12.9 (L) 09/24/2019   HCT 38.6 (L) 09/24/2019   MCV 94.6 09/24/2019   PLT 238 09/24/2019   Lab Results  Component Value Date   TSH 1.58 11/21/2016   Lab Results  Component Value Date   CHOL 178 09/24/2019   HDL 49 09/24/2019   LDLCALC 111 (H) 09/24/2019   TRIG 88 09/24/2019   CHOLHDL 3.6 09/24/2019   IMPRESSION AND PLAN:  1) Uncontrolled HTN: will d/c midodrine. Continue lisinopril 40mg  bid. If bp not signif improved off of midodrine then will add med. BMET and TSH today.  2) Severe chronic insomnia: I reiterated to pt today that he should not take more than a 400mg  dose of quetiapine at bedtime. Continue trazodone 200 mg qhs as well.  3) HLD: has been out of atorva x 1 wk. FLP today.  Hepatic panel good 08/2019.  An After Visit Summary was printed and given to the patient.  FOLLOW UP: Return for 2 week in person f/u uncontrolled HTN.  Signed:  Crissie Sickles, MD           12/10/2019

## 2019-12-13 ENCOUNTER — Other Ambulatory Visit: Payer: Self-pay

## 2019-12-13 MED ORDER — POTASSIUM CHLORIDE CRYS ER 20 MEQ PO TBCR: 20.0000 meq | EXTENDED_RELEASE_TABLET | Freq: Two times a day (BID) | ORAL | 5 refills | Status: DC

## 2019-12-13 NOTE — Progress Notes (Signed)
klor

## 2019-12-17 ENCOUNTER — Telehealth: Payer: Self-pay | Admitting: Family Medicine

## 2019-12-17 ENCOUNTER — Ambulatory Visit: Payer: Medicare Other | Admitting: Family Medicine

## 2019-12-17 ENCOUNTER — Other Ambulatory Visit: Payer: Self-pay

## 2019-12-17 MED ORDER — LISINOPRIL 40 MG PO TABS: 40.0000 mg | ORAL_TABLET | Freq: Two times a day (BID) | ORAL | 0 refills | Status: DC

## 2019-12-17 NOTE — Telephone Encounter (Signed)
Pt has been out of lisinopril for over a week. He said [facility] sent over the request for this medicine. He is requesting a call back. He said his service might be messed up so please leave him a message about what is going on if you don't reach him.

## 2019-12-17 NOTE — Telephone Encounter (Signed)
Pt's last visit on 2/12, f/u for BP advised to continue Lisinopril 40mg  bid. RF sent to pharmacy. Left detailed message. Okay per [facility]

## 2019-12-24 ENCOUNTER — Other Ambulatory Visit: Payer: Self-pay

## 2019-12-24 ENCOUNTER — Encounter: Payer: Self-pay | Admitting: Family Medicine

## 2019-12-24 ENCOUNTER — Ambulatory Visit (INDEPENDENT_AMBULATORY_CARE_PROVIDER_SITE_OTHER): Payer: Medicare Other | Admitting: Family Medicine

## 2019-12-24 VITALS — BP 97/64 | HR 73 | Temp 97.9°F | Resp 18 | Ht 67.0 in | Wt 178.0 lb

## 2019-12-24 DIAGNOSIS — I10 Essential (primary) hypertension: Secondary | ICD-10-CM

## 2019-12-24 DIAGNOSIS — I952 Hypotension due to drugs: Secondary | ICD-10-CM

## 2019-12-24 DIAGNOSIS — S60222A Contusion of left hand, initial encounter: Secondary | ICD-10-CM

## 2019-12-24 DIAGNOSIS — E876 Hypokalemia: Secondary | ICD-10-CM | POA: Diagnosis not present

## 2019-12-24 LAB — BASIC METABOLIC PANEL
BUN: 17 mg/dL (ref 6–23)
CO2: 29 mEq/L (ref 19–32)
Calcium: 9.5 mg/dL (ref 8.4–10.5)
Chloride: 103 mEq/L (ref 96–112)
Creatinine, Ser: 1.24 mg/dL (ref 0.40–1.50)
GFR: 59.32 mL/min — ABNORMAL LOW (ref >60.00)
Glucose, Bld: 100 mg/dL — ABNORMAL HIGH (ref 70–99)
Potassium: 4.8 mEq/L (ref 3.5–5.1)
Sodium: 138 mEq/L (ref 135–145)

## 2019-12-24 NOTE — Progress Notes (Signed)
OFFICE VISIT  12/27/2019   CC: f/u HTN  HPI:    Patient is a 59 y.o. Caucasian male who presents for 2 wk f/u uncontrolled HTN. A/P as of last visit: "1) Uncontrolled HTN: will d/c midodrine. Continue lisinopril 40mg  bid. If bp not signif improved off of midodrine then will add med. BMET and TSH today.  2) Severe chronic insomnia: I reiterated to pt today that he should not take more than a 400mg  dose of quetiapine at bedtime. Continue trazodone 200 mg qhs as well.  3) HLD: has been out of atorva x 1 wk. FLP today.  Hepatic panel good 08/2019."  Interim hx: Labs last visit all good except potassium borderline low.  I recommended starting 40 mEQ KDUR qd.  Feeling well except L hand pain since hitting it on his wall accidentally sometime in the last week. He is unable to really tell me exactly what happened.  Some swelling, slight abrasion, slight bruising, pain about the same since it happened.  Has chronic flexion contracture of hand since his CVA and he has signif discomfort in L wrist and arm and hand on a chronic basis.    No home bp monitoring being done, although he has a bp machine. Taking med as rx'd.  Says he has only occasional orthsostatic lightheadedness that is mild and abates in a couple of seconds.  No presyncope or syncope.   Past Medical History:  Diagnosis Date  . Alcoholism ([place])    Continues to abuse ETOH as of 01/2017; pt has poor insight into his problem.  Pt states that as of 04/2017 he has been 3 monthds sober.  . Anxiety and depression   . Arthritis    "hands, legs" (12/23/2017)  . Chronic joint pain   . COPD (chronic obstructive pulmonary disease) ([place])   . Depression   . Frequent headaches    "depends on BP" (12/23/2017)  . History of kidney stones   . Hypertension   . Insomnia 08/26/2017   08/23/17 ED visit for adverse rxn to taking ambien, PLUS + marijuana on UDS her and in ED---NO FURTHER AMBIEN OR ANY OTHER CONTROLLED SUBSTANCES WILL BE  PRESCRIBED BY ME--PM  . Pneumothorax, right   . Polysubstance abuse ([place])   . Poor historian    UNRELIABLE HISTORIAN  . 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  Baclofen oral no help.  Botox inj started by Dr. Posey Pronto 07/2018--no help x 1.  Inj #2 done 10/2018.  Marland Kitchen Stroke ([place]) 2012   LUE residual deficit: flexion contracture and weakness of L hand.  Old bilateral cerebral infarcts involving the right frontal and parietal lobes as well as the left occipital lobe.  Baclofen and botox via neuro for L arm contracture.  . Stroke [facility])    "3 mini strokes since 2012" (12/23/2017)  . Subdural hematoma ([place]) 01/29/2017   Subacute right-sided frontal subdural hematoma: found on CT in [place] ED, transferred to [facility] where this was non-operatively managed.  Pt admitted to the MDs at [facility] that he still drinks lots of whisky daily and they surmised that his frequent falls of late are due to this and frequent use of OTC sleep aids.  Near complete resolution on CT 03/27/18.  . Venous insufficiency of both lower extremities     Past Surgical History:  Procedure Laterality Date  . CHEST TUBE INSERTION    . LUNG SURGERY    .  SHOULDER OPEN ROTATOR CUFF REPAIR Bilateral    Multiple rotator cuff surgeries on both sides.  . TONSILLECTOMY    . TRANSTHORACIC ECHOCARDIOGRAM  09/24/2019   EF 55-60%, grd I DD.  Marland Kitchen TYMPANOPLASTY Right   . US CAROTIDS  09/24/2019   Minimal atherosclerotic plaque bilat; no flow obstruction  . VIDEO ASSISTED THORACOSCOPY (VATS) W/TALC PLEUADESIS  2010 X2    Outpatient Medications Prior to Visit  Medication Sig Dispense Refill  . aspirin EC 81 MG EC tablet Take 1 tablet (81 mg total) by mouth daily. 30 tablet 0  . atorvastatin (LIPITOR) 40 MG tablet Take 1 tablet (40 mg total) by mouth daily at 6 PM. 90 tablet 1  . DULoxetine (CYMBALTA) 60 MG capsule TAKE 1 CAPSULE BY MOUTH EVERY DAY 90 capsule 1  . lisinopril (ZESTRIL) 40 MG  tablet Take 1 tablet (40 mg total) by mouth in the morning and at bedtime. 60 tablet 0  . nicotine (NICODERM CQ - DOSED IN MG/24 HOURS) 21 mg/24hr patch One 21mg  patch chest wall daily (may substitute generic) 28 patch 0  . potassium chloride SA (KLOR-CON) 20 MEQ tablet Take 1 tablet (20 mEq total) by mouth 2 (two) times daily. 60 tablet 5  . QUEtiapine (SEROQUEL) 400 MG tablet 1 tab po qhs 90 tablet 1  . thiamine (VITAMIN B-1) 100 MG tablet Take 1 tablet (100 mg total) by mouth daily. 30 tablet 0  . traZODone (DESYREL) 100 MG tablet Take 2 tablets (200 mg total) by mouth at bedtime. 180 tablet 1   No facility-administered medications prior to visit.    Allergies  Allergen Reactions  . Ambien [Zolpidem] Other (See Comments)    Pt took too much, acted out a bad dream/was throwing things, etc--ED visit (07/2017)    ROS As per HPI  PE: Blood pressure 97/64, pulse 73, temperature 97.9 F (36.6 C), temperature source Temporal, resp. rate 18, height 5\' 7"  (1.702 m), weight 178 lb (80.7 kg), SpO2 95 %. Gen: Alert, well appearing.  Patient is oriented to person, place, time, and situation. AFFECT: pleasant, lucid thought and speech. CV: RRR, no m/r/g.   LUNGS: CTA bilat, nonlabored resps, good aeration in all lung fields. L hand: flexion contractures of fingers and wrist.  Mild soft tissue swelling around 4th and 5th MCP joints, small dry abrasions present.  TTP over swollen areas, no bruising.  He can extend fingers minimally--baseline.  LABS:  Lab Results  Component Value Date   TSH 1.03 12/10/2019   Lab Results  Component Value Date   WBC 9.6 09/24/2019   HGB 12.9 (L) 09/24/2019   HCT Robert.6 (L) 09/24/2019   MCV 94.6 09/24/2019   PLT 238 09/24/2019   Lab Results  Component Value Date   CREATININE 1.24 12/24/2019   BUN 17 12/24/2019   NA 138 12/24/2019   K 4.8 12/24/2019   CL 103 12/24/2019   CO2 29 12/24/2019   Lab Results  Component Value Date   ALT 11 09/23/2019   AST  21 09/23/2019   ALKPHOS 92 09/23/2019   BILITOT 0.9 09/23/2019   Lab Results  Component Value Date   CHOL 136 12/10/2019   Lab Results  Component Value Date   HDL 57.80 12/10/2019   Lab Results  Component Value Date   LDLCALC 49 12/10/2019   Lab Results  Component Value Date   TRIG 148.0 12/10/2019   Lab Results  Component Value Date   CHOLHDL 2 12/10/2019    IMPRESSION  AND PLAN:  No problem-specific Assessment & Plan notes found for this encounter.  Hold lisinopril.  Instructions: BMET and hand x-ray today.  Do not take anymore blood pressure medication today (lisinopril). Check blood pressure daily.  If blood pressure above 140/90 then start lisinopril again but take just 1/2 tab twice a day for the first day.  Increase to a whole tab twice per day if blood pressure remains >140/90 the next day.  An After Visit Summary was printed and given to the patient.  FOLLOW UP: Return in about 1 week (around 12/31/2019) for f/u bp.  Signed:  Crissie Sickles, MD           12/27/2019

## 2019-12-24 NOTE — Patient Instructions (Signed)
Do not take anymore blood pressure medication today (lisinopril). Check blood pressure daily.  If blood pressure above 140/90 then start lisinopril again but take just 1/2 tab twice a day for the first day.  Increase to a whole tab twice per day if blood pressure remains >140/90 the next day.

## 2019-12-31 ENCOUNTER — Ambulatory Visit: Payer: Medicare Other | Admitting: Family Medicine

## 2019-12-31 NOTE — Progress Notes (Deleted)
OFFICE VISIT  12/31/2019   CC: No chief complaint on file.  HPI:    Patient is a 59 y.o. Caucasian male who presents for 1 wk f/u low bp in this pt with hx of labile bp and question of compliance. A/P as of last visit: "Low bp, asymptomatic-->Hold lisinopril.  Instructions: BMET and hand x-ray today.  Do not take anymore blood pressure medication today (lisinopril). Check blood pressure daily.  If blood pressure above 140/90 then start lisinopril again but take just 1/2 tab twice a day for the first day.  Increase to a whole tab twice per day if blood pressure remains >140/90 the next day.  Interim hx: sCr up to 1.24 (GFR @60  ml/min) the day he was here last when we had already decided to stop his ACE-I (at least temporarily, until his bp came back up).   We attempted to contact him several times to discuss these results but never got a return call.  ***  Past Medical History:  Diagnosis Date  . Alcoholism ([place])    Continues to abuse ETOH as of 01/2017; pt has poor insight into his problem.  Pt states that as of 04/2017 he has been 3 monthds sober.  . Anxiety and depression   . Arthritis    "hands, legs" (12/23/2017)  . Chronic joint pain   . COPD (chronic obstructive pulmonary disease) ([place])   . Depression   . Frequent headaches    "depends on BP" (12/23/2017)  . History of kidney stones   . Hypertension   . Insomnia 08/26/2017   08/23/17 ED visit for adverse rxn to taking ambien, PLUS + marijuana on UDS her and in ED---NO FURTHER AMBIEN OR ANY OTHER CONTROLLED SUBSTANCES WILL BE PRESCRIBED BY ME--PM  . Pneumothorax, right   . Polysubstance abuse ([place])   . Poor historian    UNRELIABLE HISTORIAN  . 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  Baclofen oral no help.  Botox inj started by Dr. Posey Pronto 07/2018--no help x 1.  Inj #2 done 10/2018.  Marland Kitchen Stroke ([place]) 2012   LUE residual deficit: flexion contracture and weakness of L hand.   Old bilateral cerebral infarcts involving the right frontal and parietal lobes as well as the left occipital lobe.  Baclofen and botox via neuro for L arm contracture.  . Stroke [facility])    "3 mini strokes since 2012" (12/23/2017)  . Subdural hematoma ([place]) 01/29/2017   Subacute right-sided frontal subdural hematoma: found on CT in [place] ED, transferred to [facility] where this was non-operatively managed.  Pt admitted to the MDs at [facility] that he still drinks lots of whisky daily and they surmised that his frequent falls of late are due to this and frequent use of OTC sleep aids.  Near complete resolution on CT 03/27/18.  . Venous insufficiency of both lower extremities     Past Surgical History:  Procedure Laterality Date  . CHEST TUBE INSERTION    . LUNG SURGERY    . SHOULDER OPEN ROTATOR CUFF REPAIR Bilateral    Multiple rotator cuff surgeries on both sides.  . TONSILLECTOMY    . TRANSTHORACIC ECHOCARDIOGRAM  09/24/2019   EF 55-60%, grd I DD.  Marland Kitchen TYMPANOPLASTY Right   . US CAROTIDS  09/24/2019   Minimal atherosclerotic plaque bilat; no flow obstruction  . VIDEO ASSISTED THORACOSCOPY (VATS) W/TALC PLEUADESIS  2010 X2    Outpatient Medications Prior to Visit  Medication Sig Dispense Refill  . aspirin EC 81 MG EC tablet Take 1 tablet (81 mg total) by mouth daily. 30 tablet 0  . atorvastatin (LIPITOR) 40 MG tablet Take 1 tablet (40 mg total) by mouth daily at 6 PM. 90 tablet 1  . DULoxetine (CYMBALTA) 60 MG capsule TAKE 1 CAPSULE BY MOUTH EVERY DAY 90 capsule 1  . lisinopril (ZESTRIL) 40 MG tablet Take 1 tablet (40 mg total) by mouth in the morning and at bedtime. 60 tablet 0  . nicotine (NICODERM CQ - DOSED IN MG/24 HOURS) 21 mg/24hr patch One 21mg  patch chest wall daily (may substitute generic) 28 patch 0  . potassium chloride SA (KLOR-CON) 20 MEQ tablet Take 1 tablet (20 mEq total) by mouth 2 (two) times daily. 60 tablet 5  . QUEtiapine (SEROQUEL) 400 MG tablet 1 tab po qhs 90 tablet 1  .  thiamine (VITAMIN B-1) 100 MG tablet Take 1 tablet (100 mg total) by mouth daily. 30 tablet 0  . traZODone (DESYREL) 100 MG tablet Take 2 tablets (200 mg total) by mouth at bedtime. 180 tablet 1   No facility-administered medications prior to visit.    Allergies  Allergen Reactions  . Ambien [Zolpidem] Other (See Comments)    Pt took too much, acted out a bad dream/was throwing things, etc--ED visit (07/2017)    ROS As per HPI  PE: There were no vitals taken for this visit. ***  LABS:  Lab Results  Component Value Date   TSH 1.03 12/10/2019   Lab Results  Component Value Date   WBC 9.6 09/24/2019   HGB 12.9 (L) 09/24/2019   HCT 38.6 (L) 09/24/2019   MCV 94.6 09/24/2019   PLT 238 09/24/2019  No results found for: IRON, TIBC, FERRITIN  Lab Results  Component Value Date   CREATININE 1.24 12/24/2019   BUN 17 12/24/2019   NA 138 12/24/2019   K 4.8 12/24/2019   CL 103 12/24/2019   CO2 29 12/24/2019   Lab Results  Component Value Date   ALT 11 09/23/2019   AST 21 09/23/2019   ALKPHOS 92 09/23/2019   BILITOT 0.9 09/23/2019   Lab Results  Component Value Date   CHOL 136 12/10/2019   Lab Results  Component Value Date   HDL 57.80 12/10/2019   Lab Results  Component Value Date   LDLCALC 49 12/10/2019   Lab Results  Component Value Date   TRIG 148.0 12/10/2019   Lab Results  Component Value Date   CHOLHDL 2 12/10/2019    IMPRESSION AND PLAN:  No problem-specific Assessment & Plan notes found for this encounter.   An After Visit Summary was printed and given to the patient.  FOLLOW UP: No follow-ups on file.  Signed:  Crissie Sickles, MD           12/31/2019

## 2020-01-05 ENCOUNTER — Ambulatory Visit (INDEPENDENT_AMBULATORY_CARE_PROVIDER_SITE_OTHER): Payer: Medicare Other | Admitting: Family Medicine

## 2020-01-05 ENCOUNTER — Other Ambulatory Visit: Payer: Self-pay

## 2020-01-05 ENCOUNTER — Encounter: Payer: Self-pay | Admitting: Family Medicine

## 2020-01-05 VITALS — BP 117/74 | HR 61 | Temp 98.4°F | Resp 16 | Ht 67.0 in | Wt 177.6 lb

## 2020-01-05 DIAGNOSIS — S60222A Contusion of left hand, initial encounter: Secondary | ICD-10-CM | POA: Diagnosis not present

## 2020-01-05 DIAGNOSIS — R7989 Other specified abnormal findings of blood chemistry: Secondary | ICD-10-CM | POA: Diagnosis not present

## 2020-01-05 DIAGNOSIS — I10 Essential (primary) hypertension: Secondary | ICD-10-CM | POA: Diagnosis not present

## 2020-01-05 MED ORDER — QUETIAPINE FUMARATE 200 MG PO TABS: ORAL_TABLET | ORAL | 0 refills | Status: DC

## 2020-01-05 NOTE — Progress Notes (Signed)
OFFICE VISIT  01/05/2020   CC:  Chief Complaint  Patient presents with  . Follow-up    hypertension, pt is fasting     HPI:    Patient is a 58 y.o. Caucasian male who presents for 12 day f/u HTN. A/P as of last visit: " Hold lisinopril.  Instructions: BMET and hand x-ray today.  Do not take anymore blood pressure medication today (lisinopril). Check blood pressure daily.  If blood pressure above 140/90 then start lisinopril again but take just 1/2 tab twice a day for the first day.  Increase to a whole tab twice per day if blood pressure remains >140/90 the next day".  He failed to f/u in 1 wk as instructed. His sCr dropped from 0.99 3 mo ago to 1.24 last check 12 d/a.  We were unable to reach the patient to give lab result info and recommendations. He restarted his lisinopril soon after last o/v, 40mg  bid as per his previous. He says he feels fine except for being depressed. Review of wrist bp cuff memory shows significant lability in bp readings, anywhere from 425-956 systolic and 38-756 diastolic.  One reading of 80/40 and he denies feeling any abnormality at that time. HR avg 70s.    Describes worsening depression/anxiety, starting about 1 yr ago when pandemic restrictions started. Sad, hopeless, anhedonia, chronic poor sleep, some inc anxiety and panic. No SI or HI.  Appetite fine.  No drugs or alcohol.  Past Medical History:  Diagnosis Date  . Alcoholism ([place])    Continues to abuse ETOH as of 01/2017; pt has poor insight into his problem.  Pt states that as of 04/2017 he has been 3 monthds sober.  . Anxiety and depression   . Arthritis    "hands, legs" (12/23/2017)  . Chronic joint pain   . COPD (chronic obstructive pulmonary disease) ([place])   . Depression   . Frequent headaches    "depends on BP" (12/23/2017)  . History of kidney stones   . Hypertension   . Insomnia 08/26/2017   08/23/17 ED visit for adverse rxn to taking ambien, PLUS + marijuana on UDS her and in  ED---NO FURTHER AMBIEN OR ANY OTHER CONTROLLED SUBSTANCES WILL BE PRESCRIBED BY ME--PM  . Pneumothorax, right   . Polysubstance abuse ([place])   . Poor historian    UNRELIABLE HISTORIAN  . 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  Baclofen oral no help.  Botox inj started by Dr. Posey Pronto 07/2018--no help x 1.  Inj #2 done 10/2018.  Marland Kitchen Stroke ([place]) 2012   LUE residual deficit: flexion contracture and weakness of L hand.  Old bilateral cerebral infarcts involving the right frontal and parietal lobes as well as the left occipital lobe.  Baclofen and botox via neuro for L arm contracture.  . Stroke [facility])    "3 mini strokes since 2012" (12/23/2017)  . Subdural hematoma ([place]) 01/29/2017   Subacute right-sided frontal subdural hematoma: found on CT in [place] ED, transferred to [facility] where this was non-operatively managed.  Pt admitted to the MDs at [facility] that he still drinks lots of whisky daily and they surmised that his frequent falls of late are due to this and frequent use of OTC sleep aids.  Near complete resolution on CT 03/27/18.  . Venous insufficiency of both lower extremities     Past Surgical History:  Procedure Laterality Date  . CHEST TUBE INSERTION    .  LUNG SURGERY    . SHOULDER OPEN ROTATOR CUFF REPAIR Bilateral    Multiple rotator cuff surgeries on both sides.  . TONSILLECTOMY    . TRANSTHORACIC ECHOCARDIOGRAM  09/24/2019   EF 55-60%, grd I DD.  Marland Kitchen TYMPANOPLASTY Right   . US CAROTIDS  09/24/2019   Minimal atherosclerotic plaque bilat; no flow obstruction  . VIDEO ASSISTED THORACOSCOPY (VATS) W/TALC PLEUADESIS  2010 X2    Outpatient Medications Prior to Visit  Medication Sig Dispense Refill  . aspirin EC 81 MG EC tablet Take 1 tablet (81 mg total) by mouth daily. 30 tablet 0  . atorvastatin (LIPITOR) 40 MG tablet Take 1 tablet (40 mg total) by mouth daily at 6 PM. 90 tablet 1  . DULoxetine (CYMBALTA) 60 MG capsule TAKE 1 CAPSULE BY  MOUTH EVERY DAY 90 capsule 1  . lisinopril (ZESTRIL) 40 MG tablet Take 1 tablet (40 mg total) by mouth in the morning and at bedtime. 60 tablet 0  . potassium chloride SA (KLOR-CON) 20 MEQ tablet Take 1 tablet (20 mEq total) by mouth 2 (two) times daily. 60 tablet 5  . QUEtiapine (SEROQUEL) 400 MG tablet 1 tab po qhs 90 tablet 1  . thiamine (VITAMIN B-1) 100 MG tablet Take 1 tablet (100 mg total) by mouth daily. 30 tablet 0  . traZODone (DESYREL) 100 MG tablet Take 2 tablets (200 mg total) by mouth at bedtime. 180 tablet 1  . nicotine (NICODERM CQ - DOSED IN MG/24 HOURS) 21 mg/24hr patch One 21mg  patch chest wall daily (may substitute generic) (Patient not taking: Reported on 01/05/2020) 28 patch 0   No facility-administered medications prior to visit.    Allergies  Allergen Reactions  . Ambien [Zolpidem] Other (See Comments)    Pt took too much, acted out a bad dream/was throwing things, etc--ED visit (07/2017)    ROS As per HPI  PE: Blood pressure 117/74, pulse 61, temperature 98.4 F (36.9 C), temperature source Temporal, resp. rate 16, height 5\' 7"  (1.702 m), weight 177 lb 9.6 oz (80.6 kg), SpO2 96 %. Gen: Alert, well appearing.  Patient is oriented to person, place, time, and situation. AFFECT: pleasant, lucid thought and speech. No further exam today.  LABS:    Chemistry      Component Value Date/Time   NA 138 12/24/2019 1140   NA 134 (A) 01/29/2017 0000   K 4.8 12/24/2019 1140   CL 103 12/24/2019 1140   CO2 29 12/24/2019 1140   BUN 17 12/24/2019 1140   BUN 28 (A) 01/29/2017 0000   CREATININE 1.24 12/24/2019 1140   CREATININE 1.01 04/03/2018 1528      Component Value Date/Time   CALCIUM 9.5 12/24/2019 1140   ALKPHOS 92 09/23/2019 1451   AST 21 09/23/2019 1451   ALT 11 09/23/2019 1451   BILITOT 0.9 09/23/2019 1451       IMPRESSION AND PLAN:  1) HTN: labile/erratic readings, poor control.  I wonder if his one very low reading was an error. At any rate, with  recent sCr elevation I'll keep him on current med but if sCr same or rising then I'll get him off his ACE-I and change to diff bp med regimen.  May ultimately need cardiologist referral for help with this b/c we've tried many meds w/out much success. Repeat BMET today.  2) MDD, recurrent.  Remote hx of substance abuse.  Moderate episode, no psychosis. Continue cymbalta and continue seroquel 400 mg qhs. Add seroquel daytime dose: 200 mg  qd x 7d, then increase to 400 mg qd.  I made sure that his phone number is updated with [place] and told him that if he has not heard from [place] with results of labs in 2d then he should call our office. He expressed understanding and agreement with plan.  An After Visit Summary was printed and given to the patient.  FOLLOW UP: Return in about 2 weeks (around 01/19/2020) for f/u bp/mood/renal function.  Signed:  Crissie Sickles, MD           01/05/2020

## 2020-01-06 LAB — BASIC METABOLIC PANEL
BUN: 13 mg/dL (ref 6–23)
CO2: 26 mEq/L (ref 19–32)
Calcium: 9.1 mg/dL (ref 8.4–10.5)
Chloride: 105 mEq/L (ref 96–112)
Creatinine, Ser: 0.93 mg/dL (ref 0.40–1.50)
GFR: 82.66 mL/min (ref >60.00)
Glucose, Bld: 88 mg/dL (ref 70–99)
Potassium: 4.8 mEq/L (ref 3.5–5.1)
Sodium: 137 mEq/L (ref 135–145)

## 2020-01-07 ENCOUNTER — Other Ambulatory Visit: Payer: Self-pay | Admitting: Family Medicine

## 2020-01-07 MED ORDER — FELODIPINE ER 10 MG PO TB24: 10.0000 mg | ORAL_TABLET | Freq: Every day | ORAL | 0 refills | Status: DC

## 2020-01-14 ENCOUNTER — Other Ambulatory Visit: Payer: Self-pay | Admitting: Family Medicine

## 2020-01-19 ENCOUNTER — Ambulatory Visit: Payer: Medicare Other | Admitting: Family Medicine

## 2020-01-20 ENCOUNTER — Other Ambulatory Visit: Payer: Self-pay | Admitting: Family Medicine

## 2020-01-21 ENCOUNTER — Ambulatory Visit (INDEPENDENT_AMBULATORY_CARE_PROVIDER_SITE_OTHER): Payer: Medicare Other | Admitting: Family Medicine

## 2020-01-21 ENCOUNTER — Other Ambulatory Visit: Payer: Self-pay

## 2020-01-21 VITALS — BP 151/92

## 2020-01-21 DIAGNOSIS — I10 Essential (primary) hypertension: Secondary | ICD-10-CM

## 2020-01-21 DIAGNOSIS — F331 Major depressive disorder, recurrent, moderate: Secondary | ICD-10-CM | POA: Diagnosis not present

## 2020-01-21 MED ORDER — LISINOPRIL 40 MG PO TABS: 40.0000 mg | ORAL_TABLET | Freq: Two times a day (BID) | ORAL | 6 refills | Status: DC

## 2020-01-21 MED ORDER — QUETIAPINE FUMARATE 400 MG PO TABS: ORAL_TABLET | ORAL | 0 refills | Status: DC

## 2020-01-21 NOTE — Progress Notes (Signed)
Virtual Visit via Video Note  I connected with pt on 01/21/20 at  2:30 PM EDT by telephone (a video enabled telemedicine application would not work for the patient--a problem that is not uncommon for him) and verified that I am speaking with the correct person using two identifiers.  Location patient: home Location provider:work or home office Persons participating in the virtual visit: patient, provider  I discussed the limitations of evaluation and management by telemedicine and the availability of in person appointments. The patient expressed understanding and agreed to proceed.  Telemedicine visit is a necessity given the COVID-19 restrictions in place at the current time.  HPI: 59 y/o WM with whom I am doing a telephone visit today (due to COVID-19 pandemic restrictions) for 2 wk f/u "bp/mood/renal function". A/P as of last visit: "1) HTN: labile/erratic readings, poor control.  I wonder if his one very low reading was an error. At any rate, with recent sCr elevation I'll keep him on current med but if sCr same or rising then I'll get him off his ACE-I and change to diff bp med regimen.  May ultimately need cardiologist referral for help with this b/c we've tried many meds w/out much success. Repeat BMET today.  2) MDD, recurrent.  Remote hx of substance abuse.  Moderate episode, no psychosis. Continue cymbalta and continue seroquel 400 mg qhs. Add seroquel daytime dose: 200 mg qd x 7d, then increase to 400 mg qd.  I made sure that his phone number is updated with [place] and told him that if he has not heard from [place] with results of labs in 2d then he should call our office. He expressed understanding and agreement with plan."  Result note from 01/05/20 labs:  "Labs all normal. Continue current meds at current doses. Also, to help with better blood pressure control I want him to start felodipine 10 mg, 1 tab po qd, #30, no RF--pls do rx. Continue to check blood pressure and heart rate  every day and we'll review the numbers at his f/u set for 2 wks from now."  Interim hx: Says he did not pick up the felodipine I rx'd 01/07/20.  Claims ignorance. BPs 150-160s/90s.  Highest syst 178, highest diast 111 (today, off med).  Ran out of lisinopril yesterday, son is picking up Rx today.  Says the addition of 200mg  seroquel dose in morning "hasn't done anything". No side effects from this med.  He continues to take his usual 400 mg dose of seroquel in evenings.   ROS: See pertinent positives and negatives per HPI.  Past Medical History:  Diagnosis Date  . Alcoholism ([place])    Continues to abuse ETOH as of 01/2017; pt has poor insight into his problem.  Pt states that as of 04/2017 he has been 3 monthds sober.  . Anxiety and depression   . Arthritis    "hands, legs" (12/23/2017)  . Chronic joint pain   . COPD (chronic obstructive pulmonary disease) ([place])   . Depression   . Frequent headaches    "depends on BP" (12/23/2017)  . History of kidney stones   . Hypertension   . Insomnia 08/26/2017   08/23/17 ED visit for adverse rxn to taking ambien, PLUS + marijuana on UDS her and in ED---NO FURTHER AMBIEN OR ANY OTHER CONTROLLED SUBSTANCES WILL BE PRESCRIBED BY ME--PM  . Pneumothorax, right   . Polysubstance abuse ([place])   . Poor historian    UNRELIABLE HISTORIAN  . Spastic hemiparesis ([place])  left arm (signif 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  Baclofen oral no help.  Botox inj started by Dr. Posey Pronto 07/2018--no help x 1.  Inj #2 done 10/2018.  Marland Kitchen Stroke ([place]) 2012   LUE residual deficit: flexion contracture and weakness of L hand.  Old bilateral cerebral infarcts involving the right frontal and parietal lobes as well as the left occipital lobe.  Baclofen and botox via neuro for L arm contracture.  . Stroke [facility])    "3 mini strokes since 2012" (12/23/2017)  . Subdural hematoma ([place]) 01/29/2017   Subacute right-sided frontal subdural hematoma: found on CT in [place] ED,  transferred to [facility] where this was non-operatively managed.  Pt admitted to the MDs at [facility] that he still drinks lots of whisky daily and they surmised that his frequent falls of late are due to this and frequent use of OTC sleep aids.  Near complete resolution on CT 03/27/18.  . Venous insufficiency of both lower extremities     Past Surgical History:  Procedure Laterality Date  . CHEST TUBE INSERTION    . LUNG SURGERY    . SHOULDER OPEN ROTATOR CUFF REPAIR Bilateral    Multiple rotator cuff surgeries on both sides.  . TONSILLECTOMY    . TRANSTHORACIC ECHOCARDIOGRAM  09/24/2019   EF 55-60%, grd I DD.  Marland Kitchen TYMPANOPLASTY Right   . US CAROTIDS  09/24/2019   Minimal atherosclerotic plaque bilat; no flow obstruction  . VIDEO ASSISTED THORACOSCOPY (VATS) W/TALC PLEUADESIS  2010 X2    Family History  Problem Relation Age of Onset  . Arthritis Mother   . Heart disease Father   . Hypertension Father   . Breast cancer Maternal Aunt   . Alcohol abuse Maternal Uncle   . Lung cancer Maternal Aunt   . Cancer Brother        bone     Current Outpatient Medications:  .  aspirin EC 81 MG EC tablet, Take 1 tablet (81 mg total) by mouth daily., Disp: 30 tablet, Rfl: 0 .  atorvastatin (LIPITOR) 40 MG tablet, Take 1 tablet (40 mg total) by mouth daily at 6 PM., Disp: 90 tablet, Rfl: 1 .  DULoxetine (CYMBALTA) 60 MG capsule, TAKE 1 CAPSULE BY MOUTH EVERY DAY, Disp: 90 capsule, Rfl: 1 .  felodipine (PLENDIL) 10 MG 24 hr tablet, Take 1 tablet (10 mg total) by mouth daily., Disp: 30 tablet, Rfl: 0 .  lisinopril (ZESTRIL) 40 MG tablet, TAKE 1 TABLET (40 MG TOTAL) BY MOUTH IN THE MORNING AND AT BEDTIME., Disp: 60 tablet, Rfl: 0 .  nicotine (NICODERM CQ - DOSED IN MG/24 HOURS) 21 mg/24hr patch, One 21mg  patch chest wall daily (may substitute generic) (Patient not taking: Reported on 01/05/2020), Disp: 28 patch, Rfl: 0 .  potassium chloride SA (KLOR-CON) 20 MEQ tablet, Take 1 tablet (20 mEq total) by mouth 2  (two) times daily., Disp: 60 tablet, Rfl: 5 .  QUEtiapine (SEROQUEL) 200 MG tablet, 1 tab po qAM x 7d, then increase to 2 tabs po qAM, Disp: 52 tablet, Rfl: 0 .  QUEtiapine (SEROQUEL) 400 MG tablet, 1 tab po qhs, Disp: 90 tablet, Rfl: 1 .  thiamine (VITAMIN B-1) 100 MG tablet, Take 1 tablet (100 mg total) by mouth daily., Disp: 30 tablet, Rfl: 0 .  traZODone (DESYREL) 100 MG tablet, Take 2 tablets (200 mg total) by mouth at bedtime., Disp: 180 tablet, Rfl: 1  EXAM:  VITALS per patient if applicable: BP Marland Kitchen)  151/92 (BP Location: Left Arm, Patient Position: Sitting, Cuff Size: Normal)    GENERAL: alert, oriented, sounds well and in no acute distress No further exam b/c audio visit only.  LABS: none today    Chemistry      Component Value Date/Time   NA 137 01/05/2020 1423   NA 134 (A) 01/29/2017 0000   K 4.8 01/05/2020 1423   CL 105 01/05/2020 1423   CO2 26 01/05/2020 1423   BUN 13 01/05/2020 1423   BUN 28 (A) 01/29/2017 0000   CREATININE 0.93 01/05/2020 1423   CREATININE 1.01 04/03/2018 1528      Component Value Date/Time   CALCIUM 9.1 01/05/2020 1423   ALKPHOS 92 09/23/2019 1451   AST 21 09/23/2019 1451   ALT 11 09/23/2019 1451   BILITOT 0.9 09/23/2019 1451      ASSESSMENT AND PLAN:  Discussed the following assessment and plan:  1) Recurrent MDD, possible bipolar disorder.   No change. Will increase daytime dose of seroquel to 400 mg and continue hs dose at 400 mg. Continue cymbalta 60mg  qd.  2) Uncontrolled HTN: RECURRENT PROBLEMS WITH PT GETTING/TAKING MEDS AS INSTRUCTED. He will continue on lisinopril 40 mg bid and he'll check w/his pharmacy now about getting the felodipine I sent in 2 wks ago and start taking it.  Continue to monitor bp/hr at home. All visits must be in-person from here on out b/c he can never get reception for video visit, plus even telephone service is poor.   I discussed the assessment and treatment plan with the patient. The patient was  provided an opportunity to ask questions and all were answered. The patient agreed with the plan and demonstrated an understanding of the instructions.   Spent 15 min with pt today, with >50% of this time spent in counseling and care coordination regarding the above problems.  The patient was advised to call back or seek an in-person evaluation if the symptoms worsen or if the condition fails to improve as anticipated.  F/u: 2 wks.  Signed:  Crissie Sickles, MD           01/21/2020

## 2020-01-21 NOTE — Telephone Encounter (Signed)
RF request for Seroquel LOV:01/05/20 Next ov: 01/21/20 Last written:01/05/20(52,0)   Please advise. Medication pending. Pt has virtual appt today

## 2020-02-04 ENCOUNTER — Ambulatory Visit (INDEPENDENT_AMBULATORY_CARE_PROVIDER_SITE_OTHER): Payer: Medicare Other | Admitting: Family Medicine

## 2020-02-04 ENCOUNTER — Other Ambulatory Visit: Payer: Self-pay

## 2020-02-04 ENCOUNTER — Encounter: Payer: Self-pay | Admitting: Family Medicine

## 2020-02-04 VITALS — BP 82/58 | HR 71 | Temp 98.4°F | Resp 16 | Ht 67.0 in | Wt 174.2 lb

## 2020-02-04 DIAGNOSIS — R031 Nonspecific low blood-pressure reading: Secondary | ICD-10-CM | POA: Diagnosis not present

## 2020-02-04 DIAGNOSIS — F331 Major depressive disorder, recurrent, moderate: Secondary | ICD-10-CM | POA: Diagnosis not present

## 2020-02-04 DIAGNOSIS — I10 Essential (primary) hypertension: Secondary | ICD-10-CM

## 2020-02-04 MED ORDER — VIIBRYD 10 MG PO TABS: 10.0000 mg | ORAL_TABLET | Freq: Every day | ORAL | 0 refills | Status: DC

## 2020-02-04 NOTE — Progress Notes (Signed)
OFFICE VISIT  02/04/2020   CC:  Chief Complaint  Patient presents with  . Follow-up    HTN, MDD, pt is fasting   HPI:    Patient is a 59 y.o. Caucasian male who presents for 2 week f/u HTN and recurrent MDD with significant anxiety as well. A/P as of last visit: "1) Recurrent MDD, possible bipolar disorder.   No change. Will increase daytime dose of seroquel to 400 mg and continue hs dose at 400 mg. Continue cymbalta 60mg  qd.  2) Uncontrolled HTN: RECURRENT PROBLEMS WITH PT GETTING/TAKING MEDS AS INSTRUCTED. He will continue on lisinopril 40 mg bid and he'll check w/his pharmacy now about getting the felodipine I sent in 2 wks ago and start taking it.  Continue to monitor bp/hr at home. All visits must be in-person from here on out b/c he can never get reception for video visit, plus even telephone service is poor."  Interim hx: Reviewed home bp measurements on the wrist cuff he brought in today.  Range 188-416 systolic and 60-63 diastolic since adding felodipine to his lisinopril.  Denies side effect from meds.   Bp low here today but he denies dizziness, fatigue, weakness, CP, SOB, nausea, or diaphoresis. Reports 100% correct compliance with meds as rx'd.  He says his biggest issue is still depression: sad mood, crying spells, hopelessness, poor concentration, mild chronic fatigue.  Still functioning ok->eating, drinking, gets out of home some, talks to family and they know about his issues. Denies self medicating but does say he occ uses marijuana.  Denies alc/drugs. He has felt absolutely no improvement on duloxetine or since adding a morning dose of seroquel and up titrating the morning dose x 1. Has had past failure of several other antidepressants.  ROS: no fevers, no wheezing, no cough, no dizziness, no HAs, no rashes, no melena/hematochezia.  No polyuria or polydipsia.  No myalgias or arthralgias.  No new focal weakness, paresthesias, or tremors.  No acute vision or hearing  abnormalities. No n/v/d or abd pain.  No palpitations.     Past Medical History:  Diagnosis Date  . Alcoholism ([place])    Continues to abuse ETOH as of 01/2017; pt has poor insight into his problem.  Pt states that as of 04/2017 he has been 3 monthds sober.  . Anxiety and depression   . Arthritis    "hands, legs" (12/23/2017)  . Chronic joint pain   . COPD (chronic obstructive pulmonary disease) ([place])   . Depression   . Frequent headaches    "depends on BP" (12/23/2017)  . History of kidney stones   . Hypertension   . Insomnia 08/26/2017   08/23/17 ED visit for adverse rxn to taking ambien, PLUS + marijuana on UDS her and in ED---NO FURTHER AMBIEN OR ANY OTHER CONTROLLED SUBSTANCES WILL BE PRESCRIBED BY ME--PM  . Pneumothorax, right   . Polysubstance abuse ([place])   . Poor historian    UNRELIABLE HISTORIAN  . 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  Baclofen oral no help.  Botox inj started by Dr. Posey Pronto 07/2018--no help x 1.  Inj #2 done 10/2018.  Marland Kitchen Stroke ([place]) 2012   LUE residual deficit: flexion contracture and weakness of L hand.  Old bilateral cerebral infarcts involving the right frontal and parietal lobes as well as the left occipital lobe.  Baclofen and botox via neuro for L arm contracture.  . Stroke ([place])    "  3 mini strokes since 2012" (12/23/2017)  . Subdural hematoma ([place]) 01/29/2017   Subacute right-sided frontal subdural hematoma: found on CT in [place] ED, transferred to [facility] where this was non-operatively managed.  Pt admitted to the MDs at [facility] that he still drinks lots of whisky daily and they surmised that his frequent falls of late are due to this and frequent use of OTC sleep aids.  Near complete resolution on CT 03/27/18.  . Venous insufficiency of both lower extremities     Past Surgical History:  Procedure Laterality Date  . CHEST TUBE INSERTION    . LUNG SURGERY    . SHOULDER OPEN ROTATOR CUFF REPAIR Bilateral     Multiple rotator cuff surgeries on both sides.  . TONSILLECTOMY    . TRANSTHORACIC ECHOCARDIOGRAM  09/24/2019   EF 55-60%, grd I DD.  Marland Kitchen TYMPANOPLASTY Right   . US CAROTIDS  09/24/2019   Minimal atherosclerotic plaque bilat; no flow obstruction  . VIDEO ASSISTED THORACOSCOPY (VATS) W/TALC PLEUADESIS  2010 X2    Outpatient Medications Prior to Visit  Medication Sig Dispense Refill  . aspirin EC 81 MG EC tablet Take 1 tablet (81 mg total) by mouth daily. 30 tablet 0  . atorvastatin (LIPITOR) 40 MG tablet Take 1 tablet (40 mg total) by mouth daily at 6 PM. 90 tablet 1  . felodipine (PLENDIL) 10 MG 24 hr tablet Take 1 tablet (10 mg total) by mouth daily. 30 tablet 0  . lisinopril (ZESTRIL) 40 MG tablet Take 1 tablet (40 mg total) by mouth in the morning and at bedtime. 60 tablet 6  . potassium chloride SA (KLOR-CON) 20 MEQ tablet Take 1 tablet (20 mEq total) by mouth 2 (two) times daily. 60 tablet 5  . QUEtiapine (SEROQUEL) 400 MG tablet 1 tab po qhs 60 tablet 0  . thiamine (VITAMIN B-1) 100 MG tablet Take 1 tablet (100 mg total) by mouth daily. 30 tablet 0  . traZODone (DESYREL) 100 MG tablet Take 2 tablets (200 mg total) by mouth at bedtime. 180 tablet 1  . DULoxetine (CYMBALTA) 60 MG capsule TAKE 1 CAPSULE BY MOUTH EVERY DAY 90 capsule 1  . nicotine (NICODERM CQ - DOSED IN MG/24 HOURS) 21 mg/24hr patch One 21mg  patch chest wall daily (may substitute generic) (Patient not taking: Reported on 01/05/2020) 28 patch 0   No facility-administered medications prior to visit.    Allergies  Allergen Reactions  . Ambien [Zolpidem] Other (See Comments)    Pt took too much, acted out a bad dream/was throwing things, etc--ED visit (07/2017)    ROS As per HPI  PE: Blood pressure (!) 82/58, pulse 71, temperature 98.4 F (36.9 C), temperature source Temporal, resp. rate 16, height 5\' 7"  (1.702 m), weight 174 lb 3.2 oz (79 kg), SpO2 97 %. Gen: Alert, well appearing.  Patient is oriented to  person, place, time, and situation. AFFECT: pleasant, lucid thought and speech. CV: RRR, no m/r/g.   LUNGS: CTA bilat, nonlabored resps, good aeration in all lung fields. EXT: no clubbing or cyanosis.  no edema.    LABS:    Chemistry      Component Value Date/Time   NA 137 01/05/2020 1423   NA 134 (A) 01/29/2017 0000   K 4.8 01/05/2020 1423   CL 105 01/05/2020 1423   CO2 26 01/05/2020 1423   BUN 13 01/05/2020 1423   BUN 28 (A) 01/29/2017 0000   CREATININE 0.93 01/05/2020 1423   CREATININE 1.01 04/03/2018 1528  Component Value Date/Time   CALCIUM 9.1 01/05/2020 1423   ALKPHOS 92 09/23/2019 1451   AST 21 09/23/2019 1451   ALT 11 09/23/2019 1451   BILITOT 0.9 09/23/2019 1451     Lab Results  Component Value Date   CHOL 136 12/10/2019   HDL 57.80 12/10/2019   LDLCALC 49 12/10/2019   TRIG 148.0 12/10/2019   CHOLHDL 2 12/10/2019    IMPRESSION AND PLAN:  1) HTN: low bp readings here (checked manually in each arm) but completely asymptomatic.  Home monitoring quite normal. I'm not going to change anything with bp meds at this time. He knows to stop meds appropriately if he starts seeing low bp's on his home checks OR if getting dizzy, lethargic, presyncopal, etc.  2) Recurrent MDD, ? Bipolar II, hx of polysubstance (including alc) abuse. No benzos or other controlled substances. Plan: switch directly over from duloxetine to viibryd 10mg  qd, and drop the morning dose of quetiapine. Reviewed these changes with him in detail TWICE today to make sure it was clearly understood. At f/u we'll up-titrate viibryd to 20mg  qd. He has used the quetiapine 600 mg qhs dose for a long time for help with his bad insomnia so I'll likely not ever get him of of this dose of this med.  An After Visit Summary was printed and given to the patient.  FOLLOW UP: Return in about 2 weeks (around 02/18/2020) for f/u mood/bp.  Signed:  Crissie Sickles, MD           02/04/2020

## 2020-02-05 ENCOUNTER — Other Ambulatory Visit: Payer: Self-pay | Admitting: Family Medicine

## 2020-02-11 ENCOUNTER — Other Ambulatory Visit: Payer: Self-pay | Admitting: Family Medicine

## 2020-02-15 ENCOUNTER — Other Ambulatory Visit: Payer: Self-pay

## 2020-02-15 NOTE — Telephone Encounter (Signed)
Pt's last visit was 02/04/20, coming in later this week on 4/23. During visit, he was advised to continue 400mg  dose of Seroquel and drop morning dose.  LOV:02/04/20 Next ov: 02/18/20 Last written:01/21/20 (60,0)  Medication pending

## 2020-02-15 NOTE — Telephone Encounter (Signed)
Patient called in requesting a refill on his medication QUEtiapine (SEROQUEL) 400 MG tablet  Says he takes 200mg  day time and 400mg  at night    [id number] - [place], [place] - [address]    Please call advise

## 2020-02-16 MED ORDER — QUETIAPINE FUMARATE 400 MG PO TABS: ORAL_TABLET | ORAL | 1 refills | Status: DC

## 2020-02-16 NOTE — Telephone Encounter (Signed)
LM for pt to returncall

## 2020-02-16 NOTE — Telephone Encounter (Signed)
YES he was supposed to have stopped his morning dose (the 200 mg dose).

## 2020-02-16 NOTE — Telephone Encounter (Signed)
Pt advised he should only be taking 400mg  dose. He also asked about BP med, Lisinopril advised that refills should be available.

## 2020-02-18 ENCOUNTER — Ambulatory Visit: Payer: Medicare Other | Admitting: Family Medicine

## 2020-02-18 ENCOUNTER — Telehealth: Payer: Self-pay

## 2020-02-18 NOTE — Telephone Encounter (Signed)
Patient called in wanting to reschedule his appt for 1 week out due to just being able to pick up med, Viibryd PCP started him on during last visit on 02/04/20. Appt rescheduled for next Friday.

## 2020-02-25 ENCOUNTER — Other Ambulatory Visit: Payer: Self-pay

## 2020-02-25 ENCOUNTER — Ambulatory Visit (INDEPENDENT_AMBULATORY_CARE_PROVIDER_SITE_OTHER): Payer: Medicare Other | Admitting: Family Medicine

## 2020-02-25 ENCOUNTER — Encounter: Payer: Self-pay | Admitting: Family Medicine

## 2020-02-25 VITALS — BP 110/78 | HR 75 | Temp 98.1°F | Resp 16 | Ht 67.0 in | Wt 177.4 lb

## 2020-02-25 DIAGNOSIS — H9193 Unspecified hearing loss, bilateral: Secondary | ICD-10-CM | POA: Diagnosis not present

## 2020-02-25 MED ORDER — VILAZODONE HCL 20 MG PO TABS: ORAL_TABLET | ORAL | 0 refills | Status: DC

## 2020-02-25 NOTE — Progress Notes (Signed)
OFFICE VISIT  02/25/2020   CC:  Chief Complaint  Patient presents with  . Follow-up    mood/BP     HPI:    Patient is a 59 y.o. Caucasian male who presents for f/u HTN and recurrent MDD. A/P as of last visit: "1) HTN: low bp readings here (checked manually in each arm) but completely asymptomatic.  Home monitoring quite normal. I'm not going to change anything with bp meds at this time. He knows to stop meds appropriately if he starts seeing low bp's on his home checks OR if getting dizzy, lethargic, presyncopal, etc.  2) Recurrent MDD, ? Bipolar II, hx of polysubstance (including alc) abuse. No benzos or other controlled substances. Plan: switch directly over from duloxetine to viibryd 10mg  qd, and drop the morning dose of quetiapine. Reviewed these changes with him in detail TWICE today to make sure it was clearly understood. At f/u we'll up-titrate viibryd to 20mg  qd. He has used the quetiapine 600 mg qhs dose for a long time for help with his bad insomnia so I'll likely not ever get him off of this dose of this med."  INTERIM HX:  No signif change in mood or anxiety level. Has only been on the viibryd 10mg  qd  X 1 wk now. No adverse side effects.  No SI or HI.  Home bp's consistently 120-130/70s now.  He asks for referral to audiology today due to long hx of progressive hearing loss bilat.  Past Medical History:  Diagnosis Date  . Alcoholism ([place])    Continues to abuse ETOH as of 01/2017; pt has poor insight into his problem.  Pt states that as of 04/2017 he has been 3 monthds sober.  . Anxiety and depression   . Arthritis    "hands, legs" (12/23/2017)  . Chronic joint pain   . COPD (chronic obstructive pulmonary disease) ([place])   . Depression   . Frequent headaches    "depends on BP" (12/23/2017)  . History of kidney stones   . Hypertension   . Insomnia 08/26/2017   08/23/17 ED visit for adverse rxn to taking ambien, PLUS + marijuana on UDS her and in ED---NO  FURTHER AMBIEN OR ANY OTHER CONTROLLED SUBSTANCES WILL BE PRESCRIBED BY ME--PM  . Pneumothorax, right   . Polysubstance abuse ([place])   . Poor historian    UNRELIABLE HISTORIAN  . 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  Baclofen oral no help.  Botox inj started by Dr. Posey Pronto 07/2018--no help x 1.  Inj #2 done 10/2018.  Marland Kitchen Stroke ([place]) 2012   LUE residual deficit: flexion contracture and weakness of L hand.  Old bilateral cerebral infarcts involving the right frontal and parietal lobes as well as the left occipital lobe.  Baclofen and botox via neuro for L arm contracture.  . Stroke [facility])    "3 mini strokes since 2012" (12/23/2017)  . Subdural hematoma ([place]) 01/29/2017   Subacute right-sided frontal subdural hematoma: found on CT in [place] ED, transferred to [facility] where this was non-operatively managed.  Pt admitted to the MDs at [facility] that he still drinks lots of whisky daily and they surmised that his frequent falls of late are due to this and frequent use of OTC sleep aids.  Near complete resolution on CT 03/27/18.  . Venous insufficiency of both lower extremities     Past Surgical History:  Procedure Laterality Date  . CHEST  TUBE INSERTION    . LUNG SURGERY    . SHOULDER OPEN ROTATOR CUFF REPAIR Bilateral    Multiple rotator cuff surgeries on both sides.  . TONSILLECTOMY    . TRANSTHORACIC ECHOCARDIOGRAM  09/24/2019   EF 55-60%, grd I DD.  Marland Kitchen TYMPANOPLASTY Right   . US CAROTIDS  09/24/2019   Minimal atherosclerotic plaque bilat; no flow obstruction  . VIDEO ASSISTED THORACOSCOPY (VATS) W/TALC PLEUADESIS  2010 X2    Outpatient Medications Prior to Visit  Medication Sig Dispense Refill  . aspirin EC 81 MG EC tablet Take 1 tablet (81 mg total) by mouth daily. 30 tablet 0  . atorvastatin (LIPITOR) 40 MG tablet Take 1 tablet (40 mg total) by mouth daily at 6 PM. 90 tablet 1  . felodipine (PLENDIL) 10 MG 24 hr tablet TAKE 1 TABLET BY  MOUTH EVERY DAY 30 tablet 0  . lisinopril (ZESTRIL) 40 MG tablet Take 1 tablet (40 mg total) by mouth in the morning and at bedtime. 60 tablet 6  . nicotine (NICODERM CQ - DOSED IN MG/24 HOURS) 21 mg/24hr patch One 21mg  patch chest wall daily (may substitute generic) 28 patch 0  . potassium chloride SA (KLOR-CON) 20 MEQ tablet Take 1 tablet (20 mEq total) by mouth 2 (two) times daily. 60 tablet 5  . QUEtiapine (SEROQUEL) 400 MG tablet 1 tab po qhs 180 tablet 1  . thiamine (VITAMIN B-1) 100 MG tablet Take 1 tablet (100 mg total) by mouth daily. 30 tablet 0  . traZODone (DESYREL) 100 MG tablet Take 2 tablets (200 mg total) by mouth at bedtime. 180 tablet 1  . Vilazodone HCl (VIIBRYD) 10 MG TABS Take 1 tablet (10 mg total) by mouth daily. 30 tablet 0   No facility-administered medications prior to visit.    Allergies  Allergen Reactions  . Ambien [Zolpidem] Other (See Comments)    Pt took too much, acted out a bad dream/was throwing things, etc--ED visit (07/2017)    ROS As per HPI  PE: Blood pressure 110/78, pulse 75, temperature 98.1 F (36.7 C), temperature source Temporal, resp. rate 16, height 5\' 7"  (1.702 m), weight 177 lb 6.4 oz (80.5 kg), SpO2 95 %. Gen: Alert, well appearing.  Patient is oriented to person, place, time, and situation. AFFECT: pleasant, lucid thought and speech. No further exam today.  LABS:  Lab Results  Component Value Date   TSH 1.03 12/10/2019   Lab Results  Component Value Date   WBC 9.6 09/24/2019   HGB 12.9 (L) 09/24/2019   HCT 38.6 (L) 09/24/2019   MCV 94.6 09/24/2019   PLT 238 09/24/2019   Lab Results  Component Value Date   CREATININE 0.93 01/05/2020   BUN 13 01/05/2020   NA 137 01/05/2020   K 4.8 01/05/2020   CL 105 01/05/2020   CO2 26 01/05/2020   Lab Results  Component Value Date   ALT 11 09/23/2019   AST 21 09/23/2019   ALKPHOS 92 09/23/2019   BILITOT 0.9 09/23/2019   Lab Results  Component Value Date   CHOL 136 12/10/2019    Lab Results  Component Value Date   HDL 57.80 12/10/2019   Lab Results  Component Value Date   LDLCALC 49 12/10/2019   Lab Results  Component Value Date   TRIG 148.0 12/10/2019   Lab Results  Component Value Date   CHOLHDL 2 12/10/2019   Lab Results  Component Value Date   HGBA1C (H) 11/02/2010    5.8 (  NOTE)                                                                       According to the ADA Clinical Practice Recommendations for 2011, when HbA1c is used as a screening test:   >=6.5%   Diagnostic of Diabetes Mellitus           (if abnormal result  is confirmed)  5.7-6.4%   Increased risk of developing Diabetes Mellitus  References:Diagnosis and Classification of Diabetes Mellitus,Diabetes [id number],07(EMLJQ 1):S62-S69 and Standards of Medical Care in         Diabetes - 2011,Diabetes GBEE,1007,12  (Suppl 1):S11-S61.    IMPRESSION AND PLAN:  1) HTN: The current medical regimen is effective;  continue present plan and medications. No labs needed today.  2) MDD, recurrent.  Also GAD. Hx of polysubst abuse. Tolerating viibryd 10mg  qd x 1 wk. Instructions: Take one viibrid 10mg  tab once a day for 1 more week, then increase to TWO tabs daily. When you run out of your 10mg  tabs, call pharmacy to request the new rx I sent in today for the 20mg  strength pill. F/u in office 1 mo.  3) Bilat hearing loss: referral to [facility] today.  An After Visit Summary was printed and given to the patient.  FOLLOW UP: Return in about 4 weeks (around 03/24/2020).  Signed:  Crissie Sickles, MD           02/25/2020

## 2020-02-25 NOTE — Patient Instructions (Signed)
Take one viibrid 10mg  tab once a day for 1 more week, then increase to TWO tabs daily. When you run out of your 10mg  tabs, call pharmacy to request the new rx I sent in today for the 20mg  strength pill.

## 2020-03-03 ENCOUNTER — Telehealth: Payer: Self-pay

## 2020-03-03 NOTE — Telephone Encounter (Signed)
Patient called at 4:55pm stating that he didn't have any blood pressure meds, I asked what meds he took for his blood pressure. He said Lisinopril. There were refills left. He asked if someone was here to call in meds to pharmacy, by this time, nurses were walking out the door (5:00pm) and I told him to call his pharmacy that he had refills on his prescription.  Patient was fine with that and he will call his pharmacy to get blood pressure med refilled

## 2020-03-04 NOTE — Telephone Encounter (Signed)
Pt should have at least 4 refills left on this medication.

## 2020-03-09 ENCOUNTER — Other Ambulatory Visit: Payer: Self-pay | Admitting: Family Medicine

## 2020-03-10 ENCOUNTER — Other Ambulatory Visit: Payer: Self-pay | Admitting: Family Medicine

## 2020-03-24 ENCOUNTER — Other Ambulatory Visit: Payer: Self-pay

## 2020-03-24 ENCOUNTER — Encounter: Payer: Self-pay | Admitting: Family Medicine

## 2020-03-24 ENCOUNTER — Ambulatory Visit (INDEPENDENT_AMBULATORY_CARE_PROVIDER_SITE_OTHER): Payer: Medicare Other | Admitting: Family Medicine

## 2020-03-24 VITALS — BP 173/97 | HR 77 | Temp 98.3°F | Resp 16 | Ht 67.0 in | Wt 176.2 lb

## 2020-03-24 DIAGNOSIS — I10 Essential (primary) hypertension: Secondary | ICD-10-CM

## 2020-03-24 DIAGNOSIS — F17200 Nicotine dependence, unspecified, uncomplicated: Secondary | ICD-10-CM | POA: Diagnosis not present

## 2020-03-24 DIAGNOSIS — F331 Major depressive disorder, recurrent, moderate: Secondary | ICD-10-CM | POA: Diagnosis not present

## 2020-03-24 DIAGNOSIS — F411 Generalized anxiety disorder: Secondary | ICD-10-CM

## 2020-03-24 MED ORDER — FELODIPINE ER 10 MG PO TB24: 10.0000 mg | ORAL_TABLET | Freq: Every day | ORAL | 1 refills | Status: DC

## 2020-03-24 MED ORDER — CHLORTHALIDONE 25 MG PO TABS: 25.0000 mg | ORAL_TABLET | Freq: Every day | ORAL | 1 refills | Status: DC

## 2020-03-24 MED ORDER — VILAZODONE HCL 20 MG PO TABS: ORAL_TABLET | ORAL | 2 refills | Status: DC

## 2020-03-24 NOTE — Progress Notes (Signed)
See med student note from this date. Signed:  Crissie Sickles, MD           03/24/2020

## 2020-03-24 NOTE — Progress Notes (Signed)
CC: Patient is a 59 y.o. Caucasian male who presents for 1 mo f/u HTN and MDD.  HPI:  A/P as of last visit: "1) HTN:The current medical regimen is effective; continue present plan and medications. No labs needed today.  2) MDD, recurrent. Also GAD. Hx of polysubst abuse. Tolerating viibryd 10mg  qd x 1 wk. Instructions:Take one viibrid 10mg  tab once a day for 1 more week, then increase to TWO tabs daily. When you run out of your 10mg  tabs, call pharmacy to request the new rx I sent in today for the 20mg  strength pill. F/u in office 1 mo.  3) Bilat hearing loss: referral to [facility] today."   States vilazodone has not helped even when moving up to 2 tabs, says he has definitely been taking this med every day.  He asks for xanax again today. Took a friend's xanax recently and says it helped significantly.  I reiterated that I don't think taking xanax is a good idea given his hx of substance abuse.   NO change in depressed mood, anxiety/stress.  Says all this would be better if he had a job--says he is so frustrated and stressed with just sitting around all the time at home alone.  His is unable to work due to his CVA with residual neurologic deficits in L arm.  No SI or HI.  He says trazodone and seroquel help for sleep   Talked about seeing a counselor, he has in past x 2 yrs per his report and does not think it helped  Says he wants to be less irritable  Denies any use of any alcohol or drugs anymore.   BP at home: ranging from 140s-190s / 90s-120s  He is adamant that he is taking his lisinopril 40mg  bid and felodipine 10mg  qd. At office: 173/97 Diet: eating in middle of night also ate pork chops with salt, t bone steaks day before , eats potatoes  Exercise: yard work every day  Still smoking, Says insurance would not pay for nicotine patch.  Chantix no help in the past.  Has not tried QUIT LINE.    ROS: no fevers, no CP, no SOB, no wheezing, no cough, no dizziness, no  HAs, no rashes, no melena/hematochezia.  No polyuria or polydipsia.  No myalgias or arthralgias.  No focal weakness, paresthesias, or tremors.  No acute vision or hearing abnormalities. No n/v/d or abd pain.  No palpitations.      PMH: Past Medical History:  Diagnosis Date  . Alcoholism ([place])    Continues to abuse ETOH as of 01/2017; pt has poor insight into his problem.  Pt states that as of 04/2017 he has been 3 monthds sober.  . Anxiety and depression   . Arthritis    "hands, legs" (12/23/2017)  . Chronic joint pain   . COPD (chronic obstructive pulmonary disease) ([place])   . Depression   . Frequent headaches    "depends on BP" (12/23/2017)  . History of kidney stones   . Hypertension   . Insomnia 08/26/2017   08/23/17 ED visit for adverse rxn to taking ambien, PLUS + marijuana on UDS her and in ED---NO FURTHER AMBIEN OR ANY OTHER CONTROLLED SUBSTANCES WILL BE PRESCRIBED BY ME--PM  . Pneumothorax, right   . Polysubstance abuse ([place])   . Poor historian    UNRELIABLE HISTORIAN  . 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  Baclofen oral no  help.  Botox inj started by Dr. Posey Pronto 07/2018--no help x 1.  Inj #2 done 10/2018.  Marland Kitchen Stroke ([place]) 2012   LUE residual deficit: flexion contracture and weakness of L hand.  Old bilateral cerebral infarcts involving the right frontal and parietal lobes as well as the left occipital lobe.  Baclofen and botox via neuro for L arm contracture.  . Stroke [facility])    "3 mini strokes since 2012" (12/23/2017)  . Subdural hematoma ([place]) 01/29/2017   Subacute right-sided frontal subdural hematoma: found on CT in [place] ED, transferred to [facility] where this was non-operatively managed.  Pt admitted to the MDs at [facility] that he still drinks lots of whisky daily and they surmised that his frequent falls of late are due to this and frequent use of OTC sleep aids.  Near complete resolution on CT 03/27/18.  . Venous  insufficiency of both lower extremities     M/A: Current Outpatient Medications on File Prior to Visit  Medication Sig Dispense Refill  . aspirin EC 81 MG EC tablet Take 1 tablet (81 mg total) by mouth daily. 30 tablet 0  . atorvastatin (LIPITOR) 40 MG tablet Take 1 tablet (40 mg total) by mouth daily at 6 PM. 90 tablet 1  . felodipine (PLENDIL) 10 MG 24 hr tablet TAKE 1 TABLET BY MOUTH EVERY DAY 30 tablet 0  . lisinopril (ZESTRIL) 40 MG tablet Take 1 tablet (40 mg total) by mouth in the morning and at bedtime. 60 tablet 6  . potassium chloride SA (KLOR-CON) 20 MEQ tablet Take 1 tablet (20 mEq total) by mouth 2 (two) times daily. 60 tablet 5  . QUEtiapine (SEROQUEL) 400 MG tablet 1 tab po qhs 180 tablet 1  . thiamine (VITAMIN B-1) 100 MG tablet Take 1 tablet (100 mg total) by mouth daily. 30 tablet 0  . traZODone (DESYREL) 100 MG tablet Take 2 tablets (200 mg total) by mouth at bedtime. 180 tablet 1  . Vilazodone HCl 20 MG TABS 1 tab po qd 30 tablet 0  . nicotine (NICODERM CQ - DOSED IN MG/24 HOURS) 21 mg/24hr patch One 21mg  patch chest wall daily (may substitute generic) (Patient not taking: Reported on 03/24/2020) 28 patch 0   No current facility-administered medications on file prior to visit.   Allergies  Allergen Reactions  . Ambien [Zolpidem] Other (See Comments)    Pt took too much, acted out a bad dream/was throwing things, etc--ED visit (07/2017)    FH: Family History  Problem Relation Age of Onset  . Arthritis Mother   . Heart disease Father   . Hypertension Father   . Breast cancer Maternal Aunt   . Alcohol abuse Maternal Uncle   . Lung cancer Maternal Aunt   . Cancer Brother        bone    SH: Social History   Socioeconomic History  . Marital status: Divorced    Spouse name: Not on file  . Number of children: 2  . Years of education: Not on file  . Highest education level: Not on file  Occupational History  . Not on file  Tobacco Use  . Smoking status:  Current Every Day Smoker    Packs/day: 1.00    Years: 44.00    Pack years: 44.00    Types: Cigarettes  . Smokeless tobacco: Never Used  Substance and Sexual Activity  . Alcohol use: No    Comment: Quit summer 2018. (12/23/2017)  . Drug use: Yes  Frequency: 2.0 times per week    Types: Marijuana    Comment: 12/23/2017 "no more than once/day; ave 3-4 days/wk"  . Sexual activity: Not Currently  Other Topics Concern  . Not on file  Social History Narrative   Divorced, 1 son and 1 daughter in [place].   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   Smokes: 80 pack-yr hx, current as of 10/2016.   Alc: 1-2 pint of Jim Beam per week.   Was in AA > 20 yrs ago.   Marijuana regularly.   "Lots of drugs" in the past.   Social Determinants of Health   Financial Resource Strain:   . Difficulty of Paying Living Expenses:   Food Insecurity:   . Worried About Charity fundraiser in the Last Year:   . Arboriculturist in the Last Year:   Transportation Needs:   . Film/video editor (Medical):   Marland Kitchen Lack of Transportation (Non-Medical):   Physical Activity:   . Days of Exercise per Week:   . Minutes of Exercise per Session:   Stress:   . Feeling of Stress :   Social Connections:   . Frequency of Communication with Friends and Family:   . Frequency of Social Gatherings with Friends and Family:   . Attends Religious Services:   . Active Member of Clubs or Organizations:   . Attends Archivist Meetings:   Marland Kitchen Marital Status:     ROS: Review of Systems  Constitutional: Negative.   HENT: Negative.   Eyes: Negative.   Respiratory: Negative.   Cardiovascular: Negative.   Gastrointestinal: Negative.   Genitourinary: Negative.   Musculoskeletal: Positive for back pain, joint pain and myalgias (chx muscle and joint pain unchanged for the last few years).  Skin: Negative.   Neurological: Negative.  Negative for dizziness, tingling, tremors, sensory change, speech change,  focal weakness, seizures, loss of consciousness, weakness and headaches.  Endo/Heme/Allergies: Negative.   Psychiatric/Behavioral: Positive for depression. Negative for hallucinations, memory loss, substance abuse and suicidal ideas. The patient is nervous/anxious and has insomnia.     PE: Vitals with BMI 03/24/2020 02/25/2020 02/04/2020  Height 5\' 7"  5\' 7"  -  Weight 176 lbs 3 oz 177 lbs 6 oz -  BMI 27.78 24.23 -  Systolic 536 144 82  Diastolic 97 78 58  Pulse 77 75 -    Physical Exam  Constitutional: He is oriented to person, place, and time. No distress.  HENT:  Head: Normocephalic and atraumatic.  Right Ear: External ear normal.  Left Ear: External ear normal.  Nose: Nose normal.  Mouth/Throat: Oropharynx is clear and moist. No oropharyngeal exudate.  Neck: No JVD present. No tracheal deviation present. No thyromegaly present.  Cardiovascular: Normal rate, regular rhythm and intact distal pulses. Exam reveals no gallop and no friction rub.  No murmur heard. Distant heart sounds likely from COPD   Pulmonary/Chest: Effort normal and breath sounds normal. No stridor. No respiratory distress. He has no wheezes. He has no rales. He exhibits no tenderness.  Musculoskeletal:        General: Edema (trace b/l in legs) present.     Cervical back: Normal range of motion and neck supple.  Lymphadenopathy:    He has no cervical adenopathy.  Neurological: He is alert and oriented to person, place, and time.  Skin: Skin is warm and dry. No rash noted. He is not diaphoretic. No erythema. No pallor.  Psychiatric: Memory and judgment normal.  Sullen and disgruntled mood    Labs: Recent Results (from the past 2160 hour(s))  Basic metabolic panel     Status: None   Collection Time: 01/05/20  2:23 PM  Result Value Ref Range   Sodium 137 135 - 145 mEq/L   Potassium 4.8 3.5 - 5.1 mEq/L   Chloride 105 96 - 112 mEq/L   CO2 26 19 - 32 mEq/L   Glucose, Bld 88 70 - 99 mg/dL   BUN 13 6 - 23 mg/dL    Creatinine, Ser 0.93 0.40 - 1.50 mg/dL   GFR 82.66 >60.00 mL/min   Calcium 9.1 8.4 - 10.5 mg/dL     A/P: In summary, Ichiro is a 59 year old man with a past medical history of HTN, MDD, and GAD who presents for 1 month follow up.  His anxiety and depression do not feel well controlled. On physical exam, he is disgruntled and sullen but otherwise unremarkable.   My plan is   1) HTN: not well controlled.  Start 25mg  chlorthalidone ; continue other meds as rxd.   2) MDD/GAD: much of this is due to his poor social situation, relative isolation. Talked about importance of staying on meds as indicated as they do not improve sx immediately; suggested seeing a counselor but he declined.  He stated he wanted to be referred to a psychiatrist today so I referred him to psychiatry in [place] per his request.  Strongly encouraged him to continue to avoid any alcohol or illicit drug use.  3) Tobacco dependence/cessation: congratulated on wanting to quit and encouraged to try to use nicotine gums, patches, and call 1-800-QUIT-NOW 410-781-4412).  Follow Up:  2 weeks for HTN f/u, will do BMP then  Signed: Bennie Dallas, MS3 24 Mar 2020   I personally was present during the history, physical exam, and medical decision-making activities of this service and have verified that the service and findings are accurately documented in the student's note. Signed:  Crissie Sickles, MD           03/24/2020

## 2020-03-24 NOTE — Patient Instructions (Signed)
Call [facility] tobacco Quit Line: [phone]

## 2020-04-16 ENCOUNTER — Other Ambulatory Visit: Payer: Self-pay | Admitting: Family Medicine

## 2020-04-19 ENCOUNTER — Other Ambulatory Visit: Payer: Self-pay | Admitting: Family Medicine

## 2020-04-20 ENCOUNTER — Other Ambulatory Visit: Payer: Self-pay

## 2020-04-21 ENCOUNTER — Ambulatory Visit (INDEPENDENT_AMBULATORY_CARE_PROVIDER_SITE_OTHER): Payer: Medicare Other | Admitting: Family Medicine

## 2020-04-21 ENCOUNTER — Encounter: Payer: Self-pay | Admitting: Family Medicine

## 2020-04-21 ENCOUNTER — Other Ambulatory Visit: Payer: Self-pay

## 2020-04-21 VITALS — BP 120/68 | HR 65 | Temp 97.7°F | Resp 16 | Ht 67.0 in | Wt 176.0 lb

## 2020-04-21 DIAGNOSIS — F411 Generalized anxiety disorder: Secondary | ICD-10-CM

## 2020-04-21 DIAGNOSIS — F331 Major depressive disorder, recurrent, moderate: Secondary | ICD-10-CM | POA: Diagnosis not present

## 2020-04-21 DIAGNOSIS — I10 Essential (primary) hypertension: Secondary | ICD-10-CM

## 2020-04-21 LAB — BASIC METABOLIC PANEL

## 2020-04-21 MED ORDER — CHLORTHALIDONE 25 MG PO TABS: 25.0000 mg | ORAL_TABLET | Freq: Every day | ORAL | 1 refills | Status: DC

## 2020-04-21 MED ORDER — MIRTAZAPINE 15 MG PO TABS
ORAL_TABLET | ORAL | 0 refills | Status: DC
Start: 2020-04-21 — End: 2020-05-22

## 2020-04-21 NOTE — Patient Instructions (Signed)
Stop your vilazodone.  Start taking mirtazapine every night as instructed on the pill bottle.  Research [facility] and [facility] psychiatric offices and let me know if you would like a referral to one of these places.

## 2020-04-21 NOTE — Telephone Encounter (Signed)
Pt requesting Rx  Seroquel and Rx Viibryd refills Last OV 03/24/2020

## 2020-04-21 NOTE — Progress Notes (Signed)
OFFICE VISIT  04/21/2020   CC:  Chief Complaint  Patient presents with  . Follow-up    hypertension, pt is fasting   HPI:    Patient is a 59 y.o. Caucasian male who presents for f/u uncontrolled HTN and MDD/GAD.  A/P as of last visit: "1) HTN: not well controlled.  Start 25mg  chlorthalidone ; continue other meds as rxd.   2) MDD/GAD: much of this is due to his poor social situation, relative isolation. Talked about importance of staying on meds as indicated as they do not improve sx immediately; suggested seeing a counselor but he declined.  He stated he wanted to be referred to a psychiatrist today so I referred him to psychiatry in [place] per his request.  Strongly encouraged him to continue to avoid any alcohol or illicit drug use.  3) Tobacco dependence/cessation: congratulated on wanting to quit and encouraged to try to use nicotine gums, patches, and call 1-800-QUIT-NOW (786)693-5250)."  INTERIM HX: BPs better: wrist cuff 130/80 avg. Tolerating meds.  Says depression and anxiety no better. Has been on vilazodone for a total of about 2 mo now. Has failed fluoxetine and cymbalta trials. He continually requests xanax b/c he says this is the only thing that has helped his anxiety and sleep.  However, he has hx of substance abuse and I have reminded him many times that I won't rx him any controlled substances/meds with potential for abuse or diversion. He is frustrated but understands. Referral to psych office near [place] attempted but they denied him b/c "he wants xanax and we don't rx that med here and also pt needs higher level of care". They suggested we try "[facility]" or "[facility]". Unfortunately I don't know anything about either of these places.  Past Medical History:  Diagnosis Date  . Alcoholism ([place])    Continues to abuse ETOH as of 01/2017; pt has poor insight into his problem.  Pt states that as of 04/2017 he has been 3 monthds sober.  . Anxiety and depression    . Arthritis    "hands, legs" (12/23/2017)  . Chronic joint pain   . COPD (chronic obstructive pulmonary disease) ([place])   . Depression   . Frequent headaches    "depends on BP" (12/23/2017)  . History of kidney stones   . Hypertension   . Insomnia 08/26/2017   08/23/17 ED visit for adverse rxn to taking ambien, PLUS + marijuana on UDS her and in ED---NO FURTHER AMBIEN OR ANY OTHER CONTROLLED SUBSTANCES WILL BE PRESCRIBED BY ME--PM  . Pneumothorax, right   . Polysubstance abuse ([place])   . Poor historian    UNRELIABLE HISTORIAN  . 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  Baclofen oral no help.  Botox inj started by Dr. Posey Pronto 07/2018--no help x 1.  Inj #2 done 10/2018.  Marland Kitchen Stroke ([place]) 2012   LUE residual deficit: flexion contracture and weakness of L hand.  Old bilateral cerebral infarcts involving the right frontal and parietal lobes as well as the left occipital lobe.  Baclofen and botox via neuro for L arm contracture.  . Stroke [facility])    "3 mini strokes since 2012" (12/23/2017)  . Subdural hematoma ([place]) 01/29/2017   Subacute right-sided frontal subdural hematoma: found on CT in [place] ED, transferred to [facility] where this was non-operatively managed.  Pt admitted to the MDs at [facility] that he still drinks lots of whisky daily and  they surmised that his frequent falls of late are due to this and frequent use of OTC sleep aids.  Near complete resolution on CT 03/27/18.  . Venous insufficiency of both lower extremities     Past Surgical History:  Procedure Laterality Date  . CHEST TUBE INSERTION    . LUNG SURGERY    . SHOULDER OPEN ROTATOR CUFF REPAIR Bilateral    Multiple rotator cuff surgeries on both sides.  . TONSILLECTOMY    . TRANSTHORACIC ECHOCARDIOGRAM  09/24/2019   EF 55-60%, grd I DD.  Marland Kitchen TYMPANOPLASTY Right   . US CAROTIDS  09/24/2019   Minimal atherosclerotic plaque bilat; no flow obstruction  . VIDEO ASSISTED THORACOSCOPY  (VATS) W/TALC PLEUADESIS  2010 X2    Outpatient Medications Prior to Visit  Medication Sig Dispense Refill  . aspirin EC 81 MG EC tablet Take 1 tablet (81 mg total) by mouth daily. 30 tablet 0  . atorvastatin (LIPITOR) 40 MG tablet Take 1 tablet (40 mg total) by mouth daily at 6 PM. 90 tablet 1  . felodipine (PLENDIL) 10 MG 24 hr tablet Take 1 tablet (10 mg total) by mouth daily. 90 tablet 1  . lisinopril (ZESTRIL) 40 MG tablet Take 1 tablet (40 mg total) by mouth in the morning and at bedtime. 60 tablet 6  . potassium chloride SA (KLOR-CON) 20 MEQ tablet Take 1 tablet (20 mEq total) by mouth 2 (two) times daily. 60 tablet 5  . QUEtiapine (SEROQUEL) 400 MG tablet 1 tab po qhs 180 tablet 1  . thiamine (VITAMIN B-1) 100 MG tablet Take 1 tablet (100 mg total) by mouth daily. 30 tablet 0  . traZODone (DESYREL) 100 MG tablet Take 2 tablets (200 mg total) by mouth at bedtime. 180 tablet 1  . chlorthalidone (HYGROTON) 25 MG tablet TAKE 1 TABLET BY MOUTH EVERY DAY 30 tablet 0  . DULoxetine (CYMBALTA) 60 MG capsule Take 60 mg by mouth daily.    . Vilazodone HCl 20 MG TABS 1 tab po qd 30 tablet 2  . nicotine (NICODERM CQ - DOSED IN MG/24 HOURS) 21 mg/24hr patch One 21mg  patch chest wall daily (may substitute generic) (Patient not taking: Reported on 03/24/2020) 28 patch 0   No facility-administered medications prior to visit.    Allergies  Allergen Reactions  . Ambien [Zolpidem] Other (See Comments)    Pt took too much, acted out a bad dream/was throwing things, etc--ED visit (07/2017)    ROS As per HPI  PE: Blood pressure 120/68, pulse 65, temperature 97.7 F (36.5 C), temperature source Temporal, resp. rate 16, height 5\' 7"  (1.702 m), weight 176 lb (79.8 kg), SpO2 97 %. Gen: Alert, well appearing.  Patient is oriented to person, place, time, and situation. AFFECT: pleasant, lucid thought and speech. No further exam today.  LABS:    Chemistry      Component Value Date/Time   NA 137  01/05/2020 1423   NA 134 (A) 01/29/2017 0000   K 4.8 01/05/2020 1423   CL 105 01/05/2020 1423   CO2 26 01/05/2020 1423   BUN 13 01/05/2020 1423   BUN 28 (A) 01/29/2017 0000   CREATININE 0.93 01/05/2020 1423   CREATININE 1.01 04/03/2018 1528      Component Value Date/Time   CALCIUM 9.1 01/05/2020 1423   ALKPHOS 92 09/23/2019 1451   AST 21 09/23/2019 1451   ALT 11 09/23/2019 1451   BILITOT 0.9 09/23/2019 1451       IMPRESSION AND PLAN:  1) HTN: good control on current regimen. Checking BMET today since started chlorthalidone recently.  2) MDD/Anx:  Instructions: Stop your vilazodone. Start taking mirtazapine every night as instructed on the pill bottle. Research [facility] and [facility] psychiatric offices and let me know if you would like a referral to one of these places.   An After Visit Summary was printed and given to the patient.  FOLLOW UP: Return in about 4 weeks (around 05/19/2020) for f/u depression/anxiety and HTN.  Signed:  Crissie Sickles, MD           04/21/2020

## 2020-04-27 DIAGNOSIS — Z992 Dependence on renal dialysis: Secondary | ICD-10-CM

## 2020-04-27 DIAGNOSIS — N179 Acute kidney failure, unspecified: Secondary | ICD-10-CM

## 2020-04-27 HISTORY — DX: Dependence on renal dialysis: N17.9

## 2020-04-27 HISTORY — DX: Acute kidney failure, unspecified: Z99.2

## 2020-05-03 NOTE — Telephone Encounter (Signed)
Made in error

## 2020-05-09 ENCOUNTER — Inpatient Hospital Stay (HOSPITAL_COMMUNITY)
Admission: EM | Admit: 2020-05-09 | Discharge: 2020-05-12 | DRG: 682 | Disposition: A | Discharge status: Skilled Nursing Facility | Payer: Medicare Other | Attending: Family Medicine | Admitting: Family Medicine

## 2020-05-09 ENCOUNTER — Other Ambulatory Visit: Payer: Self-pay

## 2020-05-09 ENCOUNTER — Encounter (HOSPITAL_COMMUNITY): Payer: Self-pay | Admitting: *Deleted

## 2020-05-09 ENCOUNTER — Inpatient Hospital Stay (HOSPITAL_COMMUNITY): Payer: Medicare Other

## 2020-05-09 ENCOUNTER — Emergency Department (HOSPITAL_COMMUNITY): Payer: Medicare Other

## 2020-05-09 DIAGNOSIS — Z743 Need for continuous supervision: Secondary | ICD-10-CM | POA: Diagnosis not present

## 2020-05-09 DIAGNOSIS — E875 Hyperkalemia: Secondary | ICD-10-CM | POA: Diagnosis present

## 2020-05-09 DIAGNOSIS — G9340 Encephalopathy, unspecified: Secondary | ICD-10-CM

## 2020-05-09 DIAGNOSIS — I10 Essential (primary) hypertension: Secondary | ICD-10-CM | POA: Diagnosis present

## 2020-05-09 DIAGNOSIS — Z4901 Encounter for fitting and adjustment of extracorporeal dialysis catheter: Secondary | ICD-10-CM | POA: Diagnosis not present

## 2020-05-09 DIAGNOSIS — Z720 Tobacco use: Secondary | ICD-10-CM

## 2020-05-09 DIAGNOSIS — Z452 Encounter for adjustment and management of vascular access device: Secondary | ICD-10-CM | POA: Diagnosis not present

## 2020-05-09 DIAGNOSIS — I639 Cerebral infarction, unspecified: Secondary | ICD-10-CM | POA: Diagnosis not present

## 2020-05-09 DIAGNOSIS — E872 Acidosis: Secondary | ICD-10-CM | POA: Diagnosis present

## 2020-05-09 DIAGNOSIS — N179 Acute kidney failure, unspecified: Secondary | ICD-10-CM | POA: Diagnosis not present

## 2020-05-09 DIAGNOSIS — R0902 Hypoxemia: Secondary | ICD-10-CM | POA: Diagnosis not present

## 2020-05-09 DIAGNOSIS — Z9889 Other specified postprocedural states: Secondary | ICD-10-CM | POA: Diagnosis not present

## 2020-05-09 DIAGNOSIS — I959 Hypotension, unspecified: Secondary | ICD-10-CM | POA: Diagnosis present

## 2020-05-09 DIAGNOSIS — F1010 Alcohol abuse, uncomplicated: Secondary | ICD-10-CM | POA: Diagnosis present

## 2020-05-09 DIAGNOSIS — I69354 Hemiplegia and hemiparesis following cerebral infarction affecting left non-dominant side: Secondary | ICD-10-CM

## 2020-05-09 DIAGNOSIS — Z20822 Contact with and (suspected) exposure to covid-19: Secondary | ICD-10-CM | POA: Diagnosis present

## 2020-05-09 DIAGNOSIS — R9431 Abnormal electrocardiogram [ECG] [EKG]: Secondary | ICD-10-CM | POA: Diagnosis not present

## 2020-05-09 DIAGNOSIS — E860 Dehydration: Secondary | ICD-10-CM | POA: Diagnosis not present

## 2020-05-09 DIAGNOSIS — M24532 Contracture, left wrist: Secondary | ICD-10-CM | POA: Diagnosis not present

## 2020-05-09 DIAGNOSIS — R531 Weakness: Secondary | ICD-10-CM | POA: Diagnosis not present

## 2020-05-09 DIAGNOSIS — Z992 Dependence on renal dialysis: Secondary | ICD-10-CM

## 2020-05-09 DIAGNOSIS — G9389 Other specified disorders of brain: Secondary | ICD-10-CM | POA: Diagnosis not present

## 2020-05-09 DIAGNOSIS — G9341 Metabolic encephalopathy: Secondary | ICD-10-CM | POA: Diagnosis present

## 2020-05-09 DIAGNOSIS — J449 Chronic obstructive pulmonary disease, unspecified: Secondary | ICD-10-CM | POA: Diagnosis present

## 2020-05-09 DIAGNOSIS — Z7401 Bed confinement status: Secondary | ICD-10-CM | POA: Diagnosis not present

## 2020-05-09 DIAGNOSIS — E538 Deficiency of other specified B group vitamins: Secondary | ICD-10-CM | POA: Diagnosis present

## 2020-05-09 DIAGNOSIS — Z79899 Other long term (current) drug therapy: Secondary | ICD-10-CM

## 2020-05-09 DIAGNOSIS — R29818 Other symptoms and signs involving the nervous system: Secondary | ICD-10-CM | POA: Diagnosis not present

## 2020-05-09 DIAGNOSIS — Z8673 Personal history of transient ischemic attack (TIA), and cerebral infarction without residual deficits: Secondary | ICD-10-CM

## 2020-05-09 DIAGNOSIS — Z7982 Long term (current) use of aspirin: Secondary | ICD-10-CM | POA: Diagnosis not present

## 2020-05-09 DIAGNOSIS — N170 Acute kidney failure with tubular necrosis: Secondary | ICD-10-CM | POA: Diagnosis not present

## 2020-05-09 DIAGNOSIS — R404 Transient alteration of awareness: Secondary | ICD-10-CM | POA: Diagnosis not present

## 2020-05-09 DIAGNOSIS — G319 Degenerative disease of nervous system, unspecified: Secondary | ICD-10-CM | POA: Diagnosis not present

## 2020-05-09 DIAGNOSIS — F17210 Nicotine dependence, cigarettes, uncomplicated: Secondary | ICD-10-CM | POA: Diagnosis present

## 2020-05-09 DIAGNOSIS — Q600 Renal agenesis, unilateral: Secondary | ICD-10-CM | POA: Diagnosis not present

## 2020-05-09 DIAGNOSIS — M6281 Muscle weakness (generalized): Secondary | ICD-10-CM | POA: Diagnosis not present

## 2020-05-09 DIAGNOSIS — R5381 Other malaise: Secondary | ICD-10-CM | POA: Diagnosis not present

## 2020-05-09 DIAGNOSIS — F329 Major depressive disorder, single episode, unspecified: Secondary | ICD-10-CM | POA: Diagnosis present

## 2020-05-09 DIAGNOSIS — R2689 Other abnormalities of gait and mobility: Secondary | ICD-10-CM | POA: Diagnosis not present

## 2020-05-09 DIAGNOSIS — R2681 Unsteadiness on feet: Secondary | ICD-10-CM | POA: Diagnosis not present

## 2020-05-09 DIAGNOSIS — R69 Illness, unspecified: Secondary | ICD-10-CM | POA: Diagnosis not present

## 2020-05-09 DIAGNOSIS — N3289 Other specified disorders of bladder: Secondary | ICD-10-CM | POA: Diagnosis not present

## 2020-05-09 DIAGNOSIS — Z9181 History of falling: Secondary | ICD-10-CM | POA: Diagnosis not present

## 2020-05-09 LAB — URINALYSIS, ROUTINE W REFLEX MICROSCOPIC
Bilirubin Urine: NEGATIVE
Glucose, UA: NEGATIVE mg/dL
Ketones, ur: NEGATIVE mg/dL
Leukocytes,Ua: NEGATIVE
Nitrite: NEGATIVE
Protein, ur: NEGATIVE mg/dL
Specific Gravity, Urine: 1.015 (ref 1.005–1.030)
pH: 5 (ref 5.0–8.0)

## 2020-05-09 LAB — COMPREHENSIVE METABOLIC PANEL
ALT: 11 U/L (ref 0–44)
ALT: 9 U/L (ref 0–44)
AST: 14 U/L — ABNORMAL LOW (ref 15–41)
AST: 15 U/L (ref 15–41)
Albumin: 3.7 g/dL (ref 3.5–5.0)
Albumin: 2.7 g/dL — ABNORMAL LOW (ref 3.5–5.0)
Alkaline Phosphatase: 101 U/L (ref 38–126)
Alkaline Phosphatase: 73 U/L (ref 38–126)
Anion gap: 15 (ref 5–15)
Anion gap: 10 (ref 5–15)
BUN: 124 mg/dL — ABNORMAL HIGH (ref 8–23)
BUN: 58 mg/dL — ABNORMAL HIGH (ref 8–23)
CO2: 14 mmol/L — ABNORMAL LOW (ref 22–32)
CO2: 21 mmol/L — ABNORMAL LOW (ref 22–32)
Calcium: 9.6 mg/dL (ref 8.9–10.3)
Calcium: 8 mg/dL — ABNORMAL LOW (ref 8.9–10.3)
Chloride: 105 mmol/L (ref 98–111)
Chloride: 104 mmol/L (ref 98–111)
Creatinine, Ser: 9.66 mg/dL — ABNORMAL HIGH (ref 0.61–1.24)
Creatinine, Ser: 4.27 mg/dL — ABNORMAL HIGH (ref 0.61–1.24)
GFR calc Af Amer: 6 mL/min — ABNORMAL LOW (ref >60)
GFR calc Af Amer: 16 mL/min — ABNORMAL LOW (ref >60)
GFR calc non Af Amer: 5 mL/min — ABNORMAL LOW (ref >60)
GFR calc non Af Amer: 14 mL/min — ABNORMAL LOW (ref >60)
Glucose, Bld: 122 mg/dL — ABNORMAL HIGH (ref 70–99)
Glucose, Bld: 95 mg/dL (ref 70–99)
Potassium: >7.5 mmol/L (ref 3.5–5.1)
Potassium: 3.7 mmol/L (ref 3.5–5.1)
Sodium: 134 mmol/L — ABNORMAL LOW (ref 135–145)
Sodium: 135 mmol/L (ref 135–145)
Total Bilirubin: 0.5 mg/dL (ref 0.3–1.2)
Total Bilirubin: 0.9 mg/dL (ref 0.3–1.2)
Total Protein: 7.2 g/dL (ref 6.5–8.1)
Total Protein: 5.2 g/dL — ABNORMAL LOW (ref 6.5–8.1)

## 2020-05-09 LAB — CBG MONITORING, ED: Glucose-Capillary: 234 mg/dL — ABNORMAL HIGH (ref 70–99)

## 2020-05-09 LAB — TROPONIN I (HIGH SENSITIVITY)
Troponin I (High Sensitivity): 9 ng/L (ref <18)
Troponin I (High Sensitivity): 9 ng/L (ref <18)

## 2020-05-09 LAB — VITAMIN B12: Vitamin B-12: 165 pg/mL — ABNORMAL LOW (ref 180–914)

## 2020-05-09 LAB — LACTIC ACID, PLASMA
Lactic Acid, Venous: 1.1 mmol/L (ref 0.5–1.9)
Lactic Acid, Venous: 1.4 mmol/L (ref 0.5–1.9)

## 2020-05-09 LAB — CBC WITH DIFFERENTIAL/PLATELET
Abs Immature Granulocytes: 0.06 10*3/uL (ref 0.00–0.07)
Basophils Absolute: 0 10*3/uL (ref 0.0–0.1)
Basophils Relative: 0 %
Eosinophils Absolute: 0.1 10*3/uL (ref 0.0–0.5)
Eosinophils Relative: 1 %
HCT: 37.9 % — ABNORMAL LOW (ref 39.0–52.0)
Hemoglobin: 12.4 g/dL — ABNORMAL LOW (ref 13.0–17.0)
Immature Granulocytes: 1 %
Lymphocytes Relative: 8 %
Lymphs Abs: 1.1 10*3/uL (ref 0.7–4.0)
MCH: 33.2 pg (ref 26.0–34.0)
MCHC: 32.7 g/dL (ref 30.0–36.0)
MCV: 101.3 fL — ABNORMAL HIGH (ref 80.0–100.0)
Monocytes Absolute: 0.9 10*3/uL (ref 0.1–1.0)
Monocytes Relative: 7 %
Neutro Abs: 11.2 10*3/uL — ABNORMAL HIGH (ref 1.7–7.7)
Neutrophils Relative %: 83 %
Platelets: 281 10*3/uL (ref 150–400)
RBC: 3.74 MIL/uL — ABNORMAL LOW (ref 4.22–5.81)
RDW: 13.4 % (ref 11.5–15.5)
WBC: 13.3 10*3/uL — ABNORMAL HIGH (ref 4.0–10.5)
nRBC: 0 % (ref 0.0–0.2)

## 2020-05-09 LAB — CBC
HCT: 28.6 % — ABNORMAL LOW (ref 39.0–52.0)
Hemoglobin: 9.4 g/dL — ABNORMAL LOW (ref 13.0–17.0)
MCH: 32.8 pg (ref 26.0–34.0)
MCHC: 32.9 g/dL (ref 30.0–36.0)
MCV: 99.7 fL (ref 80.0–100.0)
Platelets: 211 10*3/uL (ref 150–400)
RBC: 2.87 MIL/uL — ABNORMAL LOW (ref 4.22–5.81)
RDW: 13.2 % (ref 11.5–15.5)
WBC: 7.2 10*3/uL (ref 4.0–10.5)
nRBC: 0 % (ref 0.0–0.2)

## 2020-05-09 LAB — BASIC METABOLIC PANEL
Anion gap: 11 (ref 5–15)
BUN: 120 mg/dL — ABNORMAL HIGH (ref 8–23)
CO2: 14 mmol/L — ABNORMAL LOW (ref 22–32)
Calcium: 8.4 mg/dL — ABNORMAL LOW (ref 8.9–10.3)
Chloride: 110 mmol/L (ref 98–111)
Creatinine, Ser: 8.77 mg/dL — ABNORMAL HIGH (ref 0.61–1.24)
GFR calc Af Amer: 7 mL/min — ABNORMAL LOW (ref >60)
GFR calc non Af Amer: 6 mL/min — ABNORMAL LOW (ref >60)
Glucose, Bld: 93 mg/dL (ref 70–99)
Potassium: 6.2 mmol/L — ABNORMAL HIGH (ref 3.5–5.1)
Sodium: 135 mmol/L (ref 135–145)

## 2020-05-09 LAB — MRSA PCR SCREENING: MRSA by PCR: NEGATIVE

## 2020-05-09 LAB — AMMONIA: Ammonia: 9 umol/L (ref 9–35)

## 2020-05-09 LAB — MAGNESIUM: Magnesium: 1.5 mg/dL — ABNORMAL LOW (ref 1.7–2.4)

## 2020-05-09 LAB — PROTIME-INR
INR: 1 (ref 0.8–1.2)
Prothrombin Time: 13.2 seconds (ref 11.4–15.2)

## 2020-05-09 LAB — RAPID URINE DRUG SCREEN, HOSP PERFORMED
Amphetamines: NOT DETECTED
Barbiturates: NOT DETECTED
Benzodiazepines: NOT DETECTED
Cocaine: NOT DETECTED
Opiates: NOT DETECTED
Tetrahydrocannabinol: NOT DETECTED

## 2020-05-09 LAB — ETHANOL: Alcohol, Ethyl (B): <10 mg/dL (ref <10)

## 2020-05-09 LAB — PHOSPHORUS: Phosphorus: 2.6 mg/dL (ref 2.5–4.6)

## 2020-05-09 LAB — SARS CORONAVIRUS 2 BY RT PCR (HOSPITAL ORDER, PERFORMED IN ~~LOC~~ HOSPITAL LAB): SARS Coronavirus 2: NEGATIVE

## 2020-05-09 LAB — CREATININE, URINE, RANDOM: Creatinine, Urine: 165.93 mg/dL

## 2020-05-09 LAB — SODIUM, URINE, RANDOM: Sodium, Ur: 60 mmol/L

## 2020-05-09 LAB — TSH: TSH: 0.778 u[IU]/mL (ref 0.350–4.500)

## 2020-05-09 LAB — APTT: aPTT: 27 seconds (ref 24–36)

## 2020-05-09 MED ORDER — CHLORHEXIDINE GLUCONATE CLOTH 2 % EX PADS
6.0000 | MEDICATED_PAD | Freq: Every day | CUTANEOUS | Status: DC
  Administered 2020-05-10 – 2020-05-12 (×2): 6 via TOPICAL

## 2020-05-09 MED ORDER — ALTEPLASE 2 MG IJ SOLR: 2.0000 mg | Freq: Once | INTRAMUSCULAR | Status: DC | PRN

## 2020-05-09 MED ORDER — CALCIUM GLUCONATE 10 % IV SOLN
1.0000 g | Freq: Once | INTRAVENOUS | Status: AC
  Administered 2020-05-09: 1 g via INTRAVENOUS
  Filled 2020-05-09: qty 10

## 2020-05-09 MED ORDER — ONDANSETRON HCL 4 MG PO TABS: 4.0000 mg | ORAL_TABLET | Freq: Four times a day (QID) | ORAL | Status: DC | PRN

## 2020-05-09 MED ORDER — ONDANSETRON HCL 4 MG/2ML IJ SOLN: 4.0000 mg | Freq: Four times a day (QID) | INTRAMUSCULAR | Status: DC | PRN

## 2020-05-09 MED ORDER — SODIUM BICARBONATE 8.4 % IV SOLN
50.0000 meq | Freq: Once | INTRAVENOUS | Status: AC
  Administered 2020-05-09: 50 meq via INTRAVENOUS

## 2020-05-09 MED ORDER — ADULT MULTIVITAMIN W/MINERALS CH
1.0000 | ORAL_TABLET | Freq: Every day | ORAL | Status: DC
  Administered 2020-05-11 – 2020-05-12 (×2): 1 via ORAL
  Filled 2020-05-09 (×2): qty 1

## 2020-05-09 MED ORDER — LORAZEPAM 2 MG/ML IJ SOLN
1.0000 mg | INTRAMUSCULAR | Status: DC | PRN
  Administered 2020-05-10: 2 mg via INTRAVENOUS
  Administered 2020-05-10: 1 mg via INTRAVENOUS
  Filled 2020-05-09 (×2): qty 1

## 2020-05-09 MED ORDER — VANCOMYCIN HCL IN DEXTROSE 1-5 GM/200ML-% IV SOLN: 1000.0000 mg | Freq: Once | INTRAVENOUS | Status: DC

## 2020-05-09 MED ORDER — STERILE WATER FOR INJECTION IV SOLN
INTRAVENOUS | Status: DC
  Filled 2020-05-09 (×9): qty 850

## 2020-05-09 MED ORDER — INSULIN ASPART 100 UNIT/ML IV SOLN
10.0000 [IU] | Freq: Once | INTRAVENOUS | Status: AC
  Administered 2020-05-09: 10 [IU] via INTRAVENOUS

## 2020-05-09 MED ORDER — NICOTINE 21 MG/24HR TD PT24
21.0000 mg | MEDICATED_PATCH | Freq: Every day | TRANSDERMAL | Status: DC
  Administered 2020-05-09 – 2020-05-12 (×4): 21 mg via TRANSDERMAL
  Filled 2020-05-09 (×4): qty 1

## 2020-05-09 MED ORDER — LORAZEPAM 1 MG PO TABS: 1.0000 mg | ORAL_TABLET | ORAL | Status: DC | PRN

## 2020-05-09 MED ORDER — HEPARIN SODIUM (PORCINE) 1000 UNIT/ML DIALYSIS: 1000.0000 [IU] | INTRAMUSCULAR | Status: DC | PRN

## 2020-05-09 MED ORDER — ASPIRIN EC 81 MG PO TBEC
81.0000 mg | DELAYED_RELEASE_TABLET | Freq: Every day | ORAL | Status: DC
  Administered 2020-05-11 – 2020-05-12 (×2): 81 mg via ORAL
  Filled 2020-05-09 (×2): qty 1

## 2020-05-09 MED ORDER — HALOPERIDOL LACTATE 5 MG/ML IJ SOLN
1.0000 mg | INTRAMUSCULAR | Status: DC | PRN
  Administered 2020-05-09 – 2020-05-11 (×2): 1 mg via INTRAVENOUS
  Filled 2020-05-09 (×2): qty 1

## 2020-05-09 MED ORDER — LORAZEPAM 2 MG/ML IJ SOLN
0.0000 mg | Freq: Four times a day (QID) | INTRAMUSCULAR | Status: AC
  Administered 2020-05-09 – 2020-05-10 (×2): 2 mg via INTRAVENOUS
  Filled 2020-05-09: qty 1
  Filled 2020-05-09: qty 2

## 2020-05-09 MED ORDER — SODIUM BICARBONATE 8.4 % IV SOLN
INTRAVENOUS | Status: AC
  Filled 2020-05-09: qty 50

## 2020-05-09 MED ORDER — DEXTROSE 50 % IV SOLN
1.0000 | Freq: Once | INTRAVENOUS | Status: AC
  Administered 2020-05-09: 50 mL via INTRAVENOUS
  Filled 2020-05-09: qty 50

## 2020-05-09 MED ORDER — LORAZEPAM 2 MG/ML IJ SOLN: 0.0000 mg | Freq: Two times a day (BID) | INTRAMUSCULAR | Status: DC

## 2020-05-09 MED ORDER — SODIUM CHLORIDE 0.9 % IV SOLN: 100.0000 mL | INTRAVENOUS | Status: DC | PRN

## 2020-05-09 MED ORDER — VANCOMYCIN HCL 1500 MG/300ML IV SOLN
1500.0000 mg | Freq: Once | INTRAVENOUS | Status: AC
  Administered 2020-05-09: 1500 mg via INTRAVENOUS
  Filled 2020-05-09: qty 300

## 2020-05-09 MED ORDER — SODIUM CHLORIDE 0.9 % IV BOLUS
2000.0000 mL | Freq: Once | INTRAVENOUS | Status: AC
  Administered 2020-05-09: 2000 mL via INTRAVENOUS

## 2020-05-09 MED ORDER — FOLIC ACID 1 MG PO TABS
1.0000 mg | ORAL_TABLET | Freq: Every day | ORAL | Status: DC
  Administered 2020-05-11 – 2020-05-12 (×2): 1 mg via ORAL
  Filled 2020-05-09 (×2): qty 1

## 2020-05-09 MED ORDER — THIAMINE HCL 100 MG PO TABS
100.0000 mg | ORAL_TABLET | Freq: Every day | ORAL | Status: DC
  Administered 2020-05-11 – 2020-05-12 (×2): 100 mg via ORAL
  Filled 2020-05-09 (×2): qty 1

## 2020-05-09 MED ORDER — ATORVASTATIN CALCIUM 40 MG PO TABS
40.0000 mg | ORAL_TABLET | Freq: Every day | ORAL | Status: DC
  Administered 2020-05-10 – 2020-05-11 (×2): 40 mg via ORAL
  Filled 2020-05-09 (×2): qty 1

## 2020-05-09 MED ORDER — PIPERACILLIN-TAZOBACTAM 3.375 G IVPB 30 MIN
3.3750 g | Freq: Once | INTRAVENOUS | Status: AC
  Administered 2020-05-09: 3.375 g via INTRAVENOUS
  Filled 2020-05-09: qty 50

## 2020-05-09 MED ORDER — THIAMINE HCL 100 MG/ML IJ SOLN
100.0000 mg | Freq: Every day | INTRAMUSCULAR | Status: DC
  Administered 2020-05-09 – 2020-05-10 (×2): 100 mg via INTRAVENOUS
  Filled 2020-05-09 (×2): qty 2

## 2020-05-09 NOTE — ED Triage Notes (Signed)
Patient arrives to ED via [facility] from his home.  Family reports patient seemed altered at 0700 today.  Family reports confusion and combativeness.  Last seen normal was 2130 last night.  Patietn is usually up walking and talking to family.

## 2020-05-09 NOTE — Procedures (Signed)
Central Venous Catheter Insertion Procedure Note  MOODY ROBBEN  [id number]  27-Jul-1961  Date:05/09/20  Time:4:12 PM   Provider Performing:Sanay Belmar V. Elsworth Soho   Procedure: Insertion of Non-tunneled Central Venous Catheter(36556)with US guidance (68032)    Indication(s) Hemodialysis  Consent Risks of the procedure as well as the alternatives and risks of each were explained to the patient and/or caregiver.  Consent for the procedure was obtained and is signed in the bedside chart  Anesthesia Topical only with 1% lidocaine   Timeout Verified patient identification, verified procedure, site/side was marked, verified correct patient position, special equipment/implants available, medications/allergies/relevant history reviewed, required imaging and test results available.  Sterile Technique Maximal sterile technique including full sterile barrier drape, hand hygiene, sterile gown, sterile gloves, mask, hair covering, sterile ultrasound probe cover (if used).  Procedure Description Area of catheter insertion was cleaned with chlorhexidine and draped in sterile fashion.   With real-time ultrasound guidance a HD catheter was placed into the right internal jugular vein.  Nonpulsatile blood flow and easy flushing noted in all ports.  The catheter was sutured in place and sterile dressing applied.  Complications/Tolerance None; patient tolerated the procedure well. Chest X-ray is ordered to verify placement for internal jugular cannulation.    EBL Minimal  Specimen(s) None   Bascom Biel V. Elsworth Soho MD

## 2020-05-09 NOTE — ED Provider Notes (Signed)
[facility] EMERGENCY DEPARTMENT Provider Note   CSN: [id number] Arrival date & time: 05/09/20  1054     History Chief Complaint  Patient presents with  . Altered Mental Status    [name] is a 59 y.o. male.  Patient with altered mental status.  Patient awake but confused oriented only to person  The history is provided by the EMS personnel. No language interpreter was used.  Altered Mental Status Presenting symptoms: disorientation   Severity:  Severe Most recent episode:  Today Episode history:  Unable to specify Timing:  Constant Progression:  Unchanged Chronicity:  New Context: alcohol use   Associated symptoms: no abdominal pain        Past Medical History:  Diagnosis Date  . Alcoholism ([place])    Continues to abuse ETOH as of 01/2017; pt has poor insight into his problem.  Pt states that as of 04/2017 he has been 3 monthds sober.  . Anxiety and depression   . Arthritis    "hands, legs" (12/23/2017)  . Chronic joint pain   . COPD (chronic obstructive pulmonary disease) ([place])   . Depression   . Frequent headaches    "depends on BP" (12/23/2017)  . History of kidney stones   . Hypertension   . Insomnia 08/26/2017   08/23/17 ED visit for adverse rxn to taking ambien, PLUS + marijuana on UDS her and in ED---NO FURTHER AMBIEN OR ANY OTHER CONTROLLED SUBSTANCES WILL BE PRESCRIBED BY ME--PM  . Pneumothorax, right   . Polysubstance abuse ([place])   . Poor historian    UNRELIABLE HISTORIAN  . 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  Baclofen oral no help.  Botox inj started by Dr. Posey Pronto 07/2018--no help x 1.  Inj #2 done 10/2018.  Marland Kitchen Stroke ([place]) 2012   LUE residual deficit: flexion contracture and weakness of L hand.  Old bilateral cerebral infarcts involving the right frontal and parietal lobes as well as the left occipital lobe.  Baclofen and botox via neuro for L arm contracture.  . Stroke [facility])    "3 mini  strokes since 2012" (12/23/2017)  . Subdural hematoma ([place]) 01/29/2017   Subacute right-sided frontal subdural hematoma: found on CT in [place] ED, transferred to [facility] where this was non-operatively managed.  Pt admitted to the MDs at [facility] that he still drinks lots of whisky daily and they surmised that his frequent falls of late are due to this and frequent use of OTC sleep aids.  Near complete resolution on CT 03/27/18.  . Venous insufficiency of both lower extremities     Patient Active Problem List   Diagnosis Date Noted  . ARF (acute renal failure) ([place]) 05/09/2020  . Orthostatic hypotension   . Multiple falls   . History of CVA with residual deficit   . Syncope 09/23/2019  . AKI (acute kidney injury) ([place])   . Weakness   . Unsteady gait   . History of ischemic stroke   . Hypomagnesemia   . Hypokalemia   . Wernicke's disease   . Essential hypertension   . Tobacco abuse counseling   . Subdural hematoma ([place]) 12/22/2017  . Depression   . Tobacco abuse 11/08/2008  . C O P D 11/08/2008  . Pneumothorax 10/19/2008  . PNEUMOTHORAX 10/19/2008    Past Surgical History:  Procedure Laterality Date  . CHEST TUBE INSERTION    . LUNG SURGERY    .  SHOULDER OPEN ROTATOR CUFF REPAIR Bilateral    Multiple rotator cuff surgeries on both sides.  . TONSILLECTOMY    . TRANSTHORACIC ECHOCARDIOGRAM  09/24/2019   EF 55-60%, grd I DD.  Marland Kitchen TYMPANOPLASTY Right   . US CAROTIDS  09/24/2019   Minimal atherosclerotic plaque bilat; no flow obstruction  . VIDEO ASSISTED THORACOSCOPY (VATS) W/TALC PLEUADESIS  2010 X2       Family History  Problem Relation Age of Onset  . Arthritis Mother   . Heart disease Father   . Hypertension Father   . Breast cancer Maternal Aunt   . Alcohol abuse Maternal Uncle   . Lung cancer Maternal Aunt   . Cancer Brother        bone    Social History   Tobacco Use  . Smoking status: Current Every Day Smoker    Packs/day: 1.00    Years: 44.00    Pack years:  44.00    Types: Cigarettes  . Smokeless tobacco: Never Used  Vaping Use  . Vaping Use: Former  Substance Use Topics  . Alcohol use: No    Comment: Quit summer 2018. (12/23/2017)  . Drug use: Yes    Frequency: 2.0 times per week    Types: Marijuana    Comment: 12/23/2017 "no more than once/day; ave 3-4 days/wk"    Home Medications Prior to Admission medications   Medication Sig Start Date End Date Taking? Authorizing Provider  aspirin EC 81 MG EC tablet Take 1 tablet (81 mg total) by mouth daily. 09/26/19  Yes Loletha Grayer, MD  atorvastatin (LIPITOR) 40 MG tablet Take 1 tablet (40 mg total) by mouth daily at 6 PM. 12/10/19  Yes McGowen, Adrian Blackwater, MD  chlorthalidone (HYGROTON) 25 MG tablet Take 1 tablet (25 mg total) by mouth daily. 04/21/20  Yes McGowen, Adrian Blackwater, MD  felodipine (PLENDIL) 10 MG 24 hr tablet Take 1 tablet (10 mg total) by mouth daily. 03/24/20  Yes McGowen, Adrian Blackwater, MD  lisinopril (ZESTRIL) 40 MG tablet Take 1 tablet (40 mg total) by mouth in the morning and at bedtime. 01/21/20  Yes McGowen, Adrian Blackwater, MD  mirtazapine (REMERON) 15 MG tablet 1 tab po qhs x 15d, then increase to 2 tabs po qhs 04/21/20  Yes McGowen, Adrian Blackwater, MD  potassium chloride SA (KLOR-CON) 20 MEQ tablet Take 1 tablet (20 mEq total) by mouth 2 (two) times daily. 12/13/19  Yes McGowen, Adrian Blackwater, MD  QUEtiapine (SEROQUEL) 400 MG tablet 1 tab po qhs 02/16/20  Yes McGowen, Adrian Blackwater, MD  thiamine (VITAMIN B-1) 100 MG tablet Take 1 tablet (100 mg total) by mouth daily. 09/25/19  Yes Wieting, Richard, MD  traZODone (DESYREL) 100 MG tablet Take 2 tablets (200 mg total) by mouth at bedtime. 12/10/19  Yes McGowen, Adrian Blackwater, MD  nicotine (NICODERM CQ - DOSED IN MG/24 HOURS) 21 mg/24hr patch One 21mg  patch chest wall daily (may substitute generic) Patient not taking: Reported on 03/24/2020 09/25/19   Loletha Grayer, MD    Allergies    Ambien [zolpidem]  Review of Systems   Review of Systems  Unable to perform  ROS: Mental status change  Gastrointestinal: Negative for abdominal pain.    Physical Exam Updated Vital Signs BP (!) 86/67   Pulse 89   Temp (!) 96.7 F (35.9 C) (Rectal)   Resp 14   Ht 5\' 7"  (1.702 m)   Wt 79 kg   SpO2 100%   BMI 27.28 kg/m  Physical Exam Constitutional:      Appearance: He is well-developed.     Comments: Patient alert but confused  HENT:     Head: Normocephalic.     Nose: Nose normal.  Eyes:     General: No scleral icterus.    Conjunctiva/sclera: Conjunctivae normal.  Neck:     Thyroid: No thyromegaly.  Cardiovascular:     Rate and Rhythm: Normal rate and regular rhythm.     Heart sounds: No murmur heard.  No friction rub. No gallop.   Pulmonary:     Breath sounds: No stridor. No wheezing or rales.  Chest:     Chest wall: No tenderness.  Abdominal:     General: There is no distension.     Tenderness: There is no abdominal tenderness. There is no rebound.  Musculoskeletal:        General: Normal range of motion.     Cervical back: Neck supple.  Lymphadenopathy:     Cervical: No cervical adenopathy.  Skin:    Findings: No erythema or rash.  Neurological:     Motor: No abnormal muscle tone.     Coordination: Coordination normal.     Comments: Oriented to person only     ED Results / Procedures / Treatments   Labs (all labs ordered are listed, but only abnormal results are displayed) Labs Reviewed  COMPREHENSIVE METABOLIC PANEL - Abnormal; Notable for the following components:      Result Value   Sodium 134 (*)    Potassium >7.5 (*)    CO2 14 (*)    Glucose, Bld 122 (*)    BUN 124 (*)    Creatinine, Ser 9.66 (*)    AST 14 (*)    GFR calc non Af Amer 5 (*)    GFR calc Af Amer 6 (*)    All other components within normal limits  CBC WITH DIFFERENTIAL/PLATELET - Abnormal; Notable for the following components:   WBC 13.3 (*)    RBC 3.74 (*)    Hemoglobin 12.4 (*)    HCT 37.9 (*)    MCV 101.3 (*)    Neutro Abs 11.2 (*)    All  other components within normal limits  URINALYSIS, ROUTINE W REFLEX MICROSCOPIC - Abnormal; Notable for the following components:   Hgb urine dipstick SMALL (*)    Bacteria, UA RARE (*)    All other components within normal limits  CBG MONITORING, ED - Abnormal; Notable for the following components:   Glucose-Capillary 234 (*)    All other components within normal limits  CULTURE, BLOOD (ROUTINE X 2)  CULTURE, BLOOD (ROUTINE X 2)  URINE CULTURE  LACTIC ACID, PLASMA  LACTIC ACID, PLASMA  APTT  PROTIME-INR  AMMONIA  ETHANOL  OCCULT BLOOD X 1 CARD TO LAB, STOOL  DRUG PROFILE, UR, 9 DRUGS ([facility])  RAPID URINE DRUG SCREEN, HOSP PERFORMED  BASIC METABOLIC PANEL  SODIUM, URINE, RANDOM  CREATININE, URINE, RANDOM  ETHYLENE GLYCOL  VOLATILES,BLD-ACETONE,ETHANOL,ISOPROP,METHANOL  TROPONIN I (HIGH SENSITIVITY)  TROPONIN I (HIGH SENSITIVITY)    EKG EKG Interpretation  Date/Time:  Tuesday May 09 2020 11:07:32 EDT Ventricular Rate:  81 PR Interval:    QRS Duration: 119 QT Interval:  358 QTC Calculation: 416 R Axis:   45 Text Interpretation: Sinus rhythm Nonspecific intraventricular conduction delay Confirmed by Milton Ferguson [phone]) on 05/09/2020 12:37:02 PM   Radiology DG Chest Port 1 View  Result Date: 05/09/2020 CLINICAL DATA:  Confusion, weakness EXAM: PORTABLE CHEST 1  VIEW COMPARISON:  09/23/2019 FINDINGS: The heart size and mediastinal contours are within normal limits. No focal airspace consolidation, pleural effusion, or pneumothorax. The visualized skeletal structures are unremarkable. IMPRESSION: No active disease. Electronically Signed   By: Davina Poke D.O.   On: 05/09/2020 11:44    Procedures Procedures (including critical care time)  Medications Ordered in ED Medications  vancomycin (VANCOREADY) IVPB 1500 mg/300 mL (1,500 mg Intravenous New Bag/Given 05/09/20 1322)  sodium bicarbonate 1 mEq/mL injection (has no administration in time range)  Chlorhexidine  Gluconate Cloth 2 % PADS 6 each (has no administration in time range)  sodium bicarbonate 150 mEq in sterile water 1,000 mL infusion (has no administration in time range)  piperacillin-tazobactam (ZOSYN) IVPB 3.375 g (3.375 g Intravenous New Bag/Given 05/09/20 1220)  calcium gluconate inj 10% (1 g) URGENT USE ONLY! (1 g Intravenous Given 05/09/20 1253)  sodium bicarbonate injection 50 mEq (50 mEq Intravenous Given 05/09/20 1252)  insulin aspart (novoLOG) injection 10 Units (10 Units Intravenous Given 05/09/20 1253)    And  dextrose 50 % solution 50 mL (50 mLs Intravenous Given 05/09/20 1252)  sodium chloride 0.9 % bolus 2,000 mL (2,000 mLs Intravenous New Bag/Given 05/09/20 1253)   Patient was hypotensive originally patient was treated with fluids and was found to be hyperkalemic also.  With renal failure.  Nephrology and critical care consulted. ED Course  I have reviewed the triage vital signs and the nursing notes.  Pertinent labs & imaging results that were available during my care of the patient were reviewed by me and considered in my medical decision making (see chart for details). CRITICAL CARE Performed by: Milton Ferguson Total critical care time: 45 minutes Critical care time was exclusive of separately billable procedures and treating other patients. Critical care was necessary to treat or prevent imminent or life-threatening deterioration. Critical care was time spent personally by me on the following activities: development of treatment plan with patient and/or surrogate as well as nursing, discussions with consultants, evaluation of patient's response to treatment, examination of patient, obtaining history from patient or surrogate, ordering and performing treatments and interventions, ordering and review of laboratory studies, ordering and review of radiographic studies, pulse oximetry and re-evaluation of patient's condition. Pt was seen by critical care and nephrology   MDM  Rules/Calculators/A&P                        Patient with renal failure and hyperkalemia he will need acute dialysis       This patient presents to the ED for concern of altered mental status, this involves an extensive number of treatment options, and is a complaint that carries with it a high risk of complications and morbidity.  The differential diagnosis includes sepsis stroke   Lab Tests:   I Ordered, reviewed, and interpreted labs, which included CBC chemistries which showed mild elevated white count with renal failure with creatinine close to 10  Medicines ordered:   I ordered medication normal saline for dehydration  Imaging Studies ordered:   I ordered imaging studies which included chest x-ray and  I independently visualized and interpreted imaging which showed unremarkable  Additional history obtained:   Additional history obtained from EMS  Previous records obtained and reviewed.  Consultations Obtained:   I consulted nephrology and critical care along with hospitalist and discussed lab and imaging findings  Reevaluation:  After the interventions stated above, I reevaluated the patient and found mild improvement  Critical  Interventions:  . Acute treatment of hypotension with normal saline and antibiotics along with treatment of hyperkalemia with calcium insulin glucose and bicarb   Final Clinical Impression(s) / ED Diagnoses Final diagnoses:  ARF (acute renal failure) ([place])    Rx / DC Orders ED Discharge Orders    None       Milton Ferguson, MD 05/10/20 [phone]

## 2020-05-09 NOTE — ED Notes (Signed)
Date and time results received: 05/09/20 1334 (use smartphrase ".now" to insert current time)  Test: Potassium Critical Value: Greater than 7.5  Name of Provider Notified: Dr. Roderic Palau  Orders Received? Or Actions Taken?:

## 2020-05-09 NOTE — Consult Note (Signed)
NAME:  [name], MRN:  [id number], DOB:  09-24-1961, LOS: 0 ADMISSION DATE:  05/09/2020, CONSULTATION DATE:  05/09/2020  REFERRING MD:  Roderic Palau, EDP, CHIEF COMPLAINT:  Confusion, renal failure   Brief History   59 year old man with history of depression, old CVA and left hemiparesis presents with confusion, AKI and hyperkalemia  History of present illness   Patient was confused and history is obtained after review of chart, speaking to ED physician and daughter [name].  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  He has failed fluoxetine and Cymbalta in the past.  Per daughter he stopped this medication since it did not work for him Was also started on chlorthalidone EMS was called this morning due to confusion and combativeness since 0700, last seen normal was 2130 last night, initial vital signs showed him to be hypotensive, hypoxic 87% on room air.  Labs showed AKI with BUN/creatinine of 124/9.6 and potassium of 8.1 with mild leukocytosis, alcohol level less than 10  Home med list includes lisinopril, chlorthalidone and potassium supplements.  He admits to taking ibuprofen for back pain.  He denies alcohol or any other drug intake.  He denies any attempt to harm himself He was given insulin/D50, IV bicarbonate and calcium for hyperkalemia.  PCCM consulted while awaiting nephrology input  Past Medical History  CVA with left hemiparesis Major depression Hypertension  Significant Hospital Events   7/13 admit  Consults:  Nephrology  Procedures:  7/13 right IJ  Significant Diagnostic Tests:  Ultrasound renal 7/13>>  Micro Data:  Blood 7/13 Urine 7/13  Antimicrobials:  Zosyn Vancomycin  Interim history/subjective:    Objective   Blood pressure (!) 86/67, pulse 89, temperature (!) 96.7  F (35.9 C), temperature source Rectal, resp. rate 14, height 5\' 7"  (1.702 m), weight 79 kg, SpO2 100 %.       No intake or output data in the 24 hours ending 05/09/20 1614 Filed Weights   05/09/20 1121  Weight: 79 kg    Examination: General: Chronically ill-appearing man, mild distress, restless confused HENT: No pallor, no icterus, no JVD Lungs: No accessory muscle use, clear breath sounds bilateral Cardiovascular: S1-S2 regular, no rub Abdomen: Soft, nontender Extremities: No edema, no deformity Neuro: Was able to tell me his name, address, name of his kids, confused to place, able to hold conversation but intermittently trails off, kept calling RN by his daughter's name , left hemiparesis and flexion contracture with tremors GU: Foley, clear urine  EKG personally reviewed which shows intraventricular conduction delay, peaked T waves in V3 and V4 Chest x-ray shows clear lungs  Resolved Hospital Problem list     Assessment & Plan:  Acute encephalopathy in the setting of AKI with hyperkalemia, home meds include lisinopril and potassium.  He was taking NSAIDs, appears dehydrated on exam, I have to rule out sepsis and acute stroke  AKI with hyperkalemia -treated with cocktail including bicarbonate, calcium and D50/insulin in the ED -Discussed with nephrology, HD catheter placed with plan for emergent dialysis -Repeat bmet -Bicarbonate drip at 125/hour Received 2 L of fluid  -No evidence or history of toxin ingestion but obtain UDS and basic toxicology  Acute encephalopathy -likely related to above -Head CT to rule out acute CVA, prior chronic infarcts noted on MRI from 08/2019 -Haldol as  needed for delirium  Hypotension -not clear if this is real, difficult to obtain cuff blood pressure due to tremors, blood pressure seems to fluctuate between normal high and low, -Empirically cover with antibiotics for sepsis but no source apparent -Obtain procalcitonin for  completion   Best practice:  Diet: NPO Pain/Anxiety/Delirium protocol (if indicated): Haldol as needed, VAP protocol (if indicated): N/A DVT prophylaxis: Subcu heparin GI prophylaxis: N/A Glucose control: N/A Mobility: Bedrest Code Status: Full Family Communication: Updated daughter [name] Disposition: ICU  Labs   CBC: Recent Labs  Lab 05/09/20 1138  WBC 13.3*  NEUTROABS 11.2*  HGB 12.4*  HCT 37.9*  MCV 101.3*  PLT 219    Basic Metabolic Panel: Recent Labs  Lab 05/09/20 1138 05/09/20 1427  NA 134* 135  K >7.5* 6.2*  CL 105 110  CO2 14* 14*  GLUCOSE 122* 93  BUN 124* 120*  CREATININE 9.66* 8.77*  CALCIUM 9.6 8.4*   GFR: Estimated Creatinine Clearance: 8.3 mL/min (A) (by C-G formula based on SCr of 8.77 mg/dL (H)). Recent Labs  Lab 05/09/20 1138 05/09/20 1427  WBC 13.3*  --   LATICACIDVEN 1.1 1.4    Liver Function Tests: Recent Labs  Lab 05/09/20 1138  AST 14*  ALT 11  ALKPHOS 101  BILITOT 0.5  PROT 7.2  ALBUMIN 3.7   No results for input(s): LIPASE, AMYLASE in the last 168 hours. Recent Labs  Lab 05/09/20 1139  AMMONIA 9    ABG No results found for: PHART, PCO2ART, PO2ART, HCO3, TCO2, ACIDBASEDEF, O2SAT   Coagulation Profile: Recent Labs  Lab 05/09/20 1138  INR 1.0    Cardiac Enzymes: No results for input(s): CKTOTAL, CKMB, CKMBINDEX, TROPONINI in the last 168 hours.  HbA1C: Hgb A1c MFr Bld  Date/Time Value Ref Range Status  11/02/2010 11:50 PM (H) <5.7 % Final   5.8 (NOTE)                                                                       According to the ADA Clinical Practice Recommendations for 2011, when HbA1c is used as a screening test:   >=6.5%   Diagnostic of Diabetes Mellitus           (if abnormal result  is confirmed)  5.7-6.4%   Increased risk of developing Diabetes Mellitus  References:Diagnosis and Classification of Diabetes Mellitus,Diabetes XJOI,3254,98(YMEBR 1):S62-S69 and Standards of Medical Care in          Diabetes - 2011,Diabetes AXEN,4076,80  (Suppl 1):S11-S61.    CBG: Recent Labs  Lab 05/09/20 1123  GLUCAP 234*    Review of Systems:   Confusion No fever, chills, wt loss No CP, dyspnea Previous CVA with left hemiparesis  Past Medical History  He,  has a past medical history of Alcoholism ([place]), Anxiety and depression, Arthritis, Chronic joint pain, COPD (chronic obstructive pulmonary disease) ([place]), Depression, Frequent headaches, History of kidney stones, Hypertension, Insomnia (08/26/2017), Pneumothorax, right, Polysubstance abuse ([place]), Poor historian, Spastic hemiparesis ([place]), Stroke ([place]) (2012), Stroke [facility]), Subdural hematoma ([place]) (01/29/2017), and Venous insufficiency of both lower extremities.   Surgical History    Past Surgical History:  Procedure Laterality Date  . CHEST TUBE INSERTION    . LUNG SURGERY    .  SHOULDER OPEN ROTATOR CUFF REPAIR Bilateral    Multiple rotator cuff surgeries on both sides.  . TONSILLECTOMY    . TRANSTHORACIC ECHOCARDIOGRAM  09/24/2019   EF 55-60%, grd I DD.  Marland Kitchen TYMPANOPLASTY Right   . US CAROTIDS  09/24/2019   Minimal atherosclerotic plaque bilat; no flow obstruction  . VIDEO ASSISTED THORACOSCOPY (VATS) W/TALC PLEUADESIS  2010 X2     Social History   reports that he has been smoking cigarettes. He has a 44.00 pack-year smoking history. He has never used smokeless tobacco. He reports current drug use. Frequency: 2.00 times per week. Drug: Marijuana. He reports that he does not drink alcohol.   Family History   His family history includes Alcohol abuse in his maternal uncle; Arthritis in his mother; Breast cancer in his maternal aunt; Cancer in his brother; Heart disease in his father; Hypertension in his father; Lung cancer in his maternal aunt.   Allergies Allergies  Allergen Reactions  . Ambien [Zolpidem] Other (See Comments)    Pt took too much, acted out a bad dream/was throwing things, etc--ED visit (07/2017)     Home  Medications  Prior to Admission medications   Medication Sig Start Date End Date Taking? Authorizing Provider  aspirin EC 81 MG EC tablet Take 1 tablet (81 mg total) by mouth daily. 09/26/19  Yes Loletha Grayer, MD  atorvastatin (LIPITOR) 40 MG tablet Take 1 tablet (40 mg total) by mouth daily at 6 PM. 12/10/19  Yes McGowen, Adrian Blackwater, MD  chlorthalidone (HYGROTON) 25 MG tablet Take 1 tablet (25 mg total) by mouth daily. 04/21/20  Yes McGowen, Adrian Blackwater, MD  felodipine (PLENDIL) 10 MG 24 hr tablet Take 1 tablet (10 mg total) by mouth daily. 03/24/20  Yes McGowen, Adrian Blackwater, MD  lisinopril (ZESTRIL) 40 MG tablet Take 1 tablet (40 mg total) by mouth in the morning and at bedtime. 01/21/20  Yes McGowen, Adrian Blackwater, MD  mirtazapine (REMERON) 15 MG tablet 1 tab po qhs x 15d, then increase to 2 tabs po qhs 04/21/20  Yes McGowen, Adrian Blackwater, MD  potassium chloride SA (KLOR-CON) 20 MEQ tablet Take 1 tablet (20 mEq total) by mouth 2 (two) times daily. 12/13/19  Yes McGowen, Adrian Blackwater, MD  QUEtiapine (SEROQUEL) 400 MG tablet 1 tab po qhs 02/16/20  Yes McGowen, Adrian Blackwater, MD  thiamine (VITAMIN B-1) 100 MG tablet Take 1 tablet (100 mg total) by mouth daily. 09/25/19  Yes Wieting, Richard, MD  traZODone (DESYREL) 100 MG tablet Take 2 tablets (200 mg total) by mouth at bedtime. 12/10/19  Yes McGowen, Adrian Blackwater, MD  nicotine (NICODERM CQ - DOSED IN MG/24 HOURS) 21 mg/24hr patch One 21mg  patch chest wall daily (may substitute generic) Patient not taking: Reported on 03/24/2020 09/25/19   Loletha Grayer, MD     Critical care time: [place] MD. [facility]. [place]  If no response to pager , please call [phone]   05/09/2020

## 2020-05-09 NOTE — H&P (Signed)
History and Physical    [name] BTD:[id number] DOB: April 16, 1961 DOA: 05/09/2020  PCP: Tammi Sou, MD   Patient coming from: home  I have personally briefly reviewed patient's old medical records in [place]  Chief Complaint: AMS, general malaise.   HPI: [name] is a 59 y.o. male with medical history significant of with past medical history significant for old ischemic CVA (with left hemiparesis), hypertension, major depression-anxiety, alcoholism, COPD and tobacco abuse; who was brought to the hospital secondary to increased confusion, combativeness and altered mental status. Daughter reported to EDP that he has been struggling with severe depression and has not been taking his psychiatry medications properly at home and has not been eating or drinking adequately. There has not been any concerns of fever, chills, nausea, vomiting, abdominal pain, dysuria, hematuria, melena, hematochezia, new focal deficits, headaches or any other complaints.  Patient was last seen normal on 05/08/2020 around 9:30 pm; patient and family member denies any recent alcohol or other recreational substance use. Despite ongoing depression there has not been concerned for suicidal ideation, hallucinations or any attempt to harm himself.  ED Course: Patient was found with severe hyperkalemia, acute renal failure with a creatinine up to 9.66, severe uremia and metabolic acidosis; chest x-ray without acute infiltrates, urinalysis no suggesting infection in his urine. IV fluids were given, insulin/D50, IV bicarbonate and calcium provided for hyperkalemia; EKG no demonstrating acute electrical abnormalities. Renal service was consulted along with PCCM.    Of note, on admission patient vital signs demonstrated to be hypotensive, hypoxic at 87% on room air, alcohol level less than 10 and mildly elevation in leukocytes.  Review of Systems: Unable to be further review as patient mentation was in pain; so info  as per HPI otherwise all other systems reviewed and are negative.   Past Medical History:  Diagnosis Date  . Alcoholism ([place])    Continues to abuse ETOH as of 01/2017; pt has poor insight into his problem.  Pt states that as of 04/2017 he has been 3 monthds sober.  . Anxiety and depression   . Arthritis    "hands, legs" (12/23/2017)  . Chronic joint pain   . COPD (chronic obstructive pulmonary disease) ([place])   . Depression   . Frequent headaches    "depends on BP" (12/23/2017)  . History of kidney stones   . Hypertension   . Insomnia 08/26/2017   08/23/17 ED visit for adverse rxn to taking ambien, PLUS + marijuana on UDS her and in ED---NO FURTHER AMBIEN OR ANY OTHER CONTROLLED SUBSTANCES WILL BE PRESCRIBED BY ME--PM  . Pneumothorax, right   . Polysubstance abuse ([place])   . Poor historian    UNRELIABLE HISTORIAN  . 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  Baclofen oral no help.  Botox inj started by Dr. Posey Pronto 07/2018--no help x 1.  Inj #2 done 10/2018.  Marland Kitchen Stroke ([place]) 2012   LUE residual deficit: flexion contracture and weakness of L hand.  Old bilateral cerebral infarcts involving the right frontal and parietal lobes as well as the left occipital lobe.  Baclofen and botox via neuro for L arm contracture.  . Stroke [facility])    "3 mini strokes since 2012" (12/23/2017)  . Subdural hematoma ([place]) 01/29/2017   Subacute right-sided frontal subdural hematoma: found on CT in [place] ED, transferred to [facility] where this was non-operatively managed.  Pt admitted to  the MDs at [facility] that he still drinks lots of whisky daily and they surmised that his frequent falls of late are due to this and frequent use of OTC sleep aids.  Near complete resolution on CT 03/27/18.  . Venous insufficiency of both lower extremities     Past Surgical History:  Procedure Laterality Date  . CHEST TUBE INSERTION    . LUNG SURGERY    . SHOULDER OPEN ROTATOR CUFF REPAIR  Bilateral    Multiple rotator cuff surgeries on both sides.  . TONSILLECTOMY    . TRANSTHORACIC ECHOCARDIOGRAM  09/24/2019   EF 55-60%, grd I DD.  Marland Kitchen TYMPANOPLASTY Right   . US CAROTIDS  09/24/2019   Minimal atherosclerotic plaque bilat; no flow obstruction  . VIDEO ASSISTED THORACOSCOPY (VATS) W/TALC PLEUADESIS  2010 X2    Social History  reports that he has been smoking cigarettes. He has a 44.00 pack-year smoking history. He has never used smokeless tobacco. He reports current drug use. Frequency: 2.00 times per week. Drug: Marijuana. He reports that he does not drink alcohol.  Allergies  Allergen Reactions  . Ambien [Zolpidem] Other (See Comments)    Pt took too much, acted out a bad dream/was throwing things, etc--ED visit (07/2017)    Family History  Problem Relation Age of Onset  . Arthritis Mother   . Heart disease Father   . Hypertension Father   . Breast cancer Maternal Aunt   . Alcohol abuse Maternal Uncle   . Lung cancer Maternal Aunt   . Cancer Brother        bone    Prior to Admission medications   Medication Sig Start Date End Date Taking? Authorizing Provider  aspirin EC 81 MG EC tablet Take 1 tablet (81 mg total) by mouth daily. 09/26/19  Yes Loletha Grayer, MD  atorvastatin (LIPITOR) 40 MG tablet Take 1 tablet (40 mg total) by mouth daily at 6 PM. 12/10/19  Yes McGowen, Adrian Blackwater, MD  chlorthalidone (HYGROTON) 25 MG tablet Take 1 tablet (25 mg total) by mouth daily. 04/21/20  Yes McGowen, Adrian Blackwater, MD  felodipine (PLENDIL) 10 MG 24 hr tablet Take 1 tablet (10 mg total) by mouth daily. 03/24/20  Yes McGowen, Adrian Blackwater, MD  lisinopril (ZESTRIL) 40 MG tablet Take 1 tablet (40 mg total) by mouth in the morning and at bedtime. 01/21/20  Yes McGowen, Adrian Blackwater, MD  mirtazapine (REMERON) 15 MG tablet 1 tab po qhs x 15d, then increase to 2 tabs po qhs 04/21/20  Yes McGowen, Adrian Blackwater, MD  potassium chloride SA (KLOR-CON) 20 MEQ tablet Take 1 tablet (20 mEq total) by mouth 2  (two) times daily. 12/13/19  Yes McGowen, Adrian Blackwater, MD  QUEtiapine (SEROQUEL) 400 MG tablet 1 tab po qhs 02/16/20  Yes McGowen, Adrian Blackwater, MD  thiamine (VITAMIN B-1) 100 MG tablet Take 1 tablet (100 mg total) by mouth daily. 09/25/19  Yes Wieting, Richard, MD  traZODone (DESYREL) 100 MG tablet Take 2 tablets (200 mg total) by mouth at bedtime. 12/10/19  Yes McGowen, Adrian Blackwater, MD  nicotine (NICODERM CQ - DOSED IN MG/24 HOURS) 21 mg/24hr patch One 21mg  patch chest wall daily (may substitute generic) Patient not taking: Reported on 03/24/2020 09/25/19   Loletha Grayer, MD    Physical Exam: Vitals:   05/09/20 1800 05/09/20 1815 05/09/20 1830 05/09/20 1845  BP: (!) 103/54 (!) 100/59 (!) 93/41 (!) 112/57  Pulse: 93 93 94 95  Resp: 17 16  14  Temp:      TempSrc:      SpO2:      Weight:      Height:        Constitutional: No major distress, no chest pain, no nausea, no vomiting. Patient is restless and confused. Vitals:   05/09/20 1800 05/09/20 1815 05/09/20 1830 05/09/20 1845  BP: (!) 103/54 (!) 100/59 (!) 93/41 (!) 112/57  Pulse: 93 93 94 95  Resp: 17 16  14   Temp:      TempSrc:      SpO2:      Weight:      Height:       Eyes: PERRL, lids and conjunctivae normal, no icterus, no nystagmus.  ENMT: Mucous membranes are dry. Posterior pharynx clear of any exudate or lesions. Neck: normal, supple, no masses, no thyromegaly, no JVD. Respiratory: clear to auscultation bilaterally, no wheezing, no crackles. Normal respiratory effort. No accessory muscle use.  Cardiovascular: Regular rate and rhythm, no murmurs / rubs / gallops. No extremity edema. 2+ pedal pulses. No carotid bruits.  Abdomen: no tenderness, no masses palpated. No hepatosplenomegaly. Bowel sounds positive.  Musculoskeletal: no clubbing / cyanosis.  Skin: no rashes, no petechiae. Neurologic: CN 2-12 grossly intact. Sensation intact, left hemiparesis flexion contracture. (No new deficits). Psychiatric: impaired judgement and  insight due to AMS.    Labs on Admission: I have personally reviewed following labs and imaging studies  CBC: Recent Labs  Lab 05/09/20 1138  WBC 13.3*  NEUTROABS 11.2*  HGB 12.4*  HCT 37.9*  MCV 101.3*  PLT 409    Basic Metabolic Panel: Recent Labs  Lab 05/09/20 1138 05/09/20 1427  NA 134* 135  K >7.5* 6.2*  CL 105 110  CO2 14* 14*  GLUCOSE 122* 93  BUN 124* 120*  CREATININE 9.66* 8.77*  CALCIUM 9.6 8.4*    GFR: Estimated Creatinine Clearance: 8.3 mL/min (A) (by C-G formula based on SCr of 8.77 mg/dL (H)).  Liver Function Tests: Recent Labs  Lab 05/09/20 1138  AST 14*  ALT 11  ALKPHOS 101  BILITOT 0.5  PROT 7.2  ALBUMIN 3.7    Urine analysis:    Component Value Date/Time   COLORURINE YELLOW 05/09/2020 1134   APPEARANCEUR CLEAR 05/09/2020 1134   LABSPEC 1.015 05/09/2020 1134   PHURINE 5.0 05/09/2020 1134   GLUCOSEU NEGATIVE 05/09/2020 1134   HGBUR SMALL (A) 05/09/2020 1134   BILIRUBINUR NEGATIVE 05/09/2020 1134   KETONESUR NEGATIVE 05/09/2020 1134   PROTEINUR NEGATIVE 05/09/2020 1134   UROBILINOGEN 0.2 03/24/2011 2019   NITRITE NEGATIVE 05/09/2020 1134   LEUKOCYTESUR NEGATIVE 05/09/2020 1134    Radiological Exams on Admission: US RENAL  Result Date: 05/09/2020 CLINICAL DATA:  Acute kidney injury. EXAM: RENAL / URINARY TRACT ULTRASOUND COMPLETE COMPARISON:  Abdominal ultrasound dated November 03, 2010. FINDINGS: Right Kidney: Renal measurements: 11.6 x 5.0 x 5.6 cm = volume: 172 mL . Echogenicity within normal limits. No mass or hydronephrosis visualized. Left Kidney: Renal measurements: 12.1 x 6.3 x 6.4 cm = volume: 256 mL. Echogenicity within normal limits. No mass or hydronephrosis visualized. Bladder: Decompressed by Foley catheter. Other: None. IMPRESSION: 1. Normal renal ultrasound. Electronically Signed   By: Titus Dubin M.D.   On: 05/09/2020 16:10   DG Chest Port 1 View  Result Date: 05/09/2020 CLINICAL DATA:  Confusion, weakness EXAM:  PORTABLE CHEST 1 VIEW COMPARISON:  09/23/2019 FINDINGS: The heart size and mediastinal contours are within normal limits. No focal airspace consolidation, pleural effusion, or  pneumothorax. The visualized skeletal structures are unremarkable. IMPRESSION: No active disease. Electronically Signed   By: Davina Poke D.O.   On: 05/09/2020 11:44   DG Chest Port 1V same Day  Result Date: 05/09/2020 CLINICAL DATA:  Dialysis catheter placement. EXAM: PORTABLE CHEST 1 VIEW COMPARISON:  05/09/2020 at 11:32 a.m. FINDINGS: A right jugular catheter has been placed and terminates over the mid SVC. The cardiomediastinal silhouette is unchanged with normal heart size. No confluent airspace opacity, edema, pleural effusion, pneumothorax is identified. Postsurgical changes are noted in the right lung apex. IMPRESSION: Right jugular catheter placement as above. Electronically Signed   By: Logan Bores M.D.   On: 05/09/2020 16:41    EKG: Independently reviewed. Acute ischemic changes. Sinus rhythm; no ventricular conduction abnormalities, normal QT.  Assessment/Plan 1-acute metabolic encephalopathy -in the setting of uremia and ARF -emergent HD to be performed -will check CT head, TSH, B12 -alcohol level was < 10; but will monitor on CIWA protocol and use folic acid and Thiamine. -minimize oversedatives agents and follow response. -full liquid diet  2-ARF -normal renal function 4 months ago -avoid nephrotoxic agents -emergent HD as recommended by nephrology service -follow renal function response   3-Tobacco abuse -nicotine patch ordered -once mentation is improved will benefit of further cessation counseling  4-COPD (chronic obstructive pulmonary disease) (HCC) -no SOB and no wheezing currently -will use PRN bronchodilators  5-History of ischemic stroke -no acute deficits appreciated -CT head to rule out any CVA playing a role in new AMS -continue risk factors modification. -continue ASA and  statins  6-Essential hypertension -soft to low -follow VS -started on IVF's -holding antihypertensive agents currently    DVT prophylaxis: Heparin  Code Status:   Full code Family Communication:  No family at bedside. Disposition Plan:   Patient is from:  Home.  Anticipated DC to:  To be determined.  Anticipated DC date:  To be determined.  Anticipated DC barriers: Stabilization of patient encephalopathy, renal function and electrolytes.  Consults called:  PCCM (Dr. Elsworth Soho), nephrology service (Dr.:). Admission status:  Stepdown, inpatient status, length of stay more than 2 midnights.  Severity of Illness: Severe illness in the setting of acute renal failure, hyperkalemia and metabolic encephalopathy.  Patient required an emergent dialysis and aggressive hospitalized management.    Barton Dubois MD [facility]  How to contact the [facility] Attending or Consulting provider [place] or covering provider during after hours [place], for this patient?   1. Check the care team in [facility] and look for a) attending/consulting [facility] provider listed and b) the [facility] team listed 2. Log into [email or web address] and use [place] universal password to access. If you do not have the password, please contact the hospital operator. 3. Locate the [facility] provider you are looking for under [facility] and page to a number that you can be directly reached. 4. If you still have difficulty reaching the provider, please page the [facility] (Director on Call) for the Hospitalists listed on amion for assistance.  05/09/2020, 6:59 PM

## 2020-05-09 NOTE — Consult Note (Signed)
Reason for Consult: AKI and hyperkalemia Referring Physician: Avva  [name] is an 59 y.o. male with a PMH significant for alcoholism, major depressive disorder, anxiety, COPD, tobacco abuse,  Stroke with LUE weakness and deformity, and poorly controlled HTN who was brought to [facility] ED via EMS with acute onset of confusion and combativeness.  In the ED he was noted to be hypotensive with BP of 60/44, T 96.7, hypoxia (87% on room air).  Labs were notable for BUN/Cr of 124/9.66 (had been normal in March 2021), K 8.1, CO2 14, alb 3.7, WBC 13.3, Hgb 12.4, Plt 281, alcohol <10.  We were consulted to further evaluate and manage his AKI and electrolyte abnormalities.  Of note, he was taking lisinopril, chlorthalidone, potassium supplements, as well as ibuprofen.  He is confused but denies ingestion of any alcohol, moonshine, antifreeze, or suicidal attempt.  He has a foley catheter in place and is making urine.  Trend in Creatinine: Creatinine, Ser  Date/Time Value Ref Range Status  05/09/2020 11:38 AM 9.66 (H) 0.61 - 1.24 mg/dL Final  01/05/2020 02:23 PM 0.93 0.40 - 1.50 mg/dL Final  12/24/2019 11:40 AM 1.24 0.40 - 1.50 mg/dL Final  12/10/2019 10:29 AM 0.81 0.40 - 1.50 mg/dL Final  09/25/2019 05:48 AM 0.99 0.61 - 1.24 mg/dL Final  09/24/2019 06:27 AM 1.35 (H) 0.61 - 1.24 mg/dL Final  09/23/2019 02:51 PM 2.33 (H) 0.61 - 1.24 mg/dL Final  08/07/2018 11:09 AM 1.15 0.40 - 1.50 mg/dL Final  03/27/2018 08:52 PM 0.91 0.61 - 1.24 mg/dL Final  12/21/2017 10:09 PM 0.97 0.61 - 1.24 mg/dL Final  08/23/2017 05:05 PM 0.85 0.61 - 1.24 mg/dL Final  08/22/2017 10:39 AM 0.68 0.40 - 1.50 mg/dL Final  05/09/2017 02:29 PM 0.96 0.40 - 1.50 mg/dL Final  02/12/2017 03:21 PM 1.95 (H) 0.40 - 1.50 mg/dL Final  01/28/2017 09:42 PM 1.45 (H) 0.61 - 1.24 mg/dL Final  11/21/2016 02:42 PM 0.92 0.40 - 1.50 mg/dL Final  08/07/2014 02:21 PM 1.00 0.50 - 1.35 mg/dL Final  03/26/2011 05:16 AM 0.65 0.40 - 1.50 mg/dL Final   03/25/2011 03:54 AM 1.04 DELTA CHECK NOTED 0.40 - 1.50 mg/dL Final  03/24/2011 08:19 PM 1.77 (H) 0.40 - 1.50 mg/dL Final  02/06/2011 01:43 PM 0.71 0.40 - 1.50 mg/dL Final  11/14/2010 05:45 AM 0.88 0.40 - 1.50 mg/dL Final  11/09/2010 05:30 AM 0.85 0.40 - 1.50 mg/dL Final  11/07/2010 03:35 AM 0.74 DELTA CHECK NOTED 0.40 - 1.50 mg/dL Final  11/05/2010 03:40 AM 1.14 0.40 - 1.50 mg/dL Final  11/04/2010 04:50 AM 1.17 0.40 - 1.50 mg/dL Final  11/03/2010 04:20 PM 1.42 DELTA CHECK NOTED 0.40 - 1.50 mg/dL Final  11/02/2010 11:50 PM 2.84 (H) 0.40 - 1.50 mg/dL Final  11/02/2010 06:58 PM 4.11 (H) 0.40 - 1.50 mg/dL Final  11/24/2008 04:35 AM 0.75 0.40 - 1.50 mg/dL Final  11/23/2008 03:20 AM 0.80 0.40 - 1.50 mg/dL Final  11/20/2008 09:21 PM 1.06 0.40 - 1.50 mg/dL Final  10/17/2008 03:25 AM 0.70 0.40 - 1.50 mg/dL Final  10/15/2008 05:28 AM 0.85 0.40 - 1.50 mg/dL Final  10/14/2008 02:51 PM 0.77 0.40 - 1.50 mg/dL Final    PMH:   Past Medical History:  Diagnosis Date  . Alcoholism ([place])    Continues to abuse ETOH as of 01/2017; pt has poor insight into his problem.  Pt states that as of 04/2017 he has been 3 monthds sober.  . Anxiety and depression   . Arthritis    "hands,  legs" (12/23/2017)  . Chronic joint pain   . COPD (chronic obstructive pulmonary disease) ([place])   . Depression   . Frequent headaches    "depends on BP" (12/23/2017)  . History of kidney stones   . Hypertension   . Insomnia 08/26/2017   08/23/17 ED visit for adverse rxn to taking ambien, PLUS + marijuana on UDS her and in ED---NO FURTHER AMBIEN OR ANY OTHER CONTROLLED SUBSTANCES WILL BE PRESCRIBED BY ME--PM  . Pneumothorax, right   . Polysubstance abuse ([place])   . Poor historian    UNRELIABLE HISTORIAN  . 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  Baclofen oral no help.  Botox inj started by Dr. Posey Pronto 07/2018--no help x 1.  Inj #2 done 10/2018.  Marland Kitchen Stroke ([place]) 2012   LUE  residual deficit: flexion contracture and weakness of L hand.  Old bilateral cerebral infarcts involving the right frontal and parietal lobes as well as the left occipital lobe.  Baclofen and botox via neuro for L arm contracture.  . Stroke [facility])    "3 mini strokes since 2012" (12/23/2017)  . Subdural hematoma ([place]) 01/29/2017   Subacute right-sided frontal subdural hematoma: found on CT in [facility] ED, transferred to [facility] where this was non-operatively managed.  Pt admitted to the MDs at [facility] that he still drinks lots of whisky daily and they surmised that his frequent falls of late are due to this and frequent use of OTC sleep aids.  Near complete resolution on CT 03/27/18.  . Venous insufficiency of both lower extremities     PSH:   Past Surgical History:  Procedure Laterality Date  . CHEST TUBE INSERTION    . LUNG SURGERY    . SHOULDER OPEN ROTATOR CUFF REPAIR Bilateral    Multiple rotator cuff surgeries on both sides.  . TONSILLECTOMY    . TRANSTHORACIC ECHOCARDIOGRAM  09/24/2019   EF 55-60%, grd I DD.  Marland Kitchen TYMPANOPLASTY Right   . US CAROTIDS  09/24/2019   Minimal atherosclerotic plaque bilat; no flow obstruction  . VIDEO ASSISTED THORACOSCOPY (VATS) W/TALC PLEUADESIS  2010 X2    Allergies:  Allergies  Allergen Reactions  . Ambien [Zolpidem] Other (See Comments)    Pt took too much, acted out a bad dream/was throwing things, etc--ED visit (07/2017)    Medications:   Prior to Admission medications   Medication Sig Start Date End Date Taking? Authorizing Provider  aspirin EC 81 MG EC tablet Take 1 tablet (81 mg total) by mouth daily. 09/26/19   Loletha Grayer, MD  atorvastatin (LIPITOR) 40 MG tablet Take 1 tablet (40 mg total) by mouth daily at 6 PM. 12/10/19   McGowen, Adrian Blackwater, MD  chlorthalidone (HYGROTON) 25 MG tablet Take 1 tablet (25 mg total) by mouth daily. 04/21/20   McGowen, Adrian Blackwater, MD  felodipine (PLENDIL) 10 MG 24 hr tablet Take 1 tablet (10 mg total) by mouth daily.  03/24/20   McGowen, Adrian Blackwater, MD  lisinopril (ZESTRIL) 40 MG tablet Take 1 tablet (40 mg total) by mouth in the morning and at bedtime. 01/21/20   McGowen, Adrian Blackwater, MD  mirtazapine (REMERON) 15 MG tablet 1 tab po qhs x 15d, then increase to 2 tabs po qhs 04/21/20   McGowen, Adrian Blackwater, MD  nicotine (NICODERM CQ - DOSED IN MG/24 HOURS) 21 mg/24hr patch One 21mg  patch chest wall daily (may substitute generic) Patient not taking: Reported on  03/24/2020 09/25/19   Loletha Grayer, MD  potassium chloride SA (KLOR-CON) 20 MEQ tablet Take 1 tablet (20 mEq total) by mouth 2 (two) times daily. 12/13/19   Tammi Sou, MD  QUEtiapine (SEROQUEL) 400 MG tablet 1 tab po qhs 02/16/20   McGowen, Adrian Blackwater, MD  thiamine (VITAMIN B-1) 100 MG tablet Take 1 tablet (100 mg total) by mouth daily. 09/25/19   Loletha Grayer, MD  traZODone (DESYREL) 100 MG tablet Take 2 tablets (200 mg total) by mouth at bedtime. 12/10/19   McGowen, Adrian Blackwater, MD    Inpatient medications: . [START ON 05/10/2020] Chlorhexidine Gluconate Cloth  6 each Topical Q0600  . sodium bicarbonate        Discontinued Meds:   Medications Discontinued During This Encounter  Medication Reason  . vancomycin (VANCOCIN) IVPB 1000 mg/200 mL premix     Social History:  reports that he has been smoking cigarettes. He has a 44.00 pack-year smoking history. He has never used smokeless tobacco. He reports current drug use. Frequency: 2.00 times per week. Drug: Marijuana. He reports that he does not drink alcohol.  Family History:   Family History  Problem Relation Age of Onset  . Arthritis Mother   . Heart disease Father   . Hypertension Father   . Breast cancer Maternal Aunt   . Alcohol abuse Maternal Uncle   . Lung cancer Maternal Aunt   . Cancer Brother        bone    Review of systems not obtained due to patient factors. Weight change:  No intake or output data in the 24 hours ending 05/09/20 1417 BP (!) 74/54   Pulse 82   Temp (!) 96.7  F (35.9 C) (Rectal)   Resp 14   Ht 5\' 7"  (1.702 m)   Wt 79 kg   SpO2 100%   BMI 27.28 kg/m  Vitals:   05/09/20 1119 05/09/20 1121 05/09/20 1149 05/09/20 1259  BP: (!) 76/41  (!) 60/44 (!) 74/54  Pulse: (!) 34  79 82  Resp: 16   14  Temp: (!) 96.7 F (35.9 C)     TempSrc: Rectal     SpO2: (!) 87%  100% 100%  Weight:  79 kg    Height:  5\' 7"  (1.702 m)       General appearance: delirious, pale and agitated Head: Normocephalic, without obvious abnormality, atraumatic Resp: clear to auscultation bilaterally Cardio: regular rate and rhythm, S1, S2 normal, no murmur, click, rub or gallop GI: soft, non-tender; bowel sounds normal; no masses,  no organomegaly Extremities: extremities normal, atraumatic, no cyanosis or edema  Labs: Basic Metabolic Panel: Recent Labs  Lab 05/09/20 1138  NA 134*  K >7.5*  CL 105  CO2 14*  GLUCOSE 122*  BUN 124*  CREATININE 9.66*  ALBUMIN 3.7  CALCIUM 9.6   Liver Function Tests: Recent Labs  Lab 05/09/20 1138  AST 14*  ALT 11  ALKPHOS 101  BILITOT 0.5  PROT 7.2  ALBUMIN 3.7   No results for input(s): LIPASE, AMYLASE in the last 168 hours. Recent Labs  Lab 05/09/20 1139  AMMONIA 9   CBC: Recent Labs  Lab 05/09/20 1138  WBC 13.3*  NEUTROABS 11.2*  HGB 12.4*  HCT 37.9*  MCV 101.3*  PLT 281   PT/INR: @LABRCNTIP (inr:5) Cardiac Enzymes: )No results for input(s): CKTOTAL, CKMB, CKMBINDEX, TROPONINI in the last 168 hours. CBG: Recent Labs  Lab 05/09/20 1123  GLUCAP 234*    Iron Studies:  No results for input(s): IRON, TIBC, TRANSFERRIN, FERRITIN in the last 168 hours.  Xrays/Other Studies: DG Chest Port 1 View  Result Date: 05/09/2020 CLINICAL DATA:  Confusion, weakness EXAM: PORTABLE CHEST 1 VIEW COMPARISON:  09/23/2019 FINDINGS: The heart size and mediastinal contours are within normal limits. No focal airspace consolidation, pleural effusion, or pneumothorax. The visualized skeletal structures are unremarkable.  IMPRESSION: No active disease. Electronically Signed   By: Davina Poke D.O.   On: 05/09/2020 11:44     Assessment/Plan: 1.  AKI- possible ischemic ATN in setting of hypotension with concomitant ACE-inhibitor as well as NSAIDs and diuretics.  Possible ingestion, although he denies it at this time. Unfortunately we do not have any labs since March 2021.  The only new medication started was chlorthalidone on 04/21/20 but no labs were drawn due to phlebotomist was not able to get any blood.  He has received multiple liters of NS in the ED and is starting to make urine 1. Plan for emergent dialysis in the ED given his hyperkalemia after HD catheter placed by Dr. Elsworth Soho.   2. Renal US to r/o obstruction and evaluate kidney size and contours. 3. tox screen pending and will add ethylene glycol and methanol (although he denies anything and family supports this). 4. Follow UOP and renal panel following dialysis 2. Hyperkalemia- given IV insulin/D5, IV bicarb, and IV calcium.  Will repeat stat bmet now but will likely require emergent HD in the Ed.  3. Metabolic acidosis- given IV bicarb x 2.  Will start isotonic bicarb drip.  Lactate normal at 1.1. 4. Hypotension/sepsis/hypothermia- unclear etiology.  Cultures drawn and pending.  Given Abx in ED.  Appreciate Dr. Bari Mantis assistance. 5. HTN- initially low now stable 6. AMS- unclear etiology.  Workup underway and will eventually need CT scan vs mri.  NH3 normal at 9.   Governor Rooks Jw Covin 05/09/2020, 2:17 PM

## 2020-05-09 NOTE — Procedures (Signed)
     EMERGENT HEMODIALYSIS TREATMENT NOTE:  2.5 hour heparin-free HD completed via RIJ temporary HD cath.  Kept even / no fluid removed as per order.   Pt was restless, agitated, and uncooperative throughout session, oriented to self only and hallucinating (presence of family members in room).  Received Haloperidol 4mg  in ED just prior to transfer to ICU for dialysis.  Catheter tolerated prescribed Qb 250 but with frequent alarms and pauses in flow due to pressure fluctuations from restless pt movement.  Pt was pulling at lines and monitor wires; posey mitts were placed for safety.  Total run time: 2.5 hours Net UF: +737cc   Rockwell Alexandria, RN

## 2020-05-10 DIAGNOSIS — G9341 Metabolic encephalopathy: Secondary | ICD-10-CM

## 2020-05-10 LAB — CBC
HCT: 34.9 % — ABNORMAL LOW (ref 39.0–52.0)
Hemoglobin: 12 g/dL — ABNORMAL LOW (ref 13.0–17.0)
MCH: 33.8 pg (ref 26.0–34.0)
MCHC: 34.4 g/dL (ref 30.0–36.0)
MCV: 98.3 fL (ref 80.0–100.0)
Platelets: 251 10*3/uL (ref 150–400)
RBC: 3.55 MIL/uL — ABNORMAL LOW (ref 4.22–5.81)
RDW: 13 % (ref 11.5–15.5)
WBC: 8.8 10*3/uL (ref 4.0–10.5)
nRBC: 0 % (ref 0.0–0.2)

## 2020-05-10 LAB — RENAL FUNCTION PANEL
Albumin: 3.3 g/dL — ABNORMAL LOW (ref 3.5–5.0)
Anion gap: 11 (ref 5–15)
BUN: 55 mg/dL — ABNORMAL HIGH (ref 8–23)
CO2: 26 mmol/L (ref 22–32)
Calcium: 8.7 mg/dL — ABNORMAL LOW (ref 8.9–10.3)
Chloride: 102 mmol/L (ref 98–111)
Creatinine, Ser: 3.91 mg/dL — ABNORMAL HIGH (ref 0.61–1.24)
GFR calc Af Amer: 18 mL/min — ABNORMAL LOW (ref >60)
GFR calc non Af Amer: 16 mL/min — ABNORMAL LOW (ref >60)
Glucose, Bld: 89 mg/dL (ref 70–99)
Phosphorus: 3.1 mg/dL (ref 2.5–4.6)
Potassium: 4.8 mmol/L (ref 3.5–5.1)
Sodium: 139 mmol/L (ref 135–145)

## 2020-05-10 LAB — URINE CULTURE: Culture: NO GROWTH

## 2020-05-10 LAB — RPR: RPR Ser Ql: NONREACTIVE

## 2020-05-10 MED ORDER — CYANOCOBALAMIN 1000 MCG/ML IJ SOLN
1000.0000 ug | Freq: Once | INTRAMUSCULAR | Status: AC
  Administered 2020-05-10: 1000 ug via INTRAMUSCULAR
  Filled 2020-05-10: qty 1

## 2020-05-10 MED ORDER — SODIUM CHLORIDE 0.9 % IV SOLN: INTRAVENOUS | Status: DC

## 2020-05-10 MED ORDER — SODIUM BICARBONATE 8.4 % IV SOLN
INTRAVENOUS | Status: AC
  Filled 2020-05-10: qty 150

## 2020-05-10 NOTE — Progress Notes (Addendum)
Patient Demographics:    [name], is a 59 y.o. male, DOB - 1961/04/27, QJJ:[id number]  Admit date - 05/09/2020   Admitting Physician Barton Dubois, MD  Outpatient Primary MD for the patient is McGowen, Adrian Blackwater, MD  LOS - 1   Chief Complaint  Patient presents with  . 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, HTN, depressive disorder with anxiety, COPD and tobacco abuse admitted on 05/09/2020 with AKI, hyperkalemia and hypotension with metabolic acidosis required emergent hemodialysis on 05/09/2020  A/p 1)AKI----acute kidney injury --worsening renal function was due to presumed ATN in the setting of hypotension compounded by concomitant ACE inhibitor as well as NSAID and diuretic use   --creatinine on admission= 9.66 , baseline creatinine = reportedly WNL previously   , creatinine is now=3.9 ,  renally adjust medications, avoid nephrotoxic agents / dehydration  / hypotension -Status post emergency hemodialysis on 05/09/2020 -Renal ultrasound without obstructive uropathy -Tox screen pending -Metabolic acidosis-resolved  2)Hyperkalemia--secondary to #1 above, potassium is  down to 4.8 from 7.5 after emergent hemodialysis on 05/09/2020  3)HTN--hypotension  resolved, continue to hold ACE and diuretics  4)Acute metabolic encephalopathy--- multifactorial but mostly due to #1  above -CT head w/o acute findings -UDS negative  5)COPD/Tobacco Abuse--- continue bronchodilators, nicotine patch as ordered  6)H/o Prior CVA--residual left hemiparesis, continue aspirin and statins  7) B12 deficiency--- give IM B12 injection  Disposition/Need for in-Hospital Stay- patient unable to be discharged at this time due to --- acute kidney injury and hyperkalemia requiring hemodialysis, still requiring further IV hydration awaiting further improvement in renal function  Status is: Inpatient  Remains inpatient appropriate because:acute kidney injury and hyperkalemia requiring hemodialysis, still requiring further IV hydration awaiting further improvement in renal function   Disposition: The patient is from: Home              Anticipated d/c is to: SNF              Anticipated d/c date is: > 3 days              Patient currently is not medically stable to d/c. Barriers: Not Clinically Stable- -acute kidney injury and hyperkalemia requiring hemodialysis, still requiring further IV hydration awaiting further improvement in renal function  Code Status : full code  Family Communication:   -- -left message for patient's daughter [name]  Consults  : Nephrology and PCCM  DVT Prophylaxis  :   - SCDs  Lab Results  Component Value Date   PLT 251 05/10/2020    Inpatient Medications  Scheduled Meds: . aspirin EC  81 mg Oral Daily  . atorvastatin  40 mg  Oral q1800  . Chlorhexidine Gluconate Cloth  6 each Topical Q0600  . folic acid  1 mg Oral Daily  . LORazepam  0-4 mg Intravenous Q6H   Followed by  . [START ON 05/11/2020] LORazepam  0-4 mg Intravenous Q12H  . multivitamin with minerals  1 tablet Oral Daily  . nicotine  21 mg Transdermal Daily  . thiamine  100 mg Oral Daily   Or  . thiamine  100 mg Intravenous Daily   Continuous Infusions: . sodium chloride    . sodium chloride    .  sodium bicarbonate (isotonic) infusion in sterile water 125 mL/hr at 05/10/20 1023     PRN Meds:.sodium chloride, sodium chloride, alteplase, haloperidol lactate, heparin, LORazepam **OR** LORazepam, ondansetron **OR** ondansetron (ZOFRAN) IV    Anti-infectives (From admission, onward)   Start     Dose/Rate Route Frequency Ordered Stop   05/09/20 1200  vancomycin (VANCOREADY) IVPB 1500 mg/300 mL        1,500 mg 150 mL/hr over 120 Minutes Intravenous  Once 05/09/20 1156 05/09/20 1522   05/09/20 1130  vancomycin (VANCOCIN) IVPB 1000 mg/200 mL premix  Status:  Discontinued        1,000 mg 200 mL/hr over 60 Minutes Intravenous  Once 05/09/20 1120 05/09/20 1156   05/09/20 1130  piperacillin-tazobactam (ZOSYN) IVPB 3.375 g        3.375 g 100 mL/hr over 30 Minutes Intravenous  Once 05/09/20 1120 05/09/20 1250        Objective:   Vitals:   05/10/20 1200 05/10/20 1300 05/10/20 1322 05/10/20 1400  BP: (!) 86/65  (!) 86/54 103/62  Pulse: 95 93 96 97  Resp: (!) 23 (!) 21 17 18   Temp: 97.6 F (36.4 C)     TempSrc: Axillary     SpO2: 97% 96% 97% 97%  Weight:      Height:        Wt Readings from Last 3 Encounters:  05/10/20 77.9 kg  04/21/20 79.8 kg  03/24/20 79.9 kg     Intake/Output Summary (Last 24 hours) at 05/10/2020 1418 Last data filed at 05/10/2020 1400 Gross per 24 hour  Intake 1628.01 ml  Output 4263 ml  Net -2634.99 ml     Physical Exam  Gen:-Confused, disoriented, lethargic  HEENT:- [place].AT, No sclera icterus Nose- [place] 2 L/min Neck-Supple Neck,No JVD,.  Right IJ HD catheter Lungs-diminished in bases, no wheezing  CV- S1, S2 normal, regular  Abd-  +ve B.Sounds, Abd Soft, No tenderness,    Extremity/Skin:- No  edema, pedal pulses present  NeuroPsych-Limited exam as patient is sleepy after Ativan, previously agitated and confused  Data Review:   Micro Results Recent Results (from the past 240 hour(s))  Urine culture     Status: None   Collection Time: 05/09/20 11:34 AM   Specimen: In/Out Cath Urine  Result Value Ref Range Status   Specimen  Description   Final    IN/OUT CATH URINE Performed at [facility], [address] [place], [place] [address]    Special Requests   Final    NONE Performed at [facility], [address] [place], [place] [address]    Culture   Final    NO GROWTH Performed at [facility], [place] [address] [place], [place] [address]    Report Status 05/10/2020 FINAL  Final  Blood Culture (routine x 2)     Status: None (Preliminary result)   Collection Time: 05/09/20 11:40 AM  Specimen: BLOOD RIGHT FOREARM  Result Value Ref Range Status   Specimen Description BLOOD RIGHT FOREARM DRAWN BY RN TBV  Final   Special Requests   Final    BOTTLES DRAWN AEROBIC AND ANAEROBIC Blood Culture adequate volume   Culture   Final    NO GROWTH < 24 HOURS Performed at [facility], [address] [place], [place] [address]    Report Status PENDING  Incomplete  Blood Culture (routine x 2)     Status: None (Preliminary result)   Collection Time: 05/09/20 11:45 AM   Specimen: BLOOD  Result Value Ref Range Status   Specimen Description BLOOD RIGHT ANTECUBITAL  Final   Special Requests   Final    BOTTLES DRAWN AEROBIC AND ANAEROBIC Blood Culture adequate volume   Culture   Final    NO GROWTH < 24 HOURS Performed at [facility], [address], [place], [place] [address]    Report Status PENDING  Incomplete  SARS Coronavirus 2 by RT PCR (hospital order, performed in [place] hospital lab) Nasopharyngeal Nasopharyngeal Swab     Status: None   Collection Time: 05/09/20  3:25 PM   Specimen: Nasopharyngeal Swab  Result Value Ref Range Status   SARS Coronavirus 2 NEGATIVE NEGATIVE Final    Comment: (NOTE) SARS-CoV-2 target nucleic acids are NOT DETECTED.  The SARS-CoV-2 RNA is generally detectable in upper and lower respiratory specimens during the acute phase of infection. The lowest concentration of SARS-CoV-2 viral copies this assay can detect is 250 copies / mL. A negative result does not preclude  SARS-CoV-2 infection and should not be used as the sole basis for treatment or other patient management decisions.  A negative result may occur with improper specimen collection / handling, submission of specimen other than nasopharyngeal swab, presence of viral mutation(s) within the areas targeted by this assay, and inadequate number of viral copies (<250 copies / mL). A negative result must be combined with clinical observations, patient history, and epidemiological information.  Fact Sheet for Patients:   StrictlyIdeas.no  Fact Sheet for Healthcare Providers: BankingDealers.co.za  This test is not yet approved or  cleared by the [place] FDA and has been authorized for detection and/or diagnosis of SARS-CoV-2 by FDA under an Emergency Use Authorization (EUA).  This EUA will remain in effect (meaning this test can be used) for the duration of the COVID-19 declaration under Section 564(b)(1) of the Act, 21 U.S.C. section 360bbb-3(b)(1), unless the authorization is terminated or revoked sooner.  Performed at [facility], [address], [place], [place] [address]   MRSA PCR Screening     Status: None   Collection Time: 05/09/20  5:04 PM   Specimen: Nasal Mucosa; Nasopharyngeal  Result Value Ref Range Status   MRSA by PCR NEGATIVE NEGATIVE Final    Comment:        The GeneXpert MRSA Assay (FDA approved for NASAL specimens only), is one component of a comprehensive MRSA colonization surveillance program. It is not intended to diagnose MRSA infection nor to guide or monitor treatment for MRSA infections. Performed at [facility], [address] [place], [place] [address]     Radiology Reports CT HEAD WO CONTRAST  Result Date: 05/09/2020 CLINICAL DATA:  59 year old male with neurologic deficit. EXAM: CT HEAD WITHOUT CONTRAST TECHNIQUE: Contiguous axial images were obtained from the base of the skull through the vertex  without intravenous contrast. COMPARISON:  Head CT dated 09/23/2019. FINDINGS: Evaluation of this exam is limited due  to motion artifact. Brain: Large right MCA territory old infarct and encephalomalacia as well as additional areas of old infarct involving the occipital lobes. There is mild age-related atrophy and chronic microvascular ischemic changes. There is no acute intracranial hemorrhage. No mass effect or midline shift. No extra-axial fluid collection. Vascular: No hyperdense vessel or unexpected calcification. Skull: Normal. Negative for fracture or focal lesion. Sinuses/Orbits: No acute finding. Other: None IMPRESSION: 1. No acute intracranial hemorrhage. 2. Age-related atrophy and chronic microvascular ischemic changes. Multiple old infarcts. Electronically Signed   By: Anner Crete M.D.   On: 05/09/2020 21:50   US RENAL  Result Date: 05/09/2020 CLINICAL DATA:  Acute kidney injury. EXAM: RENAL / URINARY TRACT ULTRASOUND COMPLETE COMPARISON:  Abdominal ultrasound dated November 03, 2010. FINDINGS: Right Kidney: Renal measurements: 11.6 x 5.0 x 5.6 cm = volume: 172 mL . Echogenicity within normal limits. No mass or hydronephrosis visualized. Left Kidney: Renal measurements: 12.1 x 6.3 x 6.4 cm = volume: 256 mL. Echogenicity within normal limits. No mass or hydronephrosis visualized. Bladder: Decompressed by Foley catheter. Other: None. IMPRESSION: 1. Normal renal ultrasound. Electronically Signed   By: Titus Dubin M.D.   On: 05/09/2020 16:10   DG Chest Port 1 View  Result Date: 05/09/2020 CLINICAL DATA:  Confusion, weakness EXAM: PORTABLE CHEST 1 VIEW COMPARISON:  09/23/2019 FINDINGS: The heart size and mediastinal contours are within normal limits. No focal airspace consolidation, pleural effusion, or pneumothorax. The visualized skeletal structures are unremarkable. IMPRESSION: No active disease. Electronically Signed   By: Davina Poke D.O.   On: 05/09/2020 11:44   DG Chest Port 1V  same Day  Result Date: 05/09/2020 CLINICAL DATA:  Dialysis catheter placement. EXAM: PORTABLE CHEST 1 VIEW COMPARISON:  05/09/2020 at 11:32 a.m. FINDINGS: A right jugular catheter has been placed and terminates over the mid SVC. The cardiomediastinal silhouette is unchanged with normal heart size. No confluent airspace opacity, edema, pleural effusion, pneumothorax is identified. Postsurgical changes are noted in the right lung apex. IMPRESSION: Right jugular catheter placement as above. Electronically Signed   By: Logan Bores M.D.   On: 05/09/2020 16:41     CBC Recent Labs  Lab 05/09/20 1138 05/09/20 1857 05/10/20 0447  WBC 13.3* 7.2 8.8  HGB 12.4* 9.4* 12.0*  HCT 37.9* 28.6* 34.9*  PLT 281 211 251  MCV 101.3* 99.7 98.3  MCH 33.2 32.8 33.8  MCHC 32.7 32.9 34.4  RDW 13.4 13.2 13.0  LYMPHSABS 1.1  --   --   MONOABS 0.9  --   --   EOSABS 0.1  --   --   BASOSABS 0.0  --   --     Chemistries  Recent Labs  Lab 05/09/20 1138 05/09/20 1427 05/09/20 1857 05/10/20 0447  NA 134* 135 135 139  K >7.5* 6.2* 3.7 4.8  CL 105 110 104 102  CO2 14* 14* 21* 26  GLUCOSE 122* 93 95 89  BUN 124* 120* 58* 55*  CREATININE 9.66* 8.77* 4.27* 3.91*  CALCIUM 9.6 8.4* 8.0* 8.7*  MG  --   --  1.5*  --   AST 14*  --  15  --   ALT 11  --  9  --   ALKPHOS 101  --  73  --   BILITOT 0.5  --  0.9  --    ------------------------------------------------------------------------------------------------------------------ No results for input(s): CHOL, HDL, LDLCALC, TRIG, CHOLHDL, LDLDIRECT in the last 72 hours.  Lab Results  Component Value Date  HGBA1C (H) 11/02/2010    5.8 (NOTE)                                                                       According to the ADA Clinical Practice Recommendations for 2011, when HbA1c is used as a screening test:   >=6.5%   Diagnostic of Diabetes Mellitus           (if abnormal result  is confirmed)  5.7-6.4%   Increased risk of developing Diabetes Mellitus   References:Diagnosis and Classification of Diabetes Mellitus,Diabetes DTOI,7124,58(KDXIP 1):S62-S69 and Standards of Medical Care in         Diabetes - 2011,Diabetes JASN,0539,76  (Suppl 1):S11-S61.   ------------------------------------------------------------------------------------------------------------------ Recent Labs    05/09/20 1857  TSH 0.778   ------------------------------------------------------------------------------------------------------------------ Recent Labs    05/09/20 1857  VITAMINB12 165*    Coagulation profile Recent Labs  Lab 05/09/20 1138  INR 1.0    No results for input(s): DDIMER in the last 72 hours.  Cardiac Enzymes No results for input(s): CKMB, TROPONINI, MYOGLOBIN in the last 168 hours.  Invalid input(s): CK ------------------------------------------------------------------------------------------------------------------    Component Value Date/Time   BNP 45.0 03/27/2018 2052     Roxan Hockey M.D on 05/10/2020 at 2:18 PM  Go to [email or web address] - for contact info  [facility] - Office  [phone]

## 2020-05-10 NOTE — Progress Notes (Signed)
Patient ID: [name], male   DOB: Apr 04, 1961, 59 y.o.   MRN: [id number] S: Pt currently sedated after ativan.  Was very agitated but able to get through a session of HD last night with improvement of his electrolytes.  Good UOP. O:BP (!) 88/61   Pulse 98   Temp 97.6 F (36.4 C) (Axillary)   Resp 18   Ht 5\' 7"  (1.702 m)   Wt 77.9 kg   SpO2 98%   BMI 26.90 kg/m   Intake/Output Summary (Last 24 hours) at 05/10/2020 1245 Last data filed at 05/10/2020 1023 Gross per 24 hour  Intake 1628.01 ml  Output 3363 ml  Net -1734.99 ml   Intake/Output: I/O last 3 completed shifts: In: 788.6 [I.V.:487; IV Piggyback:301.6] Out: 863 [Urine:1600]  Intake/Output this shift:  Total I/O In: 839.4 [I.V.:839.4] Out: 2500 [Urine:2500] Weight change:  Gen: sedated CVS: RRR, no rub Resp: cta Abd: +BS, soft, NT/ND Ext: no edema  Recent Labs  Lab 05/09/20 1138 05/09/20 1427 05/09/20 1857 05/10/20 0447  NA 134* 135 135 139  K >7.5* 6.2* 3.7 4.8  CL 105 110 104 102  CO2 14* 14* 21* 26  GLUCOSE 122* 93 95 89  BUN 124* 120* 58* 55*  CREATININE 9.66* 8.77* 4.27* 3.91*  ALBUMIN 3.7  --  2.7* 3.3*  CALCIUM 9.6 8.4* 8.0* 8.7*  PHOS  --   --  2.6 3.1  AST 14*  --  15  --   ALT 11  --  9  --    Liver Function Tests: Recent Labs  Lab 05/09/20 1138 05/09/20 1857 05/10/20 0447  AST 14* 15  --   ALT 11 9  --   ALKPHOS 101 73  --   BILITOT 0.5 0.9  --   PROT 7.2 5.2*  --   ALBUMIN 3.7 2.7* 3.3*   No results for input(s): LIPASE, AMYLASE in the last 168 hours. Recent Labs  Lab 05/09/20 1139  AMMONIA 9   CBC: Recent Labs  Lab 05/09/20 1138 05/09/20 1857 05/10/20 0447  WBC 13.3* 7.2 8.8  NEUTROABS 11.2*  --   --   HGB 12.4* 9.4* 12.0*  HCT 37.9* 28.6* 34.9*  MCV 101.3* 99.7 98.3  PLT 281 211 251   Cardiac Enzymes: No results for input(s): CKTOTAL, CKMB, CKMBINDEX, TROPONINI in the last 168 hours. CBG: Recent Labs  Lab 05/09/20 1123  GLUCAP 234*    Iron Studies: No  results for input(s): IRON, TIBC, TRANSFERRIN, FERRITIN in the last 72 hours. Studies/Results: CT HEAD WO CONTRAST  Result Date: 05/09/2020 CLINICAL DATA:  59 year old male with neurologic deficit. EXAM: CT HEAD WITHOUT CONTRAST TECHNIQUE: Contiguous axial images were obtained from the base of the skull through the vertex without intravenous contrast. COMPARISON:  Head CT dated 09/23/2019. FINDINGS: Evaluation of this exam is limited due to motion artifact. Brain: Large right MCA territory old infarct and encephalomalacia as well as additional areas of old infarct involving the occipital lobes. There is mild age-related atrophy and chronic microvascular ischemic changes. There is no acute intracranial hemorrhage. No mass effect or midline shift. No extra-axial fluid collection. Vascular: No hyperdense vessel or unexpected calcification. Skull: Normal. Negative for fracture or focal lesion. Sinuses/Orbits: No acute finding. Other: None IMPRESSION: 1. No acute intracranial hemorrhage. 2. Age-related atrophy and chronic microvascular ischemic changes. Multiple old infarcts. Electronically Signed   By: Anner Crete M.D.   On: 05/09/2020 21:50   US RENAL  Result Date: 05/09/2020 CLINICAL DATA:  Acute kidney injury. EXAM: RENAL / URINARY TRACT ULTRASOUND COMPLETE COMPARISON:  Abdominal ultrasound dated November 03, 2010. FINDINGS: Right Kidney: Renal measurements: 11.6 x 5.0 x 5.6 cm = volume: 172 mL . Echogenicity within normal limits. No mass or hydronephrosis visualized. Left Kidney: Renal measurements: 12.1 x 6.3 x 6.4 cm = volume: 256 mL. Echogenicity within normal limits. No mass or hydronephrosis visualized. Bladder: Decompressed by Foley catheter. Other: None. IMPRESSION: 1. Normal renal ultrasound. Electronically Signed   By: Titus Dubin M.D.   On: 05/09/2020 16:10   DG Chest Port 1 View  Result Date: 05/09/2020 CLINICAL DATA:  Confusion, weakness EXAM: PORTABLE CHEST 1 VIEW COMPARISON:   09/23/2019 FINDINGS: The heart size and mediastinal contours are within normal limits. No focal airspace consolidation, pleural effusion, or pneumothorax. The visualized skeletal structures are unremarkable. IMPRESSION: No active disease. Electronically Signed   By: Davina Poke D.O.   On: 05/09/2020 11:44   DG Chest Port 1V same Day  Result Date: 05/09/2020 CLINICAL DATA:  Dialysis catheter placement. EXAM: PORTABLE CHEST 1 VIEW COMPARISON:  05/09/2020 at 11:32 a.m. FINDINGS: A right jugular catheter has been placed and terminates over the mid SVC. The cardiomediastinal silhouette is unchanged with normal heart size. No confluent airspace opacity, edema, pleural effusion, pneumothorax is identified. Postsurgical changes are noted in the right lung apex. IMPRESSION: Right jugular catheter placement as above. Electronically Signed   By: Logan Bores M.D.   On: 05/09/2020 16:41   . aspirin EC  81 mg Oral Daily  . atorvastatin  40 mg Oral q1800  . Chlorhexidine Gluconate Cloth  6 each Topical Q0600  . folic acid  1 mg Oral Daily  . LORazepam  0-4 mg Intravenous Q6H   Followed by  . [START ON 05/11/2020] LORazepam  0-4 mg Intravenous Q12H  . multivitamin with minerals  1 tablet Oral Daily  . nicotine  21 mg Transdermal Daily  . thiamine  100 mg Oral Daily   Or  . thiamine  100 mg Intravenous Daily    BMET    Component Value Date/Time   NA 139 05/10/2020 0447   NA 134 (A) 01/29/2017 0000   K 4.8 05/10/2020 0447   CL 102 05/10/2020 0447   CO2 26 05/10/2020 0447   GLUCOSE 89 05/10/2020 0447   BUN 55 (H) 05/10/2020 0447   BUN 28 (A) 01/29/2017 0000   CREATININE 3.91 (H) 05/10/2020 0447   CREATININE 1.01 04/03/2018 1528   CALCIUM 8.7 (L) 05/10/2020 0447   GFRNONAA 16 (L) 05/10/2020 0447   GFRAA 18 (L) 05/10/2020 0447   CBC    Component Value Date/Time   WBC 8.8 05/10/2020 0447   RBC 3.55 (L) 05/10/2020 0447   HGB 12.0 (L) 05/10/2020 0447   HCT 34.9 (L) 05/10/2020 0447   PLT 251  05/10/2020 0447   MCV 98.3 05/10/2020 0447   MCH 33.8 05/10/2020 0447   MCHC 34.4 05/10/2020 0447   RDW 13.0 05/10/2020 0447   LYMPHSABS 1.1 05/09/2020 1138   MONOABS 0.9 05/09/2020 1138   EOSABS 0.1 05/09/2020 1138   BASOSABS 0.0 05/09/2020 1138     Assessment/Plan: 1.  AKI- possible ischemic ATN in setting of hypotension with concomitant ACE-inhibitor as well as NSAIDs and diuretics.  Possible ingestion, although he denies it at this time. Unfortunately we do not have any labs since March 2021.  The only new medication started was chlorthalidone on 04/21/20 but no labs were drawn due to phlebotomist was not able  to get any blood.  He has received multiple liters of NS in the ED and is starting to make urine 1. S/p emergent dialysis in the ED 05/09/20 due to hyperkalemia  2. HD catheter placed by Dr. Elsworth Soho in the ED 05/09/20.   3. Renal US was unremarkable. 4. tox screen pending including ethylene glycol and methanol (although he denies anything and family supports this). 5. Follow UOP and renal panel following dialysis 6. Hold off on further dialysis for now given marked improvement of UOP 2. Hyperkalemia- given IV insulin/D5, IV bicarb, and IV calcium.   1. Improved with HD 3. Metabolic acidosis- given IV bicarb x 2 and isotonic bicarb drip.  Lactate normal at 1.1.  IMproved and w 4. Hypotension/sepsis/hypothermia- unclear etiology.  Cultures drawn and pending.  Given Abx in ED.  Appreciate Dr. Bari Mantis assistance. 5. HTN- initially low now stable 6. Acute metabolic encephlopathy- unclear etiology.  Workup underway.   CT scan negative, NH3 WNL.   Donetta Potts, MD [employer] [phone]

## 2020-05-10 NOTE — Progress Notes (Signed)
NAME:  [name], MRN:  [id number], DOB:  09-11-1961, LOS: 1 ADMISSION DATE:  05/09/2020, CONSULTATION DATE:  05/10/2020  REFERRING MD:  Roderic Palau, EDP, CHIEF COMPLAINT:  Confusion, renal failure   Brief History   59 year old man with history of depression, old CVA and left hemiparesis presents with confusion, AKI and hyperkalemia  History of present illness   Patient was confused and history is obtained after review of chart, speaking to ED physician and daughter [name].  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  He has failed fluoxetine and Cymbalta in the past.  Per daughter he stopped this medication since it did not work for him Was also started on chlorthalidone EMS was called this morning due to confusion and combativeness since 0700, last seen normal was 2130 last night, initial vital signs showed him to be hypotensive, hypoxic 87% on room air.  Labs showed AKI with BUN/creatinine of 124/9.6 and potassium of 8.1 with mild leukocytosis, alcohol level less than 10  Home med list includes lisinopril, chlorthalidone and potassium supplements.  He admits to taking ibuprofen for back pain.  He denies alcohol or any other drug intake.  He denies any attempt to harm himself He was given insulin/D50, IV bicarbonate and calcium for hyperkalemia.  PCCM consulted while awaiting nephrology input  Past Medical History  CVA with left hemiparesis Major depression Hypertension  Significant Hospital Events   7/13 admit  Consults:  Nephrology  Procedures:  7/13 right IJ  Significant Diagnostic Tests:  Ultrasound renal 7/13>> nml Head CT 7/13 >> Large right MCA territory old infarct and encephalomalacia as well as additional areas of old infarct involving the occipital lobes.   Micro Data:  Blood 7/13 >>  ng Urine 7/13  Antimicrobials:  Zosyn Vancomycin  Interim history/subjective:   Afebrile HD yesterday noted Agitated this am & got ativan  Objective   Blood pressure 126/79, pulse 99, temperature 98.2 F (36.8 C), temperature source Oral, resp. rate 15, height 5\' 7"  (1.702 m), weight 77.9 kg, SpO2 95 %.        Intake/Output Summary (Last 24 hours) at 05/10/2020 0904 Last data filed at 05/10/2020 4081 Gross per 24 hour  Intake 788.64 ml  Output 3363 ml  Net -2574.36 ml   Filed Weights   05/09/20 1121 05/10/20 0405  Weight: 79 kg 77.9 kg    Examination: General: Chronically ill-appearing man, somnolent after receiving ativan HENT: No pallor, no icterus, no JVD Lungs: No accessory muscle use, clear breath sounds bilateral Cardiovascular: S1-S2 regular, no rub Abdomen: Soft, nontender Extremities: No edema, no deformity Neuro: RASS-2 , left hemiparesis and flexion contracture with tremors GU: Foley, clear urine  Chest x-ray shows clear lungs Labs >> K decreased ,no leucocytosis   Resolved Hospital Problem list     Assessment & Plan:  Acute encephalopathy in the setting of AKI with hyperkalemia, home meds include lisinopril and potassium.  He was taking NSAIDs, appeared dehydrated , no evidence of sepsis and acute stroke -No evidence or history of toxin ingestion , UDS neg  AKI with hyperkalemia -treated with cocktail  -improved with emergent dialysis -non oliguric, can dc bicarb -can dc HD cath once full recovery     Acute encephalopathy -likely related to above -Head CT to rule out acute CVA, prior chronic infarcts  noted on MRI from 08/2019 -Haldol as needed for delirium ,doubt ETOH withdrawal , may need more history from family  PCCM will be available as needed  Kara Mead MD. FCCP. [facility]  If no response to pager , please call [phone]   05/10/2020

## 2020-05-11 LAB — RENAL FUNCTION PANEL
Albumin: 3.1 g/dL — ABNORMAL LOW (ref 3.5–5.0)
Anion gap: 10 (ref 5–15)
BUN: 44 mg/dL — ABNORMAL HIGH (ref 8–23)
CO2: 29 mmol/L (ref 22–32)
Calcium: 8 mg/dL — ABNORMAL LOW (ref 8.9–10.3)
Chloride: 100 mmol/L (ref 98–111)
Creatinine, Ser: 2.06 mg/dL — ABNORMAL HIGH (ref 0.61–1.24)
GFR calc Af Amer: 39 mL/min — ABNORMAL LOW (ref >60)
GFR calc non Af Amer: 34 mL/min — ABNORMAL LOW (ref >60)
Glucose, Bld: 103 mg/dL — ABNORMAL HIGH (ref 70–99)
Phosphorus: 2.8 mg/dL (ref 2.5–4.6)
Potassium: 4.5 mmol/L (ref 3.5–5.1)
Sodium: 139 mmol/L (ref 135–145)

## 2020-05-11 LAB — COMPREHENSIVE METABOLIC PANEL
ALT: 12 U/L (ref 0–44)
AST: 15 U/L (ref 15–41)
Albumin: 3.1 g/dL — ABNORMAL LOW (ref 3.5–5.0)
Alkaline Phosphatase: 83 U/L (ref 38–126)
Anion gap: 9 (ref 5–15)
BUN: 44 mg/dL — ABNORMAL HIGH (ref 8–23)
CO2: 29 mmol/L (ref 22–32)
Calcium: 8.1 mg/dL — ABNORMAL LOW (ref 8.9–10.3)
Chloride: 101 mmol/L (ref 98–111)
Creatinine, Ser: 2.02 mg/dL — ABNORMAL HIGH (ref 0.61–1.24)
GFR calc Af Amer: 40 mL/min — ABNORMAL LOW (ref >60)
GFR calc non Af Amer: 35 mL/min — ABNORMAL LOW (ref >60)
Glucose, Bld: 102 mg/dL — ABNORMAL HIGH (ref 70–99)
Potassium: 4.5 mmol/L (ref 3.5–5.1)
Sodium: 139 mmol/L (ref 135–145)
Total Bilirubin: 0.5 mg/dL (ref 0.3–1.2)
Total Protein: 6 g/dL — ABNORMAL LOW (ref 6.5–8.1)

## 2020-05-11 LAB — VOLATILES,BLD-ACETONE,ETHANOL,ISOPROP,METHANOL
Acetone, blood: <0.01 g/dL (ref 0.000–0.010)
Ethanol, blood: <0.01 g/dL (ref 0.000–0.010)
Isopropanol, blood: <0.01 g/dL (ref 0.000–0.010)
Methanol, blood: <0.01 g/dL (ref 0.000–0.010)

## 2020-05-11 LAB — DRUG PROFILE, UR, 9 DRUGS (LABCORP)
Amphetamines, Urine: NEGATIVE ng/mL
Barbiturate, Ur: NEGATIVE ng/mL
Benzodiazepine Quant, Ur: NEGATIVE ng/mL
Cannabinoid Quant, Ur: NEGATIVE ng/mL
Cocaine (Metab.): NEGATIVE ng/mL
Methadone Screen, Urine: NEGATIVE ng/mL
Opiate Quant, Ur: NEGATIVE ng/mL
Phencyclidine, Ur: NEGATIVE ng/mL
Propoxyphene, Urine: NEGATIVE ng/mL

## 2020-05-11 LAB — CBC
HCT: 35.8 % — ABNORMAL LOW (ref 39.0–52.0)
Hemoglobin: 11.9 g/dL — ABNORMAL LOW (ref 13.0–17.0)
MCH: 33.6 pg (ref 26.0–34.0)
MCHC: 33.2 g/dL (ref 30.0–36.0)
MCV: 101.1 fL — ABNORMAL HIGH (ref 80.0–100.0)
Platelets: 238 10*3/uL (ref 150–400)
RBC: 3.54 MIL/uL — ABNORMAL LOW (ref 4.22–5.81)
RDW: 13.2 % (ref 11.5–15.5)
WBC: 8.8 10*3/uL (ref 4.0–10.5)
nRBC: 0 % (ref 0.0–0.2)

## 2020-05-11 LAB — ETHYLENE GLYCOL: Ethylene Glycol Lvl: <5 mg/dL

## 2020-05-11 NOTE — Plan of Care (Signed)
  Problem: Acute Rehab PT Goals(only PT should resolve) Goal: Pt Will Go Supine/Side To Sit Flowsheets (Taken 05/11/2020 1155) Pt will go Supine/Side to Sit: with supervision Goal: Patient Will Transfer Sit To/From Stand Flowsheets (Taken 05/11/2020 1155) Patient will transfer sit to/from stand: with supervision Goal: Pt Will Transfer Bed To Chair/Chair To Bed Flowsheets (Taken 05/11/2020 1155) Pt will Transfer Bed to Chair/Chair to Bed: with supervision Goal: Pt Will Ambulate Flowsheets (Taken 05/11/2020 1155) Pt will Ambulate:  75 feet  with minimal assist  with min guard assist  with cane   11:56 AM, 05/11/20 Lonell Grandchild, MPT Physical Therapist with [facility] [phone] office [phone] mobile phone

## 2020-05-11 NOTE — Progress Notes (Signed)
Nephrology Progress Note:   Patient ID: OBERT ESPINDOLA, male   DOB: 12/27/1960, 59 y.o.   MRN: [id number]   Subjective:   Had 4.6 liters UOP over 7/14 charted.  Had HD on 7/13 with net positive 737 mL after the treatment.  States he wants to go home and feels better.  He has been on NS at 75/hr - running on my exam.   Review of systems:  Denies shortness of breath or chest pain Denies n/v More alert today and yesterday per nursing Per nursing no agitation today    O:BP 98/65   Pulse 88   Temp 98.3 F (36.8 C) (Axillary)   Resp 19   Ht 5\' 7"  (1.702 m)   Wt 76.7 kg   SpO2 95%   BMI 26.48 kg/m   Intake/Output Summary (Last 24 hours) at 05/11/2020 0926 Last data filed at 05/11/2020 0500 Gross per 24 hour  Intake 1633.68 ml  Output 2100 ml  Net -466.32 ml   Intake/Output: I/O last 3 completed shifts: In: 2120.7 [I.V.:2120.7] Out: 7902 [Urine:6200]  Intake/Output this shift:  No intake/output data recorded. Weight change: -2.3 kg   Gen: adult male in bed in NAD  CVS: S1S2; no rub Resp: clear and unlabored room air Abd:  soft, NT/ND Ext: no edema no cyanosis or clubbing Neuro - conversant and oriented to person and year pysch - no current agitation or anxiety  Access - nontunneled RIJ in place   Recent Labs  Lab 05/09/20 1138 05/09/20 1427 05/09/20 1857 05/10/20 0447 05/11/20 0344  NA 134* 135 135 139 139  139  K >7.5* 6.2* 3.7 4.8 4.5  4.5  CL 105 110 104 102 101  100  CO2 14* 14* 21* 26 29  29   GLUCOSE 122* 93 95 89 102*  103*  BUN 124* 120* 58* 55* 44*  44*  CREATININE 9.66* 8.77* 4.27* 3.91* 2.02*  2.06*  ALBUMIN 3.7  --  2.7* 3.3* 3.1*  3.1*  CALCIUM 9.6 8.4* 8.0* 8.7* 8.1*  8.0*  PHOS  --   --  2.6 3.1 2.8  AST 14*  --  15  --  15  ALT 11  --  9  --  12   Liver Function Tests: Recent Labs  Lab 05/09/20 1138 05/09/20 1138 05/09/20 1857 05/10/20 0447 05/11/20 0344  AST 14*  --  15  --  15  ALT 11  --  9  --  12  ALKPHOS 101  --  73  --  83   BILITOT 0.5  --  0.9  --  0.5  PROT 7.2  --  5.2*  --  6.0*  ALBUMIN 3.7   < > 2.7* 3.3* 3.1*  3.1*   < > = values in this interval not displayed.   No results for input(s): LIPASE, AMYLASE in the last 168 hours. Recent Labs  Lab 05/09/20 1139  AMMONIA 9   CBC: Recent Labs  Lab 05/09/20 1138 05/09/20 1138 05/09/20 1857 05/10/20 0447 05/11/20 0344  WBC 13.3*   < > 7.2 8.8 8.8  NEUTROABS 11.2*  --   --   --   --   HGB 12.4*   < > 9.4* 12.0* 11.9*  HCT 37.9*   < > 28.6* 34.9* 35.8*  MCV 101.3*  --  99.7 98.3 101.1*  PLT 281   < > 211 251 238   < > = values in this interval not displayed.   Cardiac Enzymes: No  results for input(s): CKTOTAL, CKMB, CKMBINDEX, TROPONINI in the last 168 hours. CBG: Recent Labs  Lab 05/09/20 1123  GLUCAP 234*    Iron Studies: No results for input(s): IRON, TIBC, TRANSFERRIN, FERRITIN in the last 72 hours. Studies/Results: CT HEAD WO CONTRAST  Result Date: 05/09/2020 CLINICAL DATA:  59 year old male with neurologic deficit. EXAM: CT HEAD WITHOUT CONTRAST TECHNIQUE: Contiguous axial images were obtained from the base of the skull through the vertex without intravenous contrast. COMPARISON:  Head CT dated 09/23/2019. FINDINGS: Evaluation of this exam is limited due to motion artifact. Brain: Large right MCA territory old infarct and encephalomalacia as well as additional areas of old infarct involving the occipital lobes. There is mild age-related atrophy and chronic microvascular ischemic changes. There is no acute intracranial hemorrhage. No mass effect or midline shift. No extra-axial fluid collection. Vascular: No hyperdense vessel or unexpected calcification. Skull: Normal. Negative for fracture or focal lesion. Sinuses/Orbits: No acute finding. Other: None IMPRESSION: 1. No acute intracranial hemorrhage. 2. Age-related atrophy and chronic microvascular ischemic changes. Multiple old infarcts. Electronically Signed   By: Anner Crete M.D.    On: 05/09/2020 21:50   US RENAL  Result Date: 05/09/2020 CLINICAL DATA:  Acute kidney injury. EXAM: RENAL / URINARY TRACT ULTRASOUND COMPLETE COMPARISON:  Abdominal ultrasound dated November 03, 2010. FINDINGS: Right Kidney: Renal measurements: 11.6 x 5.0 x 5.6 cm = volume: 172 mL . Echogenicity within normal limits. No mass or hydronephrosis visualized. Left Kidney: Renal measurements: 12.1 x 6.3 x 6.4 cm = volume: 256 mL. Echogenicity within normal limits. No mass or hydronephrosis visualized. Bladder: Decompressed by Foley catheter. Other: None. IMPRESSION: 1. Normal renal ultrasound. Electronically Signed   By: Titus Dubin M.D.   On: 05/09/2020 16:10   DG Chest Port 1 View  Result Date: 05/09/2020 CLINICAL DATA:  Confusion, weakness EXAM: PORTABLE CHEST 1 VIEW COMPARISON:  09/23/2019 FINDINGS: The heart size and mediastinal contours are within normal limits. No focal airspace consolidation, pleural effusion, or pneumothorax. The visualized skeletal structures are unremarkable. IMPRESSION: No active disease. Electronically Signed   By: Davina Poke D.O.   On: 05/09/2020 11:44   DG Chest Port 1V same Day  Result Date: 05/09/2020 CLINICAL DATA:  Dialysis catheter placement. EXAM: PORTABLE CHEST 1 VIEW COMPARISON:  05/09/2020 at 11:32 a.m. FINDINGS: A right jugular catheter has been placed and terminates over the mid SVC. The cardiomediastinal silhouette is unchanged with normal heart size. No confluent airspace opacity, edema, pleural effusion, pneumothorax is identified. Postsurgical changes are noted in the right lung apex. IMPRESSION: Right jugular catheter placement as above. Electronically Signed   By: Logan Bores M.D.   On: 05/09/2020 16:41   . aspirin EC  81 mg Oral Daily  . atorvastatin  40 mg Oral q1800  . Chlorhexidine Gluconate Cloth  6 each Topical Q0600  . folic acid  1 mg Oral Daily  . LORazepam  0-4 mg Intravenous Q6H   Followed by  . LORazepam  0-4 mg Intravenous Q12H  .  multivitamin with minerals  1 tablet Oral Daily  . nicotine  21 mg Transdermal Daily  . thiamine  100 mg Oral Daily   Or  . thiamine  100 mg Intravenous Daily    BMET    Component Value Date/Time   NA 139 05/11/2020 0344   NA 139 05/11/2020 0344   NA 134 (A) 01/29/2017 0000   K 4.5 05/11/2020 0344   K 4.5 05/11/2020 0344   CL 100 05/11/2020  0344   CL 101 05/11/2020 0344   CO2 29 05/11/2020 0344   CO2 29 05/11/2020 0344   GLUCOSE 103 (H) 05/11/2020 0344   GLUCOSE 102 (H) 05/11/2020 0344   BUN 44 (H) 05/11/2020 0344   BUN 44 (H) 05/11/2020 0344   BUN 28 (A) 01/29/2017 0000   CREATININE 2.06 (H) 05/11/2020 0344   CREATININE 2.02 (H) 05/11/2020 0344   CREATININE 1.01 04/03/2018 1528   CALCIUM 8.0 (L) 05/11/2020 0344   CALCIUM 8.1 (L) 05/11/2020 0344   GFRNONAA 34 (L) 05/11/2020 0344   GFRNONAA 35 (L) 05/11/2020 0344   GFRAA 39 (L) 05/11/2020 0344   GFRAA 40 (L) 05/11/2020 0344   CBC    Component Value Date/Time   WBC 8.8 05/11/2020 0344   RBC 3.54 (L) 05/11/2020 0344   HGB 11.9 (L) 05/11/2020 0344   HCT 35.8 (L) 05/11/2020 0344   PLT 238 05/11/2020 0344   MCV 101.1 (H) 05/11/2020 0344   MCH 33.6 05/11/2020 0344   MCHC 33.2 05/11/2020 0344   RDW 13.2 05/11/2020 0344   LYMPHSABS 1.1 05/09/2020 1138   MONOABS 0.9 05/09/2020 1138   EOSABS 0.1 05/09/2020 1138   BASOSABS 0.0 05/09/2020 1138     Assessment/Plan: 1.  AKI- possible ischemic ATN in setting of hypotension with concomitant ACE-inhibitor as well as NSAIDs and diuretics.  Possible ingestion, although he denies it at this time. Unfortunately we do not have any labs since March 2021.  The only new medication started was chlorthalidone on 04/21/20 but no labs were drawn due to phlebotomist was not able to get any blood.  He has received multiple liters of NS in the ED and is nonoliguric.  Did have foley as well.  S/p emergent dialysis in the ED 05/09/20 due to hyperkalemia.  HD catheter placed by Dr. Elsworth Soho in the ED  05/09/20.  Renal US was unremarkable.  UA no RBC and neg protein. 1. tox screen still pending including ethylene glycol and methanol (although he denies anything and family supports this). UDS neg 2. Continue NS at 75 /hr - discontinued order for bicarb 3. Continue the foley please - spoke with RN 4. Continue nontunneled HD catheter - nephrology to assess removal on 7/16 5. Hold off on further dialysis for now given marked improvement of UOP 2. Hyperkalemia- given IV insulin/D5, IV bicarb, and IV calcium.   1. Resolved with HD 3. Metabolic acidosis- given IV bicarb x 2 and isotonic bicarb drip.  Lactate normal at 1.1.   4. Hypotension/sepsis/hypothermia- unclear etiology.  Cultures drawn and blood culture pending - NGTD. Urine cx negative.  s/p Abx per primary.  Appreciate Dr. Bari Mantis assistance. 5. Acute metabolic encephlopathy- unclear etiology.  Workup underway.   CT scan negative, NH3 WNL.   Claudia Desanctis, MD 05/11/2020 9:40 AM

## 2020-05-11 NOTE — Evaluation (Signed)
Physical Therapy Evaluation Patient Details Name: [name] MRN: [id number] DOB: 1961-05-15 Today's Date: 05/11/2020   History of Present Illness  [name] is a 59 y.o. male with medical history significant of with past medical history significant for old ischemic CVA (with left hemiparesis), hypertension, major depression-anxiety, alcoholism, COPD and tobacco abuse; who was brought to the hospital secondary to increased confusion, combativeness and altered mental status. Daughter reported to EDP that he has been struggling with severe depression and has not been taking his psychiatry medications properly at home and has not been eating or drinking adequately. There has not been any concerns of fever, chills, nausea, vomiting, abdominal pain, dysuria, hematuria, melena, hematochezia, new focal deficits, headaches or any other complaints.    Clinical Impression  Patient demonstrates slow labored movement for sitting up at bedside requiring Min assist to complete supine to sitting due to weakness, unsteady on feet and limited to walking to commode to have a bowel movement, afterwards, patient c/o fatigue and unable to walk out of room mostly due to hunger/generalized weakness - RN notified.  Patient tolerated sitting up in chair after therapy.  Patient will benefit from continued physical therapy in hospital and recommended venue below to increase strength, balance, endurance for safe ADLs and gait.      Follow Up Recommendations SNF;Supervision for mobility/OOB;Supervision - Intermittent    Equipment Recommendations  None recommended by PT    Recommendations for Other Services       Precautions / Restrictions Precautions Precautions: Fall Restrictions Weight Bearing Restrictions: No      Mobility  Bed Mobility Overal bed mobility: Needs Assistance Bed Mobility: Supine to Sit     Supine to sit: Min assist     General bed mobility comments: slow labored movement with  assistance to pull self to sitting  Transfers Overall transfer level: Needs assistance Equipment used: 1 person hand held assist;None Transfers: Sit to/from [facility] Sit to Stand: Min assist Stand pivot transfers: Min assist       General transfer comment: unsteady on feet, labored movement, increased time  Ambulation/Gait Ambulation/Gait assistance: Min assist Gait Distance (Feet): 12 Feet Assistive device: 1 person hand held assist;None Gait Pattern/deviations: Decreased step length - right;Decreased step length - left;Decreased stance time - left;Decreased stride length Gait velocity: decreased   General Gait Details: unsteady slow labored cadence with mild diffiuclty advancing LLE due to weakness  Stairs            Wheelchair Mobility    Modified Rankin (Stroke Patients Only)       Balance Overall balance assessment: Needs assistance Sitting-balance support: Feet supported;No upper extremity supported Sitting balance-Leahy Scale: Fair Sitting balance - Comments: fair/good seated at EOB   Standing balance support: During functional activity;No upper extremity supported Standing balance-Leahy Scale: Poor Standing balance comment: fair/poor without AD                             Pertinent Vitals/Pain Pain Assessment: Faces Faces Pain Scale: Hurts little more Pain Location: "all over body", per patient Pain Descriptors / Indicators: Sore Pain Intervention(s): Limited activity within patient's tolerance;Monitored during session    Home Living Family/patient expects to be discharged to:: Private residence Living Arrangements: Alone Available Help at Discharge: Family;Available PRN/intermittently Type of Home: House Home Access: Stairs to enter Entrance Stairs-Rails: Right;Left;Can reach both Entrance Stairs-Number of Steps: 5 Home Layout: One level Home Equipment: Cane - single  point;Shower seat      Prior Function            Comments: household and short distanced community ambulator without AD, "per patient"     Hand Dominance   Dominant Hand: Right    Extremity/Trunk Assessment   Upper Extremity Assessment Upper Extremity Assessment: Generalized weakness;RUE deficits/detail;LUE deficits/detail RUE Deficits / Details: grossly -4/5 RUE Sensation: WNL RUE Coordination: WNL LUE Deficits / Details: grossly 2+/5 LUE Sensation: decreased proprioception LUE Coordination: decreased gross motor;decreased fine motor    Lower Extremity Assessment Lower Extremity Assessment: Generalized weakness;RLE deficits/detail;LLE deficits/detail RLE Deficits / Details: grossly 4+/5 RLE Sensation: WNL RLE Coordination: WNL LLE Deficits / Details: grossly 3+/5 except ankle dorsiflexion grossly -3/5 LLE Sensation: decreased proprioception LLE Coordination: decreased fine motor    Cervical / Trunk Assessment Cervical / Trunk Assessment: Normal  Communication   Communication: No difficulties  Cognition Arousal/Alertness: Awake/alert Behavior During Therapy: WFL for tasks assessed/performed Overall Cognitive Status: Within Functional Limits for tasks assessed                                        General Comments      Exercises     Assessment/Plan    PT Assessment Patient needs continued PT services  PT Problem List Decreased strength;Decreased activity tolerance;Decreased balance;Decreased mobility       PT Treatment Interventions Balance training;Gait training;Stair training;Functional mobility training;Therapeutic activities;Therapeutic exercise;Patient/family education    PT Goals (Current goals can be found in the Care Plan section)  Acute Rehab PT Goals Patient Stated Goal: return home with family to assist PT Goal Formulation: With patient Time For Goal Achievement: 05/25/20 Potential to Achieve Goals: Good    Frequency Min 3X/week   Barriers to discharge         Co-evaluation               AM-PAC PT "6 Clicks" Mobility  Outcome Measure Help needed turning from your back to your side while in a flat bed without using bedrails?: None Help needed moving from lying on your back to sitting on the side of a flat bed without using bedrails?: A Little Help needed moving to and from a bed to a chair (including a wheelchair)?: A Little Help needed standing up from a chair using your arms (e.g., wheelchair or bedside chair)?: A Little Help needed to walk in hospital room?: A Lot Help needed climbing 3-5 steps with a railing? : A Lot 6 Click Score: 17    End of Session   Activity Tolerance: Patient tolerated treatment well;Patient limited by fatigue Patient left: in chair;with call bell/phone within reach Nurse Communication: Mobility status PT Visit Diagnosis: Unsteadiness on feet (R26.81);Other abnormalities of gait and mobility (R26.89);Muscle weakness (generalized) (M62.81)    Time: 4967-5916 PT Time Calculation (min) (ACUTE ONLY): 34 min   Charges:   PT Evaluation $PT Eval Moderate Complexity: 1 Mod PT Treatments $Therapeutic Activity: 23-37 mins        11:54 AM, 05/11/20 Lonell Grandchild, MPT Physical Therapist with [facility] [phone] office [phone] mobile phone

## 2020-05-11 NOTE — Progress Notes (Signed)
Patient Demographics:    [name], is a 59 y.o. male, DOB - 1961/07/30, HMC:[id number]  Admit date - 05/09/2020   Admitting Physician Barton Dubois, MD  Outpatient Primary MD for the patient is McGowen, Adrian Blackwater, MD  LOS - 2   Chief Complaint  Patient presents with  . Altered Mental Status        Subjective:    [name] today has no fevers, no emesis,  No chest pain,   - -More alert, more coherent, much less confused -Urine output remains adequate  Assessment  & Plan :    Principal Problem:   ARF (acute renal failure) (HCC) Active Problems:   Tobacco abuse   COPD (chronic obstructive pulmonary disease) (HCC)   History of ischemic stroke   Essential hypertension   Acute metabolic encephalopathy   Alcohol abuse  Brief Summary:- [name] with past medical history multiple prior CVA with residual left hemiparesis, HTN, depressive disorder with anxiety, COPD and tobacco abuse admitted on 05/09/2020 with AKI, hyperkalemia and hypotension with metabolic acidosis required emergent hemodialysis on 05/09/2020  A/p 1)AKI----acute kidney injury --worsening renal function was due to presumed ATN in the setting of hypotension compounded by concomitant ACE inhibitor as well as NSAID and diuretic use   --creatinine on admission= 9.66 , baseline creatinine = reportedly WNL previously   , creatinine is now=2.0 ,  renally adjust medications, avoid nephrotoxic agents / dehydration  / hypotension -Status post emergency hemodialysis on 05/09/2020 -Renal ultrasound without obstructive uropathy -UDS negative -Additional Tox screen pending -Metabolic acidosis-resolved  2)Hyperkalemia--secondary to #1 above, potassium is  down to 4.5 from 7.5 after emergent hemodialysis on 05/09/2020  3)HTN--hypotension  resolved, continue to hold ACE and diuretics  4)Acute metabolic encephalopathy--- multifactorial but mostly  due to #1 above -CT head w/o acute findings -UDS negative -Improving with improvement in renal function  5)COPD/Tobacco Abuse--- continue bronchodilators, nicotine patch as ordered  6)H/o Prior CVA--residual left hemiparesis, continue aspirin and statins  7) B12 deficiency--- give IM B12 injection  8)Persistent Hypotension--- in a patient with history of hypertension on anti- hypertensives PTA --check am cortisol  Disposition/Need for in-Hospital Stay- patient unable to be discharged at this time due to --- acute kidney injury and hyperkalemia requiring hemodialysis, still requiring further IV hydration awaiting further improvement in renal function  Status is: Inpatient  Remains inpatient appropriate because:acute kidney injury and hyperkalemia requiring hemodialysis, still requiring further IV hydration awaiting further improvement in renal function   Disposition: The patient is from: Home              Anticipated d/c is to: SNF              Anticipated d/c date is: > 3 days              Patient currently is not medically stable to d/c. Barriers: Not Clinically Stable- -acute kidney injury and hyperkalemia requiring hemodialysis, still requiring further IV hydration awaiting further improvement in renal function  Code Status : full code  Family Communication:   -- Discussed with  patient's daughter [name]  Consults  : Nephrology and PCCM  DVT Prophylaxis  :   - SCDs  Lab Results  Component Value Date   PLT 238  05/11/2020    Inpatient Medications  Scheduled Meds: . aspirin EC  81 mg Oral Daily  . atorvastatin  40 mg Oral q1800  . Chlorhexidine Gluconate Cloth  6 each Topical Q0600  . folic acid  1 mg Oral Daily  . LORazepam  0-4 mg Intravenous Q6H   Followed by  . LORazepam  0-4 mg Intravenous Q12H  . multivitamin with minerals  1 tablet Oral Daily  . nicotine  21 mg Transdermal Daily  . thiamine  100 mg Oral Daily   Or  . thiamine  100 mg Intravenous Daily    Continuous Infusions: . sodium chloride    . sodium chloride    . sodium chloride 75 mL/hr at 05/11/20 0458   PRN Meds:.sodium chloride, sodium chloride, alteplase, haloperidol lactate, heparin, LORazepam **OR** LORazepam, ondansetron **OR** ondansetron (ZOFRAN) IV    Anti-infectives (From admission, onward)   Start     Dose/Rate Route Frequency Ordered Stop   05/09/20 1200  vancomycin (VANCOREADY) IVPB 1500 mg/300 mL        1,500 mg 150 mL/hr over 120 Minutes Intravenous  Once 05/09/20 1156 05/09/20 1522   05/09/20 1130  vancomycin (VANCOCIN) IVPB 1000 mg/200 mL premix  Status:  Discontinued        1,000 mg 200 mL/hr over 60 Minutes Intravenous  Once 05/09/20 1120 05/09/20 1156   05/09/20 1130  piperacillin-tazobactam (ZOSYN) IVPB 3.375 g        3.375 g 100 mL/hr over 30 Minutes Intravenous  Once 05/09/20 1120 05/09/20 1250        Objective:   Vitals:   05/11/20 0800 05/11/20 0837 05/11/20 0900 05/11/20 1000  BP:  (!) 81/53 98/65 (!) 95/52  Pulse: 84 81 88 88  Resp: 10 17 19 14   Temp:      TempSrc:      SpO2: 94% 93% 95% 94%  Weight:      Height:        Wt Readings from Last 3 Encounters:  05/11/20 76.7 kg  04/21/20 79.8 kg  03/24/20 79.9 kg     Intake/Output Summary (Last 24 hours) at 05/11/2020 1152 Last data filed at 05/11/2020 0500 Gross per 24 hour  Intake 794.31 ml  Output 2100 ml  Net -1305.69 ml     Physical Exam  Gen:-More alert, more coherent, more cooperative HEENT:- [place].AT, No sclera icterus Nose- [place] 2 L/min Neck-Supple Neck,No JVD,.  Right IJ HD catheter Lungs-diminished in bases, no wheezing  CV- S1, S2 normal, regular  Abd-  +ve B.Sounds, Abd Soft, No tenderness,    Extremity/Skin:- No  edema, pedal pulses present  NeuroPsych-oriented x3, cooperative, patient with left-sided hemiparesis from prior stroke, no new focal deficits affect appropriate, GU-Foley catheter with clear urine  Data Review:   Micro Results Recent Results (from  the past 240 hour(s))  Urine culture     Status: None   Collection Time: 05/09/20 11:34 AM   Specimen: In/Out Cath Urine  Result Value Ref Range Status   Specimen Description   Final    IN/OUT CATH URINE Performed at [facility], [address], [place], [place] [address]    Special Requests   Final    NONE Performed at [facility], [address] [place], [place] [address]    Culture   Final    NO GROWTH Performed at [facility], [place] [address], [place],  [address]    Report Status 05/10/2020 FINAL  Final  Blood Culture (routine  x 2)     Status: None (Preliminary result)   Collection Time: 05/09/20 11:40 AM   Specimen: BLOOD RIGHT FOREARM  Result Value Ref Range Status   Specimen Description BLOOD RIGHT FOREARM DRAWN BY RN TBV  Final   Special Requests   Final    BOTTLES DRAWN AEROBIC AND ANAEROBIC Blood Culture adequate volume   Culture   Final    NO GROWTH 2 DAYS Performed at [facility], [address] [place], [place] [address]    Report Status PENDING  Incomplete  Blood Culture (routine x 2)     Status: None (Preliminary result)   Collection Time: 05/09/20 11:45 AM   Specimen: BLOOD  Result Value Ref Range Status   Specimen Description BLOOD RIGHT ANTECUBITAL  Final   Special Requests   Final    BOTTLES DRAWN AEROBIC AND ANAEROBIC Blood Culture adequate volume   Culture   Final    NO GROWTH 2 DAYS Performed at [facility], [address], [place], [place] [address]    Report Status PENDING  Incomplete  SARS Coronavirus 2 by RT PCR (hospital order, performed in [place] hospital lab) Nasopharyngeal Nasopharyngeal Swab     Status: None   Collection Time: 05/09/20  3:25 PM   Specimen: Nasopharyngeal Swab  Result Value Ref Range Status   SARS Coronavirus 2 NEGATIVE NEGATIVE Final    Comment: (NOTE) SARS-CoV-2 target nucleic acids are NOT DETECTED.  The SARS-CoV-2 RNA is generally detectable in upper and lower respiratory specimens during the  acute phase of infection. The lowest concentration of SARS-CoV-2 viral copies this assay can detect is 250 copies / mL. A negative result does not preclude SARS-CoV-2 infection and should not be used as the sole basis for treatment or other patient management decisions.  A negative result may occur with improper specimen collection / handling, submission of specimen other than nasopharyngeal swab, presence of viral mutation(s) within the areas targeted by this assay, and inadequate number of viral copies (<250 copies / mL). A negative result must be combined with clinical observations, patient history, and epidemiological information.  Fact Sheet for Patients:   StrictlyIdeas.no  Fact Sheet for Healthcare Providers: BankingDealers.co.za  This test is not yet approved or  cleared by the [place] FDA and has been authorized for detection and/or diagnosis of SARS-CoV-2 by FDA under an Emergency Use Authorization (EUA).  This EUA will remain in effect (meaning this test can be used) for the duration of the COVID-19 declaration under Section 564(b)(1) of the Act, 21 U.S.C. section 360bbb-3(b)(1), unless the authorization is terminated or revoked sooner.  Performed at [facility], [address], [place], [place] [address]   MRSA PCR Screening     Status: None   Collection Time: 05/09/20  5:04 PM   Specimen: Nasal Mucosa; Nasopharyngeal  Result Value Ref Range Status   MRSA by PCR NEGATIVE NEGATIVE Final    Comment:        The GeneXpert MRSA Assay (FDA approved for NASAL specimens only), is one component of a comprehensive MRSA colonization surveillance program. It is not intended to diagnose MRSA infection nor to guide or monitor treatment for MRSA infections. Performed at [facility], [address], [place], [place] [address]     Radiology Reports CT HEAD WO CONTRAST  Result Date: 05/09/2020 CLINICAL DATA:  59 year old  male with neurologic deficit. EXAM: CT HEAD WITHOUT CONTRAST TECHNIQUE: Contiguous axial images were obtained from the base of the skull through the vertex  without intravenous contrast. COMPARISON:  Head CT dated 09/23/2019. FINDINGS: Evaluation of this exam is limited due to motion artifact. Brain: Large right MCA territory old infarct and encephalomalacia as well as additional areas of old infarct involving the occipital lobes. There is mild age-related atrophy and chronic microvascular ischemic changes. There is no acute intracranial hemorrhage. No mass effect or midline shift. No extra-axial fluid collection. Vascular: No hyperdense vessel or unexpected calcification. Skull: Normal. Negative for fracture or focal lesion. Sinuses/Orbits: No acute finding. Other: None IMPRESSION: 1. No acute intracranial hemorrhage. 2. Age-related atrophy and chronic microvascular ischemic changes. Multiple old infarcts. Electronically Signed   By: Anner Crete M.D.   On: 05/09/2020 21:50   US RENAL  Result Date: 05/09/2020 CLINICAL DATA:  Acute kidney injury. EXAM: RENAL / URINARY TRACT ULTRASOUND COMPLETE COMPARISON:  Abdominal ultrasound dated November 03, 2010. FINDINGS: Right Kidney: Renal measurements: 11.6 x 5.0 x 5.6 cm = volume: 172 mL . Echogenicity within normal limits. No mass or hydronephrosis visualized. Left Kidney: Renal measurements: 12.1 x 6.3 x 6.4 cm = volume: 256 mL. Echogenicity within normal limits. No mass or hydronephrosis visualized. Bladder: Decompressed by Foley catheter. Other: None. IMPRESSION: 1. Normal renal ultrasound. Electronically Signed   By: Titus Dubin M.D.   On: 05/09/2020 16:10   DG Chest Port 1 View  Result Date: 05/09/2020 CLINICAL DATA:  Confusion, weakness EXAM: PORTABLE CHEST 1 VIEW COMPARISON:  09/23/2019 FINDINGS: The heart size and mediastinal contours are within normal limits. No focal airspace consolidation, pleural effusion, or pneumothorax. The visualized skeletal  structures are unremarkable. IMPRESSION: No active disease. Electronically Signed   By: Davina Poke D.O.   On: 05/09/2020 11:44   DG Chest Port 1V same Day  Result Date: 05/09/2020 CLINICAL DATA:  Dialysis catheter placement. EXAM: PORTABLE CHEST 1 VIEW COMPARISON:  05/09/2020 at 11:32 a.m. FINDINGS: A right jugular catheter has been placed and terminates over the mid SVC. The cardiomediastinal silhouette is unchanged with normal heart size. No confluent airspace opacity, edema, pleural effusion, pneumothorax is identified. Postsurgical changes are noted in the right lung apex. IMPRESSION: Right jugular catheter placement as above. Electronically Signed   By: Logan Bores M.D.   On: 05/09/2020 16:41     CBC Recent Labs  Lab 05/09/20 1138 05/09/20 1857 05/10/20 0447 05/11/20 0344  WBC 13.3* 7.2 8.8 8.8  HGB 12.4* 9.4* 12.0* 11.9*  HCT 37.9* 28.6* 34.9* 35.8*  PLT 281 211 251 238  MCV 101.3* 99.7 98.3 101.1*  MCH 33.2 32.8 33.8 33.6  MCHC 32.7 32.9 34.4 33.2  RDW 13.4 13.2 13.0 13.2  LYMPHSABS 1.1  --   --   --   MONOABS 0.9  --   --   --   EOSABS 0.1  --   --   --   BASOSABS 0.0  --   --   --     Chemistries  Recent Labs  Lab 05/09/20 1138 05/09/20 1427 05/09/20 1857 05/10/20 0447 05/11/20 0344  NA 134* 135 135 139 139  139  K >7.5* 6.2* 3.7 4.8 4.5  4.5  CL 105 110 104 102 101  100  CO2 14* 14* 21* 26 29  29   GLUCOSE 122* 93 95 89 102*  103*  BUN 124* 120* 58* 55* 44*  44*  CREATININE 9.66* 8.77* 4.27* 3.91* 2.02*  2.06*  CALCIUM 9.6 8.4* 8.0* 8.7* 8.1*  8.0*  MG  --   --  1.5*  --   --  AST 14*  --  15  --  15  ALT 11  --  9  --  12  ALKPHOS 101  --  73  --  83  BILITOT 0.5  --  0.9  --  0.5   ------------------------------------------------------------------------------------------------------------------ No results for input(s): CHOL, HDL, LDLCALC, TRIG, CHOLHDL, LDLDIRECT in the last 72 hours.  Lab Results  Component Value Date   HGBA1C (H)  11/02/2010    5.8 (NOTE)                                                                       According to the ADA Clinical Practice Recommendations for 2011, when HbA1c is used as a screening test:   >=6.5%   Diagnostic of Diabetes Mellitus           (if abnormal result  is confirmed)  5.7-6.4%   Increased risk of developing Diabetes Mellitus  References:Diagnosis and Classification of Diabetes Mellitus,Diabetes KXFG,1829,93(ZJIRC 1):S62-S69 and Standards of Medical Care in         Diabetes - 2011,Diabetes VELF,8101,75  (Suppl 1):S11-S61.   ------------------------------------------------------------------------------------------------------------------ Recent Labs    05/09/20 1857  TSH 0.778   ------------------------------------------------------------------------------------------------------------------ Recent Labs    05/09/20 1857  VITAMINB12 165*    Coagulation profile Recent Labs  Lab 05/09/20 1138  INR 1.0    No results for input(s): DDIMER in the last 72 hours.  Cardiac Enzymes No results for input(s): CKMB, TROPONINI, MYOGLOBIN in the last 168 hours.  Invalid input(s): CK ------------------------------------------------------------------------------------------------------------------    Component Value Date/Time   BNP 45.0 03/27/2018 2052     Roxan Hockey M.D on 05/11/2020 at 11:52 AM  Go to [email or web address] - for contact info  [facility] - Office  [phone]

## 2020-05-11 NOTE — TOC Initial Note (Addendum)
Transition of Care [facility]) - Initial/Assessment Note    Patient Details  Name: [name] MRN: [id number] Date of Birth: 1961/08/14  [facility]) CM/SW Contact:    Ihor Gully, LCSW Phone Number: 05/11/2020, 4:42 PM  Clinical Narrative:                 Patient from home. Stopped taking psych meds and has been depressed. Admitted for acute metabolic encephalopathy. Spoke with daughter, [name], discussed PT recommendations. Agreeable to SNF for rehab. Discussed choices and ratings. Referral sent to facilities of interest. TOC attempted to interview patient however he was asleep and would not arouse.   Expected Discharge Plan: Skilled Nursing Facility Barriers to Discharge: Continued Medical Work up   Patient Goals and CMS Choice Patient states their goals for this hospitalization and ongoing recovery are:: family wants rehab and home   Choice offered to / list presented to : Adult Children  Expected Discharge Plan and Services Expected Discharge Plan: [place] arrangements for the past 2 months: Single Family Home                                      Prior Living Arrangements/Services Living arrangements for the past 2 months: Single Family Home Lives with:: Self Patient language and need for interpreter reviewed:: Yes Do you feel safe going back to the place where you live?: Yes      Need for Family Participation in Patient Care: Yes (Comment) Care giver support system in place?: Yes (comment) Current home services: DME Criminal Activity/Legal Involvement Pertinent to Current Situation/Hospitalization: No - Comment as needed  Activities of Daily Living Home Assistive Devices/Equipment: Dentures (specify type) ADL Screening (condition at time of admission) Patient's cognitive ability adequate to safely complete daily activities?: No Is the patient deaf or have difficulty hearing?: No Does the patient have difficulty  seeing, even when wearing glasses/contacts?: No Does the patient have difficulty concentrating, remembering, or making decisions?: Yes Patient able to express need for assistance with ADLs?: No Does the patient have difficulty dressing or bathing?: Yes Independently performs ADLs?: No Communication: Independent Dressing (OT): Needs assistance Is this a change from baseline?: Pre-admission baseline Grooming: Needs assistance Is this a change from baseline?: Pre-admission baseline Feeding: Needs assistance Is this a change from baseline?: Pre-admission baseline Bathing: Needs assistance Is this a change from baseline?: Pre-admission baseline Toileting: Needs assistance Is this a change from baseline?: Pre-admission baseline In/Out Bed: Needs assistance Is this a change from baseline?: Pre-admission baseline Walks in Home: Independent Does the patient have difficulty walking or climbing stairs?: No Weakness of Legs: None Weakness of Arms/Hands: None  Permission Sought/Granted Permission sought to share information with : Family Supports    Share Information with NAME: Bevelyn Ngo     Permission granted to share info w Relationship: granddaughter in Sports coach     Emotional Assessment Appearance:: Appears stated age   Affect (typically observed): Unable to Assess Orientation: : Oriented to Self, Oriented to Place, Oriented to  Time, Oriented to Situation Alcohol / Substance Use: Not Applicable Psych Involvement: No (comment)  Admission diagnosis:  ARF (acute renal failure) ([place]) [N17.9] Encounter for dialysis and dialysis catheter care [facility]) [Z99.2] Patient Active Problem List   Diagnosis Date Noted  . ARF (acute renal failure) ([place]) 05/09/2020  . Acute metabolic encephalopathy 40/97/3532  . Alcohol  abuse 05/09/2020  . Orthostatic hypotension   . Multiple falls   . History of CVA with residual deficit   . Syncope 09/23/2019  . AKI (acute kidney injury) ([place])   . Weakness    . Unsteady gait   . History of ischemic stroke   . Hypomagnesemia   . Hypokalemia   . Wernicke's disease   . Essential hypertension   . Tobacco abuse counseling   . Subdural hematoma ([place]) 12/22/2017  . Depression   . Tobacco abuse 11/08/2008  . COPD (chronic obstructive pulmonary disease) ([place]) 11/08/2008  . Pneumothorax 10/19/2008  . PNEUMOTHORAX 10/19/2008   PCP:  Tammi Sou, MD Pharmacy:   [id number] - [place], [address] [place] [address] Phone: [phone] Fax: [phone]     Social Determinants of Health (SDOH) Interventions    Readmission Risk Interventions No flowsheet data found.

## 2020-05-11 NOTE — NC FL2 (Signed)
[place] LEVEL OF CARE SCREENING TOOL     IDENTIFICATION  Patient Name: [name] Birthdate: October 01, 1961 Sex: male Admission Date (Current Location): 05/09/2020  [facility] and [place] Number:  [employer] and Address:  [facility] [address], [place]      Provider Number: [id number]  Attending Physician Name and Address:  Roxan Hockey, MD  Relative Name and Phone Number:       Current Level of Care:   Recommended Level of Care: [place] Prior Approval Number:    Date Approved/Denied:   PASRR Number: [id number]  Discharge Plan: SNF    Current Diagnoses: Patient Active Problem List   Diagnosis Date Noted  . ARF (acute renal failure) ([place]) 05/09/2020  . Acute metabolic encephalopathy 66/03/3015  . Alcohol abuse 05/09/2020  . Orthostatic hypotension   . Multiple falls   . History of CVA with residual deficit   . Syncope 09/23/2019  . AKI (acute kidney injury) ([place])   . Weakness   . Unsteady gait   . History of ischemic stroke   . Hypomagnesemia   . Hypokalemia   . Wernicke's disease   . Essential hypertension   . Tobacco abuse counseling   . Subdural hematoma ([place]) 12/22/2017  . Depression   . Tobacco abuse 11/08/2008  . COPD (chronic obstructive pulmonary disease) ([place]) 11/08/2008  . Pneumothorax 10/19/2008  . PNEUMOTHORAX 10/19/2008    Orientation RESPIRATION BLADDER Height & Weight     Self, Time, Situation, Place  Normal Continent Weight: 169 lb 1.5 oz (76.7 kg) Height:  5\' 7"  (170.2 cm)  BEHAVIORAL SYMPTOMS/MOOD NEUROLOGICAL BOWEL NUTRITION STATUS      Continent Diet (heart healthy)  AMBULATORY STATUS COMMUNICATION OF NEEDS Skin   Limited Assist Verbally Normal                       Personal Care Assistance Level of Assistance  Bathing, Feeding, Dressing Bathing Assistance: Limited assistance Feeding assistance: Independent Dressing Assistance: Limited assistance      Functional Limitations Info  Sight, Speech, Hearing Sight Info: Adequate Hearing Info: Adequate Speech Info: Adequate    SPECIAL CARE FACTORS FREQUENCY  PT (By licensed PT)     PT Frequency: 5x/week              Contractures Contractures Info: Not present    Additional Factors Info  Code Status, Allergies, Psychotropic Code Status Info: Full Code Allergies Info: Ambien Psychotropic Info: Remeron, Desyrel, Seroquel         Current Medications (05/11/2020):  This is the current hospital active medication list Current Facility-Administered Medications  Medication Dose Route Frequency Provider Last Rate Last Admin  . 0.9 %  sodium chloride infusion  100 mL Intravenous PRN Donato Heinz, MD      . 0.9 %  sodium chloride infusion  100 mL Intravenous PRN Donato Heinz, MD      . 0.9 %  sodium chloride infusion   Intravenous Continuous Denton Brick, Courage, MD 75 mL/hr at 05/11/20 1519 Rate Verify at 05/11/20 1519  . alteplase (CATHFLO ACTIVASE) injection 2 mg  2 mg Intracatheter Once PRN Donato Heinz, MD      . aspirin EC tablet 81 mg  81 mg Oral Daily Barton Dubois, MD   81 mg at 05/11/20 0900  . atorvastatin (LIPITOR) tablet 40 mg  40 mg Oral q1800 Barton Dubois, MD   40 mg at 05/10/20 1710  .  Chlorhexidine Gluconate Cloth 2 % PADS 6 each  6 each Topical Q0600 Donato Heinz, MD   6 each at 05/10/20 1531  . folic acid (FOLVITE) tablet 1 mg  1 mg Oral Daily Barton Dubois, MD   1 mg at 05/11/20 0900  . haloperidol lactate (HALDOL) injection 1-4 mg  1-4 mg Intravenous Q3H PRN [name], MD   1 mg at 05/11/20 0145  . heparin injection 1,000 Units  1,000 Units Dialysis PRN Donato Heinz, MD      . LORazepam (ATIVAN) injection 0-4 mg  0-4 mg Intravenous Q6H Barton Dubois, MD   2 mg at 05/10/20 0029   Followed by  . LORazepam (ATIVAN) injection 0-4 mg  0-4 mg Intravenous Q12H Barton Dubois, MD      . LORazepam (ATIVAN) tablet 1-4 mg  1-4 mg Oral Q1H  PRN Barton Dubois, MD       Or  . LORazepam (ATIVAN) injection 1-4 mg  1-4 mg Intravenous Q1H PRN Barton Dubois, MD   1 mg at 05/10/20 0802  . multivitamin with minerals tablet 1 tablet  1 tablet Oral Daily Barton Dubois, MD   1 tablet at 05/11/20 0900  . nicotine (NICODERM CQ - dosed in mg/24 hours) patch 21 mg  21 mg Transdermal Daily Barton Dubois, MD   21 mg at 05/11/20 0900  . ondansetron (ZOFRAN) tablet 4 mg  4 mg Oral Q6H PRN Barton Dubois, MD       Or  . ondansetron [facility]) injection 4 mg  4 mg Intravenous Q6H PRN Barton Dubois, MD      . thiamine tablet 100 mg  100 mg Oral Daily Barton Dubois, MD   100 mg at 05/11/20 0900   Or  . thiamine (B-1) injection 100 mg  100 mg Intravenous Daily Barton Dubois, MD   100 mg at 05/10/20 9485     Discharge Medications: Please see discharge summary for a list of discharge medications.  Relevant Imaging Results:  Relevant Lab Results:   Additional Information SSN [id number] Clydene Pugh, LCSW

## 2020-05-12 DIAGNOSIS — I639 Cerebral infarction, unspecified: Secondary | ICD-10-CM | POA: Diagnosis not present

## 2020-05-12 DIAGNOSIS — Z992 Dependence on renal dialysis: Secondary | ICD-10-CM | POA: Diagnosis not present

## 2020-05-12 DIAGNOSIS — Z7401 Bed confinement status: Secondary | ICD-10-CM | POA: Diagnosis not present

## 2020-05-12 DIAGNOSIS — R2681 Unsteadiness on feet: Secondary | ICD-10-CM | POA: Diagnosis not present

## 2020-05-12 DIAGNOSIS — E875 Hyperkalemia: Secondary | ICD-10-CM | POA: Diagnosis not present

## 2020-05-12 DIAGNOSIS — Z720 Tobacco use: Secondary | ICD-10-CM | POA: Diagnosis not present

## 2020-05-12 DIAGNOSIS — Z743 Need for continuous supervision: Secondary | ICD-10-CM | POA: Diagnosis not present

## 2020-05-12 DIAGNOSIS — E538 Deficiency of other specified B group vitamins: Secondary | ICD-10-CM | POA: Diagnosis not present

## 2020-05-12 DIAGNOSIS — G9341 Metabolic encephalopathy: Secondary | ICD-10-CM | POA: Diagnosis not present

## 2020-05-12 DIAGNOSIS — I959 Hypotension, unspecified: Secondary | ICD-10-CM | POA: Diagnosis not present

## 2020-05-12 DIAGNOSIS — N179 Acute kidney failure, unspecified: Secondary | ICD-10-CM | POA: Diagnosis not present

## 2020-05-12 DIAGNOSIS — R2689 Other abnormalities of gait and mobility: Secondary | ICD-10-CM | POA: Diagnosis not present

## 2020-05-12 DIAGNOSIS — Z9181 History of falling: Secondary | ICD-10-CM | POA: Diagnosis not present

## 2020-05-12 DIAGNOSIS — E872 Acidosis: Secondary | ICD-10-CM | POA: Diagnosis not present

## 2020-05-12 DIAGNOSIS — N170 Acute kidney failure with tubular necrosis: Secondary | ICD-10-CM | POA: Diagnosis not present

## 2020-05-12 DIAGNOSIS — J449 Chronic obstructive pulmonary disease, unspecified: Secondary | ICD-10-CM | POA: Diagnosis not present

## 2020-05-12 DIAGNOSIS — R69 Illness, unspecified: Secondary | ICD-10-CM | POA: Diagnosis not present

## 2020-05-12 DIAGNOSIS — I69354 Hemiplegia and hemiparesis following cerebral infarction affecting left non-dominant side: Secondary | ICD-10-CM | POA: Diagnosis not present

## 2020-05-12 DIAGNOSIS — M6281 Muscle weakness (generalized): Secondary | ICD-10-CM | POA: Diagnosis not present

## 2020-05-12 DIAGNOSIS — I10 Essential (primary) hypertension: Secondary | ICD-10-CM | POA: Diagnosis not present

## 2020-05-12 DIAGNOSIS — F1010 Alcohol abuse, uncomplicated: Secondary | ICD-10-CM | POA: Diagnosis not present

## 2020-05-12 DIAGNOSIS — R5381 Other malaise: Secondary | ICD-10-CM | POA: Diagnosis not present

## 2020-05-12 DIAGNOSIS — M24532 Contracture, left wrist: Secondary | ICD-10-CM | POA: Diagnosis not present

## 2020-05-12 LAB — RENAL FUNCTION PANEL
Albumin: 2.8 g/dL — ABNORMAL LOW (ref 3.5–5.0)
Anion gap: 8 (ref 5–15)
BUN: 29 mg/dL — ABNORMAL HIGH (ref 8–23)
CO2: 27 mmol/L (ref 22–32)
Calcium: 7.7 mg/dL — ABNORMAL LOW (ref 8.9–10.3)
Chloride: 103 mmol/L (ref 98–111)
Creatinine, Ser: 1.29 mg/dL — ABNORMAL HIGH (ref 0.61–1.24)
GFR calc Af Amer: >60 mL/min (ref >60)
GFR calc non Af Amer: 59 mL/min — ABNORMAL LOW (ref >60)
Glucose, Bld: 130 mg/dL — ABNORMAL HIGH (ref 70–99)
Phosphorus: 2.5 mg/dL (ref 2.5–4.6)
Potassium: 4.2 mmol/L (ref 3.5–5.1)
Sodium: 138 mmol/L (ref 135–145)

## 2020-05-12 LAB — CBC
HCT: 33.2 % — ABNORMAL LOW (ref 39.0–52.0)
Hemoglobin: 11 g/dL — ABNORMAL LOW (ref 13.0–17.0)
MCH: 33.6 pg (ref 26.0–34.0)
MCHC: 33.1 g/dL (ref 30.0–36.0)
MCV: 101.5 fL — ABNORMAL HIGH (ref 80.0–100.0)
Platelets: 231 10*3/uL (ref 150–400)
RBC: 3.27 MIL/uL — ABNORMAL LOW (ref 4.22–5.81)
RDW: 12.5 % (ref 11.5–15.5)
WBC: 8.6 10*3/uL (ref 4.0–10.5)
nRBC: 0 % (ref 0.0–0.2)

## 2020-05-12 LAB — CORTISOL-AM, BLOOD: Cortisol - AM: 7.6 ug/dL (ref 6.7–22.6)

## 2020-05-12 MED ORDER — ASPIRIN 81 MG PO TBEC: 81.0000 mg | DELAYED_RELEASE_TABLET | Freq: Every day | ORAL | 0 refills | Status: DC

## 2020-05-12 MED ORDER — QUETIAPINE FUMARATE 200 MG PO TABS: ORAL_TABLET | ORAL | 3 refills | Status: DC

## 2020-05-12 MED ORDER — ONDANSETRON HCL 4 MG PO TABS: 4.0000 mg | ORAL_TABLET | Freq: Four times a day (QID) | ORAL | 0 refills | Status: DC | PRN

## 2020-05-12 MED ORDER — NICOTINE 21 MG/24HR TD PT24: 21.0000 mg | MEDICATED_PATCH | Freq: Every day | TRANSDERMAL | 0 refills | Status: DC

## 2020-05-12 MED ORDER — CYANOCOBALAMIN 1000 MCG/ML IJ SOLN
1000.0000 ug | INTRAMUSCULAR | 2 refills | Status: DC
Start: 2020-05-12 — End: 2020-12-28

## 2020-05-12 MED ORDER — ACETAMINOPHEN 325 MG PO TABS
650.0000 mg | ORAL_TABLET | Freq: Four times a day (QID) | ORAL | Status: DC | PRN
  Administered 2020-05-12: 650 mg via ORAL
  Filled 2020-05-12: qty 2

## 2020-05-12 MED ORDER — LORAZEPAM 0.5 MG PO TABS
0.5000 mg | ORAL_TABLET | Freq: Every evening | ORAL | 0 refills | Status: DC | PRN
Start: 2020-05-12 — End: 2020-12-28

## 2020-05-12 MED ORDER — ACETAMINOPHEN 325 MG PO TABS: 650.0000 mg | ORAL_TABLET | Freq: Four times a day (QID) | ORAL | 1 refills | Status: DC | PRN

## 2020-05-12 MED ORDER — ADULT MULTIVITAMIN W/MINERALS CH: 1.0000 | ORAL_TABLET | Freq: Every day | ORAL | 2 refills | Status: DC

## 2020-05-12 MED ORDER — FOLIC ACID 1 MG PO TABS: 1.0000 mg | ORAL_TABLET | Freq: Every day | ORAL | 2 refills | Status: DC

## 2020-05-12 MED ORDER — SODIUM CHLORIDE 0.9 % IV SOLN: INTRAVENOUS | Status: DC

## 2020-05-12 MED ORDER — TRAZODONE HCL 100 MG PO TABS: 100.0000 mg | ORAL_TABLET | Freq: Every day | ORAL | 1 refills | Status: DC

## 2020-05-12 NOTE — Progress Notes (Signed)
Physical Therapy Treatment Patient Details Name: [name] MRN: [id number] DOB: 1961-10-05 Today's Date: 05/12/2020    History of Present Illness [name] is a 59 y.o. male with medical history significant of with past medical history significant for old ischemic CVA (with left hemiparesis), hypertension, major depression-anxiety, alcoholism, COPD and tobacco abuse; who was brought to the hospital secondary to increased confusion, combativeness and altered mental status. Daughter reported to EDP that he has been struggling with severe depression and has not been taking his psychiatry medications properly at home and has not been eating or drinking adequately. There has not been any concerns of fever, chills, nausea, vomiting, abdominal pain, dysuria, hematuria, melena, hematochezia, new focal deficits, headaches or any other complaints.    PT Comments    Patient demonstrates increased endurance/distance for ambulation pushing IV pole with RUE, occasional stumbling without loss of balance, limited secondary to fatigue and tolerated sitting up in chair after therapy - RN notified.  Patient will benefit from continued physical therapy in hospital and recommended venue below to increase strength, balance, endurance for safe ADLs and gait.    Follow Up Recommendations  SNF;Supervision for mobility/OOB;Supervision - Intermittent     Equipment Recommendations  None recommended by PT    Recommendations for Other Services       Precautions / Restrictions Precautions Precautions: Fall Restrictions Weight Bearing Restrictions: No    Mobility  Bed Mobility Overal bed mobility: Needs Assistance Bed Mobility: Supine to Sit     Supine to sit: Min assist     General bed mobility comments: has diffiuclty propping up on RUE, LUE non-functional  Transfers Overall transfer level: Needs assistance Equipment used: 1 person hand held assist;None Transfers: Sit to/from [employer] Sit to Stand: Min guard;Min assist Stand pivot transfers: Min guard;Min assist       General transfer comment: demonstrates increased BLE strength for sit to stands and transfers  Ambulation/Gait Ambulation/Gait assistance: Min assist;Min guard Gait Distance (Feet): 75 Feet Assistive device: IV Pole Gait Pattern/deviations: Decreased step length - right;Decreased step length - left;Decreased stance time - left;Decreased stride length Gait velocity: decreased   General Gait Details: unsteady labored cadence with occasional near loss of balance pushing IV pole, limited secondary to fatigue   Stairs             Wheelchair Mobility    Modified Rankin (Stroke Patients Only)       Balance Overall balance assessment: Needs assistance Sitting-balance support: Feet supported;No upper extremity supported Sitting balance-Leahy Scale: Fair Sitting balance - Comments: fair/good seated at EOB   Standing balance support: During functional activity;Single extremity supported Standing balance-Leahy Scale: Fair Standing balance comment: using IV pole                            Cognition Arousal/Alertness: Awake/alert Behavior During Therapy: WFL for tasks assessed/performed Overall Cognitive Status: Within Functional Limits for tasks assessed                                        Exercises General Exercises - Lower Extremity Long Arc Quad: Seated;AROM;Strengthening;Both;10 reps Hip Flexion/Marching: Seated;AROM;Strengthening;Both;10 reps Toe Raises: Seated;AROM;Strengthening;Both;10 reps Heel Raises: Seated;AROM;Strengthening;Both;10 reps    General Comments        Pertinent Vitals/Pain Pain Assessment: No/denies pain    Home Living  Prior Function            PT Goals (current goals can now be found in the care plan section) Acute Rehab PT Goals Patient Stated Goal: return home after rehab PT  Goal Formulation: With patient Time For Goal Achievement: 05/25/20 Potential to Achieve Goals: Good Progress towards PT goals: Progressing toward goals    Frequency    Min 3X/week      PT Plan Current plan remains appropriate    Co-evaluation              AM-PAC PT "6 Clicks" Mobility   Outcome Measure  Help needed turning from your back to your side while in a flat bed without using bedrails?: None Help needed moving from lying on your back to sitting on the side of a flat bed without using bedrails?: A Little Help needed moving to and from a bed to a chair (including a wheelchair)?: A Little Help needed standing up from a chair using your arms (e.g., wheelchair or bedside chair)?: A Little Help needed to walk in hospital room?: A Little Help needed climbing 3-5 steps with a railing? : A Lot 6 Click Score: 18    End of Session   Activity Tolerance: Patient tolerated treatment well;Patient limited by fatigue Patient left: in chair;with call bell/phone within reach Nurse Communication: Mobility status PT Visit Diagnosis: Unsteadiness on feet (R26.81);Other abnormalities of gait and mobility (R26.89);Muscle weakness (generalized) (M62.81)     Time: 6283-1517 PT Time Calculation (min) (ACUTE ONLY): 27 min  Charges:  $Gait Training: 8-22 mins $Therapeutic Exercise: 8-22 mins                     12:13 PM, 05/12/20 Lonell Grandchild, MPT Physical Therapist with [facility] [phone] office [phone] mobile phone

## 2020-05-12 NOTE — Progress Notes (Signed)
Discharge instructions provided to patient. Patient verbalized understanding of discharge instructions. Re-itirated instructions for patient to remain lying down for an hour since temporary HD catheter was just removed. Also called daughter, [name], to inform of patient being discharged and provided discharge instructions to her as well. Daughter also verbalized understanding of discharge instructions and had no further questions. Discharge paperwork and patient belongings sent with EMS. Attempting to call report to [facility].

## 2020-05-12 NOTE — Progress Notes (Signed)
Nephrology Progress Note:   Patient ID: [name], male   DOB: 03/23/1961, 59 y.o.   MRN: [id number]   Subjective:   Had 1.4 liters UOP over 7/15 charted.  Had one HD treatment on 7/13 with net positive 737 mL after the treatment.  He has been on NS at 75/hr - running on my exam.   Review of systems:    Denies shortness of breath or chest pain Denies n/v   O:BP (!) 93/58   Pulse 79   Temp 98.6 F (37 C) (Oral)   Resp 14   Ht 5\' 7"  (1.702 m)   Wt 76.7 kg   SpO2 93%   BMI 26.48 kg/m   Intake/Output Summary (Last 24 hours) at 05/12/2020 0927 Last data filed at 05/12/2020 0622 Gross per 24 hour  Intake 1604.68 ml  Output 1375 ml  Net 229.68 ml   Intake/Output: I/O last 3 completed shifts: In: 1604.7 [I.V.:1604.7] Out: 1925 [TDVVO:1607]  Intake/Output this shift:  No intake/output data recorded. Weight change:    Gen: adult male in bed in NAD   CVS: S1S2; no rub Resp: clear and unlabored room air Abd:  soft, NT/ND Ext: no edema no cyanosis or clubbing Neuro - conversant and oriented to person and year pysch - no current agitation or anxiety  Access - nontunneled RIJ in place   Recent Labs  Lab 05/09/20 1138 05/09/20 1427 05/09/20 1857 05/10/20 0447 05/11/20 0344 05/12/20 0451  NA 134* 135 135 139 139  139 138  K >7.5* 6.2* 3.7 4.8 4.5  4.5 4.2  CL 105 110 104 102 101  100 103  CO2 14* 14* 21* 26 29  29 27   GLUCOSE 122* 93 95 89 102*  103* 130*  BUN 124* 120* 58* 55* 44*  44* 29*  CREATININE 9.66* 8.77* 4.27* 3.91* 2.02*  2.06* 1.29*  ALBUMIN 3.7  --  2.7* 3.3* 3.1*  3.1* 2.8*  CALCIUM 9.6 8.4* 8.0* 8.7* 8.1*  8.0* 7.7*  PHOS  --   --  2.6 3.1 2.8 2.5  AST 14*  --  15  --  15  --   ALT 11  --  9  --  12  --    Liver Function Tests: Recent Labs  Lab 05/09/20 1138 05/09/20 1138 05/09/20 1857 05/09/20 1857 05/10/20 0447 05/11/20 0344 05/12/20 0451  AST 14*  --  15  --   --  15  --   ALT 11  --  9  --   --  12  --   ALKPHOS 101  --  73  --    --  83  --   BILITOT 0.5  --  0.9  --   --  0.5  --   PROT 7.2  --  5.2*  --   --  6.0*  --   ALBUMIN 3.7   < > 2.7*   < > 3.3* 3.1*  3.1* 2.8*   < > = values in this interval not displayed.   No results for input(s): LIPASE, AMYLASE in the last 168 hours. Recent Labs  Lab 05/09/20 1139  AMMONIA 9   CBC: Recent Labs  Lab 05/09/20 1138 05/09/20 1138 05/09/20 1857 05/09/20 1857 05/10/20 0447 05/11/20 0344 05/12/20 0451  WBC 13.3*   < > 7.2   < > 8.8 8.8 8.6  NEUTROABS 11.2*  --   --   --   --   --   --  HGB 12.4*   < > 9.4*   < > 12.0* 11.9* 11.0*  HCT 37.9*   < > 28.6*   < > 34.9* 35.8* 33.2*  MCV 101.3*  --  99.7  --  98.3 101.1* 101.5*  PLT 281   < > 211   < > 251 238 231   < > = values in this interval not displayed.   Cardiac Enzymes: No results for input(s): CKTOTAL, CKMB, CKMBINDEX, TROPONINI in the last 168 hours. CBG: Recent Labs  Lab 05/09/20 1123  GLUCAP 234*    Iron Studies: No results for input(s): IRON, TIBC, TRANSFERRIN, FERRITIN in the last 72 hours. Studies/Results: No results found. Marland Kitchen aspirin EC  81 mg Oral Daily  . atorvastatin  40 mg Oral q1800  . Chlorhexidine Gluconate Cloth  6 each Topical Q0600  . folic acid  1 mg Oral Daily  . LORazepam  0-4 mg Intravenous Q12H  . multivitamin with minerals  1 tablet Oral Daily  . nicotine  21 mg Transdermal Daily  . thiamine  100 mg Oral Daily   Or  . thiamine  100 mg Intravenous Daily    BMET    Component Value Date/Time   NA 138 05/12/2020 0451   NA 134 (A) 01/29/2017 0000   K 4.2 05/12/2020 0451   CL 103 05/12/2020 0451   CO2 27 05/12/2020 0451   GLUCOSE 130 (H) 05/12/2020 0451   BUN 29 (H) 05/12/2020 0451   BUN 28 (A) 01/29/2017 0000   CREATININE 1.29 (H) 05/12/2020 0451   CREATININE 1.01 04/03/2018 1528   CALCIUM 7.7 (L) 05/12/2020 0451   GFRNONAA 59 (L) 05/12/2020 0451   GFRAA >60 05/12/2020 0451   CBC    Component Value Date/Time   WBC 8.6 05/12/2020 0451   RBC 3.27 (L)  05/12/2020 0451   HGB 11.0 (L) 05/12/2020 0451   HCT 33.2 (L) 05/12/2020 0451   PLT 231 05/12/2020 0451   MCV 101.5 (H) 05/12/2020 0451   MCH 33.6 05/12/2020 0451   MCHC 33.1 05/12/2020 0451   RDW 12.5 05/12/2020 0451   LYMPHSABS 1.1 05/09/2020 1138   MONOABS 0.9 05/09/2020 1138   EOSABS 0.1 05/09/2020 1138   BASOSABS 0.0 05/09/2020 1138     Assessment/Plan: 1.  AKI- possible ischemic ATN in setting of hypotension with concomitant ACE-inhibitor as well as NSAIDs and diuretics.  Possible ingestion, although he denies it at this time. Unfortunately we do not have any labs since March 2021.  The only new medication started was chlorthalidone on 04/21/20 but no labs were drawn due to phlebotomist was not able to get any blood.  He has received multiple liters of NS in the ED and is nonoliguric.  Did have foley as well.  S/p emergent dialysis in the ED 05/09/20 due to hyperkalemia.  HD catheter placed by Dr. Elsworth Soho in the ED 05/09/20.  Renal US was unremarkable.  UA no RBC and neg protein.  tox screen including ethylene glycol and methanol negative UDS neg  1. Continue NS at 75 /hr x 24 hours then reassess per primary  2. Discontinue foley and monitor for retention - order placed  3. Discontinue nontunneled HD catheter - order placed  2. Hyperkalemia- resolved with HD and temporizing meds 3. Metabolic acidosis- resolved s/p bicarb and HD.  Lactate normal at 1.1.   4. Hypotension/sepsis/hypothermia- unclear etiology.  Cultures drawn and blood culture pending - NGTD.  Urine cx negative.  s/p Abx per primary.  Appreciate  Dr. Bari Mantis assistance. 5. Acute metabolic encephlopathy- unclear etiology.  Workup per primary team.  CT scan negative, NH3 WNL.   Nephrology will sign off.  Please do not hesitate to contact us with any questions.  Would not resume chlorthalidone, lisinopril.  Does not appear to need potassium at this time.  Would need BMP at one -week follow-up appt with PCP.  There is not a need for  nephrology follow-up at this time.  Claudia Desanctis, MD 05/12/2020 9:43 AM

## 2020-05-12 NOTE — Discharge Instructions (Signed)
1)Avoid ibuprofen/Advil/Aleve/Motrin/Goody Powders/Naproxen/BC powders/Meloxicam/Diclofenac/Indomethacin and other Nonsteroidal anti-inflammatory medications as these will make you more likely to bleed and can cause stomach ulcers, can also cause Kidney problems.   2)Repeat CBC and Repeat BMP on Monday 05/15/20---

## 2020-05-12 NOTE — Discharge Summary (Addendum)
HOYLE BARKDULL, is a 59 y.o. male  DOB 15-Dec-1960  MRN [id number].  Admission date:  05/09/2020  Admitting Physician  Barton Dubois, MD  Discharge Date:  05/12/2020   Primary MD  Tammi Sou, MD  Recommendations for primary care physician for things to follow:  1)Avoid ibuprofen/Advil/Aleve/Motrin/Goody Powders/Naproxen/BC powders/Meloxicam/Diclofenac/Indomethacin and other Nonsteroidal anti-inflammatory medications as these will make you more likely to bleed and can cause stomach ulcers, can also cause Kidney problems.   2)Repeat CBC and Repeat BMP on Monday 05/15/20---  Admission Diagnosis  ARF (acute renal failure) ([place]) [N17.9] Encounter for dialysis and dialysis catheter care [facility]) [Z99.2]   Discharge Diagnosis  ARF (acute renal failure) ([place]) [N17.9] Encounter for dialysis and dialysis catheter care [facility]) [Z99.2]    Principal Problem:   ARF (acute renal failure) ([place]) Active Problems:   B12 deficiency   Tobacco abuse   COPD (chronic obstructive pulmonary disease) ([place])   History of ischemic stroke   Essential hypertension   Acute metabolic encephalopathy   Alcohol abuse      Past Medical History:  Diagnosis Date  . Alcoholism ([place])    Continues to abuse ETOH as of 01/2017; pt has poor insight into his problem.  Pt states that as of 04/2017 he has been 3 monthds sober.  . Anxiety and depression   . Arthritis    "hands, legs" (12/23/2017)  . Chronic joint pain   . COPD (chronic obstructive pulmonary disease) ([place])   . Depression   . Frequent headaches    "depends on BP" (12/23/2017)  . History of kidney stones   . Hypertension   . Insomnia 08/26/2017   08/23/17 ED visit for adverse rxn to taking ambien, PLUS + marijuana on UDS her and in ED---NO FURTHER AMBIEN OR ANY OTHER CONTROLLED SUBSTANCES WILL BE PRESCRIBED BY ME--PM  . Pneumothorax, right   . Polysubstance abuse ([place])   . Poor  historian    UNRELIABLE HISTORIAN  . 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  Baclofen oral no help.  Botox inj started by Dr. Posey Pronto 07/2018--no help x 1.  Inj #2 done 10/2018.  Marland Kitchen Stroke ([place]) 2012   LUE residual deficit: flexion contracture and weakness of L hand.  Old bilateral cerebral infarcts involving the right frontal and parietal lobes as well as the left occipital lobe.  Baclofen and botox via neuro for L arm contracture.  . Stroke [facility])    "3 mini strokes since 2012" (12/23/2017)  . Subdural hematoma ([place]) 01/29/2017   Subacute right-sided frontal subdural hematoma: found on CT in [place] ED, transferred to [facility] where this was non-operatively managed.  Pt admitted to the MDs at [facility] that he still drinks lots of whisky daily and they surmised that his frequent falls of late are due to this and frequent use of OTC sleep aids.  Near complete resolution on CT 03/27/18.  . Venous insufficiency of both lower extremities  Past Surgical History:  Procedure Laterality Date  . CHEST TUBE INSERTION    . LUNG SURGERY    . SHOULDER OPEN ROTATOR CUFF REPAIR Bilateral    Multiple rotator cuff surgeries on both sides.  . TONSILLECTOMY    . TRANSTHORACIC ECHOCARDIOGRAM  09/24/2019   EF 55-60%, grd I DD.  Marland Kitchen TYMPANOPLASTY Right   . US CAROTIDS  09/24/2019   Minimal atherosclerotic plaque bilat; no flow obstruction  . VIDEO ASSISTED THORACOSCOPY (VATS) W/TALC PLEUADESIS  2010 X2     HPI  from the history and physical done on the day of admission:   Chief Complaint: AMS, general malaise.   HPI: TORRELL KRUTZ is a 59 y.o. male with medical history significant of with past medical history significant for old ischemic CVA (with left hemiparesis), hypertension, major depression-anxiety, alcoholism, COPD and tobacco abuse; who was brought to the hospital secondary to increased confusion, combativeness and altered mental status. Daughter reported  to EDP that he has been struggling with severe depression and has not been taking his psychiatry medications properly at home and has not been eating or drinking adequately. There has not been any concerns of fever, chills, nausea, vomiting, abdominal pain, dysuria, hematuria, melena, hematochezia, new focal deficits, headaches or any other complaints.  Patient was last seen normal on 05/08/2020 around 9:30 pm; patient and family member denies any recent alcohol or other recreational substance use. Despite ongoing depression there has not been concerned for suicidal ideation, hallucinations or any attempt to harm himself.  ED Course: Patient was found with severe hyperkalemia, acute renal failure with a creatinine up to 9.66, severe uremia and metabolic acidosis; chest x-ray without acute infiltrates, urinalysis no suggesting infection in his urine. IV fluids were given, insulin/D50, IV bicarbonate and calcium provided for hyperkalemia; EKG no demonstrating acute electrical abnormalities. Renal service was consulted along with PCCM.    Of note, on admission patient vital signs demonstrated to be hypotensive, hypoxic at 87% on room air, alcohol level less than 10 and mildly elevation in leukocytes.  Review of Systems: Unable to be further review as patient mentation was in pain; so info as per HPI otherwise all other systems reviewed and are negative.     Hospital Course:   Brief Summary:- 58 year old with past medical history multiple prior CVA with residual left hemiparesis, HTN, depressive disorder with anxiety, COPD and tobacco abuse admitted on 05/09/2020 with AKI, hyperkalemia and hypotension with metabolic acidosis required emergent hemodialysis on 05/09/2020  A/p 1)AKI----acute kidney injury --worsening renal function was due to presumed ATN in the setting of hypotension compounded by concomitant ACE inhibitor as well as NSAID and diuretic use   --creatinine on admission= 9.66 , baseline  creatinine = reportedly WNL previously   , creatinine is now=1.29 ,  renally adjust medications, avoid nephrotoxic agents / dehydration  / hypotension -Status post emergency hemodialysis on 05/09/2020 -Renal ultrasound without obstructive uropathy -UDS negative -Metabolic acidosis-resolved -Continue to avoid NSAIDs and other nephrotoxic agents -Continue to hold prior to admission lisinopril  2)Hyperkalemia--secondary to #1 above, potassium is  down to 4.2 from 7.5 after emergent hemodialysis on 05/09/2020 -Lisinopril remains on hold  3)HTN--hypotension  resolved,  -BP stable, continue to hold chlorthalidone, felodipine and lisinopril  -A.m. cortisol level 7.6 -Consider ACTH stimulation test if hypotension persist  4)Acute metabolic encephalopathy--- multifactorial but mostly due to #1 above -CT head w/o acute findings -UDS negative Resolved with resolution of AKI  5)COPD/Tobacco Abuse--- continue bronchodilators, nicotine patch as ordered -Hypoxia  and no oxygen requirement at this time  6)H/o Prior CVA--residual left hemiparesis, continue aspirin and atorvastatin  7) B12 deficiency--- give IM B12 injection, continue monthly B12 shots, next dose around June 11, 2020  Disposition:-SNF rehab  Code Status : full code  Family Communication:   -- Discussed with  patient's daughter [name]  Consults  : Nephrology and PCCM   Discharge Condition: Stable  Follow UP   Contact information for after-discharge care    [facility] .   Service: Skilled Nursing Contact information: [place] [phone]                Diet and Activity recommendation:  As advised  Discharge Instructions    Discharge Instructions    Diet - low sodium heart healthy   Complete by: As directed    Discharge instructions   Complete by: As directed    1)Avoid ibuprofen/Advil/Aleve/Motrin/Goody  Powders/Naproxen/BC powders/Meloxicam/Diclofenac/Indomethacin and other Nonsteroidal anti-inflammatory medications as these will make you more likely to bleed and can cause stomach ulcers, can also cause Kidney problems.   2)Repeat CBC and Repeat BMP on Monday 05/15/20---   Increase activity slowly   Complete by: As directed         Discharge Medications     Allergies as of 05/12/2020      Reactions   Ambien [zolpidem] Other (See Comments)   Pt took too much, acted out a bad dream/was throwing things, etc--ED visit (07/2017)      Medication List    STOP taking these medications   chlorthalidone 25 MG tablet Commonly known as: HYGROTON   felodipine 10 MG 24 hr tablet Commonly known as: PLENDIL   lisinopril 40 MG tablet Commonly known as: ZESTRIL   potassium chloride SA 20 MEQ tablet Commonly known as: KLOR-CON     TAKE these medications   acetaminophen 325 MG tablet Commonly known as: TYLENOL Take 2 tablets (650 mg total) by mouth every 6 (six) hours as needed for headache.   aspirin 81 MG EC tablet Take 1 tablet (81 mg total) by mouth daily with breakfast. What changed: when to take this   atorvastatin 40 MG tablet Commonly known as: LIPITOR Take 1 tablet (40 mg total) by mouth daily at 6 PM.   cyanocobalamin 1000 MCG/ML injection Commonly known as: (VITAMIN B-12) Inject 1 mL (1,000 mcg total) into the muscle every 30 (thirty) days. For vitamin B71 deficiency   folic acid 1 MG tablet Commonly known as: FOLVITE Take 1 tablet (1 mg total) by mouth daily. Start taking on: May 13, 2020   LORazepam 0.5 MG tablet Commonly known as: Ativan Take 1 tablet (0.5 mg total) by mouth at bedtime as needed for anxiety.   mirtazapine 15 MG tablet Commonly known as: Remeron 1 tab po qhs x 15d, then increase to 2 tabs po qhs   multivitamin with minerals Tabs tablet Take 1 tablet by mouth daily. Start taking on: May 13, 2020   nicotine 21 mg/24hr patch Commonly known  as: NICODERM CQ - dosed in mg/24 hours Place 1 patch (21 mg total) onto the skin daily. Start taking on: May 13, 2020 What changed:   how much to take  how to take this  when to take this  additional instructions   ondansetron 4 MG tablet Commonly known as: ZOFRAN Take 1 tablet (4 mg total) by mouth every 6 (six) hours as needed for nausea.  QUEtiapine 200 MG tablet Commonly known as: SEROQUEL 1 tab po qhs What changed: medication strength   thiamine 100 MG tablet Commonly known as: Vitamin B-1 Take 1 tablet (100 mg total) by mouth daily.   traZODone 100 MG tablet Commonly known as: DESYREL Take 1 tablet (100 mg total) by mouth at bedtime. What changed: how much to take       Major procedures and Radiology Reports - PLEASE review detailed and final reports for all details, in brief -    CT HEAD WO CONTRAST  Result Date: 05/09/2020 CLINICAL DATA:  59 year old male with neurologic deficit. EXAM: CT HEAD WITHOUT CONTRAST TECHNIQUE: Contiguous axial images were obtained from the base of the skull through the vertex without intravenous contrast. COMPARISON:  Head CT dated 09/23/2019. FINDINGS: Evaluation of this exam is limited due to motion artifact. Brain: Large right MCA territory old infarct and encephalomalacia as well as additional areas of old infarct involving the occipital lobes. There is mild age-related atrophy and chronic microvascular ischemic changes. There is no acute intracranial hemorrhage. No mass effect or midline shift. No extra-axial fluid collection. Vascular: No hyperdense vessel or unexpected calcification. Skull: Normal. Negative for fracture or focal lesion. Sinuses/Orbits: No acute finding. Other: None IMPRESSION: 1. No acute intracranial hemorrhage. 2. Age-related atrophy and chronic microvascular ischemic changes. Multiple old infarcts. Electronically Signed   By: Anner Crete M.D.   On: 05/09/2020 21:50   US RENAL  Result Date:  05/09/2020 CLINICAL DATA:  Acute kidney injury. EXAM: RENAL / URINARY TRACT ULTRASOUND COMPLETE COMPARISON:  Abdominal ultrasound dated November 03, 2010. FINDINGS: Right Kidney: Renal measurements: 11.6 x 5.0 x 5.6 cm = volume: 172 mL . Echogenicity within normal limits. No mass or hydronephrosis visualized. Left Kidney: Renal measurements: 12.1 x 6.3 x 6.4 cm = volume: 256 mL. Echogenicity within normal limits. No mass or hydronephrosis visualized. Bladder: Decompressed by Foley catheter. Other: None. IMPRESSION: 1. Normal renal ultrasound. Electronically Signed   By: Titus Dubin M.D.   On: 05/09/2020 16:10   DG Chest Port 1 View  Result Date: 05/09/2020 CLINICAL DATA:  Confusion, weakness EXAM: PORTABLE CHEST 1 VIEW COMPARISON:  09/23/2019 FINDINGS: The heart size and mediastinal contours are within normal limits. No focal airspace consolidation, pleural effusion, or pneumothorax. The visualized skeletal structures are unremarkable. IMPRESSION: No active disease. Electronically Signed   By: Davina Poke D.O.   On: 05/09/2020 11:44   DG Chest Port 1V same Day  Result Date: 05/09/2020 CLINICAL DATA:  Dialysis catheter placement. EXAM: PORTABLE CHEST 1 VIEW COMPARISON:  05/09/2020 at 11:32 a.m. FINDINGS: A right jugular catheter has been placed and terminates over the mid SVC. The cardiomediastinal silhouette is unchanged with normal heart size. No confluent airspace opacity, edema, pleural effusion, pneumothorax is identified. Postsurgical changes are noted in the right lung apex. IMPRESSION: Right jugular catheter placement as above. Electronically Signed   By: Logan Bores M.D.   On: 05/09/2020 16:41    Micro Results   Recent Results (from the past 240 hour(s))  Urine culture     Status: None   Collection Time: 05/09/20 11:34 AM   Specimen: In/Out Cath Urine  Result Value Ref Range Status   Specimen Description   Final    IN/OUT CATH URINE Performed at [facility], [address]  [place], [place] [address]    Special Requests   Final    NONE Performed at [facility], [address], [place], [place] [address]  Culture   Final    NO GROWTH Performed at [facility], [place] [address] [place], [place] [address]    Report Status 05/10/2020 FINAL  Final  Blood Culture (routine x 2)     Status: None (Preliminary result)   Collection Time: 05/09/20 11:40 AM   Specimen: BLOOD RIGHT FOREARM  Result Value Ref Range Status   Specimen Description BLOOD RIGHT FOREARM DRAWN BY RN TBV  Final   Special Requests   Final    BOTTLES DRAWN AEROBIC AND ANAEROBIC Blood Culture adequate volume   Culture   Final    NO GROWTH 3 DAYS Performed at [facility], [address], [place], [place] [address]    Report Status PENDING  Incomplete  Blood Culture (routine x 2)     Status: None (Preliminary result)   Collection Time: 05/09/20 11:45 AM   Specimen: BLOOD  Result Value Ref Range Status   Specimen Description BLOOD RIGHT ANTECUBITAL  Final   Special Requests   Final    BOTTLES DRAWN AEROBIC AND ANAEROBIC Blood Culture adequate volume   Culture   Final    NO GROWTH 3 DAYS Performed at [facility], [address] [place], [place] [address]    Report Status PENDING  Incomplete  SARS Coronavirus 2 by RT PCR (hospital order, performed in [place] hospital lab) Nasopharyngeal Nasopharyngeal Swab     Status: None   Collection Time: 05/09/20  3:25 PM   Specimen: Nasopharyngeal Swab  Result Value Ref Range Status   SARS Coronavirus 2 NEGATIVE NEGATIVE Final    Comment: (NOTE) SARS-CoV-2 target nucleic acids are NOT DETECTED.  The SARS-CoV-2 RNA is generally detectable in upper and lower respiratory specimens during the acute phase of infection. The lowest concentration of SARS-CoV-2 viral copies this assay can detect is 250 copies / mL. A negative result does not preclude SARS-CoV-2 infection and should not be used as the sole basis for treatment or other patient  management decisions.  A negative result may occur with improper specimen collection / handling, submission of specimen other than nasopharyngeal swab, presence of viral mutation(s) within the areas targeted by this assay, and inadequate number of viral copies (<250 copies / mL). A negative result must be combined with clinical observations, patient history, and epidemiological information.  Fact Sheet for Patients:   StrictlyIdeas.no  Fact Sheet for Healthcare Providers: BankingDealers.co.za  This test is not yet approved or  cleared by the [place] FDA and has been authorized for detection and/or diagnosis of SARS-CoV-2 by FDA under an Emergency Use Authorization (EUA).  This EUA will remain in effect (meaning this test can be used) for the duration of the COVID-19 declaration under Section 564(b)(1) of the Act, 21 U.S.C. section 360bbb-3(b)(1), unless the authorization is terminated or revoked sooner.  Performed at [facility], [address] [place], [place] [address]   MRSA PCR Screening     Status: None   Collection Time: 05/09/20  5:04 PM   Specimen: Nasal Mucosa; Nasopharyngeal  Result Value Ref Range Status   MRSA by PCR NEGATIVE NEGATIVE Final    Comment:        The GeneXpert MRSA Assay (FDA approved for NASAL specimens only), is one component of a comprehensive MRSA colonization surveillance program. It is not intended to diagnose MRSA infection nor to guide or monitor treatment for MRSA infections. Performed at [facility], [address] [place], [place] [address]    Today   Subjective  [name] today has no new concerns No fever  Or chills   No Nausea, Vomiting or Diarrhea -Voided well after Foley removal     Patient has been seen and examined prior to discharge   Objective   Blood pressure 127/87, pulse 81, temperature 98.3 F (36.8 C), temperature source Oral, resp. rate 19, height 5\' 7"   (1.702 m), weight 76.7 kg, SpO2 95 %.   Intake/Output Summary (Last 24 hours) at 05/12/2020 1521 Last data filed at 05/12/2020 1500 Gross per 24 hour  Intake 0 ml  Output 2225 ml  Net -2225 ml    Exam Gen:- Awake Alert, no acute distress  HEENT:- [place].AT, No sclera icterus Neck-Supple Neck,No JVD,.  Lungs-  CTAB , fair, symmetrical air movement bilaterally  CV- S1, S2 normal, regular Abd-  +ve B.Sounds, Abd Soft, No tenderness,    Extremity/Skin:- No  edema,   good pulses Psych-affect is appropriate, oriented x3 Neuro-old, residual left-sided hemiparesis from previous stroke, no new focal deficits, no tremors  GU-Foley removed   Data Review   CBC w Diff:  Lab Results  Component Value Date   WBC 8.6 05/12/2020   HGB 11.0 (L) 05/12/2020   HCT 33.2 (L) 05/12/2020   PLT 231 05/12/2020   LYMPHOPCT 8 05/09/2020   MONOPCT 7 05/09/2020   EOSPCT 1 05/09/2020   BASOPCT 0 05/09/2020    CMP:  Lab Results  Component Value Date   NA 138 05/12/2020   NA 134 (A) 01/29/2017   K 4.2 05/12/2020   CL 103 05/12/2020   CO2 27 05/12/2020   BUN 29 (H) 05/12/2020   BUN 28 (A) 01/29/2017   CREATININE 1.29 (H) 05/12/2020   CREATININE 1.01 04/03/2018   PROT 6.0 (L) 05/11/2020   ALBUMIN 2.8 (L) 05/12/2020   BILITOT 0.5 05/11/2020   ALKPHOS 83 05/11/2020   AST 15 05/11/2020   ALT 12 05/11/2020  .   Total Discharge time is about 33 minutes  Roxan Hockey M.D on 05/12/2020 at 3:21 PM  Go to [email or web address] -  for contact info  [facility] - Office  [phone]

## 2020-05-12 NOTE — Progress Notes (Signed)
Report given to Claudie Fisherman at [facility].

## 2020-05-12 NOTE — Progress Notes (Signed)
Temporary HD catheter removed from right IJ per care order by Nephrology. Sterile petroleum dressing and sterile gauze applied to sight. Direct, firm pressure held to site for 25 minutes. Patient advised to remain lying for at least and hour and to leave pressure dressing in place for 24 hours. Patient verbalized understanding.

## 2020-05-12 NOTE — TOC Transition Note (Signed)
Transition of Care [facility]) - CM/SW Discharge Note   Patient Details  Name: [name] MRN: [id number] Date of Birth: [birth year]  [facility]) CM/SW Contact:  Natasha Bence, LCSW Phone Number: 05/12/2020, 3:09 PM   Clinical Narrative:    CSW confirmed placement with [facility]) and received Navi authorization. Family agreeable to [facility] rehab and expressed they will work to transfer patient to a [place] facility once physically able. CSW set up transport. Nurse to call report.      Barriers to Discharge: Continued Medical Work up   Patient Goals and CMS Choice Patient states their goals for this hospitalization and ongoing recovery are:: family wants rehab and home   Choice offered to / list presented to : Adult Children  Discharge Placement                       Discharge Plan and Services                                     Social Determinants of Health (SDOH) Interventions     Readmission Risk Interventions No flowsheet data found.

## 2020-05-14 LAB — CULTURE, BLOOD (ROUTINE X 2)
Culture: NO GROWTH
Culture: NO GROWTH
Special Requests: ADEQUATE
Special Requests: ADEQUATE

## 2020-05-15 DIAGNOSIS — G9341 Metabolic encephalopathy: Secondary | ICD-10-CM | POA: Diagnosis not present

## 2020-05-15 DIAGNOSIS — I639 Cerebral infarction, unspecified: Secondary | ICD-10-CM | POA: Diagnosis not present

## 2020-05-15 DIAGNOSIS — N179 Acute kidney failure, unspecified: Secondary | ICD-10-CM | POA: Diagnosis not present

## 2020-05-16 ENCOUNTER — Other Ambulatory Visit: Payer: Self-pay | Admitting: Family Medicine

## 2020-05-20 ENCOUNTER — Other Ambulatory Visit: Payer: Self-pay | Admitting: Family Medicine

## 2020-05-22 ENCOUNTER — Telehealth: Payer: Self-pay

## 2020-05-22 NOTE — Telephone Encounter (Signed)
Patient left VM asking for call back with possible recommendations. He is currently in [place] but was admitted to hospital within the last 2 weeks for kidney failure.

## 2020-05-23 NOTE — Telephone Encounter (Signed)
Patient returned call and left VM this morning to receive call back. Tried reaching out to patient but still no answer so message left to return call.

## 2020-05-24 ENCOUNTER — Encounter: Payer: Self-pay | Admitting: Family Medicine

## 2020-05-24 NOTE — Telephone Encounter (Signed)
Tried calling patient, left message to return call.  

## 2020-05-24 NOTE — Telephone Encounter (Signed)
Pt went to Nursing home after hospital visit and left against AMA. He left to go to [place]. Pt wanted to let Dr Anitra Lauth know that he stopped all BP medications and that BP has been WNL.  He stopped taking dialysis. He was told to F/U with Dr Anitra Lauth or that he needed to find a PCP there if he was staying there. PT was advised that when someone is in kidney failure they need dialysis to keep them alive and he needed to review and make decisions with his PCP. He verbalized understanding

## 2020-05-24 NOTE — Telephone Encounter (Signed)
Noted  

## 2020-05-25 NOTE — Telephone Encounter (Signed)
LM for pt to returncall

## 2020-05-25 NOTE — Telephone Encounter (Signed)
Patient called back with his brother on the phone inquiring about lab orders. He was notified patient was advised to make f/u appt with PCP to discuss this further. He thought this could just be done but was told PCP out of the office today and would be best to make an appt. We cannot just put orders in and [place]. I did not guarantee anything but advised I would let PCP know.

## 2020-05-25 NOTE — Telephone Encounter (Signed)
Agree: I cannot do any orders for pt until I see him for f/u. I cannot do ANY orders for patient at all if he is out of state Signed:  Crissie Sickles, MD           05/25/2020

## 2020-05-26 ENCOUNTER — Other Ambulatory Visit: Payer: Self-pay | Admitting: Family Medicine

## 2020-05-26 DIAGNOSIS — M549 Dorsalgia, unspecified: Secondary | ICD-10-CM | POA: Diagnosis not present

## 2020-05-26 DIAGNOSIS — R944 Abnormal results of kidney function studies: Secondary | ICD-10-CM | POA: Diagnosis not present

## 2020-05-29 NOTE — Telephone Encounter (Signed)
LM for pt to returncall

## 2020-06-01 NOTE — Telephone Encounter (Signed)
Sent as FYI  Patient was contacted and bloodwork has been done in [place] last Friday, all labs were normal.

## 2020-06-01 NOTE — Telephone Encounter (Signed)
Ok great.

## 2020-06-01 NOTE — Telephone Encounter (Signed)
FYI

## 2020-07-20 ENCOUNTER — Other Ambulatory Visit: Payer: Self-pay | Admitting: Family Medicine

## 2020-10-11 ENCOUNTER — Other Ambulatory Visit: Payer: Self-pay | Admitting: Family Medicine

## 2020-10-16 ENCOUNTER — Other Ambulatory Visit: Payer: Self-pay | Admitting: Family Medicine

## 2020-11-22 ENCOUNTER — Other Ambulatory Visit: Payer: Self-pay | Admitting: Family Medicine

## 2020-11-24 ENCOUNTER — Telehealth: Payer: Self-pay | Admitting: Family Medicine

## 2020-11-24 ENCOUNTER — Telehealth: Payer: Self-pay

## 2020-11-24 NOTE — Telephone Encounter (Signed)
Patient requesting refill of Lisinopril, insists it has been prescribed by Dr. Anitra Lauth for years. [facility] uses [facility] [place], ph [phone].

## 2020-11-24 NOTE — Telephone Encounter (Signed)
[name], Robert Partridge (9:38 AM)     Patient requesting refill of Lisinopril, insists it has been prescribed by Dr. Anitra Lauth for years. [facility] [place], ph [phone].

## 2020-11-24 NOTE — Telephone Encounter (Signed)
Pt was last seen in our office 04/21/20 and phone note on 7/26 states he was in [place]. Needs clarify with patient if he is back in [place]. He is due for another appointment. Advised to f/u 4 weeks from 04/21/20 appointment for blood pressure.  [name] - Client Nonclinical Telephone Record  [facility] Client [name] - Client Client Site [place] - Day Physician Crissie Sickles - MD Contact Type Call Who Is Calling Patient / Member / Family / Caregiver Caller Name [name] Phone Number [phone] Patient Name [name] Patient DOB Mar 24, 1961 Call Type Message Only Information Provided Reason for Call Request for General Office Information Initial Comment Caller states he needs his blood pressure medication refilled by his doctor. He is just about out. Additional Comment [name] says he needs his BP meds refilled, he is just about out and without he may have a stroke. --message was taken, office hours provided, triage declined.-- Disp. Time Disposition Final User 11/23/2020 5:04:51 PM General Information Provided Yes Oletta Lamas, Tamika Call Closed By: Noni Saupe Transaction Date/Time: 11/23/2020 5:01:05 PM (ET)

## 2020-11-24 NOTE — Telephone Encounter (Signed)
Please see other encounter.

## 2020-11-24 NOTE — Telephone Encounter (Signed)
Pt is requesting refill for lisinopril until appt on 2/11.  Please advise, thanks.

## 2020-11-24 NOTE — Telephone Encounter (Signed)
Sched appointment for pt to f/u. Pt asking for grace period until appointment please advise if ok

## 2020-11-24 NOTE — Telephone Encounter (Signed)
A user error has taken place: encounter opened in error, closed for administrative reasons.

## 2020-11-24 NOTE — Telephone Encounter (Signed)
I have not seen him since June 2021. In July 2021 he was admitted for acute renal failure and high potassium and at discharge from the hospital it was recommended he stay off of lisinopril. Then [place] went out of [place] for a while (?[place]? Not sure). I have NO INFORMATION that would make me feel comfortable putting him back on lisinopril!  I need to know all the meds he is currently taking. -thx

## 2020-11-26 NOTE — Telephone Encounter (Signed)
LVM for pt to CB regarding rx request.   Note: Pt will need to keep upcoming appt

## 2020-11-27 NOTE — Telephone Encounter (Signed)
LVM for pt to CB regarding refill request.    Note: Pt will need to keep upcoming appt

## 2020-11-27 NOTE — Telephone Encounter (Signed)
Patient returned call and advised of PCP note. Kindly asked for all current medications to further assist in decision making for PCP. Declined to relay this information. He stated "what am I supposed to do when I have a stroke?". Advised we recommended he follow up back in July or with [facility]. No refill for Lisinopril would be given at this time but would highly recommend to keep his upcoming appt on 2/11

## 2020-12-05 ENCOUNTER — Other Ambulatory Visit: Payer: Self-pay

## 2020-12-08 ENCOUNTER — Ambulatory Visit: Payer: Medicare Other | Admitting: Family Medicine

## 2020-12-08 ENCOUNTER — Encounter: Payer: Self-pay | Admitting: Family Medicine

## 2020-12-08 DIAGNOSIS — Z0289 Encounter for other administrative examinations: Secondary | ICD-10-CM

## 2020-12-08 NOTE — Progress Notes (Deleted)
OFFICE VISIT  12/08/2020  CC: No chief complaint on file.   HPI:    Patient is a 60 y.o. Caucasian male who presents for f/u HTN and anx/dep. I last saw him 04/21/20. A/P as of that visit: "1) HTN: good control on current regimen. Checking BMET today since started chlorthalidone recently.  2) MDD/Anx:  Instructions: Stop your vilazodone. Start taking mirtazapine every night as instructed on the pill bottle. Research [facility] and [facility] psychiatric offices and let me know if you would like a referral to one of these places."  INTERIM HX: Shortly after I last saw him he was admitted with severe ARF.  He had acute metabolic encephalopathy from this.  He had apparently not been taking any meds or eating/drinking well due to depression. I reviewed the hospital records entirely today.  He got emergency HD, renal function recovered well and his sCr on day of d/c was 1.29. Went home on no bp meds b/c bp still borderline low.  Lost to f/u after d/c to SNF (apparently he left there AMA)->pt went to [place] for a while.  Of note, vit B12 low and mild macrocytic anemia in hosp as well.  ***     Past Medical History:  Diagnosis Date  . Alcoholism ([place])    Continues to abuse ETOH as of 01/2017; pt has poor insight into his problem.  Pt states that as of 04/2017 he has been 3 monthds sober.  . Anxiety and depression   . Arthritis    "hands, legs" (12/23/2017)  . Chronic joint pain   . COPD (chronic obstructive pulmonary disease) ([place])   . Depression   . Encounter for hemodialysis for acute renal failure ([place]) 04/2020   Poor PO intake/dehydration, with concurrent use of ACE-I, hctz, and NSAIDs.  . Frequent headaches    "depends on BP" (12/23/2017)  . History of kidney stones   . Hypertension   . Insomnia 08/26/2017   08/23/17 ED visit for adverse rxn to taking ambien, PLUS + marijuana on UDS her and in ED---NO FURTHER AMBIEN OR ANY OTHER CONTROLLED SUBSTANCES WILL BE PRESCRIBED  BY ME--PM  . Pneumothorax, right   . Polysubstance abuse ([place])   . Poor historian    UNRELIABLE HISTORIAN  . 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  Baclofen oral no help.  Botox inj started by Dr. Posey Pronto 07/2018--no help x 1.  Inj #2 done 10/2018.  Marland Kitchen Stroke ([place]) 2012   LUE residual deficit: flexion contracture and weakness of L hand.  Old bilateral cerebral infarcts involving the right frontal and parietal lobes as well as the left occipital lobe.  Baclofen and botox via neuro for L arm contracture.  . Stroke [facility])    "3 mini strokes since 2012" (12/23/2017)  . Subdural hematoma ([place]) 01/29/2017   Subacute right-sided frontal subdural hematoma: found on CT in [facility] ED, transferred to [facility] where this was non-operatively managed.  Pt admitted to the MDs at [facility] that he still drinks lots of whisky daily and they surmised that his frequent falls of late are due to this and frequent use of OTC sleep aids.  Near complete resolution on CT 03/27/18.  . Venous insufficiency of both lower extremities     Past Surgical History:  Procedure Laterality Date  . CHEST TUBE INSERTION    . LUNG SURGERY    . SHOULDER OPEN ROTATOR CUFF REPAIR Bilateral  Multiple rotator cuff surgeries on both sides.  . TONSILLECTOMY    . TRANSTHORACIC ECHOCARDIOGRAM  09/24/2019   EF 55-60%, grd I DD.  Marland Kitchen TYMPANOPLASTY Right   . US CAROTIDS  09/24/2019   Minimal atherosclerotic plaque bilat; no flow obstruction  . VIDEO ASSISTED THORACOSCOPY (VATS) W/TALC PLEUADESIS  2010 X2    Outpatient Medications Prior to Visit  Medication Sig Dispense Refill  . acetaminophen (TYLENOL) 325 MG tablet Take 2 tablets (650 mg total) by mouth every 6 (six) hours as needed for headache. 30 tablet 1  . aspirin 81 MG EC tablet Take 1 tablet (81 mg total) by mouth daily with breakfast. 30 tablet 0  . atorvastatin (LIPITOR) 40 MG tablet TAKE 1 TABLET BY MOUTH EVERY DAY AT 6PM 90 tablet  1  . cyanocobalamin (,VITAMIN B-12,) 1000 MCG/ML injection Inject 1 mL (1,000 mcg total) into the muscle every 30 (thirty) days. For vitamin B12 deficiency 6 mL 2  . folic acid (FOLVITE) 1 MG tablet Take 1 tablet (1 mg total) by mouth daily. 30 tablet 2  . LORazepam (ATIVAN) 0.5 MG tablet Take 1 tablet (0.5 mg total) by mouth at bedtime as needed for anxiety. 30 tablet 0  . mirtazapine (REMERON) 15 MG tablet TAKE 1 TABLET BY MOUTH AT BEDTIME X 15DAYS, THEN INCREASE TO 2 TABS AT BEDTIME 135 tablet 1  . Multiple Vitamin (MULTIVITAMIN WITH MINERALS) TABS tablet Take 1 tablet by mouth daily. 30 tablet 2  . nicotine (NICODERM CQ - DOSED IN MG/24 HOURS) 21 mg/24hr patch Place 1 patch (21 mg total) onto the skin daily. 28 patch 0  . ondansetron (ZOFRAN) 4 MG tablet Take 1 tablet (4 mg total) by mouth every 6 (six) hours as needed for nausea. 20 tablet 0  . QUEtiapine (SEROQUEL) 200 MG tablet 1 tab po qhs 30 tablet 3  . thiamine (VITAMIN B-1) 100 MG tablet Take 1 tablet (100 mg total) by mouth daily. 30 tablet 0  . traZODone (DESYREL) 100 MG tablet Take 1 tablet (100 mg total) by mouth at bedtime. 90 tablet 1   No facility-administered medications prior to visit.    Allergies  Allergen Reactions  . Ambien [Zolpidem] Other (See Comments)    Pt took too much, acted out a bad dream/was throwing things, etc--ED visit (07/2017)    ROS As per HPI  PE: Vitals with BMI 05/12/2020 05/12/2020 05/12/2020  Height - - -  Weight - - -  BMI - - -  Systolic 0000000 - [id number]  Diastolic 81 - 87  Pulse 75 81 73     ***  LABS:    Chemistry      Component Value Date/Time   NA 138 05/12/2020 0451   NA 134 (A) 01/29/2017 0000   K 4.2 05/12/2020 0451   CL 103 05/12/2020 0451   CO2 27 05/12/2020 0451   BUN 29 (H) 05/12/2020 0451   BUN 28 (A) 01/29/2017 0000   CREATININE 1.29 (H) 05/12/2020 0451   CREATININE 1.01 04/03/2018 1528      Component Value Date/Time   CALCIUM 7.7 (L) 05/12/2020 0451   ALKPHOS 83  05/11/2020 0344   AST 15 05/11/2020 0344   ALT 12 05/11/2020 0344   BILITOT 0.5 05/11/2020 0344     Lab Results  Component Value Date   WBC 8.6 05/12/2020   HGB 11.0 (L) 05/12/2020   HCT 33.2 (L) 05/12/2020   MCV 101.5 (H) 05/12/2020   PLT 231 05/12/2020   Lab  Results  Component Value Date   VITAMINB12 165 (L) 05/09/2020   No results found for: IRON, TIBC, FERRITIN  Lab Results  Component Value Date   CHOL 136 12/10/2019   HDL 57.80 12/10/2019   LDLCALC 49 12/10/2019   TRIG 148.0 12/10/2019   CHOLHDL 2 12/10/2019   Lab Results  Component Value Date   TSH 0.778 05/09/2020    IMPRESSION AND PLAN:  No problem-specific Assessment & Plan notes found for this encounter.   An After Visit Summary was printed and given to the patient.  FOLLOW UP: No follow-ups on file.  Signed:  Crissie Sickles, MD           12/08/2020

## 2020-12-18 ENCOUNTER — Encounter: Payer: Self-pay | Admitting: Family Medicine

## 2020-12-25 ENCOUNTER — Other Ambulatory Visit: Payer: Self-pay

## 2020-12-26 ENCOUNTER — Telehealth: Payer: Self-pay | Admitting: Family Medicine

## 2020-12-26 NOTE — Telephone Encounter (Signed)
Left message for patient to schedule Annual Wellness Visit.  Please schedule with Nurse Health Advisor Martha Stanley, RN at [facility]  °

## 2020-12-28 ENCOUNTER — Ambulatory Visit (INDEPENDENT_AMBULATORY_CARE_PROVIDER_SITE_OTHER): Payer: Medicare Other | Admitting: Family Medicine

## 2020-12-28 ENCOUNTER — Encounter: Payer: Self-pay | Admitting: Family Medicine

## 2020-12-28 ENCOUNTER — Other Ambulatory Visit: Payer: Self-pay

## 2020-12-28 VITALS — BP 177/101 | HR 64 | Temp 97.8°F | Ht 67.0 in | Wt 177.0 lb

## 2020-12-28 DIAGNOSIS — I10 Essential (primary) hypertension: Secondary | ICD-10-CM | POA: Diagnosis not present

## 2020-12-28 DIAGNOSIS — G4700 Insomnia, unspecified: Secondary | ICD-10-CM | POA: Diagnosis not present

## 2020-12-28 DIAGNOSIS — I6930 Unspecified sequelae of cerebral infarction: Secondary | ICD-10-CM

## 2020-12-28 DIAGNOSIS — Z87448 Personal history of other diseases of urinary system: Secondary | ICD-10-CM | POA: Diagnosis not present

## 2020-12-28 MED ORDER — FELODIPINE ER 10 MG PO TB24: ORAL_TABLET | ORAL | 1 refills | Status: DC

## 2020-12-28 MED ORDER — QUETIAPINE FUMARATE 200 MG PO TABS: ORAL_TABLET | ORAL | 1 refills | Status: DC

## 2020-12-28 MED ORDER — TRAZODONE HCL 100 MG PO TABS: 100.0000 mg | ORAL_TABLET | Freq: Every day | ORAL | 1 refills | Status: DC

## 2020-12-28 NOTE — Progress Notes (Signed)
OFFICE VISIT  12/28/2020  CC:  Chief Complaint  Patient presents with  . Follow-up    RCI; pt not fasting; been out of BP meds for 2 weeks   HPI:    Patient is a 60 y.o. Caucasian male who presents for f/u HTN, anx/dep, hx of CVA with residual L arm spastic hemiparesis. I have not seen him since 04/21/20. Shortly after that visit he was admitted for MS changes, intoxicated, severe ARF and required emergency HD.  His ARF was in the setting of alc intox/dehyd, NSAID use, and ACE-I and diuretic use.  Creatinine was down to 1.29 on the day he left hospital.  Was taken off his bp meds in hosp and was to f/u as outpt to restart antihypertensives as approp but he never followed up--> After that he moved to [place] for a while.  Has been back a couple months now. Unclear what bp med he has taken most recently---says none in a few weeks though b/c ran out, says when he was in [place] his bp was normal ON NO MEDS so he did not take any/was not rx'd any by anyone there.  Says he went to an UC soon after getting to [facility] and says kidney function tested there was "completely normal". I have no medical records to corroborate anything he says about that.  Says when he came back to [place] a couple months ago he started to feel like bp was up again and checked it and it was approx 180 syst, says he then filled rx for a bp med (he doesn't know name) I had rx'd him in the past and started taking it and it came down to approx 140-150 per his best estimate.    ROS: no fevers, no CP, no SOB, no wheezing, no cough, no dizziness, no HAs, no rashes, no melena/hematochezia.  No polyuria or polydipsia.  No myalgias or arthralgias.  No new focal weakness, paresthesias, or tremors.  No acute vision or hearing abnormalities. No n/v/d or abd pain.  No palpitations.    PMP AWARE reviewed today: NOTHING LISTED.  Past Medical History:  Diagnosis Date  . Alcoholism ([place])    Continues to abuse ETOH as of 01/2017; pt has  poor insight into his problem.  Pt states that as of 04/2017 he has been 3 monthds sober.  . Anxiety and depression   . Arthritis    "hands, legs" (12/23/2017)  . Chronic joint pain   . COPD (chronic obstructive pulmonary disease) ([place])   . Depression   . Encounter for hemodialysis for acute renal failure ([place]) 04/2020   Poor PO intake/dehydration, with concurrent use of ACE-I, hctz, and NSAIDs. Emergency HD during hospitalization then d/c'd to SNF w sCr 1.3  . Frequent headaches    "depends on BP" (12/23/2017)  . History of kidney stones   . Hypertension   . Insomnia 08/26/2017   08/23/17 ED visit for adverse rxn to taking ambien, PLUS + marijuana on UDS her and in ED---NO FURTHER AMBIEN OR ANY OTHER CONTROLLED SUBSTANCES WILL BE PRESCRIBED BY ME--PM  . Pneumothorax, right   . Polysubstance abuse ([place])   . Poor historian    UNRELIABLE HISTORIAN  . 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  Baclofen oral no help.  Botox inj started by Dr. Posey Pronto 07/2018--no help x 1.  Inj #2 done 10/2018.  Marland Kitchen Stroke ([place]) 2012   LUE residual deficit:  flexion contracture and weakness of L hand.  Old bilateral cerebral infarcts involving the right frontal and parietal lobes as well as the left occipital lobe.  Baclofen and botox via neuro for L arm contracture.  . Stroke [facility])    "3 mini strokes since 2012" (12/23/2017)  . Subdural hematoma ([place]) 01/29/2017   Subacute right-sided frontal subdural hematoma: found on CT in [facility] ED, transferred to [facility] where this was non-operatively managed.  Pt admitted to the MDs at [facility] that he still drinks lots of whisky daily and they surmised that his frequent falls of late are due to this and frequent use of OTC sleep aids.  Near complete resolution on CT 03/27/18.  . Venous insufficiency of both lower extremities     Past Surgical History:  Procedure Laterality Date  . CHEST TUBE INSERTION    . LUNG SURGERY    .  SHOULDER OPEN ROTATOR CUFF REPAIR Bilateral    Multiple rotator cuff surgeries on both sides.  . TONSILLECTOMY    . TRANSTHORACIC ECHOCARDIOGRAM  09/24/2019   EF 55-60%, grd I DD.  Marland Kitchen TYMPANOPLASTY Right   . US CAROTIDS  09/24/2019   Minimal atherosclerotic plaque bilat; no flow obstruction  . VIDEO ASSISTED THORACOSCOPY (VATS) W/TALC PLEUADESIS  2010 X2    Outpatient Medications Prior to Visit  Medication Sig Dispense Refill  . QUEtiapine (SEROQUEL) 200 MG tablet 1 tab po qhs 30 tablet 3  . traZODone (DESYREL) 100 MG tablet Take 1 tablet (100 mg total) by mouth at bedtime. 90 tablet 1  . acetaminophen (TYLENOL) 325 MG tablet Take 2 tablets (650 mg total) by mouth every 6 (six) hours as needed for headache. (Patient not taking: Reported on 12/28/2020) 30 tablet 1  . aspirin 81 MG EC tablet Take 1 tablet (81 mg total) by mouth daily with breakfast. (Patient not taking: Reported on 12/28/2020) 30 tablet 0  . atorvastatin (LIPITOR) 40 MG tablet TAKE 1 TABLET BY MOUTH EVERY DAY AT 6PM (Patient not taking: Reported on 12/28/2020) 90 tablet 1  . folic acid (FOLVITE) 1 MG tablet Take 1 tablet (1 mg total) by mouth daily. (Patient not taking: Reported on 12/28/2020) 30 tablet 2  . Multiple Vitamin (MULTIVITAMIN WITH MINERALS) TABS tablet Take 1 tablet by mouth daily. (Patient not taking: Reported on 12/28/2020) 30 tablet 2  . nicotine (NICODERM CQ - DOSED IN MG/24 HOURS) 21 mg/24hr patch Place 1 patch (21 mg total) onto the skin daily. 28 patch 0  . cyanocobalamin (,VITAMIN B-12,) 1000 MCG/ML injection Inject 1 mL (1,000 mcg total) into the muscle every 30 (thirty) days. For vitamin B12 deficiency (Patient not taking: Reported on 12/28/2020) 6 mL 2  . LORazepam (ATIVAN) 0.5 MG tablet Take 1 tablet (0.5 mg total) by mouth at bedtime as needed for anxiety. (Patient not taking: Reported on 12/28/2020) 30 tablet 0  . mirtazapine (REMERON) 15 MG tablet TAKE 1 TABLET BY MOUTH AT BEDTIME X 15DAYS, THEN INCREASE TO 2 TABS  AT BEDTIME (Patient not taking: Reported on 12/28/2020) 135 tablet 1  . ondansetron (ZOFRAN) 4 MG tablet Take 1 tablet (4 mg total) by mouth every 6 (six) hours as needed for nausea. 20 tablet 0  . thiamine (VITAMIN B-1) 100 MG tablet Take 1 tablet (100 mg total) by mouth daily. (Patient not taking: Reported on 12/28/2020) 30 tablet 0   No facility-administered medications prior to visit.    Allergies  Allergen Reactions  . Ambien [Zolpidem] Other (See Comments)  Pt took too much, acted out a bad dream/was throwing things, etc--ED visit (07/2017)   ROS As per HPI  PE: Vitals with BMI 12/28/2020 12/28/2020 05/12/2020  Height - '5\' 7"'$  -  Weight - 177 lbs -  BMI - 0000000 -  Systolic [id number] 0000000  Diastolic 99991111 [id number] 81  Pulse 64 65 75     Gen: Alert, well appearing.  Patient is oriented to person, place, time, and situation. AFFECT: pleasant, lucid thought and speech. CV: RRR, no m/r/g.   LUNGS: CTA bilat, nonlabored resps, good aeration in all lung fields. EXT: no clubbing or cyanosis.  no edema.    LABS:    Chemistry      Component Value Date/Time   NA 138 05/12/2020 0451   NA 134 (A) 01/29/2017 0000   K 4.2 05/12/2020 0451   CL 103 05/12/2020 0451   CO2 27 05/12/2020 0451   BUN 29 (H) 05/12/2020 0451   BUN 28 (A) 01/29/2017 0000   CREATININE 1.29 (H) 05/12/2020 0451   CREATININE 1.01 04/03/2018 1528      Component Value Date/Time   CALCIUM 7.7 (L) 05/12/2020 0451   ALKPHOS 83 05/11/2020 0344   AST 15 05/11/2020 0344   ALT 12 05/11/2020 0344   BILITOT 0.5 05/11/2020 0344     Lab Results  Component Value Date   WBC 8.6 05/12/2020   HGB 11.0 (L) 05/12/2020   HCT 33.2 (L) 05/12/2020   MCV 101.5 (H) 05/12/2020   PLT 231 05/12/2020   Lab Results  Component Value Date   TSH 0.778 05/09/2020   IMPRESSION AND PLAN:  1) HTN: restart felodipine '10mg'$  qd.  I will check renal function today before starting ACE/ARB/diuretic. Checked with his pharmacy and they said he filled  lisinopril in Dec 2021 for 30d supply but nothing since then.   CBC and cmet ordered today (non-fasting).  2) Insomnia, chronic and severe: trazodone '100mg'$  qhs and quetiapine '200mg'$  qhs long term has been somewhat helpful.  He requests rx's for these today so I sent them in. No controlled substances.  3) Hx of severe acute renal failure 04/2020, required emergent HD. Pt was intoxicated, dehydrated, had been taking nsaids as well as ACE-I and thiazide. Cr 1.29 on day of d/c. Rechecking today, holding off on any meds that may alter renal function until I know results.  4) Hx of MDD: did not discuss today but he seemed in good spirits/no acute mood disorder symptoms.  Spent 35 min with pt today reviewing HPI, reviewing relevant past history, doing exam, reviewing and discussing lab and imaging data, and formulating plans.  An After Visit Summary was printed and given to the patient.  FOLLOW UP: Return in about 2 weeks (around 01/11/2021) for f/u HTN.  Signed:  Crissie Sickles, MD           12/28/2020

## 2020-12-29 LAB — COMPREHENSIVE METABOLIC PANEL
ALT: 19 U/L (ref 0–53)
AST: 18 U/L (ref 0–37)
Albumin: 4.2 g/dL (ref 3.5–5.2)
Alkaline Phosphatase: 67 U/L (ref 39–117)
BUN: 14 mg/dL (ref 6–23)
CO2: 27 mEq/L (ref 19–32)
Calcium: 9.5 mg/dL (ref 8.4–10.5)
Chloride: 105 mEq/L (ref 96–112)
Creatinine, Ser: 0.75 mg/dL (ref 0.40–1.50)
GFR: 97.23 mL/min (ref >60.00)
Glucose, Bld: 84 mg/dL (ref 70–99)
Potassium: 3.9 mEq/L (ref 3.5–5.1)
Sodium: 140 mEq/L (ref 135–145)
Total Bilirubin: 0.4 mg/dL (ref 0.2–1.2)
Total Protein: 6.9 g/dL (ref 6.0–8.3)

## 2020-12-29 LAB — CBC WITH DIFFERENTIAL/PLATELET
Basophils Absolute: 0.2 10*3/uL — ABNORMAL HIGH (ref 0.0–0.1)
Basophils Relative: 2.2 % (ref 0.0–3.0)
Eosinophils Absolute: 0.3 10*3/uL (ref 0.0–0.7)
Eosinophils Relative: 2.9 % (ref 0.0–5.0)
HCT: 41.2 % (ref 39.0–52.0)
Hemoglobin: 14.1 g/dL (ref 13.0–17.0)
Lymphocytes Relative: 17.4 % (ref 12.0–46.0)
Lymphs Abs: 1.5 10*3/uL (ref 0.7–4.0)
MCHC: 34.2 g/dL (ref 30.0–36.0)
MCV: 99.6 fl (ref 78.0–100.0)
Monocytes Absolute: 0.4 10*3/uL (ref 0.1–1.0)
Monocytes Relative: 5 % (ref 3.0–12.0)
Neutro Abs: 6.3 10*3/uL (ref 1.4–7.7)
Neutrophils Relative %: 72.5 % (ref 43.0–77.0)
Platelets: 354 10*3/uL (ref 150.0–400.0)
RBC: 4.14 Mil/uL — ABNORMAL LOW (ref 4.22–5.81)
RDW: 13.9 % (ref 11.5–15.5)
WBC: 8.7 10*3/uL (ref 4.0–10.5)

## 2021-01-01 ENCOUNTER — Telehealth: Payer: Self-pay | Admitting: Family Medicine

## 2021-01-01 NOTE — Telephone Encounter (Signed)
Patient requesting referral for in home physical therapy. He wants to be able to use Myopro which is a device to help stroke victims. Patient states he has difficulty speaking and expressing himself and if Dr. Anitra Lauth has questions about referral reasoning, please call Dr. Elayne Snare at [phone] or [phone].

## 2021-01-01 NOTE — Telephone Encounter (Signed)
Please advise, thanks.

## 2021-01-02 ENCOUNTER — Other Ambulatory Visit: Payer: Self-pay

## 2021-01-02 MED ORDER — LISINOPRIL 20 MG PO TABS
20.0000 mg | ORAL_TABLET | Freq: Every day | ORAL | 1 refills | Status: DC
Start: 2021-01-02 — End: 2021-02-02

## 2021-01-08 NOTE — Telephone Encounter (Signed)
Patient called back to check status of this referral. Please call patient to advise.

## 2021-01-08 NOTE — Telephone Encounter (Signed)
Please advise, thanks.

## 2021-01-08 NOTE — Telephone Encounter (Signed)
We can discuss this referral at a follow up appt. I need to see him back for f/u of his blood pressure in approx 1 wk.

## 2021-01-09 NOTE — Telephone Encounter (Signed)
Attempted to reach patient regarding referral, unable to leave message. VM is full.

## 2021-01-10 NOTE — Telephone Encounter (Signed)
Spoke with patient regarding follow up appointment to discuss referral. He thought we had an appt for him already scheduled for the referral and explained that PCP would like him to follow up first for blood pressure prior to anything else being done. He kindly thanked me, said "have a good day" and we both hung up.

## 2021-01-28 ENCOUNTER — Other Ambulatory Visit: Payer: Self-pay | Admitting: Family Medicine

## 2021-02-02 ENCOUNTER — Ambulatory Visit (INDEPENDENT_AMBULATORY_CARE_PROVIDER_SITE_OTHER): Payer: Medicare Other | Admitting: Family Medicine

## 2021-02-02 ENCOUNTER — Encounter: Payer: Self-pay | Admitting: Family Medicine

## 2021-02-02 ENCOUNTER — Other Ambulatory Visit: Payer: Self-pay

## 2021-02-02 VITALS — BP 136/91 | HR 76 | Temp 97.8°F | Resp 16 | Ht 67.0 in | Wt 180.8 lb

## 2021-02-02 DIAGNOSIS — I10 Essential (primary) hypertension: Secondary | ICD-10-CM | POA: Diagnosis not present

## 2021-02-02 DIAGNOSIS — I6930 Unspecified sequelae of cerebral infarction: Secondary | ICD-10-CM | POA: Diagnosis not present

## 2021-02-02 DIAGNOSIS — M24542 Contracture, left hand: Secondary | ICD-10-CM

## 2021-02-02 DIAGNOSIS — M79642 Pain in left hand: Secondary | ICD-10-CM

## 2021-02-02 LAB — BASIC METABOLIC PANEL
BUN: 14 mg/dL (ref 6–23)
CO2: 31 mEq/L (ref 19–32)
Calcium: 9.2 mg/dL (ref 8.4–10.5)
Chloride: 102 mEq/L (ref 96–112)
Creatinine, Ser: 0.88 mg/dL (ref 0.40–1.50)
GFR: 92.59 mL/min (ref >60.00)
Glucose, Bld: 77 mg/dL (ref 70–99)
Potassium: 3.9 mEq/L (ref 3.5–5.1)
Sodium: 139 mEq/L (ref 135–145)

## 2021-02-02 MED ORDER — LISINOPRIL 30 MG PO TABS: 30.0000 mg | ORAL_TABLET | Freq: Every day | ORAL | 1 refills | Status: DC

## 2021-02-02 MED ORDER — ATORVASTATIN CALCIUM 40 MG PO TABS: ORAL_TABLET | ORAL | 3 refills | Status: DC

## 2021-02-02 NOTE — Progress Notes (Signed)
OFFICE VISIT  02/02/2021  CC:  Chief Complaint  Patient presents with  . Follow-up    Hypertension,    HPI:    Patient is a 60 y.o. Caucasian male who presents for 1 month f/u HTN. A/P as of last visit: "1) HTN: restart felodipine '10mg'$  qd.  I will check renal function today before starting ACE/ARB/diuretic. Checked with his pharmacy and they said he filled lisinopril in Dec 2021 for 30d supply but nothing since then.   CBC and cmet ordered today (non-fasting).  2) Insomnia, chronic and severe: trazodone '100mg'$  qhs and quetiapine '200mg'$  qhs long term has been somewhat helpful.  He requests rx's for these today so I sent them in. No controlled substances.  3) Hx of severe acute renal failure 04/2020, required emergent HD. Pt was intoxicated, dehydrated, had been taking nsaids as well as ACE-I and thiazide. Cr 1.29 on day of d/c. Rechecking today, holding off on any meds that may alter renal function until I know results.  4) Hx of MDD: did not discuss today but he seemed in good spirits/no acute mood disorder symptoms."  INTERIM HX:  HTN: restarted felodipine '10mg'$  qd and lisinopril '20mg'$  qd last visit. His CMET and CBC were completely normal.  Feeling fine, no HAs or vision changes. Compliant with bp meds. No home bp measurements, though.  Asks again for PT for his left hand.  Seems to think PT can help him to get it to open up and be functional again even though his CVA that resulted in flexion contracture was a decade ago. He says he fell forward lost balance 6-7 mo ago when in [place] and fell onto L hand. Pain but pt not sure if it swelled.  He did not get medical care.   Says since then his L hand doesn't open as easily.  ROS as above, plus--> no fevers, no CP, no SOB, no wheezing, no cough, no dizziness, no HAs, no rashes, no melena/hematochezia.  No polyuria or polydipsia.  No myalgias or arthralgias.  No NEW focal weakness.  No paresthesias or tremors.  No acute vision  or hearing abnormalities.  No dysuria or unusual/new urinary urgency or frequency.  No recent changes in lower legs. No n/v/d or abd pain.  No palpitations.    Past Medical History:  Diagnosis Date  . Alcoholism ([place])    Continues to abuse ETOH as of 01/2017; pt has poor insight into his problem.  Pt states that as of 04/2017 he has been 3 monthds sober.  . Anxiety and depression   . Arthritis    "hands, legs" (12/23/2017)  . Chronic joint pain   . COPD (chronic obstructive pulmonary disease) ([place])   . Depression   . Encounter for hemodialysis for acute renal failure ([place]) 04/2020   Poor PO intake/dehydration, with concurrent use of ACE-I, hctz, and NSAIDs. Emergency HD during hospitalization then d/c'd to SNF w sCr 1.3  . Frequent headaches    "depends on BP" (12/23/2017)  . History of kidney stones   . Hypertension   . Insomnia 08/26/2017   08/23/17 ED visit for adverse rxn to taking ambien, PLUS + marijuana on UDS her and in ED---NO FURTHER AMBIEN OR ANY OTHER CONTROLLED SUBSTANCES WILL BE PRESCRIBED BY ME--PM  . Pneumothorax, right   . Polysubstance abuse ([place])   . Poor historian    UNRELIABLE HISTORIAN  . Spastic hemiparesis ([facility])    left arm (signif wrist contracture) due to R MCA region CVA in  2012.  Baclofen oral no help.  Botox inj started by Dr. Posey Pronto 07/2018--no help x 1.  Inj #2 done 10/2018.  Marland Kitchen Stroke ([place]) 2012   LUE residual deficit: flexion contracture and weakness of L hand.  Old bilateral cerebral infarcts involving the right frontal and parietal lobes as well as the left occipital lobe.  Baclofen and botox via neuro for L arm contracture.  . Stroke [facility])    "3 mini strokes since 2012" (12/23/2017)  . Subdural hematoma ([place]) 01/29/2017   Subacute right-sided frontal subdural hematoma: found on CT in [place] ED, transferred to [facility] where this was non-operatively managed.  Pt admitted to the MDs at [facility] that he still drinks lots of whisky daily and they surmised that his  frequent falls of late are due to this and frequent use of OTC sleep aids.  Near complete resolution on CT 03/27/18.  . Venous insufficiency of both lower extremities     Past Surgical History:  Procedure Laterality Date  . CHEST TUBE INSERTION    . LUNG SURGERY    . SHOULDER OPEN ROTATOR CUFF REPAIR Bilateral    Multiple rotator cuff surgeries on both sides.  . TONSILLECTOMY    . TRANSTHORACIC ECHOCARDIOGRAM  09/24/2019   EF 55-60%, grd I DD.  Marland Kitchen TYMPANOPLASTY Right   . US CAROTIDS  09/24/2019   Minimal atherosclerotic plaque bilat; no flow obstruction  . VIDEO ASSISTED THORACOSCOPY (VATS) W/TALC PLEUADESIS  2010 X2    Outpatient Medications Prior to Visit  Medication Sig Dispense Refill  . felodipine (PLENDIL) 10 MG 24 hr tablet 1 tab po qd 90 tablet 1  . QUEtiapine (SEROQUEL) 200 MG tablet 1 tab po qhs 90 tablet 1  . traZODone (DESYREL) 100 MG tablet Take 1 tablet (100 mg total) by mouth at bedtime. 90 tablet 1  . lisinopril (ZESTRIL) 20 MG tablet Take 1 tablet (20 mg total) by mouth daily. 30 tablet 1  . acetaminophen (TYLENOL) 325 MG tablet Take 2 tablets (650 mg total) by mouth every 6 (six) hours as needed for headache. (Patient not taking: No sig reported) 30 tablet 1  . aspirin 81 MG EC tablet Take 1 tablet (81 mg total) by mouth daily with breakfast. (Patient not taking: No sig reported) 30 tablet 0  . folic acid (FOLVITE) 1 MG tablet Take 1 tablet (1 mg total) by mouth daily. (Patient not taking: No sig reported) 30 tablet 2  . Multiple Vitamin (MULTIVITAMIN WITH MINERALS) TABS tablet Take 1 tablet by mouth daily. (Patient not taking: No sig reported) 30 tablet 2  . atorvastatin (LIPITOR) 40 MG tablet TAKE 1 TABLET BY MOUTH EVERY DAY AT 6PM (Patient not taking: No sig reported) 90 tablet 1   No facility-administered medications prior to visit.    Allergies  Allergen Reactions  . Ambien [Zolpidem] Other (See Comments)    Pt took too much, acted out a bad dream/was  throwing things, etc--ED visit (07/2017)    ROS As per HPI  PE: Vitals with BMI 02/02/2021 12/28/2020 12/28/2020  Height '5\' 7"'$  - '5\' 7"'$   Weight 180 lbs 13 oz - 177 lbs  BMI [id number] - 0000000  Systolic [id number]  Diastolic 91 [id number]  Pulse 76 64 65     Gen: Alert, well appearing.  Patient is oriented to person, place, time, and situation. AFFECT: pleasant, lucid thought and speech. CV: RRR, no m/r/g.   LUNGS: CTA bilat, nonlabored resps, good aeration in all lung fields. EXT:  no clubbing or cyanosis.  1+ bilat LL pitting edema.  Left wrist in flexion, unable to actively extend it. Left hand thumb and index finger are fixed in extension, and L hand middle, ring, and small finger are all fixed in flexion.   LABS:    Chemistry      Component Value Date/Time   NA 140 12/28/2020 1444   NA 134 (A) 01/29/2017 0000   K 3.9 12/28/2020 1444   CL 105 12/28/2020 1444   CO2 27 12/28/2020 1444   BUN 14 12/28/2020 1444   BUN 28 (A) 01/29/2017 0000   CREATININE 0.75 12/28/2020 1444   CREATININE 1.01 04/03/2018 1528      Component Value Date/Time   CALCIUM 9.5 12/28/2020 1444   ALKPHOS 67 12/28/2020 1444   AST 18 12/28/2020 1444   ALT 19 12/28/2020 1444   BILITOT 0.4 12/28/2020 1444     Lab Results  Component Value Date   WBC 8.7 12/28/2020   HGB 14.1 12/28/2020   HCT 41.2 12/28/2020   MCV 99.6 12/28/2020   PLT 354.0 12/28/2020   Lab Results  Component Value Date   CHOL 136 12/10/2019   HDL 57.80 12/10/2019   LDLCALC 49 12/10/2019   TRIG 148.0 12/10/2019   CHOLHDL 2 12/10/2019    IMPRESSION AND PLAN:  1) HTN: control much improved. Increase lisinopril to '30mg'$  qd.  Cont felodipine '10mg'$  qd. BMET today.  2) Hx of CVA with residual L wrist and hand contractures (2012). Now that bp near controlled will restart ASA '81mg'$ . Also restart atorvastatin '40mg'$  qd.  3) Left hand contracture deformity+ question of recent increased disability in L hand since fall onto hand 6-7 mo  ago. X-ray ordered. I told pt that PT is very unlikely to lead to increased ability to open fingers since CVA was such remote past event.    An After Visit Summary was printed and given to the patient.  FOLLOW UP: Return in about 4 weeks (around 03/02/2021).  Signed:  Crissie Sickles, MD           02/02/2021

## 2021-02-02 NOTE — Patient Instructions (Addendum)
I increased your lisinopril to 30 mg daily---I sent new rx to your pharmacy. Continue felodipine '10mg'$  daily. Start aspirin '81mg'$  (get over the counter) once every day. Restart atorvastatin--I sent prescription to your pharmacy.   X-ray of left hand ordered for [facility] radiology: appointment not needed. I'll see you back in office in 1 month.

## 2021-03-05 ENCOUNTER — Telehealth: Payer: Self-pay | Admitting: Family Medicine

## 2021-03-07 MED ORDER — LISINOPRIL 30 MG PO TABS: 30.0000 mg | ORAL_TABLET | Freq: Every day | ORAL | 1 refills | Status: DC

## 2021-03-07 NOTE — Telephone Encounter (Signed)
Please advise,  Pt states that he can not make it to the office until next month. He is "on a fixed income and it takes about an hour to get here." Pt states he isn't sure why he can't get a refill but says his blood pressure has been good since taking medication. He wants to know what to do in the meantime before his appointment because he is almost out of lisinopril. Pt is scheduled for OV on 03/29/21

## 2021-03-07 NOTE — Telephone Encounter (Signed)
OK, 90d supply lisinopril sent in. Keep f/u appt set for 6/3.-thx

## 2021-03-07 NOTE — Telephone Encounter (Signed)
Patient refill request.  Patient almost out of meds. He would like to get this med for 90d/s because of med is cheaper for him  lisinopril (ZESTRIL) 30 MG tablet [id number]    [id number] [place], [place]

## 2021-03-07 NOTE — Telephone Encounter (Signed)
LM for pt to return call regarding rx. Due to recent increase in med during last appt on 02/02/21, 90 d supply would not be appropriate. "FOLLOW UP: Return in about 4 weeks (around 03/02/2021)". He needs to schedule for his next follow up appt.

## 2021-03-07 NOTE — Telephone Encounter (Signed)
LM for pt to return call regarding rx and recommendations.

## 2021-03-07 NOTE — Addendum Note (Signed)
Addended by: Tammi Sou on: 03/07/2021 11:35 AM   Modules accepted: Orders

## 2021-03-08 NOTE — Telephone Encounter (Signed)
Spoke with pt regarding rx change in supply, he informed he would be out of town 6/3 so appt r/s to 6/10.

## 2021-03-09 ENCOUNTER — Other Ambulatory Visit: Payer: Self-pay | Admitting: Family Medicine

## 2021-03-12 NOTE — Telephone Encounter (Signed)
Pt called and requested a refill on QUEtiapine (SEROQUEL) 200 MG tablet

## 2021-03-13 NOTE — Telephone Encounter (Signed)
Last refill sent 12/28/20 #90 with 1 refill. He should contact the pharmacy regarding refill. LM for pt to return call regarding rx.

## 2021-03-27 ENCOUNTER — Other Ambulatory Visit: Payer: Self-pay | Admitting: Family Medicine

## 2021-03-30 ENCOUNTER — Ambulatory Visit: Payer: Medicare Other | Admitting: Family Medicine

## 2021-04-06 ENCOUNTER — Ambulatory Visit: Payer: Medicare Other | Admitting: Family Medicine

## 2021-04-06 DIAGNOSIS — Z0289 Encounter for other administrative examinations: Secondary | ICD-10-CM

## 2021-04-06 NOTE — Progress Notes (Deleted)
OFFICE VISIT  04/06/2021  CC: No chief complaint on file.   HPI:    Patient is a 60 y.o. Caucasian male who presents for 2 mo f/u HTN. A/P as of last visit: "1) HTN: control much improved. Increase lisinopril to '30mg'$  qd.  Cont felodipine '10mg'$  qd. BMET today.   2) Hx of CVA with residual L wrist and hand contractures (2012). Now that bp near controlled will restart ASA '81mg'$ . Also restart atorvastatin '40mg'$  qd.   3) Left hand contracture deformity+ question of recent increased disability in L hand since fall onto hand 6-7 mo ago. X-ray ordered. I told pt that PT is very unlikely to lead to increased ability to open fingers since CVA was such remote past event."  INTERIM HX: ***    Past Medical History:  Diagnosis Date   Alcoholism ([place])    Continues to abuse ETOH as of 01/2017; pt has poor insight into his problem.  Pt states that as of 04/2017 he has been 3 monthds sober.   Anxiety and depression    Arthritis    "hands, legs" (12/23/2017)   Chronic joint pain    COPD (chronic obstructive pulmonary disease) ([place])    Depression    Encounter for hemodialysis for acute renal failure ([place]) 04/2020   Poor PO intake/dehydration, with concurrent use of ACE-I, hctz, and NSAIDs. Emergency HD during hospitalization then d/c'd to SNF w sCr 1.3   Frequent headaches    "depends on BP" (12/23/2017)   History of kidney stones    Hypertension    Insomnia 08/26/2017   08/23/17 ED visit for adverse rxn to taking ambien, PLUS + marijuana on UDS her and in ED---NO FURTHER AMBIEN OR ANY OTHER CONTROLLED SUBSTANCES WILL BE PRESCRIBED BY ME--PM   Pneumothorax, right    Polysubstance abuse ([place])    Poor historian    UNRELIABLE HISTORIAN   Spastic hemiparesis ([place])    left arm (signif 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  Baclofen oral no help.  Botox inj started by Dr. Posey Pronto 07/2018--no help x 1.  Inj #2 done 10/2018.   Stroke ([place]) 2012   LUE residual deficit: flexion contracture  and weakness of L hand.  Old bilateral cerebral infarcts involving the right frontal and parietal lobes as well as the left occipital lobe.  Baclofen and botox via neuro for L arm contracture.   Stroke [facility])    "3 mini strokes since 2012" (12/23/2017)   Subdural hematoma ([place]) 01/29/2017   Subacute right-sided frontal subdural hematoma: found on CT in [place] ED, transferred to [facility] where this was non-operatively managed.  Pt admitted to the MDs at [facility] that he still drinks lots of whisky daily and they surmised that his frequent falls of late are due to this and frequent use of OTC sleep aids.  Near complete resolution on CT 03/27/18.   Venous insufficiency of both lower extremities     Past Surgical History:  Procedure Laterality Date   CHEST TUBE INSERTION     LUNG SURGERY     SHOULDER OPEN ROTATOR CUFF REPAIR Bilateral    Multiple rotator cuff surgeries on both sides.   TONSILLECTOMY     TRANSTHORACIC ECHOCARDIOGRAM  09/24/2019   EF 55-60%, grd I DD.   TYMPANOPLASTY Right    US CAROTIDS  09/24/2019   Minimal atherosclerotic plaque bilat; no flow obstruction   VIDEO ASSISTED THORACOSCOPY (VATS) W/TALC PLEUADESIS  2010 X2    Outpatient Medications Prior to  Visit  Medication Sig Dispense Refill   acetaminophen (TYLENOL) 325 MG tablet Take 2 tablets (650 mg total) by mouth every 6 (six) hours as needed for headache. (Patient not taking: No sig reported) 30 tablet 1   aspirin 81 MG EC tablet Take 1 tablet (81 mg total) by mouth daily with breakfast. (Patient not taking: No sig reported) 30 tablet 0   atorvastatin (LIPITOR) 40 MG tablet TAKE 1 TABLET BY MOUTH EVERY DAY 90 tablet 3   felodipine (PLENDIL) 10 MG 24 hr tablet 1 tab po qd 90 tablet 1   folic acid (FOLVITE) 1 MG tablet Take 1 tablet (1 mg total) by mouth daily. (Patient not taking: No sig reported) 30 tablet 2   lisinopril (ZESTRIL) 30 MG tablet Take 1 tablet (30 mg total) by mouth daily. 90 tablet 1   Multiple Vitamin  (MULTIVITAMIN WITH MINERALS) TABS tablet Take 1 tablet by mouth daily. (Patient not taking: No sig reported) 30 tablet 2   QUEtiapine (SEROQUEL) 200 MG tablet 1 tab po qhs 90 tablet 1   traZODone (DESYREL) 100 MG tablet Take 1 tablet (100 mg total) by mouth at bedtime. 90 tablet 1   No facility-administered medications prior to visit.    Allergies  Allergen Reactions   Ambien [Zolpidem] Other (See Comments)    Pt took too much, acted out a bad dream/was throwing things, etc--ED visit (07/2017)    ROS As per HPI  PE: Vitals with BMI 02/02/2021 12/28/2020 12/28/2020  Height '5\' 7"'$  - '5\' 7"'$   Weight 180 lbs 13 oz - 177 lbs  BMI A999333 - 0000000  Systolic [id number] [id number]  Diastolic 91 99991111 99991111  Pulse 76 64 65      LABS:    Chemistry      Component Value Date/Time   NA 139 02/02/2021 1348   NA 134 (A) 01/29/2017 0000   K 3.9 02/02/2021 1348   CL 102 02/02/2021 1348   CO2 31 02/02/2021 1348   BUN 14 02/02/2021 1348   BUN 28 (A) 01/29/2017 0000   CREATININE 0.88 02/02/2021 1348   CREATININE 1.01 04/03/2018 1528      Component Value Date/Time   CALCIUM 9.2 02/02/2021 1348   ALKPHOS 67 12/28/2020 1444   AST 18 12/28/2020 1444   ALT 19 12/28/2020 1444   BILITOT 0.4 12/28/2020 1444     Lab Results  Component Value Date   CHOL 136 12/10/2019   HDL 57.80 12/10/2019   LDLCALC 49 12/10/2019   TRIG 148.0 12/10/2019   CHOLHDL 2 12/10/2019     IMPRESSION AND PLAN:  No problem-specific Assessment & Plan notes found for this encounter.   An After Visit Summary was printed and given to the patient.  FOLLOW UP: No follow-ups on file.  Signed:  Crissie Sickles, MD           04/06/2021

## 2021-05-07 ENCOUNTER — Telehealth: Payer: Self-pay

## 2021-05-07 NOTE — Telephone Encounter (Signed)
Pt states he is supposed to be on '400mg'$  dose not '200mg'$ , they do not help.  Please advise

## 2021-05-07 NOTE — Telephone Encounter (Signed)
LM for pt to return call regarding rx. Medication last refilled 12/28/20 (90,1), he should have 1 refill remaining on file.

## 2021-05-07 NOTE — Telephone Encounter (Signed)
Patient refill request  QUEtiapine (SEROQUEL) 200 MG tablet [id number]   [id number] [place], [place]

## 2021-05-10 ENCOUNTER — Encounter: Payer: Self-pay | Admitting: Family Medicine

## 2021-05-10 MED ORDER — QUETIAPINE FUMARATE 400 MG PO TABS: 400.0000 mg | ORAL_TABLET | Freq: Every day | ORAL | 1 refills | Status: DC

## 2021-05-10 NOTE — Telephone Encounter (Signed)
OK, 400 mg quetiapine tabs eRx'd.

## 2021-05-10 NOTE — Telephone Encounter (Signed)
LM for pt to return call regarding rx, please encourage pt to schedule follow up as well.

## 2021-05-11 NOTE — Telephone Encounter (Signed)
LM for pt to return call regarding refill. If pt returns call, please inform and encourage to schedule follow up appt.

## 2021-05-21 ENCOUNTER — Encounter: Payer: Self-pay | Admitting: *Deleted

## 2021-05-21 ENCOUNTER — Telehealth: Payer: Self-pay | Admitting: Family Medicine

## 2021-05-21 ENCOUNTER — Emergency Department: Payer: Medicare Other

## 2021-05-21 ENCOUNTER — Other Ambulatory Visit: Payer: Self-pay

## 2021-05-21 ENCOUNTER — Observation Stay
Admission: EM | Admit: 2021-05-21 | Discharge: 2021-05-23 | Disposition: A | Discharge status: Home / Self Care | Payer: Medicare Other | Attending: Internal Medicine | Admitting: Internal Medicine

## 2021-05-21 DIAGNOSIS — Z720 Tobacco use: Secondary | ICD-10-CM | POA: Diagnosis present

## 2021-05-21 DIAGNOSIS — E512 Wernicke's encephalopathy: Secondary | ICD-10-CM | POA: Diagnosis present

## 2021-05-21 DIAGNOSIS — Y900 Blood alcohol level of less than 20 mg/100 ml: Secondary | ICD-10-CM | POA: Diagnosis not present

## 2021-05-21 DIAGNOSIS — R531 Weakness: Secondary | ICD-10-CM | POA: Diagnosis not present

## 2021-05-21 DIAGNOSIS — R4182 Altered mental status, unspecified: Secondary | ICD-10-CM | POA: Diagnosis present

## 2021-05-21 DIAGNOSIS — Z79899 Other long term (current) drug therapy: Secondary | ICD-10-CM | POA: Insufficient documentation

## 2021-05-21 DIAGNOSIS — E876 Hypokalemia: Secondary | ICD-10-CM | POA: Diagnosis present

## 2021-05-21 DIAGNOSIS — G9340 Encephalopathy, unspecified: Principal | ICD-10-CM | POA: Insufficient documentation

## 2021-05-21 DIAGNOSIS — Z7982 Long term (current) use of aspirin: Secondary | ICD-10-CM | POA: Diagnosis not present

## 2021-05-21 DIAGNOSIS — G9389 Other specified disorders of brain: Secondary | ICD-10-CM | POA: Diagnosis not present

## 2021-05-21 DIAGNOSIS — Z20822 Contact with and (suspected) exposure to covid-19: Secondary | ICD-10-CM | POA: Diagnosis not present

## 2021-05-21 DIAGNOSIS — J449 Chronic obstructive pulmonary disease, unspecified: Secondary | ICD-10-CM | POA: Diagnosis present

## 2021-05-21 DIAGNOSIS — I10 Essential (primary) hypertension: Secondary | ICD-10-CM | POA: Diagnosis present

## 2021-05-21 DIAGNOSIS — F17210 Nicotine dependence, cigarettes, uncomplicated: Secondary | ICD-10-CM | POA: Diagnosis not present

## 2021-05-21 DIAGNOSIS — I6930 Unspecified sequelae of cerebral infarction: Secondary | ICD-10-CM

## 2021-05-21 DIAGNOSIS — G319 Degenerative disease of nervous system, unspecified: Secondary | ICD-10-CM | POA: Diagnosis not present

## 2021-05-21 LAB — CBC WITH DIFFERENTIAL/PLATELET
Abs Immature Granulocytes: 0.03 10*3/uL (ref 0.00–0.07)
Basophils Absolute: 0.1 10*3/uL (ref 0.0–0.1)
Basophils Relative: 1 %
Eosinophils Absolute: 0.4 10*3/uL (ref 0.0–0.5)
Eosinophils Relative: 3 %
HCT: 41 % (ref 39.0–52.0)
Hemoglobin: 13.8 g/dL (ref 13.0–17.0)
Immature Granulocytes: 0 %
Lymphocytes Relative: 9 %
Lymphs Abs: 1.2 10*3/uL (ref 0.7–4.0)
MCH: 34.4 pg — ABNORMAL HIGH (ref 26.0–34.0)
MCHC: 33.7 g/dL (ref 30.0–36.0)
MCV: 102.2 fL — ABNORMAL HIGH (ref 80.0–100.0)
Monocytes Absolute: 0.7 10*3/uL (ref 0.1–1.0)
Monocytes Relative: 5 %
Neutro Abs: 10.8 10*3/uL — ABNORMAL HIGH (ref 1.7–7.7)
Neutrophils Relative %: 82 %
Platelets: 290 10*3/uL (ref 150–400)
RBC: 4.01 MIL/uL — ABNORMAL LOW (ref 4.22–5.81)
RDW: 13.4 % (ref 11.5–15.5)
WBC: 13.2 10*3/uL — ABNORMAL HIGH (ref 4.0–10.5)
nRBC: 0 % (ref 0.0–0.2)

## 2021-05-21 LAB — COMPREHENSIVE METABOLIC PANEL
ALT: 14 U/L (ref 0–44)
AST: 16 U/L (ref 15–41)
Albumin: 4.3 g/dL (ref 3.5–5.0)
Alkaline Phosphatase: 90 U/L (ref 38–126)
Anion gap: 10 (ref 5–15)
BUN: 12 mg/dL (ref 8–23)
CO2: 27 mmol/L (ref 22–32)
Calcium: 9.2 mg/dL (ref 8.9–10.3)
Chloride: 103 mmol/L (ref 98–111)
Creatinine, Ser: 0.94 mg/dL (ref 0.61–1.24)
GFR, Estimated: >60 mL/min (ref >60)
Glucose, Bld: 124 mg/dL — ABNORMAL HIGH (ref 70–99)
Potassium: 3.3 mmol/L — ABNORMAL LOW (ref 3.5–5.1)
Sodium: 140 mmol/L (ref 135–145)
Total Bilirubin: 0.6 mg/dL (ref 0.3–1.2)
Total Protein: 7.6 g/dL (ref 6.5–8.1)

## 2021-05-21 LAB — BLOOD GAS, VENOUS
Acid-base deficit: 5.8 mmol/L — ABNORMAL HIGH (ref 0.0–2.0)
Bicarbonate: 19.4 mmol/L — ABNORMAL LOW (ref 20.0–28.0)
O2 Saturation: 50.3 %
Patient temperature: 37
pCO2, Ven: 36 mmHg — ABNORMAL LOW (ref 44.0–60.0)
pH, Ven: 7.34 (ref 7.250–7.430)
pO2, Ven: <31 mmHg — CL (ref 32.0–45.0)

## 2021-05-21 LAB — ETHANOL: Alcohol, Ethyl (B): <10 mg/dL (ref <10)

## 2021-05-21 LAB — CK: Total CK: 135 U/L (ref 49–397)

## 2021-05-21 LAB — CBG MONITORING, ED: Glucose-Capillary: 122 mg/dL — ABNORMAL HIGH (ref 70–99)

## 2021-05-21 MED ORDER — NALOXONE HCL 2 MG/2ML IJ SOSY
PREFILLED_SYRINGE | INTRAMUSCULAR | Status: AC
  Administered 2021-05-21: 2 mg via INTRAVENOUS
  Filled 2021-05-21: qty 2

## 2021-05-21 MED ORDER — NALOXONE HCL 2 MG/2ML IJ SOSY: 2.0000 mg | PREFILLED_SYRINGE | Freq: Once | INTRAMUSCULAR | Status: AC

## 2021-05-21 NOTE — ED Provider Notes (Signed)
[facility] Emergency Department Provider Note  ____________________________________________   Event Date/Time   First MD Initiated Contact with Patient 05/21/21 2205     (approximate)  I have reviewed the triage vital signs and the nursing notes.   HISTORY  Chief Complaint No chief complaint on file.    HPI [name] is a 60 y.o. male with past medical history as below including history of polysubstance abuse, including chronic alcoholism, here with altered mental status.  The patient arrives with his friend.  The 2 were reportedly at church when the patient began to act "strange."  He began to slur his speech, seemed to have difficulty walking/loss of balance, and was confused.  He told his friend to take him home but he became less responsive while in the car so friend presents to the ED for evaluation.  Friend does not recall any specific drinking or substance use, though he does not specifically know.  He states the patient seemed like himself prior to church.  Remainder of history limited due to patient confusion on arrival.    Past Medical History:  Diagnosis Date   Alcoholism ([place])    Continues to abuse ETOH as of 01/2017; pt has poor insight into his problem.  Pt states that as of 04/2017 he has been 3 monthds sober.   Anxiety and depression    Arthritis    "hands, legs" (12/23/2017)   Chronic joint pain    COPD (chronic obstructive pulmonary disease) ([place])    Depression    Encounter for hemodialysis for acute renal failure ([place]) 04/2020   Poor PO intake/dehydration, with concurrent use of ACE-I, hctz, and NSAIDs. Emergency HD during hospitalization then d/c'd to SNF w sCr 1.3   Frequent headaches    "depends on BP" (12/23/2017)   History of kidney stones    Hypertension    Insomnia 08/26/2017   08/23/17 ED visit for adverse rxn to taking ambien, PLUS + marijuana on UDS her and in ED---NO FURTHER AMBIEN OR ANY OTHER CONTROLLED SUBSTANCES WILL  BE PRESCRIBED BY ME--PM   Pneumothorax, right    Polysubstance abuse ([place])    Poor historian    UNRELIABLE HISTORIAN   Spastic hemiparesis ([place])    left arm (signif 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  Baclofen oral no help.  Botox inj started by Dr. Posey Pronto 07/2018--no help x 1.  Inj #2 done 10/2018.   Stroke ([place]) 2012   LUE residual deficit: flexion contracture and weakness of L hand.  Old bilateral cerebral infarcts involving the right frontal and parietal lobes as well as the left occipital lobe.  Baclofen and botox via neuro for L arm contracture.   Stroke [facility])    "3 mini strokes since 2012" (12/23/2017)   Subdural hematoma ([place]) 01/29/2017   Subacute right-sided frontal subdural hematoma: found on CT in [place] ED, transferred to [facility] where this was non-operatively managed.  Pt admitted to the MDs at [facility] that he still drinks lots of whisky daily and they surmised that his frequent falls of late are due to this and frequent use of OTC sleep aids.  Near complete resolution on CT 03/27/18.   Venous insufficiency of both lower extremities     Patient Active Problem List   Diagnosis Date Noted   B12 deficiency 05/12/2020   ARF (acute renal failure) ([place]) [id number]   Acute metabolic encephalopathy Q000111Q   Alcohol abuse 05/09/2020   Orthostatic hypotension  Multiple falls    History of CVA with residual deficit    Syncope 09/23/2019   AKI (acute kidney injury) ([place])    Weakness    Unsteady gait    History of ischemic stroke    Hypomagnesemia    Hypokalemia    Wernicke's disease    Essential hypertension    Tobacco abuse counseling    Subdural hematoma ([place]) 12/22/2017   Depression    Tobacco abuse 11/08/2008   COPD (chronic obstructive pulmonary disease) ([place]) 11/08/2008   Pneumothorax 10/19/2008   PNEUMOTHORAX 10/19/2008    Past Surgical History:  Procedure Laterality Date   CHEST TUBE INSERTION     LUNG SURGERY     SHOULDER OPEN ROTATOR CUFF REPAIR  Bilateral    Multiple rotator cuff surgeries on both sides.   TONSILLECTOMY     TRANSTHORACIC ECHOCARDIOGRAM  09/24/2019   EF 55-60%, grd I DD.   TYMPANOPLASTY Right    US CAROTIDS  09/24/2019   Minimal atherosclerotic plaque bilat; no flow obstruction   VIDEO ASSISTED THORACOSCOPY (VATS) W/TALC PLEUADESIS  2010 X2    Prior to Admission medications   Medication Sig Start Date End Date Taking? Authorizing Provider  acetaminophen (TYLENOL) 325 MG tablet Take 2 tablets (650 mg total) by mouth every 6 (six) hours as needed for headache. Patient not taking: No sig reported 05/12/20   Roxan Hockey, MD  aspirin 81 MG EC tablet Take 1 tablet (81 mg total) by mouth daily with breakfast. Patient not taking: No sig reported 05/12/20   Roxan Hockey, MD  atorvastatin (LIPITOR) 40 MG tablet TAKE 1 TABLET BY MOUTH EVERY DAY 02/02/21   McGowen, Adrian Blackwater, MD  felodipine (PLENDIL) 10 MG 24 hr tablet 1 tab po qd 12/28/20   McGowen, Adrian Blackwater, MD  folic acid (FOLVITE) 1 MG tablet Take 1 tablet (1 mg total) by mouth daily. Patient not taking: No sig reported 05/13/20   Roxan Hockey, MD  lisinopril (ZESTRIL) 30 MG tablet Take 1 tablet (30 mg total) by mouth daily. 03/07/21   McGowen, Adrian Blackwater, MD  Multiple Vitamin (MULTIVITAMIN WITH MINERALS) TABS tablet Take 1 tablet by mouth daily. Patient not taking: No sig reported 05/13/20   Roxan Hockey, MD  QUEtiapine (SEROQUEL) 400 MG tablet Take 1 tablet (400 mg total) by mouth at bedtime. 05/10/21   McGowen, Adrian Blackwater, MD  traZODone (DESYREL) 100 MG tablet Take 1 tablet (100 mg total) by mouth at bedtime. 12/28/20   McGowen, Adrian Blackwater, MD    Allergies Ambien [zolpidem]  Family History  Problem Relation Age of Onset   Arthritis Mother    Heart disease Father    Hypertension Father    Breast cancer Maternal Aunt    Alcohol abuse Maternal Uncle    Lung cancer Maternal Aunt    Cancer Brother        bone    Social History Social History   Tobacco Use    Smoking status: Every Day    Packs/day: 1.00    Years: 44.00    Pack years: 44.00    Types: Cigarettes   Smokeless tobacco: Never  Vaping Use   Vaping Use: Former  Substance Use Topics   Alcohol use: No    Comment: Quit summer 2018. (12/23/2017)   Drug use: Yes    Frequency: 2.0 times per week    Types: Marijuana    Comment: 12/23/2017 "no more than once/day; ave 3-4 days/wk"    Review of Systems  Review  of Systems  Unable to perform ROS: Mental status change    ____________________________________________  PHYSICAL EXAM:      VITAL SIGNS: ED Triage Vitals  Enc Vitals Group     BP 05/21/21 2154 (!) 159/105     Pulse Rate 05/21/21 2154 75     Resp 05/21/21 2154 18     Temp 05/21/21 2154 98.3 F (36.8 C)     Temp Source 05/21/21 2154 Oral     SpO2 05/21/21 2154 95 %     Weight 05/21/21 2155 180 lb (81.6 kg)     Height 05/21/21 2155 '5\' 7"'$  (1.702 m)     Head Circumference --      Peak Flow --      Pain Score --      Pain Loc --      Pain Edu? --      Excl. in [place]? --      Physical Exam Vitals and nursing note reviewed.  Constitutional:      General: He is not in acute distress.    Appearance: He is well-developed.  HENT:     Head: Normocephalic and atraumatic.     Mouth/Throat:     Mouth: Mucous membranes are dry.  Eyes:     Conjunctiva/sclera: Conjunctivae normal.  Cardiovascular:     Rate and Rhythm: Normal rate and regular rhythm.     Heart sounds: Normal heart sounds.  Pulmonary:     Effort: Pulmonary effort is normal. Bradypnea present. No respiratory distress.     Breath sounds: No wheezing.     Comments: Bradypnea with transmitted upper airway sounds Abdominal:     General: There is no distension.  Musculoskeletal:     Cervical back: Neck supple.  Skin:    General: Skin is warm.     Capillary Refill: Capillary refill takes less than 2 seconds.     Findings: No rash.  Neurological:     Mental Status: He is lethargic.     GCS: GCS eye subscore  is 2. GCS verbal subscore is 2. GCS motor subscore is 4.     Motor: No abnormal muscle tone.      ____________________________________________   LABS (all labs ordered are listed, but only abnormal results are displayed)  Labs Reviewed  CBC WITH DIFFERENTIAL/PLATELET - Abnormal; Notable for the following components:      Result Value   WBC 13.2 (*)    RBC 4.01 (*)    MCV 102.2 (*)    MCH 34.4 (*)    Neutro Abs 10.8 (*)    All other components within normal limits  COMPREHENSIVE METABOLIC PANEL - Abnormal; Notable for the following components:   Potassium 3.3 (*)    Glucose, Bld 124 (*)    All other components within normal limits  BLOOD GAS, VENOUS - Abnormal; Notable for the following components:   pCO2, Ven 36 (*)    pO2, Ven <31.0 (*)    Bicarbonate 19.4 (*)    Acid-base deficit 5.8 (*)    All other components within normal limits  CBG MONITORING, ED - Abnormal; Notable for the following components:   Glucose-Capillary 122 (*)    All other components within normal limits  RESP PANEL BY RT-PCR (FLU A&B, COVID) ARPGX2  ETHANOL  CK  URINE DRUG SCREEN, QUALITATIVE ([facility] ONLY)  URINALYSIS, COMPLETE (UACMP) WITH MICROSCOPIC  MAGNESIUM    ____________________________________________  EKG: Normal sinus rhythm, ventricular rate 73.  PR 175, QRS 9 9, QTc  440.  No acute ST elevations or depressions. ________________________________________  RADIOLOGY All imaging, including plain films, CT scans, and ultrasounds, independently reviewed by me, and interpretations confirmed via formal radiology reads.  ED MD interpretation:   CT head: No acute abnormality Chest x-ray: Clear  Official radiology report(s): CT Head Wo Contrast  Result Date: 05/21/2021 CLINICAL DATA:  Altered mental status. EXAM: CT HEAD WITHOUT CONTRAST TECHNIQUE: Contiguous axial images were obtained from the base of the skull through the vertex without intravenous contrast. COMPARISON:  May 09, 2020  FINDINGS: Brain: There is mild cerebral atrophy with widening of the extra-axial spaces and ventricular dilatation. There are areas of decreased attenuation within the white matter tracts of the supratentorial brain, consistent with microvascular disease changes. Multiple areas of cortical encephalomalacia, with adjacent chronic white matter low attenuation, are seen within the left occipital lobe and throughout the right hemisphere. Vascular: No hyperdense vessel or unexpected calcification. Skull: Normal. Negative for fracture or focal lesion. Sinuses/Orbits: There is mild left maxillary sinus and bilateral ethmoid sinus mucosal thickening. Other: None. IMPRESSION: 1. No acute intracranial abnormality. 2. Generalized cerebral atrophy with multiple bilateral chronic infarcts. Electronically Signed   By: Virgina Norfolk M.D.   On: 05/21/2021 23:19   DG Chest Portable 1 View  Result Date: 05/22/2021 CLINICAL DATA:  Weakness. EXAM: PORTABLE CHEST 1 VIEW COMPARISON:  May 09, 2020 FINDINGS: Mildly decreased lung volumes are seen. Mild right apical pleural thickening is noted. There is no evidence of acute infiltrate, pleural effusion or pneumothorax. The heart size and mediastinal contours are within normal limits. The visualized skeletal structures are unremarkable. IMPRESSION: No active cardiopulmonary disease. Electronically Signed   By: Virgina Norfolk M.D.   On: 05/22/2021 00:06    ____________________________________________  PROCEDURES   Procedure(s) performed (including Critical Care):  Procedures  ____________________________________________  INITIAL IMPRESSION / MDM / [place] / ED COURSE  As part of my medical decision making, I reviewed the following data within the [place] notes reviewed and incorporated, Old chart reviewed, Notes from prior ED visits, and [place] Controlled Substance Database       *NORE [name] was evaluated in Emergency  Department on 05/22/2021 for the symptoms described in the history of present illness. He was evaluated in the context of the global COVID-19 pandemic, which necessitated consideration that the patient might be at risk for infection with the SARS-CoV-2 virus that causes COVID-19. Institutional protocols and algorithms that pertain to the evaluation of patients at risk for COVID-19 are in a state of rapid change based on information released by regulatory bodies including the CDC and federal and state organizations. These policies and algorithms were followed during the patient's care in the ED.  Some ED evaluations and interventions may be delayed as a result of limited staffing during the pandemic.*     Medical Decision Making: 60 year old male here with acute encephalopathy.  Differential is broad, primary consideration is possible polysubstance abuse.  However, the patient has a history of CVA, so will work-up for possible pontine other small stroke causing altered mental status.  Patient was significantly blurred apneic and hypoxic on arrival.  He did have some improvement with Narcan, raising question of opioid narcosis.  UDS has been ordered.  Lab work shows mild likely reactive leukocytosis.  Unclear whether he has had any infectious symptoms.  CK normal.  Blood gas without signs of significant retention.  CMP largely unremarkable.  Plan to monitor in the ED, follow-up  MRI for evaluation of occult stroke, and reassess.  ____________________________________________  FINAL CLINICAL IMPRESSION(S) / ED DIAGNOSES  Final diagnoses:  Acute encephalopathy     MEDICATIONS GIVEN DURING THIS VISIT:  Medications  potassium chloride 10 mEq in 100 mL IVPB (has no administration in time range)  lactated ringers bolus 1,000 mL (has no administration in time range)  naloxone [facility]) injection 2 mg (2 mg Intravenous Given 05/21/21 2213)     ED Discharge Orders     None        Note:  This document  was prepared using Dragon voice recognition software and may include unintentional dictation errors.   Duffy Bruce, MD 05/22/21 [phone]

## 2021-05-21 NOTE — ED Triage Notes (Addendum)
Pt was in church tonight and became altered.  Pt drowsy, diaphoretic.  Pt hard to arouse. Friend with pt.  fsbs 122 in triage

## 2021-05-21 NOTE — ED Provider Notes (Signed)
I assumed care of this patient approximately 2300.  PCF clinic for his note for full details regarding patient's initial evaluation assessment.  In brief patient presents with a history of EtOH abuse as well as polysubstance abuse reportedly became altered this afternoon.  He was appropriate church and his speech seemed to be slurred and he was acting bizarrely and walking seemingly off balance.  On arrival his fairly somnolent and unable provide any additional history.  Initial concerns for possible drug-related altered mental status is patiently initially had some response to Narcan.  However he became confused and more somnolent shortly after being aroused with Narcan.  Initial work-up including ED head and chest x-ray shows no clear acute derangements.  He does have a history of CVA and residual left-sided weakness there is evidence of this on CT head.  CMP shows a K of 3.3 without any other significant electrolyte or metabolic derangements.  CBC shows leukocytosis with out acute anemia or other significant derangements.  Ethanol is undetectable.  CK is unremarkable.  Magnesium is unremarkable.  UA shows no evidence of cystitis.  COVID influenza PCR is negative.  VBG without evidence of significant hypercarbic respiratory failure.  UDS positive for tricyclic's.  Plan is to obtain MRI and reassess.  On my reassessment patient is quite agitated not following commands writhing in his bed.  He was given sedation medication to safely undergo MRI.    MRI remarkable for "No acute intracranial abnormality. 2. Chronic right MCA and left PCA territory infarcts. 3. Underlying age-related cerebral atrophy with moderate chronic small vessel ischemic disease."  Given unremarkable MRI with otherwise unremarkable metabolic work-up without clear foci of infectious process or other clear source of altered mental status concern for possible encephalopathy from possible substance use.  After receiving sedation  medication patient is calm and sleeping peacefully with end-tidal in place and stable vitals on room air.  We will plan to allow some additional time to metabolize sedation medications including any additional illicit drugs he may have taken earlier.  Care patient signed over to oncoming provider approximately 0 700.  Plan is to reassess if patient does not have improving mental status likely admit for encephalopathy of unclear etiology.   Lucrezia Starch, MD 05/22/21 [phone]

## 2021-05-21 NOTE — Telephone Encounter (Signed)
Left message for patient to call back and schedule Medicare Annual Wellness Visit (AWV).   Please offer to do virtually or by telephone.   Last AWV:05/09/2017  Please schedule at anytime with Nurse Health Advisor.

## 2021-05-22 ENCOUNTER — Encounter: Payer: Self-pay | Admitting: Internal Medicine

## 2021-05-22 ENCOUNTER — Emergency Department: Payer: Medicare Other

## 2021-05-22 DIAGNOSIS — E876 Hypokalemia: Secondary | ICD-10-CM | POA: Diagnosis not present

## 2021-05-22 DIAGNOSIS — R400 Somnolence: Secondary | ICD-10-CM | POA: Diagnosis not present

## 2021-05-22 DIAGNOSIS — G319 Degenerative disease of nervous system, unspecified: Secondary | ICD-10-CM | POA: Diagnosis not present

## 2021-05-22 DIAGNOSIS — I10 Essential (primary) hypertension: Secondary | ICD-10-CM | POA: Diagnosis not present

## 2021-05-22 DIAGNOSIS — G9389 Other specified disorders of brain: Secondary | ICD-10-CM | POA: Diagnosis not present

## 2021-05-22 DIAGNOSIS — I6930 Unspecified sequelae of cerebral infarction: Secondary | ICD-10-CM | POA: Diagnosis not present

## 2021-05-22 DIAGNOSIS — R4182 Altered mental status, unspecified: Secondary | ICD-10-CM | POA: Diagnosis present

## 2021-05-22 DIAGNOSIS — G9340 Encephalopathy, unspecified: Secondary | ICD-10-CM | POA: Diagnosis not present

## 2021-05-22 LAB — URINALYSIS, COMPLETE (UACMP) WITH MICROSCOPIC
Bacteria, UA: NONE SEEN
Bilirubin Urine: NEGATIVE
Glucose, UA: NEGATIVE mg/dL
Hgb urine dipstick: NEGATIVE
Ketones, ur: 5 mg/dL — AB
Leukocytes,Ua: NEGATIVE
Nitrite: NEGATIVE
Protein, ur: NEGATIVE mg/dL
Specific Gravity, Urine: 1.009 (ref 1.005–1.030)
Squamous Epithelial / HPF: NONE SEEN (ref 0–5)
pH: 7 (ref 5.0–8.0)

## 2021-05-22 LAB — URINE DRUG SCREEN, QUALITATIVE (ARMC ONLY)
Amphetamines, Ur Screen: NOT DETECTED
Barbiturates, Ur Screen: NOT DETECTED
Benzodiazepine, Ur Scrn: NOT DETECTED
Cannabinoid 50 Ng, Ur ~~LOC~~: NOT DETECTED
Cocaine Metabolite,Ur ~~LOC~~: NOT DETECTED
MDMA (Ecstasy)Ur Screen: NOT DETECTED
Methadone Scn, Ur: NOT DETECTED
Opiate, Ur Screen: NOT DETECTED
Phencyclidine (PCP) Ur S: NOT DETECTED
Tricyclic, Ur Screen: POSITIVE — AB

## 2021-05-22 LAB — RESP PANEL BY RT-PCR (FLU A&B, COVID) ARPGX2
Influenza A by PCR: NEGATIVE
Influenza B by PCR: NEGATIVE
SARS Coronavirus 2 by RT PCR: NEGATIVE

## 2021-05-22 LAB — MAGNESIUM: Magnesium: 2.2 mg/dL (ref 1.7–2.4)

## 2021-05-22 LAB — PROCALCITONIN: Procalcitonin: <0.1 ng/mL

## 2021-05-22 MED ORDER — LACTATED RINGERS IV BOLUS
1000.0000 mL | Freq: Once | INTRAVENOUS | Status: AC
  Administered 2021-05-22: 1000 mL via INTRAVENOUS

## 2021-05-22 MED ORDER — POTASSIUM CHLORIDE 10 MEQ/100ML IV SOLN
INTRAVENOUS | Status: AC
  Administered 2021-05-22: 10 meq via INTRAVENOUS
  Filled 2021-05-22: qty 100

## 2021-05-22 MED ORDER — CHLORHEXIDINE GLUCONATE CLOTH 2 % EX PADS
6.0000 | MEDICATED_PAD | Freq: Every day | CUTANEOUS | Status: DC
  Administered 2021-05-22 – 2021-05-23 (×2): 6 via TOPICAL

## 2021-05-22 MED ORDER — TRAZODONE HCL 50 MG PO TABS
50.0000 mg | ORAL_TABLET | Freq: Every evening | ORAL | Status: DC | PRN
  Administered 2021-05-23: 50 mg via ORAL
  Filled 2021-05-22: qty 1

## 2021-05-22 MED ORDER — HALOPERIDOL LACTATE 5 MG/ML IJ SOLN: 5.0000 mg | Freq: Once | INTRAMUSCULAR | Status: AC

## 2021-05-22 MED ORDER — ENOXAPARIN SODIUM 40 MG/0.4ML IJ SOSY
40.0000 mg | PREFILLED_SYRINGE | INTRAMUSCULAR | Status: DC
  Administered 2021-05-22 – 2021-05-23 (×2): 40 mg via SUBCUTANEOUS
  Filled 2021-05-22 (×2): qty 0.4

## 2021-05-22 MED ORDER — KCL IN DEXTROSE-NACL 40-5-0.9 MEQ/L-%-% IV SOLN
INTRAVENOUS | Status: AC
  Filled 2021-05-22: qty 1000

## 2021-05-22 MED ORDER — ATORVASTATIN CALCIUM 20 MG PO TABS
40.0000 mg | ORAL_TABLET | Freq: Every evening | ORAL | Status: DC
  Administered 2021-05-22: 40 mg via ORAL
  Filled 2021-05-22: qty 2

## 2021-05-22 MED ORDER — FOLIC ACID 1 MG PO TABS
1.0000 mg | ORAL_TABLET | Freq: Every day | ORAL | Status: DC
  Administered 2021-05-22 – 2021-05-23 (×2): 1 mg via ORAL
  Filled 2021-05-22 (×2): qty 1

## 2021-05-22 MED ORDER — POTASSIUM CHLORIDE 10 MEQ/100ML IV SOLN
10.0000 meq | INTRAVENOUS | Status: AC
  Administered 2021-05-22: 10 meq via INTRAVENOUS
  Filled 2021-05-22: qty 100

## 2021-05-22 MED ORDER — THIAMINE HCL 100 MG/ML IJ SOLN
100.0000 mg | Freq: Once | INTRAMUSCULAR | Status: AC
  Administered 2021-05-22: 100 mg via INTRAVENOUS
  Filled 2021-05-22: qty 2

## 2021-05-22 MED ORDER — NICOTINE 21 MG/24HR TD PT24
21.0000 mg | MEDICATED_PATCH | Freq: Every day | TRANSDERMAL | Status: DC
  Administered 2021-05-23: 21 mg via TRANSDERMAL
  Filled 2021-05-22: qty 1

## 2021-05-22 MED ORDER — ASPIRIN EC 81 MG PO TBEC
81.0000 mg | DELAYED_RELEASE_TABLET | Freq: Every day | ORAL | Status: DC
  Administered 2021-05-23: 81 mg via ORAL
  Filled 2021-05-22: qty 1

## 2021-05-22 MED ORDER — HALOPERIDOL LACTATE 5 MG/ML IJ SOLN
INTRAMUSCULAR | Status: AC
  Administered 2021-05-22: 5 mg via INTRAVENOUS
  Filled 2021-05-22: qty 1

## 2021-05-22 MED ORDER — ADULT MULTIVITAMIN W/MINERALS CH
1.0000 | ORAL_TABLET | Freq: Every day | ORAL | Status: DC
  Administered 2021-05-22 – 2021-05-23 (×2): 1 via ORAL
  Filled 2021-05-22 (×2): qty 1

## 2021-05-22 MED ORDER — LORAZEPAM 2 MG/ML IJ SOLN: 1.0000 mg | Freq: Once | INTRAMUSCULAR | Status: AC

## 2021-05-22 MED ORDER — DIPHENHYDRAMINE HCL 50 MG/ML IJ SOLN: 50.0000 mg | Freq: Once | INTRAMUSCULAR | Status: AC

## 2021-05-22 MED ORDER — DIPHENHYDRAMINE HCL 50 MG/ML IJ SOLN
INTRAMUSCULAR | Status: AC
  Administered 2021-05-22: 50 mg via INTRAVENOUS
  Filled 2021-05-22: qty 1

## 2021-05-22 MED ORDER — NICOTINE 14 MG/24HR TD PT24
14.0000 mg | MEDICATED_PATCH | Freq: Every day | TRANSDERMAL | Status: DC
  Administered 2021-05-22: 14 mg via TRANSDERMAL
  Filled 2021-05-22: qty 1

## 2021-05-22 MED ORDER — ONDANSETRON HCL 4 MG PO TABS: 4.0000 mg | ORAL_TABLET | Freq: Four times a day (QID) | ORAL | Status: DC | PRN

## 2021-05-22 MED ORDER — ACETAMINOPHEN 325 MG PO TABS
650.0000 mg | ORAL_TABLET | Freq: Four times a day (QID) | ORAL | Status: DC | PRN
  Administered 2021-05-23 (×2): 650 mg via ORAL
  Filled 2021-05-22 (×2): qty 2

## 2021-05-22 MED ORDER — ONDANSETRON HCL 4 MG/2ML IJ SOLN: 4.0000 mg | Freq: Four times a day (QID) | INTRAMUSCULAR | Status: DC | PRN

## 2021-05-22 MED ORDER — TRAZODONE HCL 50 MG PO TABS: 50.0000 mg | ORAL_TABLET | Freq: Every day | ORAL | Status: DC

## 2021-05-22 MED ORDER — LORAZEPAM 2 MG/ML IJ SOLN
INTRAMUSCULAR | Status: AC
  Administered 2021-05-22: 1 mg via INTRAVENOUS
  Filled 2021-05-22: qty 1

## 2021-05-22 MED ORDER — FELODIPINE ER 10 MG PO TB24
10.0000 mg | ORAL_TABLET | Freq: Every day | ORAL | Status: DC
  Administered 2021-05-22 – 2021-05-23 (×2): 10 mg via ORAL
  Filled 2021-05-22 (×2): qty 1

## 2021-05-22 MED ORDER — LISINOPRIL 20 MG PO TABS
20.0000 mg | ORAL_TABLET | Freq: Every day | ORAL | Status: DC
  Administered 2021-05-22 – 2021-05-23 (×2): 20 mg via ORAL
  Filled 2021-05-22 (×2): qty 1

## 2021-05-22 NOTE — H&P (Signed)
History and Physical    [name] [id number] DOB: 08/30/1961 DOA: 05/21/2021  PCP: Tammi Sou, MD   Patient coming from: Home  I have personally briefly reviewed patient's old medical records in [place]  Chief Complaint: Dizzy  HPI: [name] is a 60 y.o. male with medical history significant for CVA with left-sided hemiparesis, hypertension, COPD, depression and anxiety who was brought into the emergency room by his friend for evaluation after he became altered at church.  Per triage note patient arrived drowsy, diaphoretic and difficult to arouse.  His blood sugar in triage was 122.  Patient's friend states that his speech was slurred, he was confused and had difficulty ambulating.  He had asked his friend to take him home but when he became less responsive his friend decided to bring him to the emergency room.  According to his friend he was at his baseline mental status prior to going to church and he did not recall him drinking anything on using any illicit drugs. Patient states that he was at church when he suddenly felt dizzy and lightheaded.  He states his friend brought him to the hospital but is unable to provide any more history after that. When he arrived to the emergency room he received a dose of Narcan because he was somnolent with some improvement in his mental status which was transient and then patient became more confused and agitated. He received Haldol, lorazepam and Benadryl in the ER for agitation. During my evaluation patient is awake, alert and oriented to person and place.  He remembers feeling dizzy at church but is unable to tell me anything else. He denies having any headache, no chest pain, no nausea, no vomiting, no dizziness, no lightheadedness, no palpitations, no shortness of breath, no urinary symptoms or any changes in his bowel habits. Venous blood gas 7.34/36/<31/19.4/50 Sodium 140, potassium 3.3, chloride 103, bicarb 27, glucose 124,  BUN 12, creatinine 0.94, calcium 9.2, magnesium 2.2, alkaline phosphatase 90, albumin 4.3, AST 16, ALT 14, total protein 7.6, total CK1 35, procalcitonin less than 0.10, white count 13.2, hemoglobin 13.8, hematocrit 41.0, MCV 102, RDW 13.4, platelet count 290 Respiratory viral panel is negative CT scan of the head without contrast showed no acute intracranial abnormality.  Generalized cerebral atrophy with multiple bilateral chronic infarcts. Chest x-ray reviewed by me shows no evidence of acute cardiopulmonary disease MRI of the brain without contrast shows no acute intracranial abnormality.  Chronic right MCA and left PCA territory infarcts. Underlying age-related cerebral atrophy with moderate chronic small vessel ischemic disease. Twelve-lead EKG reviewed by me shows sinus rhythm.   ED Course: Patient is a 60 year old who was brought into the emergency room by his friend for evaluation of sudden onset change in mental status and somnolence. He received a dose of Narcan in the ER with transient improvement in his mental status but then patient became very agitated requiring Haldol, lorazepam and Benadryl. He is less agitated and more awake this morning but not back to baseline and will be referred to observation status for further evaluation.     Review of Systems: As per HPI otherwise all other systems reviewed and negative.    Past Medical History:  Diagnosis Date   Alcoholism ([place])    Continues to abuse ETOH as of 01/2017; pt has poor insight into his problem.  Pt states that as of 04/2017 he has been 3 monthds sober.   Anxiety and depression    Arthritis    "  hands, legs" (12/23/2017)   Chronic joint pain    COPD (chronic obstructive pulmonary disease) ([place])    Depression    Encounter for hemodialysis for acute renal failure ([place]) 04/2020   Poor PO intake/dehydration, with concurrent use of ACE-I, hctz, and NSAIDs. Emergency HD during hospitalization then d/c'd to SNF w sCr 1.3    Frequent headaches    "depends on BP" (12/23/2017)   History of kidney stones    Hypertension    Insomnia 08/26/2017   08/23/17 ED visit for adverse rxn to taking ambien, PLUS + marijuana on UDS her and in ED---NO FURTHER AMBIEN OR ANY OTHER CONTROLLED SUBSTANCES WILL BE PRESCRIBED BY ME--PM   Pneumothorax, right    Polysubstance abuse ([place])    Poor historian    UNRELIABLE HISTORIAN   Spastic hemiparesis ([place])    left arm (signif 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  Baclofen oral no help.  Botox inj started by Dr. Posey Pronto 07/2018--no help x 1.  Inj #2 done 10/2018.   Stroke ([place]) 2012   LUE residual deficit: flexion contracture and weakness of L hand.  Old bilateral cerebral infarcts involving the right frontal and parietal lobes as well as the left occipital lobe.  Baclofen and botox via neuro for L arm contracture.   Stroke [facility])    "3 mini strokes since 2012" (12/23/2017)   Subdural hematoma ([place]) 01/29/2017   Subacute right-sided frontal subdural hematoma: found on CT in [place] ED, transferred to [facility] where this was non-operatively managed.  Pt admitted to the MDs at [facility] that he still drinks lots of whisky daily and they surmised that his frequent falls of late are due to this and frequent use of OTC sleep aids.  Near complete resolution on CT 03/27/18.   Venous insufficiency of both lower extremities     Past Surgical History:  Procedure Laterality Date   CHEST TUBE INSERTION     LUNG SURGERY     SHOULDER OPEN ROTATOR CUFF REPAIR Bilateral    Multiple rotator cuff surgeries on both sides.   TONSILLECTOMY     TRANSTHORACIC ECHOCARDIOGRAM  09/24/2019   EF 55-60%, grd I DD.   TYMPANOPLASTY Right    US CAROTIDS  09/24/2019   Minimal atherosclerotic plaque bilat; no flow obstruction   VIDEO ASSISTED THORACOSCOPY (VATS) W/TALC PLEUADESIS  2010 X2     reports that he has been smoking cigarettes. He has a 44.00 pack-year smoking history. He has never used smokeless tobacco.  He reports current drug use. Frequency: 2.00 times per week. Drug: Marijuana. He reports that he does not drink alcohol.  Allergies  Allergen Reactions   Ambien [Zolpidem] Other (See Comments)    Pt took too much, acted out a bad dream/was throwing things, etc--ED visit (07/2017)    Family History  Problem Relation Age of Onset   Arthritis Mother    Heart disease Father    Hypertension Father    Breast cancer Maternal Aunt    Alcohol abuse Maternal Uncle    Lung cancer Maternal Aunt    Cancer Brother        bone      Prior to Admission medications   Medication Sig Start Date End Date Taking? Authorizing Provider  acetaminophen (TYLENOL) 325 MG tablet Take 2 tablets (650 mg total) by mouth every 6 (six) hours as needed for headache. Patient not taking: No sig reported 05/12/20   Roxan Hockey, MD  aspirin 81 MG EC tablet  Take 1 tablet (81 mg total) by mouth daily with breakfast. Patient not taking: No sig reported 05/12/20   Roxan Hockey, MD  atorvastatin (LIPITOR) 40 MG tablet TAKE 1 TABLET BY MOUTH EVERY DAY 02/02/21   McGowen, Adrian Blackwater, MD  felodipine (PLENDIL) 10 MG 24 hr tablet 1 tab po qd 12/28/20   McGowen, Adrian Blackwater, MD  folic acid (FOLVITE) 1 MG tablet Take 1 tablet (1 mg total) by mouth daily. Patient not taking: No sig reported 05/13/20   Roxan Hockey, MD  lisinopril (ZESTRIL) 30 MG tablet Take 1 tablet (30 mg total) by mouth daily. 03/07/21   McGowen, Adrian Blackwater, MD  Multiple Vitamin (MULTIVITAMIN WITH MINERALS) TABS tablet Take 1 tablet by mouth daily. Patient not taking: No sig reported 05/13/20   Roxan Hockey, MD  QUEtiapine (SEROQUEL) 400 MG tablet Take 1 tablet (400 mg total) by mouth at bedtime. 05/10/21   McGowen, Adrian Blackwater, MD  traZODone (DESYREL) 100 MG tablet Take 1 tablet (100 mg total) by mouth at bedtime. 12/28/20   Tammi Sou, MD    Physical Exam: Vitals:   05/22/21 0730 05/22/21 0800 05/22/21 0830 05/22/21 1013  BP: (!) 160/89 (!) 178/92 (!)  134/118 (!) 152/103  Pulse: (!) 51 (!) 51 80 (!) 58  Resp: 12 (!) '9 12 13  '$ Temp:      TempSrc:      SpO2: 100% 100% 98% 98%  Weight:      Height:         Vitals:   05/22/21 0730 05/22/21 0800 05/22/21 0830 05/22/21 1013  BP: (!) 160/89 (!) 178/92 (!) 134/118 (!) 152/103  Pulse: (!) 51 (!) 51 80 (!) 58  Resp: 12 (!) '9 12 13  '$ Temp:      TempSrc:      SpO2: 100% 100% 98% 98%  Weight:      Height:          Constitutional: Alert and oriented x 2, person and place but not to time. Not in any apparent distress HEENT:      Head: Normocephalic and atraumatic.         Eyes: PERLA, EOMI, Conjunctivae are normal. Sclera is non-icteric.       Mouth/Throat: Mucous membranes are moist.       Neck: Supple with no signs of meningismus. Cardiovascular: Regular rate and rhythm. No murmurs, gallops, or rubs. 2+ symmetrical distal pulses are present . No JVD. No LE edema Respiratory: Respiratory effort normal .Lungs sounds clear bilaterally. No wheezes, crackles, or rhonchi.  Gastrointestinal: Soft, non tender, and non distended with positive bowel sounds.  Genitourinary: No CVA tenderness. Musculoskeletal: Nontender with normal range of motion in all extremities.  Contracted left upper extremity. Neurologic:  Face is symmetric. Moving all extremities.  Contracted left upper extremity from prior stroke. Skin: Skin is warm, dry.  No rash or ulcers Psychiatric: Mood and affect are normal    Labs on Admission: I have personally reviewed following labs and imaging studies  CBC: Recent Labs  Lab 05/21/21 2235  WBC 13.2*  NEUTROABS 10.8*  HGB 13.8  HCT 41.0  MCV 102.2*  PLT [id number]   Basic Metabolic Panel: Recent Labs  Lab 05/21/21 2235  NA 140  K 3.3*  CL 103  CO2 27  GLUCOSE 124*  BUN 12  CREATININE 0.94  CALCIUM 9.2  MG 2.2   GFR: Estimated Creatinine Clearance: 83.3 mL/min (by C-G formula based on SCr of 0.94 mg/dL). Liver Function Tests:  Recent Labs  Lab 05/21/21 2235   AST 16  ALT 14  ALKPHOS 90  BILITOT 0.6  PROT 7.6  ALBUMIN 4.3   No results for input(s): LIPASE, AMYLASE in the last 168 hours. No results for input(s): AMMONIA in the last 168 hours. Coagulation Profile: No results for input(s): INR, PROTIME in the last 168 hours. Cardiac Enzymes: Recent Labs  Lab 05/21/21 2235  CKTOTAL 135   BNP (last 3 results) No results for input(s): PROBNP in the last 8760 hours. HbA1C: No results for input(s): HGBA1C in the last 72 hours. CBG: Recent Labs  Lab 05/21/21 2154  GLUCAP 122*   Lipid Profile: No results for input(s): CHOL, HDL, LDLCALC, TRIG, CHOLHDL, LDLDIRECT in the last 72 hours. Thyroid Function Tests: No results for input(s): TSH, T4TOTAL, FREET4, T3FREE, THYROIDAB in the last 72 hours. Anemia Panel: No results for input(s): VITAMINB12, FOLATE, FERRITIN, TIBC, IRON, RETICCTPCT in the last 72 hours. Urine analysis:    Component Value Date/Time   COLORURINE STRAW (A) 05/21/2021 2312   APPEARANCEUR CLEAR (A) 05/21/2021 2312   LABSPEC 1.009 05/21/2021 2312   PHURINE 7.0 05/21/2021 2312   GLUCOSEU NEGATIVE 05/21/2021 2312   HGBUR NEGATIVE 05/21/2021 2312   BILIRUBINUR NEGATIVE 05/21/2021 2312   KETONESUR 5 (A) 05/21/2021 2312   PROTEINUR NEGATIVE 05/21/2021 2312   UROBILINOGEN 0.2 03/24/2011 2019   NITRITE NEGATIVE 05/21/2021 2312   LEUKOCYTESUR NEGATIVE 05/21/2021 2312    Radiological Exams on Admission: CT Head Wo Contrast  Result Date: 05/21/2021 CLINICAL DATA:  Altered mental status. EXAM: CT HEAD WITHOUT CONTRAST TECHNIQUE: Contiguous axial images were obtained from the base of the skull through the vertex without intravenous contrast. COMPARISON:  May 09, 2020 FINDINGS: Brain: There is mild cerebral atrophy with widening of the extra-axial spaces and ventricular dilatation. There are areas of decreased attenuation within the white matter tracts of the supratentorial brain, consistent with microvascular disease changes.  Multiple areas of cortical encephalomalacia, with adjacent chronic white matter low attenuation, are seen within the left occipital lobe and throughout the right hemisphere. Vascular: No hyperdense vessel or unexpected calcification. Skull: Normal. Negative for fracture or focal lesion. Sinuses/Orbits: There is mild left maxillary sinus and bilateral ethmoid sinus mucosal thickening. Other: None. IMPRESSION: 1. No acute intracranial abnormality. 2. Generalized cerebral atrophy with multiple bilateral chronic infarcts. Electronically Signed   By: Virgina Norfolk M.D.   On: 05/21/2021 23:19   MR BRAIN WO CONTRAST  Result Date: 05/22/2021 CLINICAL DATA:  Initial evaluation for acute altered mental status. EXAM: MRI HEAD WITHOUT CONTRAST TECHNIQUE: Multiplanar, multiecho pulse sequences of the brain and surrounding structures were obtained without intravenous contrast. COMPARISON:  Prior CT from 05/21/2021. FINDINGS: Brain: Generalized age-related cerebral atrophy. Patchy T2/FLAIR hyperintensity within the periventricular and deep white matter both cerebral hemispheres most consistent with chronic small vessel ischemic disease, moderate in nature. Remote lacunar infarct present at the right caudate head. Extensive encephalomalacia and gliosis involving the right frontal, parietal, and occipital lobes consistent with a chronic ischemic infarct, largely right MCA distribution. Additional chronic left PCA territory infarct noted involving the left occipital lobe. Mild scattered chronic hemosiderin staining noted about these areas of remote ischemia. No abnormal foci of restricted diffusion to suggest acute or subacute ischemia. Gray-white matter differentiation otherwise maintained. No other evidence for acute or chronic intracranial hemorrhage. No mass lesion, midline shift or mass effect. Mild ex vacuo dilatation of the right lateral ventricle related to the chronic right cerebral ischemic change. No hydrocephalus.  No extra-axial fluid collection. Pituitary gland suprasellar region within normal limits. Midline structures intact. Vascular: Major intracranial vascular flow voids are maintained. Skull and upper cervical spine: Craniocervical junction within normal limits. Bone marrow signal intensity normal. No scalp soft tissue abnormality. Sinuses/Orbits: Globes and orbital soft tissues demonstrate no acute finding. Mild scattered mucosal thickening noted within the frontoethmoidal and maxillary sinuses, greater on the left. No mastoid effusion. Inner ear structures grossly normal. Other: None. IMPRESSION: 1. No acute intracranial abnormality. 2. Chronic right MCA and left PCA territory infarcts. 3. Underlying age-related cerebral atrophy with moderate chronic small vessel ischemic disease. Electronically Signed   By: Jeannine Boga M.D.   On: 05/22/2021 03:53   DG Chest Portable 1 View  Result Date: 05/22/2021 CLINICAL DATA:  Weakness. EXAM: PORTABLE CHEST 1 VIEW COMPARISON:  May 09, 2020 FINDINGS: Mildly decreased lung volumes are seen. Mild right apical pleural thickening is noted. There is no evidence of acute infiltrate, pleural effusion or pneumothorax. The heart size and mediastinal contours are within normal limits. The visualized skeletal structures are unremarkable. IMPRESSION: No active cardiopulmonary disease. Electronically Signed   By: Virgina Norfolk M.D.   On: 05/22/2021 00:06     Assessment/Plan Principal Problem:   AMS (altered mental status) Active Problems:   Tobacco abuse   COPD (chronic obstructive pulmonary disease) (HCC)   Hypokalemia   Wernicke's disease   Essential hypertension   History of CVA with residual deficit     Altered mental status Unclear etiology Patient presents to the emergency room for evaluation of sudden onset mental status change, somnolence and confusion. He received a dose of Narcan in the ER with transient improvement in his mental status but then  became agitated and received Haldol, lorazepam and Benadryl. He is currently more awake and alert but not back to baseline and is only oriented to person and place We will continue to monitor mental status Patient denies continued alcohol use (talk to daughter who states that she is unsure if he has quit completely but that he has problems with alcohol abuse) Will administer IV thiamine and switch to p.o. once patient is able to tolerate oral intake Continue folic acid     Nicotine dependence Smoking cessation has been discussed with patient in detail We will place patient on a nicotine transdermal patch 14 mg daily    History of prior CVA with left upper extremity hemiparesis Continue aspirin and atorvastatin    Hypertension Continue felodipine and lisinopril    Hypokalemia Supplement potassium  DVT prophylaxis: Lovenox  Code Status: full code  Family Communication: Greater than 50% of time was spent discussing patient's condition and plan of care with his daughter over the phone.  All questions and concerns have been addressed.  She verbalizes understanding and agrees with the plan. Disposition Plan: Back to previous home environment Consults called: Physical therapy Status: Observation    Keahi Mccarney MD [facility]     05/22/2021, 11:10 AM

## 2021-05-22 NOTE — Progress Notes (Signed)
Patient with cell phone and wallet that were packaged by ED and intended to be sent to security for safe keeping.  Security arrived on unit to take possession of property and wanted patient signature and belongings in new package.  Belongings verified with security and patient.  Patient thanked [place] for keeping his property safe and states that he needs his wallet and cell phone in room with him.  Offered to keep property in package in hospital safe and patient refused.  Cell phone and wallet left with patient in room.

## 2021-05-22 NOTE — ED Notes (Signed)
Benadryl was not given, charted as given by error

## 2021-05-22 NOTE — ED Notes (Signed)
Hospitalist in room with patient

## 2021-05-22 NOTE — ED Notes (Signed)
14 french foley inserted, with a clear return of urine

## 2021-05-22 NOTE — ED Notes (Signed)
Pt noted to be acutely agitated, kicking himself around the bed, EDP Tamala Julian, this RN and Mikki Santee,. RN at bedside at this time.

## 2021-05-22 NOTE — ED Notes (Signed)
Called security numerous times yo secure belongings  without any answer.  Patient has a wallet without any cash, but looks like a bank card.  Patient also has a cell phone

## 2021-05-22 NOTE — ED Notes (Signed)
ED MD informed that the patient was covid +

## 2021-05-22 NOTE — Plan of Care (Signed)
  Problem: Education: Goal: Knowledge of General Education information will improve Description: Including pain rating scale, medication(s)/side effects and non-pharmacologic comfort measures Outcome: Progressing   Problem: Activity: Goal: Risk for activity intolerance will decrease Outcome: Progressing   Problem: Coping: Goal: Level of anxiety will decrease Outcome: Progressing   Problem: Elimination: Goal: Will not experience complications related to bowel motility Outcome: Progressing Goal: Will not experience complications related to urinary retention Outcome: Progressing   Problem: Pain Managment: Goal: General experience of comfort will improve Outcome: Progressing   

## 2021-05-22 NOTE — ED Notes (Signed)
Patient eating lunch tray. Unsteady with hands with 2 spilled drinks. Needs setup

## 2021-05-23 DIAGNOSIS — I10 Essential (primary) hypertension: Secondary | ICD-10-CM | POA: Diagnosis not present

## 2021-05-23 DIAGNOSIS — E876 Hypokalemia: Secondary | ICD-10-CM | POA: Diagnosis not present

## 2021-05-23 DIAGNOSIS — R400 Somnolence: Secondary | ICD-10-CM | POA: Diagnosis not present

## 2021-05-23 LAB — BASIC METABOLIC PANEL
Anion gap: 9 (ref 5–15)
BUN: 9 mg/dL (ref 8–23)
CO2: 29 mmol/L (ref 22–32)
Calcium: 9.7 mg/dL (ref 8.9–10.3)
Chloride: 99 mmol/L (ref 98–111)
Creatinine, Ser: 0.69 mg/dL (ref 0.61–1.24)
GFR, Estimated: >60 mL/min (ref >60)
Glucose, Bld: 119 mg/dL — ABNORMAL HIGH (ref 70–99)
Potassium: 4.1 mmol/L (ref 3.5–5.1)
Sodium: 137 mmol/L (ref 135–145)

## 2021-05-23 LAB — CBC
HCT: 47.5 % (ref 39.0–52.0)
Hemoglobin: 15.8 g/dL (ref 13.0–17.0)
MCH: 34 pg (ref 26.0–34.0)
MCHC: 33.3 g/dL (ref 30.0–36.0)
MCV: 102.2 fL — ABNORMAL HIGH (ref 80.0–100.0)
Platelets: 351 10*3/uL (ref 150–400)
RBC: 4.65 MIL/uL (ref 4.22–5.81)
RDW: 13.2 % (ref 11.5–15.5)
WBC: 9.8 10*3/uL (ref 4.0–10.5)
nRBC: 0 % (ref 0.0–0.2)

## 2021-05-23 LAB — HIV ANTIBODY (ROUTINE TESTING W REFLEX): HIV Screen 4th Generation wRfx: NONREACTIVE

## 2021-05-23 LAB — MAGNESIUM: Magnesium: 2.1 mg/dL (ref 1.7–2.4)

## 2021-05-23 MED ORDER — OXYCODONE-ACETAMINOPHEN 5-325 MG PO TABS: 1.0000 | ORAL_TABLET | Freq: Once | ORAL | Status: DC

## 2021-05-23 NOTE — TOC Progression Note (Signed)
Transition of Care [facility]) - Progression Note    Patient Details  Name: [name] MRN: [id number] Date of Birth: 1961/07/08  Transition of Care [facility]) CM/SW [place], RN Phone Number: 05/23/2021, 2:28 PM  Clinical Narrative:   Patient states he lives at home alone, but he has assistance from a friend if he needs it.  He does not currently have home health, and feels he doesn't need it at this time.  PT states no needs at discharge.   He denies issues with transporting to appointments and getting medications, declines other TOC needs at this time.       Expected Discharge Plan and Services           Expected Discharge Date: 05/23/21                                     Social Determinants of Health (SDOH) Interventions    Readmission Risk Interventions No flowsheet data found.

## 2021-05-23 NOTE — Discharge Summary (Signed)
Physician Discharge Summary  Patient ID: [name] MRN: [id number] DOB/AGE: May 20, 1961 60 y.o.  Admit date: 05/21/2021 Discharge date: 05/23/2021  Admission Diagnoses:  Discharge Diagnoses:  Principal Problem:   AMS (altered mental status) Active Problems:   Tobacco abuse   COPD (chronic obstructive pulmonary disease) (HCC)   Hypokalemia   Wernicke's disease   Essential hypertension   History of CVA with residual deficit   Discharged Condition: good  Hospital Course:  [name] is a 60 y.o. male with medical history significant for CVA with left-sided hemiparesis, hypertension, COPD, depression and anxiety who was brought into the emergency room by his friend for evaluation after he became altered at church.  Per triage note patient arrived drowsy, diaphoretic and difficult to arouse. Overnight, patient condition has improved.  Currently patient does not have any confusion.  I reviewed patient CT and MRI of the brain, patient has significant generalized atrophy as well as old stroke.  He may have developed mild dementia.  I do not suspect a TIA or stroke. At this point, patient be discharged home in a stable condition.  Patient be follow-up with PCP, I will also refer patient to neurology as outpatient.    Consults: None  Significant Diagnostic Studies:   MRI HEAD WITHOUT CONTRAST   TECHNIQUE: Multiplanar, multiecho pulse sequences of the brain and surrounding structures were obtained without intravenous contrast.   COMPARISON:  Prior CT from 05/21/2021.   FINDINGS: Brain: Generalized age-related cerebral atrophy. Patchy T2/FLAIR hyperintensity within the periventricular and deep white matter both cerebral hemispheres most consistent with chronic small vessel ischemic disease, moderate in nature. Remote lacunar infarct present at the right caudate head. Extensive encephalomalacia and gliosis involving the right frontal, parietal, and occipital lobes consistent with  a chronic ischemic infarct, largely right MCA distribution. Additional chronic left PCA territory infarct noted involving the left occipital lobe. Mild scattered chronic hemosiderin staining noted about these areas of remote ischemia.   No abnormal foci of restricted diffusion to suggest acute or subacute ischemia. Gray-white matter differentiation otherwise maintained. No other evidence for acute or chronic intracranial hemorrhage.   No mass lesion, midline shift or mass effect. Mild ex vacuo dilatation of the right lateral ventricle related to the chronic right cerebral ischemic change. No hydrocephalus. No extra-axial fluid collection. Pituitary gland suprasellar region within normal limits. Midline structures intact.   Vascular: Major intracranial vascular flow voids are maintained.   Skull and upper cervical spine: Craniocervical junction within normal limits. Bone marrow signal intensity normal. No scalp soft tissue abnormality.   Sinuses/Orbits: Globes and orbital soft tissues demonstrate no acute finding. Mild scattered mucosal thickening noted within the frontoethmoidal and maxillary sinuses, greater on the left. No mastoid effusion. Inner ear structures grossly normal.   Other: None.   IMPRESSION: 1. No acute intracranial abnormality. 2. Chronic right MCA and left PCA territory infarcts. 3. Underlying age-related cerebral atrophy with moderate chronic small vessel ischemic disease.     Electronically Signed   By: Jeannine Boga M.D.   On: 05/22/2021 03:53   CT HEAD WITHOUT CONTRAST   TECHNIQUE: Contiguous axial images were obtained from the base of the skull through the vertex without intravenous contrast.   COMPARISON:  May 09, 2020   FINDINGS: Brain: There is mild cerebral atrophy with widening of the extra-axial spaces and ventricular dilatation. There are areas of decreased attenuation within the white matter tracts of the supratentorial brain,  consistent with microvascular disease changes.   Multiple areas  of cortical encephalomalacia, with adjacent chronic white matter low attenuation, are seen within the left occipital lobe and throughout the right hemisphere.   Vascular: No hyperdense vessel or unexpected calcification.   Skull: Normal. Negative for fracture or focal lesion.   Sinuses/Orbits: There is mild left maxillary sinus and bilateral ethmoid sinus mucosal thickening.   Other: None.   IMPRESSION: 1. No acute intracranial abnormality. 2. Generalized cerebral atrophy with multiple bilateral chronic infarcts.     Electronically Signed   By: Virgina Norfolk M.D.   On: 05/21/2021 23:19  Treatments: clinical observation  Discharge Exam: Blood pressure (!) 145/82, pulse 68, temperature 97.8 F (36.6 C), temperature source Oral, resp. rate (!) 22, height '5\' 7"'$  (1.702 m), weight 81.6 kg, SpO2 97 %. General appearance: alert and cooperative Resp: clear to auscultation bilaterally Cardio: regular rate and rhythm, S1, S2 normal, no murmur, click, rub or gallop GI: soft, non-tender; bowel sounds normal; no masses,  no organomegaly Extremities: extremities normal, atraumatic, no cyanosis or edema  Disposition: Discharge disposition: 01-Home or Self Care       Discharge Instructions     Diet - low sodium heart healthy   Complete by: As directed    Increase activity slowly   Complete by: As directed       Allergies as of 05/23/2021       Reactions   Ambien [zolpidem] Other (See Comments)   Pt took too much, acted out a bad dream/was throwing things, etc--ED visit (07/2017)        Medication List     STOP taking these medications    acetaminophen 325 MG tablet Commonly known as: TYLENOL   folic acid 1 MG tablet Commonly known as: FOLVITE   multivitamin with minerals Tabs tablet       TAKE these medications    aspirin 81 MG EC tablet Take 1 tablet (81 mg total) by mouth daily with  breakfast.   atorvastatin 40 MG tablet Commonly known as: LIPITOR TAKE 1 TABLET BY MOUTH EVERY DAY   felodipine 10 MG 24 hr tablet Commonly known as: PLENDIL 1 tab po qd   lisinopril 20 MG tablet Commonly known as: ZESTRIL Take 20 mg by mouth daily.   QUEtiapine 400 MG tablet Commonly known as: SEROQUEL Take 1 tablet (400 mg total) by mouth at bedtime.   traZODone 100 MG tablet Commonly known as: DESYREL Take 1 tablet (100 mg total) by mouth at bedtime.        Follow-up Information     McGowen, Adrian Blackwater, MD Follow up in 1 week(s).   Specialty: Family Medicine Contact information: [id number] [place] [address] [phone]         [facility] Follow up in 2 week(s).   Contact information: [place] [phone]                Signed: Sharen Hones 05/23/2021, 10:53 AM

## 2021-05-23 NOTE — Evaluation (Signed)
Physical Therapy Evaluation Patient Details Name: [name] MRN: [id number] DOB: [birth year] Today's Date: 05/23/2021   History of Present Illness  presented to ER secondary to acute onset of dizziness, AMS, slurred speech and decreased responsiveness; admitted for TIA/CVA work up.  MRI negative for acute findings (noted chrnoic MCA/PCA infarcts)  Clinical Impression  Patient resting in bed upon arrival to room; alert and oriented, follows commands and agreeable to participation with session.  Fair delayed recall of new information; able to appropriately problem-solve emergency conditions without difficulty.  Endorses marked improvement in symptoms since admission.  Strength and ROM testing significant for baseline hemiparesis throughout L hemi-body (UE > LE), unchanged with this admission.  Currently able to complete bed mobility with mod indep; sit/stand, basic transfers and gait (200') without assist device, sup/mod indep.  Gait pattern significant for reciprocal stepping pattern with exaggerated weight shift to R LE, decreased hip/knee/ankle movement throughout gait cycle; maintains L UE in flexed position; occasional sway with head turns, but self-corrects with LE step strategy Appears to be at baseline level of functional ability with no acute change in ability noted; patient in agreement.  Will complete PT order at this time; please re-consult should needs change.  Safe for discharge home with medically appropriate; no follow up therapy needs at this time.    Follow Up Recommendations No PT follow up    Equipment Recommendations       Recommendations for Other Services       Precautions / Restrictions Precautions Precautions: Fall Restrictions Weight Bearing Restrictions: No      Mobility  Bed Mobility Overal bed mobility: Modified Independent                  Transfers Overall transfer level: Needs assistance Equipment used: None Transfers: Sit to/from Stand Sit to  Stand: Supervision         General transfer comment: excessive weight shift to R LE (learned non-use of L LE)  Ambulation/Gait Ambulation/Gait assistance: Supervision Gait Distance (Feet): 200 Feet Assistive device: None       General Gait Details: reciprocal stepping pattern with exaggerated weight shift to R LE, decreased hip/knee/ankle movement throughout gait cycle; maintains L UE in flexed position; occasional sway with head turns, but self-corrects with LE step strategy  Stairs            Wheelchair Mobility    Modified Rankin (Stroke Patients Only)       Balance Overall balance assessment: Modified Independent Sitting-balance support: No upper extremity supported;Feet supported       Standing balance support: No upper extremity supported Standing balance-Leahy Scale: Good                               Pertinent Vitals/Pain Pain Assessment: No/denies pain    Home Living Family/patient expects to be discharged to:: Private residence Living Arrangements: Alone Available Help at Discharge: Family;Available PRN/intermittently Type of Home: House Home Access: Stairs to enter Entrance Stairs-Rails: Right;Left;Can reach both (typically uses R rail due to L hemiparesis) Entrance Stairs-Number of Steps: 4-5 Home Layout: One level Home Equipment: Cane - single point      Prior Function Level of Independence: Independent         Comments: Indep with ADLs, household and community mobilization without assist device; denies fall history in previous year.  Enjoys working on cars/body work in garage of home.     Hand Dominance  Dominant Hand: Right    Extremity/Trunk Assessment   Upper Extremity Assessment Upper Extremity Assessment:  (baseline L UE hemiparesis with mild anterior subluxation of humeral head, flexion contracture of L elbow, fisted contracture of L hand (with excessive hyperext of PIP/DIPs to L hand))    Lower Extremity  Assessment Lower Extremity Assessment:  (baseline hemiparesis to L LE, grossly 3+ to 4-/5 strength throughout)       Communication   Communication: No difficulties  Cognition Arousal/Alertness: Awake/alert Behavior During Therapy: WFL for tasks assessed/performed Overall Cognitive Status: Within Functional Limits for tasks assessed                                        General Comments      Exercises     Assessment/Plan    PT Assessment Patent does not need any further PT services  PT Problem List Decreased strength;Decreased activity tolerance;Decreased balance;Decreased mobility       PT Treatment Interventions Gait training;Functional mobility training;Therapeutic activities;Balance training;Neuromuscular re-education;Therapeutic exercise;Patient/family education    PT Goals (Current goals can be found in the Care Plan section)  Acute Rehab PT Goals Patient Stated Goal: to return home PT Goal Formulation: All assessment and education complete, DC therapy Time For Goal Achievement: 05/23/21 Potential to Achieve Goals: Good    Frequency  (Eval only)   Barriers to discharge        Co-evaluation               AM-PAC PT "6 Clicks" Mobility  Outcome Measure Help needed turning from your back to your side while in a flat bed without using bedrails?: None Help needed moving from lying on your back to sitting on the side of a flat bed without using bedrails?: None Help needed moving to and from a bed to a chair (including a wheelchair)?: None Help needed standing up from a chair using your arms (e.g., wheelchair or bedside chair)?: None Help needed to walk in hospital room?: None Help needed climbing 3-5 steps with a railing? : None 6 Click Score: 24    End of Session Equipment Utilized During Treatment: Gait belt Activity Tolerance: Patient tolerated treatment well Patient left: in chair;with call bell/phone within reach;with chair alarm  set Nurse Communication: Mobility status PT Visit Diagnosis: Other symptoms and signs involving the nervous system (R29.898)    Time: [id number] PT Time Calculation (min) (ACUTE ONLY): 32 min   Charges:   PT Evaluation $PT Eval Moderate Complexity: 1 Mod          Elihue Ebert H. Owens Shark, PT, DPT, NCS 05/23/21, 6:06 PM [phone]

## 2021-05-24 ENCOUNTER — Other Ambulatory Visit: Payer: Self-pay | Admitting: Family Medicine

## 2021-05-28 ENCOUNTER — Other Ambulatory Visit: Payer: Self-pay | Admitting: Family Medicine

## 2021-05-29 ENCOUNTER — Inpatient Hospital Stay: Payer: Medicare Other | Admitting: Family Medicine

## 2021-05-31 ENCOUNTER — Telehealth: Payer: Self-pay | Admitting: Family Medicine

## 2021-05-31 NOTE — Telephone Encounter (Signed)
Patient called to schedule follow up visit. He requested VV. Previous AVS notes in office and pt was just in the hospital. Advised pt that he will need to be seen in person. He said that he doesn't have transportation and cannot afford to come in. I was advising that [facility] offers transportation services and pt ended the call.

## 2021-06-01 NOTE — Telephone Encounter (Signed)
LM for pt to returncall

## 2021-06-01 NOTE — Telephone Encounter (Signed)
Virtual for HTN f/u okay.

## 2021-06-01 NOTE — Telephone Encounter (Signed)
Pt has missed the last 2 scheduled appts. Unsure if this would be considered TCM or can be done as VV  Please advise, thanks.

## 2021-06-04 NOTE — Telephone Encounter (Signed)
LM for pt to return call. If pt returns call, please advise of below

## 2021-06-22 ENCOUNTER — Other Ambulatory Visit: Payer: Self-pay | Admitting: Family Medicine

## 2021-07-16 ENCOUNTER — Telehealth: Payer: Self-pay

## 2021-07-16 NOTE — Telephone Encounter (Signed)
LVM for pt to CB to sched AWV.

## 2021-07-17 ENCOUNTER — Other Ambulatory Visit: Payer: Self-pay

## 2021-07-17 ENCOUNTER — Telehealth: Payer: Self-pay

## 2021-07-17 MED ORDER — FELODIPINE ER 10 MG PO TB24: ORAL_TABLET | ORAL | 0 refills | Status: DC

## 2021-07-17 NOTE — Telephone Encounter (Signed)
30 d supply sent to pharmacy provided. LM for pt to return call

## 2021-07-17 NOTE — Telephone Encounter (Signed)
Patient returned call from last month, he said he left a message and was following up with his question. OV approved for virtual per Dr. Anitra Lauth. Scheduled virtual visit for 9/29.  Patient has refill request. Patient states he is out of meds.  [facility] [place]  felodipine (PLENDIL) 10 MG 24 hr tablet [id number]

## 2021-07-18 ENCOUNTER — Other Ambulatory Visit: Payer: Self-pay | Admitting: Family Medicine

## 2021-07-18 NOTE — Telephone Encounter (Signed)
LVM for pt to CB regarding medication.  ?

## 2021-07-22 ENCOUNTER — Other Ambulatory Visit: Payer: Self-pay | Admitting: Family Medicine

## 2021-07-26 ENCOUNTER — Telehealth (INDEPENDENT_AMBULATORY_CARE_PROVIDER_SITE_OTHER): Payer: Medicare Other | Admitting: Family Medicine

## 2021-07-26 ENCOUNTER — Other Ambulatory Visit: Payer: Self-pay | Admitting: Family Medicine

## 2021-07-26 ENCOUNTER — Encounter: Payer: Self-pay | Admitting: Family Medicine

## 2021-07-26 ENCOUNTER — Other Ambulatory Visit: Payer: Self-pay

## 2021-07-26 VITALS — BP 160/89

## 2021-07-26 DIAGNOSIS — G9340 Encephalopathy, unspecified: Secondary | ICD-10-CM

## 2021-07-26 DIAGNOSIS — F5101 Primary insomnia: Secondary | ICD-10-CM | POA: Diagnosis not present

## 2021-07-26 DIAGNOSIS — I10 Essential (primary) hypertension: Secondary | ICD-10-CM | POA: Diagnosis not present

## 2021-07-26 MED ORDER — QUETIAPINE FUMARATE 400 MG PO TABS: 400.0000 mg | ORAL_TABLET | Freq: Every day | ORAL | 1 refills | Status: DC

## 2021-07-26 MED ORDER — LISINOPRIL 40 MG PO TABS: 40.0000 mg | ORAL_TABLET | Freq: Every day | ORAL | 1 refills | Status: DC

## 2021-07-26 MED ORDER — TRAZODONE HCL 100 MG PO TABS: ORAL_TABLET | ORAL | 1 refills | Status: DC

## 2021-07-26 MED ORDER — FELODIPINE ER 10 MG PO TB24: ORAL_TABLET | ORAL | 1 refills | Status: DC

## 2021-07-26 NOTE — Progress Notes (Signed)
Virtual Visit via Video Note  I connected with [name] on 07/26/21 at 11:30 AM EDT by telephone--> a video enabled telemedicine application was attempted but technical difficulties from the patient's end made [place] have to abort this.  I verified that I am speaking with the correct person using two identifiers.  Location patient: home, [place] Location provider:work or home office Persons participating in the virtual visit: patient, provider  I discussed the limitations of evaluation and management by telemedicine and the availability of in person appointments. The patient expressed understanding and agreed to proceed.   HPI: 60 y/o WM being seen today for f/u HTN. I last saw him about 6 mo ago. A/P as of that visit: "1) HTN: control much improved. Increase lisinopril to '30mg'$  qd.  Cont felodipine '10mg'$  qd. BMET today.   2) Hx of CVA with residual L wrist and hand contractures (2012). Now that bp near controlled will restart ASA '81mg'$ . Also restart atorvastatin '40mg'$  qd.   3) Left hand contracture deformity+ question of recent increased disability in L hand since fall onto hand 6-7 mo ago. X-ray ordered. I told pt that PT is very unlikely to lead to increased ability to open fingers since CVA was such remote past event. "  INTERIM HX: Pt was admitted 7/25-7/27, 2022 for altered mental status. MRI and CT brain NEG acute but some generalized cerebral atrophy and moderate chronic small vessel dz.  It was felt that he is likely developing some (vascular) dementia. Labs all wnl, including neg urine tox screen and neg alcohol.  Potassium 3.3 on admission, up over 4 on day after.   His mentation cleared over the course of the hospitalization and he was d/c'd home on prior chronic meds and it was recommended he see neurology as outpt. Since d/c home he's been doing pretty good.  No home bp monitoring since last visit except today.  Says he usually can feel when bp is up and he says he has not felt that  way lately.  Says compliant with lisinopril 30 qd and felodipine 10 qd. Taking statin but not taking his ASA.  Says sleep is doing good on seroquel 400 qhs and trazodone '100mg'$  qhs.   CT head w/out contrast 05/21/21: IMPRESSION: 1. No acute intracranial abnormality. 2. Generalized cerebral atrophy with multiple bilateral chronic infarcts.  MRI head w/out contrast 05/21/21. IMPRESSION: 1. No acute intracranial abnormality. 2. Chronic right MCA and left PCA territory infarcts. 3. Underlying age-related cerebral atrophy with moderate chronic small vessel ischemic disease.   ROS as above, plus--> no fevers, no CP, no SOB, no wheezing, no cough, no dizziness, no HAs, no rashes, no melena/hematochezia.  No polyuria or polydipsia.  No myalgias or arthralgias.  No focal weakness, paresthesias, or tremors.  No acute vision or hearing abnormalities.  No dysuria or unusual/new urinary urgency or frequency.  No recent changes in lower legs. No n/v/d or abd pain.  No palpitations.     Past Medical History:  Diagnosis Date   Alcoholism ([place])    Continues to abuse ETOH as of 01/2017; pt has poor insight into his problem.  Pt states that as of 04/2017 he has been 3 monthds sober.   Anxiety and depression    Arthritis    "hands, legs" (12/23/2017)   Chronic joint pain    COPD (chronic obstructive pulmonary disease) ([place])    Depression    Encounter for hemodialysis for acute renal failure ([place]) 04/2020   Poor PO intake/dehydration, with concurrent  use of ACE-I, hctz, and NSAIDs. Emergency HD during hospitalization then d/c'd to SNF w sCr 1.3   Frequent headaches    "depends on BP" (12/23/2017)   History of kidney stones    Hypertension    Insomnia 08/26/2017   08/23/17 ED visit for adverse rxn to taking ambien, PLUS + marijuana on UDS her and in ED---NO FURTHER AMBIEN OR ANY OTHER CONTROLLED SUBSTANCES WILL BE PRESCRIBED BY ME--PM   Pneumothorax, right    Polysubstance abuse ([place])    Poor  historian    UNRELIABLE HISTORIAN   Spastic hemiparesis ([place])    left arm (signif 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  Baclofen oral no help.  Botox inj started by Dr. Posey Pronto 07/2018--no help x 1.  Inj #2 done 10/2018.   Stroke ([place]) 2012   LUE residual deficit: flexion contracture and weakness of L hand.  Old bilateral cerebral infarcts involving the right frontal and parietal lobes as well as the left occipital lobe.  Baclofen and botox via neuro for L arm contracture.   Stroke [facility])    "3 mini strokes since 2012" (12/23/2017)   Subdural hematoma ([place]) 01/29/2017   Subacute right-sided frontal subdural hematoma: found on CT in [place] ED, transferred to [facility] where this was non-operatively managed.  Pt admitted to the MDs at [facility] that he still drinks lots of whisky daily and they surmised that his frequent falls of late are due to this and frequent use of OTC sleep aids.  Near complete resolution on CT 03/27/18.   Venous insufficiency of both lower extremities     Past Surgical History:  Procedure Laterality Date   CHEST TUBE INSERTION     LUNG SURGERY     SHOULDER OPEN ROTATOR CUFF REPAIR Bilateral    Multiple rotator cuff surgeries on both sides.   TONSILLECTOMY     TRANSTHORACIC ECHOCARDIOGRAM  09/24/2019   EF 55-60%, grd I DD.   TYMPANOPLASTY Right    US CAROTIDS  09/24/2019   Minimal atherosclerotic plaque bilat; no flow obstruction   VIDEO ASSISTED THORACOSCOPY (VATS) W/TALC PLEUADESIS  2010 X2     Current Outpatient Medications:    atorvastatin (LIPITOR) 40 MG tablet, TAKE 1 TABLET BY MOUTH EVERY DAY, Disp: 90 tablet, Rfl: 3   felodipine (PLENDIL) 10 MG 24 hr tablet, TAKE 1 TABLET BY MOUTH EVERY DAY, Disp: 30 tablet, Rfl: 0   lisinopril (ZESTRIL) 30 MG tablet, Take 30 mg by mouth daily., Disp: , Rfl:    QUEtiapine (SEROQUEL) 400 MG tablet, Take 1 tablet (400 mg total) by mouth at bedtime., Disp: 90 tablet, Rfl: 1   traZODone (DESYREL) 100 MG tablet, TAKE 1 TABLET BY  MOUTH EVERYDAY AT BEDTIME, Disp: 30 tablet, Rfl: 0   aspirin 81 MG EC tablet, Take 1 tablet (81 mg total) by mouth daily with breakfast. (Patient not taking: Reported on 07/26/2021), Disp: 30 tablet, Rfl: 0   lisinopril (ZESTRIL) 20 MG tablet, Take 20 mg by mouth daily. (Patient not taking: Reported on 07/26/2021), Disp: , Rfl:   EXAM:  VITALS per patient if applicable:  Vitals with BMI 07/26/2021 05/23/2021 05/23/2021  Height - - -  Weight - - -  BMI - - -  Systolic 0000000 [id number]  Diastolic 89 84 82  Pulse - 70 -   Pt alert, sounds well.  Thought and speech lucid. No further exam b/c audio visit only.    LABS: none today.    Chemistry  Component Value Date/Time   NA 137 05/23/2021 0438   NA 134 (A) 01/29/2017 0000   K 4.1 05/23/2021 0438   CL 99 05/23/2021 0438   CO2 29 05/23/2021 0438   BUN 9 05/23/2021 0438   BUN 28 (A) 01/29/2017 0000   CREATININE 0.69 05/23/2021 0438   CREATININE 1.01 04/03/2018 1528      Component Value Date/Time   CALCIUM 9.7 05/23/2021 0438   ALKPHOS 90 05/21/2021 2235   AST 16 05/21/2021 2235   ALT 14 05/21/2021 2235   BILITOT 0.6 05/21/2021 2235     Lab Results  Component Value Date   WBC 9.8 05/23/2021   HGB 15.8 05/23/2021   HCT 47.5 05/23/2021   MCV 102.2 (H) 05/23/2021   PLT 351 05/23/2021   Lab Results  Component Value Date   VITAMINB12 165 (L) 05/09/2020    ASSESSMENT AND PLAN:  Discussed the following assessment and plan:  1) Uncontrolled HTN. Needs to start monitoring bp at home.  Says having battery issues with current cuff. I'll inc lisinopril to 40 qd and continue felodipine 10 qd.  2) Insomnia: stable on seroquel 400 qhs and trazodone 100 qhs.  Plan in person o/v with fasting lipids and bmet 2 mo.  I discussed the assessment and treatment plan with the patient. The patient was provided an opportunity to ask questions and all were answered. The patient agreed with the plan and demonstrated an understanding of the  instructions.   Spent 15 min with pt today reviewing HPI, reviewing relevant past history, doing exam, reviewing and discussing lab and imaging data, and formulating plans.  F/u: 2 mo  Signed:  Crissie Sickles, MD           07/26/2021

## 2021-11-12 ENCOUNTER — Observation Stay
Admission: EM | Admit: 2021-11-12 | Discharge: 2021-11-13 | Disposition: A | Discharge status: Home / Self Care | Payer: Medicare Other | Attending: Internal Medicine | Admitting: Internal Medicine

## 2021-11-12 ENCOUNTER — Other Ambulatory Visit: Payer: Self-pay

## 2021-11-12 ENCOUNTER — Emergency Department: Payer: Medicare Other

## 2021-11-12 DIAGNOSIS — Z79899 Other long term (current) drug therapy: Secondary | ICD-10-CM | POA: Insufficient documentation

## 2021-11-12 DIAGNOSIS — F17210 Nicotine dependence, cigarettes, uncomplicated: Secondary | ICD-10-CM | POA: Insufficient documentation

## 2021-11-12 DIAGNOSIS — Z7982 Long term (current) use of aspirin: Secondary | ICD-10-CM | POA: Insufficient documentation

## 2021-11-12 DIAGNOSIS — R4781 Slurred speech: Secondary | ICD-10-CM

## 2021-11-12 DIAGNOSIS — R200 Anesthesia of skin: Secondary | ICD-10-CM | POA: Diagnosis not present

## 2021-11-12 DIAGNOSIS — Z8673 Personal history of transient ischemic attack (TIA), and cerebral infarction without residual deficits: Secondary | ICD-10-CM | POA: Diagnosis not present

## 2021-11-12 DIAGNOSIS — G459 Transient cerebral ischemic attack, unspecified: Principal | ICD-10-CM | POA: Insufficient documentation

## 2021-11-12 DIAGNOSIS — J449 Chronic obstructive pulmonary disease, unspecified: Secondary | ICD-10-CM | POA: Insufficient documentation

## 2021-11-12 DIAGNOSIS — Z20822 Contact with and (suspected) exposure to covid-19: Secondary | ICD-10-CM | POA: Diagnosis not present

## 2021-11-12 DIAGNOSIS — I10 Essential (primary) hypertension: Secondary | ICD-10-CM | POA: Diagnosis not present

## 2021-11-12 HISTORY — PX: TRANSTHORACIC ECHOCARDIOGRAM: SHX275

## 2021-11-12 HISTORY — DX: Transient cerebral ischemic attack, unspecified: G45.9

## 2021-11-12 LAB — COMPREHENSIVE METABOLIC PANEL
ALT: 15 U/L (ref 0–44)
AST: 18 U/L (ref 15–41)
Albumin: 3.9 g/dL (ref 3.5–5.0)
Alkaline Phosphatase: 96 U/L (ref 38–126)
Anion gap: 6 (ref 5–15)
BUN: 12 mg/dL (ref 8–23)
CO2: 28 mmol/L (ref 22–32)
Calcium: 8.9 mg/dL (ref 8.9–10.3)
Chloride: 105 mmol/L (ref 98–111)
Creatinine, Ser: 0.8 mg/dL (ref 0.61–1.24)
GFR, Estimated: >60 mL/min (ref >60)
Glucose, Bld: 116 mg/dL — ABNORMAL HIGH (ref 70–99)
Potassium: 3.4 mmol/L — ABNORMAL LOW (ref 3.5–5.1)
Sodium: 139 mmol/L (ref 135–145)
Total Bilirubin: 0.4 mg/dL (ref 0.3–1.2)
Total Protein: 6.9 g/dL (ref 6.5–8.1)

## 2021-11-12 LAB — DIFFERENTIAL
Abs Immature Granulocytes: 0.02 10*3/uL (ref 0.00–0.07)
Basophils Absolute: 0.1 10*3/uL (ref 0.0–0.1)
Basophils Relative: 1 %
Eosinophils Absolute: 0.3 10*3/uL (ref 0.0–0.5)
Eosinophils Relative: 6 %
Immature Granulocytes: 0 %
Lymphocytes Relative: 21 %
Lymphs Abs: 1.2 10*3/uL (ref 0.7–4.0)
Monocytes Absolute: 0.4 10*3/uL (ref 0.1–1.0)
Monocytes Relative: 7 %
Neutro Abs: 3.7 10*3/uL (ref 1.7–7.7)
Neutrophils Relative %: 65 %

## 2021-11-12 LAB — CBC
HCT: 41.6 % (ref 39.0–52.0)
Hemoglobin: 14.1 g/dL (ref 13.0–17.0)
MCH: 33.9 pg (ref 26.0–34.0)
MCHC: 33.9 g/dL (ref 30.0–36.0)
MCV: 100 fL (ref 80.0–100.0)
Platelets: 280 10*3/uL (ref 150–400)
RBC: 4.16 MIL/uL — ABNORMAL LOW (ref 4.22–5.81)
RDW: 12.4 % (ref 11.5–15.5)
WBC: 5.8 10*3/uL (ref 4.0–10.5)
nRBC: 0 % (ref 0.0–0.2)

## 2021-11-12 LAB — APTT: aPTT: 31 seconds (ref 24–36)

## 2021-11-12 LAB — PROTIME-INR
INR: 0.9 (ref 0.8–1.2)
Prothrombin Time: 12.2 seconds (ref 11.4–15.2)

## 2021-11-12 LAB — CBG MONITORING, ED: Glucose-Capillary: 183 mg/dL — ABNORMAL HIGH (ref 70–99)

## 2021-11-12 MED ORDER — SODIUM CHLORIDE 0.9% FLUSH
3.0000 mL | Freq: Once | INTRAVENOUS | Status: AC
  Administered 2021-11-13: 3 mL via INTRAVENOUS

## 2021-11-12 NOTE — ED Provider Notes (Signed)
[facility] Provider Note    Event Date/Time   First MD Initiated Contact with Patient 11/12/21 2136     (approximate)   History   Numbness   HPI { BREAKER SPRINGER is a 61 y.o. male who comes in reporting left-sided numbness.  Patient says it started yesterday and then resolved.  He came back again today an hour prior to arrival and then resolved again.  He had some slurred speech and dizziness.  When I saw him this was all completely resolved.  He reports he has left-sided arm and foot deficits from prior strokes.      Physical Exam   Triage Vital Signs: ED Triage Vitals  Enc Vitals Group     BP 11/12/21 1841 (!) 150/95     Pulse Rate 11/12/21 1841 78     Resp 11/12/21 1841 17     Temp 11/12/21 1841 97.8 F (36.6 C)     Temp Source 11/12/21 1841 Oral     SpO2 11/12/21 1841 96 %     Weight --      Height --      Head Circumference --      Peak Flow --      Pain Score 11/12/21 1844 3     Pain Loc --      Pain Edu? --      Excl. in [place]? --     Most recent vital signs: Vitals:   11/12/21 1841  BP: (!) 150/95  Pulse: 78  Resp: 17  Temp: 97.8 F (36.6 C)  SpO2: 96%     General: Awake, no distress.  CV:  Good peripheral perfusion.  Heart regular rate and rhythm no audible murmurs Resp:  Normal effort.  Lungs are clear Abd:  No distention.  Soft and nontender Neuro exam: Neuro exam of the cranial nerves cerebellum sensory and motor shows only the old left-sided deficits from his old stroke.  He confirms are no new deficits present now.     ED Results / Procedures / Treatments   Labs (all labs ordered are listed, but only abnormal results are displayed) Labs Reviewed  CBC - Abnormal; Notable for the following components:      Result Value   RBC 4.16 (*)    All other components within normal limits  COMPREHENSIVE METABOLIC PANEL - Abnormal; Notable for the following components:   Potassium 3.4 (*)    Glucose, Bld 116 (*)    All  other components within normal limits  CBG MONITORING, ED - Abnormal; Notable for the following components:   Glucose-Capillary 183 (*)    All other components within normal limits  PROTIME-INR  APTT  DIFFERENTIAL  CBG MONITORING, ED  TROPONIN I (HIGH SENSITIVITY)     EKG  EKG read interpreted by me shows normal sinus rhythm rate of 75 normal axis irregular baseline makes it somewhat hard to interpret but I do not see anything but nonspecific ST-T wave changes  RADIOLOGY CT of the head read by radiology reviewed by me does not show any new changes per radiology and my and inexpert review.   PROCEDURES:  Critical Care performed: Critical care time 30 minutes.  This includes several minutes trying to persuade the patient not to get up and leave because he initially was put in a bed in the hallway because we had no other beds.  Additionally I reviewed the patient's old records and labs and discussed him briefly with teleneurology although  the official consult had not been done at that point.  Additionally I discussed him in detail with the hospital physician.  Procedures   MEDICATIONS ORDERED IN ED: Medications  sodium chloride flush (NS) 0.9 % injection 3 mL (has no administration in time range)     IMPRESSION / MDM / ASSESSMENT AND PLAN / ED COURSE  I reviewed the triage vital signs and the nursing notes.                              Differential diagnosis includes, but is not limited to, malingering, anxiety, TIA, Todd's paralysis although most of these are very unlikely. he patient is on the cardiac monitor to evaluate for evidence of arrhythmia and/or significant heart rate changes. I did review the CT scan and EKG myself as well as the lab work.  I have reviewed some but not all of the old records.  We did not give him any medicines here.  I did discuss him briefly with the telemetry neurologist although not as an official consult.  I also discussed him in detail with the  hospital physician.  The patient will be admitted for TIA.  He has had previous strokes which is the largest risk factor for further stroke.  Any further interventions will depend on neurology and further work-up.       FINAL CLINICAL IMPRESSION(S) / ED DIAGNOSES   Final diagnoses:  TIA (transient ischemic attack)     Rx / DC Orders   ED Discharge Orders     None        Note:  This document was prepared using Dragon voice recognition software and may include unintentional dictation errors.   Nena Polio, MD 11/13/21 0110

## 2021-11-12 NOTE — ED Triage Notes (Addendum)
Pt comes with c/o left sided numbness. Pt states this started yesterday and then resolved. Pt states hour ago it started again and now has resolved again.  Pt states slurred speech and dizziness. Pt this all started yesterday as well. Pt does have some slurred speech when answering questions.  Pt states left sided arm and foot deficit from prior strokes.

## 2021-11-13 ENCOUNTER — Observation Stay
Admit: 2021-11-13 | Discharge: 2021-11-13 | Disposition: A | Discharge status: Home / Self Care | Payer: Medicare Other | Attending: Family Medicine | Admitting: Family Medicine

## 2021-11-13 ENCOUNTER — Observation Stay: Payer: Medicare Other

## 2021-11-13 DIAGNOSIS — G459 Transient cerebral ischemic attack, unspecified: Secondary | ICD-10-CM | POA: Diagnosis not present

## 2021-11-13 DIAGNOSIS — H539 Unspecified visual disturbance: Secondary | ICD-10-CM | POA: Diagnosis not present

## 2021-11-13 DIAGNOSIS — R29818 Other symptoms and signs involving the nervous system: Secondary | ICD-10-CM | POA: Diagnosis not present

## 2021-11-13 DIAGNOSIS — G9389 Other specified disorders of brain: Secondary | ICD-10-CM | POA: Diagnosis not present

## 2021-11-13 DIAGNOSIS — R55 Syncope and collapse: Secondary | ICD-10-CM | POA: Diagnosis not present

## 2021-11-13 DIAGNOSIS — R200 Anesthesia of skin: Secondary | ICD-10-CM | POA: Diagnosis not present

## 2021-11-13 DIAGNOSIS — I10 Essential (primary) hypertension: Secondary | ICD-10-CM | POA: Diagnosis not present

## 2021-11-13 DIAGNOSIS — E785 Hyperlipidemia, unspecified: Secondary | ICD-10-CM | POA: Diagnosis not present

## 2021-11-13 LAB — CBC
HCT: 39.5 % (ref 39.0–52.0)
Hemoglobin: 13.7 g/dL (ref 13.0–17.0)
MCH: 34.2 pg — ABNORMAL HIGH (ref 26.0–34.0)
MCHC: 34.7 g/dL (ref 30.0–36.0)
MCV: 98.5 fL (ref 80.0–100.0)
Platelets: 275 10*3/uL (ref 150–400)
RBC: 4.01 MIL/uL — ABNORMAL LOW (ref 4.22–5.81)
RDW: 12.3 % (ref 11.5–15.5)
WBC: 8.7 10*3/uL (ref 4.0–10.5)
nRBC: 0 % (ref 0.0–0.2)

## 2021-11-13 LAB — LIPID PANEL
Cholesterol: 133 mg/dL (ref 0–200)
HDL: 60 mg/dL (ref >40)
LDL Cholesterol: 63 mg/dL (ref 0–99)
Total CHOL/HDL Ratio: 2.2 RATIO
Triglycerides: 51 mg/dL (ref <150)
VLDL: 10 mg/dL (ref 0–40)

## 2021-11-13 LAB — RESP PANEL BY RT-PCR (FLU A&B, COVID) ARPGX2
Influenza A by PCR: NEGATIVE
Influenza B by PCR: NEGATIVE
SARS Coronavirus 2 by RT PCR: NEGATIVE

## 2021-11-13 LAB — URINE DRUG SCREEN, QUALITATIVE (ARMC ONLY)
Amphetamines, Ur Screen: NOT DETECTED
Barbiturates, Ur Screen: NOT DETECTED
Benzodiazepine, Ur Scrn: NOT DETECTED
Cannabinoid 50 Ng, Ur ~~LOC~~: NOT DETECTED
Cocaine Metabolite,Ur ~~LOC~~: NOT DETECTED
MDMA (Ecstasy)Ur Screen: NOT DETECTED
Methadone Scn, Ur: NOT DETECTED
Opiate, Ur Screen: NOT DETECTED
Phencyclidine (PCP) Ur S: NOT DETECTED
Tricyclic, Ur Screen: POSITIVE — AB

## 2021-11-13 LAB — BASIC METABOLIC PANEL
Anion gap: 6 (ref 5–15)
BUN: 12 mg/dL (ref 8–23)
CO2: 27 mmol/L (ref 22–32)
Calcium: 8.7 mg/dL — ABNORMAL LOW (ref 8.9–10.3)
Chloride: 105 mmol/L (ref 98–111)
Creatinine, Ser: 0.87 mg/dL (ref 0.61–1.24)
GFR, Estimated: >60 mL/min (ref >60)
Glucose, Bld: 123 mg/dL — ABNORMAL HIGH (ref 70–99)
Potassium: 4 mmol/L (ref 3.5–5.1)
Sodium: 138 mmol/L (ref 135–145)

## 2021-11-13 LAB — MAGNESIUM: Magnesium: 2 mg/dL (ref 1.7–2.4)

## 2021-11-13 LAB — HEMOGLOBIN A1C
Hgb A1c MFr Bld: 5.4 % (ref 4.8–5.6)
Mean Plasma Glucose: 108.28 mg/dL

## 2021-11-13 LAB — TROPONIN I (HIGH SENSITIVITY)
Troponin I (High Sensitivity): 7 ng/L (ref <18)
Troponin I (High Sensitivity): 11 ng/L (ref <18)

## 2021-11-13 LAB — MRSA NEXT GEN BY PCR, NASAL: MRSA by PCR Next Gen: NOT DETECTED

## 2021-11-13 MED ORDER — ONDANSETRON HCL 4 MG PO TABS: 4.0000 mg | ORAL_TABLET | Freq: Four times a day (QID) | ORAL | Status: DC | PRN

## 2021-11-13 MED ORDER — CLOPIDOGREL BISULFATE 75 MG PO TABS
75.0000 mg | ORAL_TABLET | Freq: Every day | ORAL | Status: DC
  Administered 2021-11-13: 08:00:00 75 mg via ORAL
  Filled 2021-11-13: qty 1

## 2021-11-13 MED ORDER — MAGNESIUM HYDROXIDE 400 MG/5ML PO SUSP
30.0000 mL | Freq: Every day | ORAL | Status: DC | PRN
  Filled 2021-11-13: qty 30

## 2021-11-13 MED ORDER — LISINOPRIL 20 MG PO TABS
40.0000 mg | ORAL_TABLET | Freq: Every day | ORAL | Status: DC
  Administered 2021-11-13: 40 mg via ORAL
  Filled 2021-11-13: qty 2

## 2021-11-13 MED ORDER — GADOBUTROL 1 MMOL/ML IV SOLN
7.5000 mL | Freq: Once | INTRAVENOUS | Status: AC | PRN
  Administered 2021-11-13: 07:00:00 7.5 mL via INTRAVENOUS

## 2021-11-13 MED ORDER — ASPIRIN EC 81 MG PO TBEC
81.0000 mg | DELAYED_RELEASE_TABLET | Freq: Every day | ORAL | Status: DC
  Administered 2021-11-13: 08:00:00 81 mg via ORAL
  Filled 2021-11-13: qty 1

## 2021-11-13 MED ORDER — TRAZODONE HCL 50 MG PO TABS
100.0000 mg | ORAL_TABLET | Freq: Every day | ORAL | Status: DC
  Administered 2021-11-13: 100 mg via ORAL
  Filled 2021-11-13: qty 1

## 2021-11-13 MED ORDER — ACETAMINOPHEN 650 MG RE SUPP: 650.0000 mg | Freq: Four times a day (QID) | RECTAL | Status: DC | PRN

## 2021-11-13 MED ORDER — ACETAMINOPHEN 325 MG PO TABS
650.0000 mg | ORAL_TABLET | Freq: Four times a day (QID) | ORAL | Status: DC | PRN
  Administered 2021-11-13: 14:00:00 650 mg via ORAL
  Filled 2021-11-13: qty 2

## 2021-11-13 MED ORDER — ENOXAPARIN SODIUM 40 MG/0.4ML IJ SOSY: 40.0000 mg | PREFILLED_SYRINGE | INTRAMUSCULAR | Status: DC

## 2021-11-13 MED ORDER — STROKE: EARLY STAGES OF RECOVERY BOOK: Freq: Once | Status: DC

## 2021-11-13 MED ORDER — QUETIAPINE FUMARATE 200 MG PO TABS
400.0000 mg | ORAL_TABLET | Freq: Every day | ORAL | Status: DC
  Administered 2021-11-13: 400 mg via ORAL
  Filled 2021-11-13 (×2): qty 2

## 2021-11-13 MED ORDER — ASPIRIN 81 MG PO TBEC
81.0000 mg | DELAYED_RELEASE_TABLET | Freq: Every day | ORAL | 11 refills | Status: AC
Start: 2021-11-14 — End: ?

## 2021-11-13 MED ORDER — ATORVASTATIN CALCIUM 20 MG PO TABS
40.0000 mg | ORAL_TABLET | Freq: Every day | ORAL | Status: DC
  Administered 2021-11-13: 08:00:00 40 mg via ORAL
  Filled 2021-11-13: qty 2

## 2021-11-13 MED ORDER — FELODIPINE ER 10 MG PO TB24
10.0000 mg | ORAL_TABLET | Freq: Every day | ORAL | Status: DC
  Administered 2021-11-13: 08:00:00 10 mg via ORAL
  Filled 2021-11-13: qty 1

## 2021-11-13 MED ORDER — ENOXAPARIN SODIUM 60 MG/0.6ML IJ SOSY
0.5000 mg/kg | PREFILLED_SYRINGE | INTRAMUSCULAR | Status: DC
  Filled 2021-11-13: qty 0.6

## 2021-11-13 MED ORDER — POTASSIUM CHLORIDE 20 MEQ PO PACK
40.0000 meq | PACK | Freq: Once | ORAL | Status: AC
Start: 2021-11-13 — End: 2021-11-13
  Administered 2021-11-13: 05:00:00 40 meq via ORAL
  Filled 2021-11-13: qty 2

## 2021-11-13 MED ORDER — SODIUM CHLORIDE 0.9 % IV SOLN: INTRAVENOUS | Status: DC

## 2021-11-13 MED ORDER — ONDANSETRON HCL 4 MG/2ML IJ SOLN: 4.0000 mg | Freq: Four times a day (QID) | INTRAMUSCULAR | Status: DC | PRN

## 2021-11-13 MED ORDER — TRAZODONE HCL 50 MG PO TABS: 25.0000 mg | ORAL_TABLET | Freq: Every evening | ORAL | Status: DC | PRN

## 2021-11-13 MED ORDER — CALCIUM CARBONATE ANTACID 500 MG PO CHEW
200.0000 mg | CHEWABLE_TABLET | Freq: Once | ORAL | Status: AC
  Administered 2021-11-13: 200 mg via ORAL
  Filled 2021-11-13: qty 1

## 2021-11-13 NOTE — Care Management (Signed)
°  Transition of Care [facility]) Screening Note   Patient Details  Name: [name] Date of Birth: 1961-08-15   Transition of Care [facility]) CM/SW Contact:    Pete Pelt, RN Phone Number: 11/13/2021, 3:39 PM    Transition of Care Department [facility]) has reviewed patient and no TOC needs have been identified at this time. We will continue to monitor patient advancement through interdisciplinary progression rounds. If new patient transition needs arise, please place a TOC consult.    Addendum:  states son will assist if he needs anything and he is not current with PCP and states he will follow up after hospitalization  Declines other TOC

## 2021-11-13 NOTE — Progress Notes (Signed)
*  PRELIMINARY RESULTS* Echocardiogram 2D Echocardiogram has been performed.  [name] 11/13/2021, 1:54 PM

## 2021-11-13 NOTE — Progress Notes (Signed)
Anticoagulation monitoring(Lovenox):  61 yo male ordered Lovenox 40 mg Q24h    Filed Weights   11/13/21 0319  Weight: 88.3 kg (194 lb 10.7 oz)   BMI 30.49   Lab Results  Component Value Date   CREATININE 0.80 11/12/2021   CREATININE 0.69 05/23/2021   CREATININE 0.94 05/21/2021   Estimated Creatinine Clearance: 101.6 mL/min (by C-G formula based on SCr of 0.8 mg/dL). Hemoglobin & Hematocrit     Component Value Date/Time   HGB 14.1 11/12/2021 1846   HCT 41.6 11/12/2021 1846     Per Protocol for Patient with estCrcl > 30 ml/min and BMI > 30, will transition to Lovenox 45 mg Q24h.

## 2021-11-13 NOTE — Progress Notes (Signed)
PT Cancellation Note  Patient Details Name: [name] MRN: [id number] DOB: [birth year]   Cancelled Treatment:    Reason Eval/Treat Not Completed: Other (comment). Consult received, chart reviewed. Pt wakes to voice, declines PT eval at this time due to wanting breakfast and brief nap. PT to re-attempt as able.   Jonnie Kind, SPT 11/13/2021, 8:45 AM

## 2021-11-13 NOTE — Progress Notes (Signed)
SLP Cancellation Note  Patient Details Name: [name] MRN: [id number] DOB: 02-15-1961   Cancelled treatment:       Reason Eval/Treat Not Completed: SLP screened, no needs identified, will sign off (chart reviewed; consulted NSG then met w/ pt) Pt denied any difficulty swallowing and is currently on a regular diet; tolerates swallowing pills w/ water per NSG. Pt conversed in conversation w/out overt, gross expressive/receptive deficits noted; pt denied any speech-language deficits. Speech intelligible. Assisted pt in ordering his lunch meal that he picked from the menu. No further skilled ST services indicated as pt appears at her baseline. Pt agreed. NSG to reconsult if any change in status while admitted.      Orinda Kenner, MS, CCC-SLP Speech Language Pathologist Rehab Services; [place] [phone] (ascom) Tamber Burtch 11/13/2021, 12:15 PM

## 2021-11-13 NOTE — Progress Notes (Signed)
OT Cancellation Note  Patient Details Name: COHAN STIPES MRN: [id number] DOB: [birth year]   Cancelled Treatment:    Reason Eval/Treat Not Completed: Other (comment);Fatigue/lethargy limiting ability to participate. OT order received. Chart reviewed. Per physical therapy, pt declines therapy evaluation this am 2/2 fatigue. Pt also noted to be pending MRI. Will hold at this time and re-attempt at a later date/time as available and pt medically appropriate for OT services.   Shara Blazing, M.S., OTR/L Feeding Team - [place] [facility] Ascom: [phone] 11/13/21, 9:34 AM

## 2021-11-13 NOTE — H&P (Addendum)
[place]   PATIENT NAME: [name]    MR#:  [id number]  DATE OF BIRTH:  19-Mar-1961  DATE OF ADMISSION:  11/12/2021  PRIMARY CARE PHYSICIAN: Tammi Sou, MD   Patient is coming from: Home  REQUESTING/REFERRING PHYSICIAN: Conni Slipper, MD  CHIEF COMPLAINT:   Chief Complaint  Patient presents with   Numbness    HISTORY OF PRESENT ILLNESS:  [name] is a 61 y.o. Caucasian male with medical history significant for alcoholism, anxiety and depression, COPD, hypertension and polysubstance abuse as well as CVA with left-sided hemiparesis and left hand contracture, who presented to the emergency room with acute onset of left upper and lower extremities numbness and stiffness with associated worsening weakness and slurred speech with associated dizziness and vertigo that started the day before his presentation to the ER.  He denies any headache, blurred vision or diplopia.  No tinnitus.  No chest pain or palpitations.  No cough or wheezing or dyspnea.  No bleeding diathesis.  No urinary or stool incontinence.  No witnessed seizures.  ED Course: Upon presenting to the emergency room, Blood pressure was 150/95 with otherwise normal vital signs.  Labs revealed mild hypokalemia of 3.4 with otherwise unremarkable CMP and CBC.  Respiratory panel is currently pending.  EKG as reviewed by me :  EKG showed sinus rhythm with a rate of 75 with prolonged QT interval with QTC of 462 MS as well as ST  changesand T wave inversion inferolaterally. Imaging: Noncontrasted head CT scan showed chronic, 5 MCA and left PCA infarcts with generalized atrophic changes with no acute abnormality noted.  The patient will be admitted to a medical telemetry observation bed for further evaluation and management. PAST MEDICAL HISTORY:   Past Medical History:  Diagnosis Date   Alcoholism ([place])    Continues to abuse ETOH as of 01/2017; pt has poor insight into his problem.  Pt states that as of 04/2017 he  has been 3 monthds sober.   Anxiety and depression    Arthritis    "hands, legs" (12/23/2017)   Chronic joint pain    COPD (chronic obstructive pulmonary disease) ([place])    Depression    Encounter for hemodialysis for acute renal failure ([place]) 04/2020   Poor PO intake/dehydration, with concurrent use of ACE-I, hctz, and NSAIDs. Emergency HD during hospitalization then d/c'd to SNF w sCr 1.3   Frequent headaches    "depends on BP" (12/23/2017)   History of kidney stones    Hypertension    Insomnia 08/26/2017   08/23/17 ED visit for adverse rxn to taking ambien, PLUS + marijuana on UDS her and in ED---NO FURTHER AMBIEN OR ANY OTHER CONTROLLED SUBSTANCES WILL BE PRESCRIBED BY ME--PM   Pneumothorax, right    Polysubstance abuse ([place])    Poor historian    UNRELIABLE HISTORIAN   Spastic hemiparesis ([place])    left arm (signif 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  Baclofen oral no help.  Botox inj started by Dr. Posey Pronto 07/2018--no help x 1.  Inj #2 done 10/2018.   Stroke ([place]) 2012   LUE residual deficit: flexion contracture and weakness of L hand.  Old bilateral cerebral infarcts involving the right frontal and parietal lobes as well as the left occipital lobe.  Baclofen and botox via neuro for L arm contracture.   Stroke [facility])    "3 mini strokes since 2012" (12/23/2017)   Subdural hematoma 01/29/2017   Subacute  right-sided frontal subdural hematoma: found on CT in [place] ED, transferred to [facility] where this was non-operatively managed.  Pt admitted to the MDs at [facility] that he still drinks lots of whisky daily and they surmised that his frequent falls of late are due to this and frequent use of OTC sleep aids.  Near complete resolution on CT 03/27/18.   Venous insufficiency of both lower extremities     PAST SURGICAL HISTORY:   Past Surgical History:  Procedure Laterality Date   CHEST TUBE INSERTION     LUNG SURGERY     SHOULDER OPEN ROTATOR CUFF REPAIR Bilateral    Multiple rotator  cuff surgeries on both sides.   TONSILLECTOMY     TRANSTHORACIC ECHOCARDIOGRAM  09/24/2019   EF 55-60%, grd I DD.   TYMPANOPLASTY Right    US CAROTIDS  09/24/2019   Minimal atherosclerotic plaque bilat; no flow obstruction   VIDEO ASSISTED THORACOSCOPY (VATS) W/TALC PLEUADESIS  2010 X2    SOCIAL HISTORY:   Social History   Tobacco Use   Smoking status: Every Day    Packs/day: 1.00    Years: 44.00    Pack years: 44.00    Types: Cigarettes   Smokeless tobacco: Never  Substance Use Topics   Alcohol use: No    Comment: Quit summer 2018. (12/23/2017)    FAMILY HISTORY:   Family History  Problem Relation Age of Onset   Arthritis Mother    Heart disease Father    Hypertension Father    Breast cancer Maternal Aunt    Alcohol abuse Maternal Uncle    Lung cancer Maternal Aunt    Cancer Brother        bone    DRUG ALLERGIES:   Allergies  Allergen Reactions   Ambien [Zolpidem] Other (See Comments)    Pt took too much, acted out a bad dream/was throwing things, etc--ED visit (07/2017)    REVIEW OF SYSTEMS:   ROS As per history of present illness. All pertinent systems were reviewed above. Constitutional, HEENT, cardiovascular, respiratory, GI, GU, musculoskeletal, neuro, psychiatric, endocrine, integumentary and hematologic systems were reviewed and are otherwise negative/unremarkable except for positive findings mentioned above in the HPI.   MEDICATIONS AT HOME:   Prior to Admission medications   Medication Sig Start Date End Date Taking? Authorizing Provider  aspirin 81 MG EC tablet Take 1 tablet (81 mg total) by mouth daily with breakfast. Patient not taking: Reported on 07/26/2021 05/12/20   Roxan Hockey, MD  atorvastatin (LIPITOR) 40 MG tablet TAKE 1 TABLET BY MOUTH EVERY DAY 02/02/21   McGowen, Adrian Blackwater, MD  felodipine (PLENDIL) 10 MG 24 hr tablet TAKE 1 TABLET BY MOUTH EVERY DAY 07/26/21   McGowen, Adrian Blackwater, MD  lisinopril (ZESTRIL) 40 MG tablet Take 1 tablet (40  mg total) by mouth daily. 07/26/21 07/26/22  McGowen, Adrian Blackwater, MD  QUEtiapine (SEROQUEL) 400 MG tablet Take 1 tablet (400 mg total) by mouth at bedtime. 07/26/21   McGowen, Adrian Blackwater, MD  traZODone (DESYREL) 100 MG tablet TAKE 1 TABLET BY MOUTH EVERYDAY AT BEDTIME 07/26/21   McGowen, Adrian Blackwater, MD      VITAL SIGNS:  Blood pressure (!) 177/110, pulse 71, temperature 97.8 F (36.6 C), temperature source Oral, resp. rate 19, SpO2 97 %.  PHYSICAL EXAMINATION:  Physical Exam  GENERAL:  61 y.o.-year-old Caucasian male patient lying in the bed with no acute distress.  EYES: Pupils equal, round, reactive to light and accommodation. No scleral icterus.  Extraocular muscles intact.  HEENT: Head atraumatic, normocephalic. Oropharynx and nasopharynx clear.  NECK:  Supple, no jugular venous distention. No thyroid enlargement, no tenderness.  LUNGS: Normal breath sounds bilaterally, no wheezing, rales,rhonchi or crepitation. No use of accessory muscles of respiration.  CARDIOVASCULAR: Regular rate and rhythm, S1, S2 normal. No murmurs, rubs, or gallops.  ABDOMEN: Soft, nondistended, nontender. Bowel sounds present. No organomegaly or mass.  EXTREMITIES: No pedal edema, cyanosis, or clubbing.  NEUROLOGIC: Cranial nerves II through XII are intact.  He has Repairs with most strength of 1-2/5 in the left upper extremity with left hand contracture and muscle strength of 3/5 in left lower extremity compared to 4-5/5 in the right upper and lower extremity.  The patient's speech was better and close to his baseline. PSYCHIATRIC: The patient is alert and oriented x 3.  Normal affect and good eye contact. SKIN: No obvious rash, lesion, or ulcer.   LABORATORY PANEL:   CBC Recent Labs  Lab 11/12/21 1846  WBC 5.8  HGB 14.1  HCT 41.6  PLT 280   ------------------------------------------------------------------------------------------------------------------  Chemistries  Recent Labs  Lab 11/12/21 1846  NA  139  K 3.4*  CL 105  CO2 28  GLUCOSE 116*  BUN 12  CREATININE 0.80  CALCIUM 8.9  AST 18  ALT 15  ALKPHOS 96  BILITOT 0.4   ------------------------------------------------------------------------------------------------------------------  Cardiac Enzymes No results for input(s): TROPONINI in the last 168 hours. ------------------------------------------------------------------------------------------------------------------  RADIOLOGY:  CT HEAD WO CONTRAST  Result Date: 11/12/2021 CLINICAL DATA:  Left-sided numbness, initial encounter EXAM: CT HEAD WITHOUT CONTRAST TECHNIQUE: Contiguous axial images were obtained from the base of the skull through the vertex without intravenous contrast. RADIATION DOSE REDUCTION: This exam was performed according to the departmental dose-optimization program which includes automated exposure control, adjustment of the mA and/or kV according to patient size and/or use of iterative reconstruction technique. COMPARISON:  05/21/2021 FINDINGS: Brain: Chronic atrophic changes are again identified. Changes consistent with prior right MCA and left PCA infarcts are again seen and stable from prior exam. No findings to suggest acute hemorrhage, acute infarction or space-occupying mass lesion are noted. Vascular: No hyperdense vessel or unexpected calcification. Skull: Normal. Negative for fracture or focal lesion. Sinuses/Orbits: Orbits are within normal limits. Mild mucosal thickening is noted within the left maxillary antrum. Other: None. IMPRESSION: Chronic right MCA and left PCA infarcts are noted. Generalized atrophic changes are again seen. No acute abnormality noted. Electronically Signed   By: Inez Catalina M.D.   On: 11/12/2021 19:04      IMPRESSION AND PLAN:  Principal Problem:   TIA (transient ischemic attack)  1.  Left upper and lower extremity numbness and worsening weakness with slurred speech and dizziness.  Differential diagnoses are including TIA  and evolving CVA. - The patient will be admitted to an observation medical telemetry bed. - We will follow neurochecks every 4 hours for 24 hours. - We will obtain a brain MRI without contrast as well as bilateral carotid Doppler. - We will add Plavix to his aspirin. - Statin therapy will be provided and fasting lipids checked. - Neurology consult to be obtained. - I notified Dr. Quinn Axe about the patient. - PT/OT and ST consults will be obtained.  2. Essential hypertension. -We will continue Plendil and Zestril with permissive parameters.  3.  Dyslipidemia. - We will continue statin therapy.  4.  Hypokalemia. - Potassium will be replaced and magnesium level will be checked.  5.  Depression. - We will  continue Seroquel.  DVT prophylaxis: Lovenox. Code Status: full code. Family Communication:  The plan of care was discussed in details with the patient (and family). I answered all questions. The patient agreed to proceed with the above mentioned plan. Further management will depend upon hospital course. Disposition Plan: Back to previous home environment Consults called: Neurology. All the records are reviewed and case discussed with ED provider.  Status is: Observation  At the time of the admission, it appears that the appropriate admission status for this patient is inpatient.  This is judged to be reasonable and necessary in order to provide the required intensity of service to ensure the patient's safety given the presenting symptoms, physical exam findings and initial radiographic and laboratory data in the context of comorbid conditions.  The patient requires inpatient status due to high intensity of service, high risk of further deterioration and high frequency of surveillance required.  I certify that at the time of admission, it is my clinical judgment that the patient will require inpatient hospital care extending more than 2 midnights.                            Dispo: The  patient is from: Home              Anticipated d/c is to: Home              Patient currently is not medically stable to d/c.              Difficult to place patient: No   Christel Mormon M.D on 11/13/2021 at 12:43 AM  [facility]   From 7 PM-7 AM, contact night-coverage [email or web address]  CC: Primary care physician; Tammi Sou, MD

## 2021-11-13 NOTE — Progress Notes (Signed)
OT Screen Note  Patient Details Name: [name] MRN: [id number] DOB: 26-Feb-1961   Cancelled Treatment:    Reason Eval/Treat Not Completed: OT screened, no needs identified, will sign off. OT order received. Chart reviewed. Pt continues to decline therapy services. Seen in room this date. He denies ongoing strength or sensory deficit outside of baseline functional impairment. He endorses feeling back to his regular level of strength and independence during ADL management in room. Per care team report, pt is up independently and does not require assistance for self-care tasks. Pt agreeable to sitting EOB with this author. He quickly comes to EOB with good eccentric control. No need for physical assist for bed mobility this date. He is able to demonstrate self feeding task independently using his RUE. He denies any further therapy services for maximizing LUE functional use. He states "I tried all that before and my arm still looks like this". No acute needs identified. OT will sign off at this time. Please re-consult if additional needs arise during this admission.   Shara Blazing, M.S., OTR/L Feeding Team - [place] [facility] Ascom: [phone] 11/13/21, 2:51 PM

## 2021-11-13 NOTE — Progress Notes (Signed)
PT Cancellation Note  Patient Details Name: [name] MRN: [id number] DOB: 17-Dec-1960   Cancelled Treatment:    Reason Eval/Treat Not Completed: Other (comment). Pt refusing PT throughout today. Declining any mobility attempts with PT. Pt stated he is back at his baseline and is not interested in anything to do with PT today. MD and RN notified of pt status, no acute needs, PT to sign off. Please re-consult if acute needs arise.    [name] PT, DPT 4:07 PM,11/13/21

## 2021-11-13 NOTE — ED Notes (Signed)
Spoke to lab and per lab could add on Troponin to previous light green top sent earlier.

## 2021-11-13 NOTE — Discharge Summary (Signed)
[facility] NAME: [name]    MR#:  [id number]  DATE OF BIRTH:  [birth year]  DATE OF ADMISSION:  11/12/2021 ADMITTING PHYSICIAN: Christel Mormon, MD  DATE OF DISCHARGE: 11/13/2021  PRIMARY CARE PHYSICIAN: Tammi Sou, MD    ADMISSION DIAGNOSIS:  TIA (transient ischemic attack) [G45.9]  DISCHARGE DIAGNOSIS:  TIA  SECONDARY DIAGNOSIS:   Past Medical History:  Diagnosis Date   Alcoholism ([place])    Continues to abuse ETOH as of 01/2017; pt has poor insight into his problem.  Pt states that as of 04/2017 he has been 3 monthds sober.   Anxiety and depression    Arthritis    "hands, legs" (12/23/2017)   Chronic joint pain    COPD (chronic obstructive pulmonary disease) ([place])    Depression    Encounter for hemodialysis for acute renal failure ([place]) 04/2020   Poor PO intake/dehydration, with concurrent use of ACE-I, hctz, and NSAIDs. Emergency HD during hospitalization then d/c'd to SNF w sCr 1.3   Frequent headaches    "depends on BP" (12/23/2017)   History of kidney stones    Hypertension    Insomnia 08/26/2017   08/23/17 ED visit for adverse rxn to taking ambien, PLUS + marijuana on UDS her and in ED---NO FURTHER AMBIEN OR ANY OTHER CONTROLLED SUBSTANCES WILL BE PRESCRIBED BY ME--PM   Pneumothorax, right    Polysubstance abuse ([place])    Poor historian    UNRELIABLE HISTORIAN   Spastic hemiparesis ([place])    left arm (signif 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  Baclofen oral no help.  Botox inj started by Dr. Posey Pronto 07/2018--no help x 1.  Inj #2 done 10/2018.   Stroke ([place]) 2012   LUE residual deficit: flexion contracture and weakness of L hand.  Old bilateral cerebral infarcts involving the right frontal and parietal lobes as well as the left occipital lobe.  Baclofen and botox via neuro for L arm contracture.   Stroke [facility])    "3 mini strokes since 2012" (12/23/2017)   Subdural hematoma 01/29/2017   Subacute  right-sided frontal subdural hematoma: found on CT in [facility] ED, transferred to [facility] where this was non-operatively managed.  Pt admitted to the MDs at [facility] that he still drinks lots of whisky daily and they surmised that his frequent falls of late are due to this and frequent use of OTC sleep aids.  Near complete resolution on CT 03/27/18.   Venous insufficiency of both lower extremities     HOSPITAL COURSE:  [name] is a 61 y.o. Caucasian male with medical history significant for alcoholism, anxiety and depression, COPD, hypertension and polysubstance abuse as well as CVA with left-sided hemiparesis and left hand contracture, who presented to the emergency room with acute onset of left upper and lower extremities numbness and stiffness with associated worsening weakness and slurred speech with associated dizziness and vertigo that started the day before his presentation to the ER  acute on chronic left upper and lower extremity weakness with worsening slurred speech history of CVA with left sided weakness chronic -- CT head no acute CVA -- MRI of the brain No acute intracranial abnormality. Advanced chronic ischemic disease, stable from last year. -- Carotid Doppler showed minimal amount of bilateral atherosclerotic plaque, right subjectively greater than left, morphologically similar to the 08/2019 examination, and again not resulting in a hemodynamically significant stenosis within either internal carotid artery. --  Patient worked with occupational therapy -- patient refused to work with PT. She is he said he ambulated on his own without difficulty -- patient tells me his back to his baseline and wants to go home-- he is recommended take aspirin 81 mg daily. -- Continue statins  Hypertension -- lisinopril and felodipine   depression continue Seroquel  patient overall is at near baseline. Will discharged to home with outpatient follow-up PCP. Patient did not want to work with physical  therapy. He did do some occupational therapy.  CONSULTS OBTAINED:  Treatment Team:  Derek Jack, MD  DRUG ALLERGIES:   Allergies  Allergen Reactions   Ambien [Zolpidem] Other (See Comments)    Pt took too much, acted out a bad dream/was throwing things, etc--ED visit (07/2017)    DISCHARGE MEDICATIONS:   Allergies as of 11/13/2021       Reactions   Ambien [zolpidem] Other (See Comments)   Pt took too much, acted out a bad dream/was throwing things, etc--ED visit (07/2017)        Medication List     TAKE these medications    aspirin 81 MG EC tablet Take 1 tablet (81 mg total) by mouth daily with breakfast. Swallow whole. Start taking on: November 14, 2021 What changed: additional instructions   atorvastatin 40 MG tablet Commonly known as: LIPITOR TAKE 1 TABLET BY MOUTH EVERY DAY What changed:  how much to take how to take this when to take this   felodipine 10 MG 24 hr tablet Commonly known as: PLENDIL TAKE 1 TABLET BY MOUTH EVERY DAY What changed:  how much to take how to take this when to take this   lisinopril 40 MG tablet Commonly known as: ZESTRIL Take 1 tablet (40 mg total) by mouth daily.   QUEtiapine 400 MG tablet Commonly known as: SEROQUEL Take 1 tablet (400 mg total) by mouth at bedtime.   traZODone 100 MG tablet Commonly known as: DESYREL TAKE 1 TABLET BY MOUTH EVERYDAY AT BEDTIME What changed:  how much to take how to take this when to take this        If you experience worsening of your admission symptoms, develop shortness of breath, life threatening emergency, suicidal or homicidal thoughts you must seek medical attention immediately by calling 911 or calling your MD immediately  if symptoms less severe.  You Must read complete instructions/literature along with all the possible adverse reactions/side effects for all the Medicines you take and that have been prescribed to you. Take any new Medicines after you have completely  understood and accept all the possible adverse reactions/side effects.   Please note  You were cared for by a hospitalist during your hospital stay. If you have any questions about your discharge medications or the care you received while you were in the hospital after you are discharged, you can call the unit and asked to speak with the hospitalist on call if the hospitalist that took care of you is not available. Once you are discharged, your primary care physician will handle any further medical issues. Please note that NO REFILLS for any discharge medications will be authorized once you are discharged, as it is imperative that you return to your primary care physician (or establish a relationship with a primary care physician if you do not have one) for your aftercare needs so that they can reassess your need for medications and monitor your lab values. Today   SUBJECTIVE   mild headache. No new  complaints.  VITAL SIGNS:  Blood pressure 135/87, pulse 72, temperature 98.5 F (36.9 C), temperature source Oral, resp. rate 18, height 5\' 7"  (1.702 m), weight 88.3 kg, SpO2 96 %.  I/O:   Intake/Output Summary (Last 24 hours) at 11/13/2021 1447 Last data filed at 11/13/2021 0900 Gross per 24 hour  Intake 546.67 ml  Output 250 ml  Net 296.67 ml    PHYSICAL EXAMINATION:  GENERAL:  61 y.o.-year-old patient lying in the bed with no acute distress.  LUNGS: Normal breath sounds bilaterally, no wheezing, rales,rhonchi or crepitation.  CARDIOVASCULAR: S1, S2 normal. No murmurs, rubs, or gallops.  ABDOMEN: Soft, non-tender, non-distended. Bowel sounds present.  EXTREMITIES: No  clubbing.  NEUROLOGIC: chronic left upper and lower extremity weakness 4/5 strength PSYCHIATRIC:  patient is alert and awake  SKIN: No obvious rash, lesion, or ulcer.   DATA REVIEW:   CBC  Recent Labs  Lab 11/13/21 0406  WBC 8.7  HGB 13.7  HCT 39.5  PLT 275    Chemistries  Recent Labs  Lab 11/12/21 1846  11/13/21 0406  NA 139 138  K 3.4* 4.0  CL 105 105  CO2 28 27  GLUCOSE 116* 123*  BUN 12 12  CREATININE 0.80 0.87  CALCIUM 8.9 8.7*  MG  --  2.0  AST 18  --   ALT 15  --   ALKPHOS 96  --   BILITOT 0.4  --     Microbiology Results   Recent Results (from the past 240 hour(s))  Resp Panel by RT-PCR (Flu A&B, Covid) Nasopharyngeal Swab     Status: None   Collection Time: 11/13/21  2:11 AM   Specimen: Nasopharyngeal Swab; Nasopharyngeal(NP) swabs in vial transport medium  Result Value Ref Range Status   SARS Coronavirus 2 by RT PCR NEGATIVE NEGATIVE Final    Comment: (NOTE) SARS-CoV-2 target nucleic acids are NOT DETECTED.  The SARS-CoV-2 RNA is generally detectable in upper respiratory specimens during the acute phase of infection. The lowest concentration of SARS-CoV-2 viral copies this assay can detect is 138 copies/mL. A negative result does not preclude SARS-Cov-2 infection and should not be used as the sole basis for treatment or other patient management decisions. A negative result may occur with  improper specimen collection/handling, submission of specimen other than nasopharyngeal swab, presence of viral mutation(s) within the areas targeted by this assay, and inadequate number of viral copies(<138 copies/mL). A negative result must be combined with clinical observations, patient history, and epidemiological information. The expected result is Negative.  Fact Sheet for Patients:  EntrepreneurPulse.com.au  Fact Sheet for Healthcare Providers:  IncredibleEmployment.be  This test is no t yet approved or cleared by the [place] FDA and  has been authorized for detection and/or diagnosis of SARS-CoV-2 by FDA under an Emergency Use Authorization (EUA). This EUA will remain  in effect (meaning this test can be used) for the duration of the COVID-19 declaration under Section 564(b)(1) of the Act, 21 U.S.C.section 360bbb-3(b)(1),  unless the authorization is terminated  or revoked sooner.       Influenza A by PCR NEGATIVE NEGATIVE Final   Influenza B by PCR NEGATIVE NEGATIVE Final    Comment: (NOTE) The Xpert Xpress SARS-CoV-2/FLU/RSV plus assay is intended as an aid in the diagnosis of influenza from Nasopharyngeal swab specimens and should not be used as a sole basis for treatment. Nasal washings and aspirates are unacceptable for Xpert Xpress SARS-CoV-2/FLU/RSV testing.  Fact Sheet for Patients: EntrepreneurPulse.com.au  Fact Sheet  for Healthcare Providers: IncredibleEmployment.be  This test is not yet approved or cleared by the [place] and has been authorized for detection and/or diagnosis of SARS-CoV-2 by FDA under an Emergency Use Authorization (EUA). This EUA will remain in effect (meaning this test can be used) for the duration of the COVID-19 declaration under Section 564(b)(1) of the Act, 21 U.S.C. section 360bbb-3(b)(1), unless the authorization is terminated or revoked.  Performed at [facility], [address] [place], [place] [address]   MRSA Next Gen by PCR, Nasal     Status: None   Collection Time: 11/13/21  4:18 AM   Specimen: Nasal Mucosa; Nasal Swab  Result Value Ref Range Status   MRSA by PCR Next Gen NOT DETECTED NOT DETECTED Final    Comment: (NOTE) The GeneXpert MRSA Assay (FDA approved for NASAL specimens only), is one component of a comprehensive MRSA colonization surveillance program. It is not intended to diagnose MRSA infection nor to guide or monitor treatment for MRSA infections. Test performance is not FDA approved in patients less than 44 years old. Performed at [facility], [address] [place], [place] [address]     RADIOLOGY:  CT HEAD WO CONTRAST  Result Date: 11/12/2021 CLINICAL DATA:  Left-sided numbness, initial encounter EXAM: CT HEAD WITHOUT CONTRAST TECHNIQUE: Contiguous axial images  were obtained from the base of the skull through the vertex without intravenous contrast. RADIATION DOSE REDUCTION: This exam was performed according to the departmental dose-optimization program which includes automated exposure control, adjustment of the mA and/or kV according to patient size and/or use of iterative reconstruction technique. COMPARISON:  05/21/2021 FINDINGS: Brain: Chronic atrophic changes are again identified. Changes consistent with prior right MCA and left PCA infarcts are again seen and stable from prior exam. No findings to suggest acute hemorrhage, acute infarction or space-occupying mass lesion are noted. Vascular: No hyperdense vessel or unexpected calcification. Skull: Normal. Negative for fracture or focal lesion. Sinuses/Orbits: Orbits are within normal limits. Mild mucosal thickening is noted within the left maxillary antrum. Other: None. IMPRESSION: Chronic right MCA and left PCA infarcts are noted. Generalized atrophic changes are again seen. No acute abnormality noted. Electronically Signed   By: Inez Catalina M.D.   On: 11/12/2021 19:04   MR BRAIN W WO CONTRAST  Result Date: 11/13/2021 CLINICAL DATA:  61 year old male with neurologic deficit, left side numbness and altered mental status. Chronic right MCA and left PCA infarcts. EXAM: MRI HEAD WITHOUT AND WITH CONTRAST TECHNIQUE: Multiplanar, multiecho pulse sequences of the brain and surrounding structures were obtained without and with intravenous contrast. CONTRAST:  7.38mL GADAVIST GADOBUTROL 1 MMOL/ML IV SOLN COMPARISON:  Brain MRI 05/22/2021.  Head CT 11/12/2021. FINDINGS: Brain: Chronic encephalomalacia in the right MCA territory, right MCA/ACA watershed, right MCA/PCA watershed, and left PCA territories. Associated hemosiderin and gliosis. Some ex vacuo ventricular enlargement. No superimposed restricted diffusion to suggest acute infarction. No midline shift, mass effect, evidence of mass lesion, ventriculomegaly,  extra-axial collection or acute intracranial hemorrhage. Cervicomedullary junction and pituitary are within normal limits. Stable deep gray matter nuclei, chronic right caudate lacunar infarct. Brainstem and cerebellum appear negative. No abnormal enhancement identified. There is mild smooth dural thickening in the right hemisphere which has a benign appearance (series 19, image 16). Vascular: Major intracranial vascular flow voids are stable since last year. The major dural venous sinuses are enhancing and appear to be patent. Skull and upper cervical spine: Negative visible cervical spine. Visualized bone marrow signal is  within normal limits. Sinuses/Orbits: Negative orbits, mild motion artifact. Mild to moderate paranasal sinus mucosal thickening has not significantly changed from last year. Other: Trace retained secretions in the nasopharynx, also similar to last year. Trace right mastoid fluid. Grossly normal visible internal auditory structures. Negative visible scalp and face. IMPRESSION: 1. No acute intracranial abnormality. 2. Advanced chronic ischemic disease, stable from last year. Electronically Signed   By: Genevie Ann M.D.   On: 11/13/2021 07:33   US Carotid Bilateral (at [facility] and [facility] only)  Result Date: 11/13/2021 CLINICAL DATA:  TIA. Visual disturbance. Syncopal episode. History of hypertension, hyperlipidemia and smoking. EXAM: BILATERAL CAROTID DUPLEX ULTRASOUND TECHNIQUE: Pearline Cables scale imaging, color Doppler and duplex ultrasound were performed of bilateral carotid and vertebral arteries in the neck. COMPARISON:  09/24/2019 FINDINGS: Criteria: Quantification of carotid stenosis is based on velocity parameters that correlate the residual internal carotid diameter with NASCET-based stenosis levels, using the diameter of the distal internal carotid lumen as the denominator for stenosis measurement. The following velocity measurements were obtained: RIGHT ICA: 67/18 cm/sec CCA: 91/63 cm/sec SYSTOLIC  ICA/CCA RATIO:  0.8 ECA: 122 cm/sec LEFT ICA: 93/22 cm/sec CCA: 846/65 cm/sec SYSTOLIC ICA/CCA RATIO:  0.7 ECA: 146 cm/sec RIGHT CAROTID ARTERY: There is a minimal amount of eccentric echogenic plaque within the right carotid bulb (image 14 and 15), extending to involve the origin and proximal aspects of the right internal carotid artery (image 22), morphologically similar to the 08/2019 examination, and again not resulting in elevated peak systolic velocities within the interrogated course of the right internal carotid artery to suggest a hemodynamically significant stenosis. RIGHT VERTEBRAL ARTERY:  Antegrade Flow LEFT CAROTID ARTERY: There is a minimal amount of intimal thickening/atherosclerotic plaque involving the left carotid bulb (image 49), morphologically similar to the 08/2019 examination, and again not resulting in elevated peak systolic velocities within the interrogated course of the left internal carotid artery to suggest a hemodynamically significant stenosis. LEFT VERTEBRAL ARTERY:  Antegrade flow IMPRESSION: Minimal amount of bilateral atherosclerotic plaque, right subjectively greater than left, morphologically similar to the 08/2019 examination, and again not resulting in a hemodynamically significant stenosis within either internal carotid artery. Electronically Signed   By: Sandi Mariscal M.D.   On: 11/13/2021 11:15     CODE STATUS:     Code Status Orders  (From admission, onward)           Start     Ordered   11/13/21 0043  Full code  Continuous        11/13/21 0043           Code Status History     Date Active Date Inactive Code Status Order ID Comments User Context   05/22/2021 1044 05/23/2021 2249 Full Code [id number]  Collier Bullock, MD ED   05/09/2020 1830 05/12/2020 2126 Full Code [id number]  Barton Dubois, MD Inpatient   09/23/2019 1701 09/25/2019 1908 Full Code [id number]  Loletha Grayer, MD ED   12/22/2017 0132 12/25/2017 0058 Full Code [id number]  Ashok Pall,  MD Inpatient        TOTAL TIME TAKING CARE OF THIS PATIENT: 35 minutes.    Fritzi Mandes M.D  [facility]    CC: Primary care physician; Tammi Sou, MD

## 2021-11-14 LAB — ECHOCARDIOGRAM LIMITED BUBBLE STUDY: Single Plane A4C EF: 42.7 %

## 2021-11-15 ENCOUNTER — Encounter: Payer: Self-pay | Admitting: Family Medicine

## 2021-12-31 ENCOUNTER — Other Ambulatory Visit: Payer: Self-pay | Admitting: Family Medicine

## 2022-01-08 NOTE — Telephone Encounter (Signed)
Attempted to contact pt for AWV.  ?

## 2022-01-11 ENCOUNTER — Telehealth: Payer: Self-pay

## 2022-01-11 NOTE — Telephone Encounter (Signed)
Patient refill request.  Patient scheduled appt with Dr. Anitra Lauth for 4/13. ? ?[facility] [place] ? ?QUEtiapine (SEROQUEL) 400 MG tablet [[id number]]  ?

## 2022-01-14 ENCOUNTER — Other Ambulatory Visit: Payer: Self-pay | Admitting: Family Medicine

## 2022-01-14 MED ORDER — QUETIAPINE FUMARATE 400 MG PO TABS: 400.0000 mg | ORAL_TABLET | Freq: Every day | ORAL | 0 refills | Status: DC

## 2022-01-14 NOTE — Addendum Note (Signed)
Addended by: Kavin Leech on: 01/14/2022 09:20 AM ? ? Modules accepted: Orders ? ?

## 2022-01-30 ENCOUNTER — Other Ambulatory Visit: Payer: Self-pay | Admitting: Family Medicine

## 2022-02-04 ENCOUNTER — Other Ambulatory Visit: Payer: Self-pay | Admitting: Family Medicine

## 2022-02-04 NOTE — Telephone Encounter (Signed)
Pt last had appt 07/26/21 VV. Pt is requesting 90 d supply on medications, lisinopril and seroquel. Last bmet completed 1/17 by hospital MD.  ? ? ?Please review and advise. Meds pending ?

## 2022-02-04 NOTE — Telephone Encounter (Signed)
Med refill: ? ?Pt said he had to reschedule his appt frm this week to next Friday. [employer] has limited amount of drivers this week. ? ?Pt asked can he get a 90 day supply? ? ?QUEtiapine ?QUEtiapine (SEROQUEL) 400 MG tablet ? ? ?[id number] [place] [place] Phone:  [phone]  ?Fax:  [phone]  ?  ? ? ? ?

## 2022-02-04 NOTE — Telephone Encounter (Signed)
Pt called back about another medication refill ? ?lisinopril ?lisinopril (ZESTRIL) 40 MG tablet ? ? ?[id number] [place], [place] Phone:  [phone]  ?Fax:  [phone]  ?  ? ?

## 2022-02-05 MED ORDER — QUETIAPINE FUMARATE 400 MG PO TABS: 400.0000 mg | ORAL_TABLET | Freq: Every day | ORAL | 0 refills | Status: DC

## 2022-02-05 MED ORDER — LISINOPRIL 40 MG PO TABS: 40.0000 mg | ORAL_TABLET | Freq: Every day | ORAL | 0 refills | Status: DC

## 2022-02-05 NOTE — Telephone Encounter (Signed)
Tried calling patient, unable to LVM.  

## 2022-02-07 ENCOUNTER — Ambulatory Visit: Payer: Medicare Other | Admitting: Family Medicine

## 2022-02-13 ENCOUNTER — Telehealth: Payer: Self-pay

## 2022-02-13 NOTE — Telephone Encounter (Signed)
Received fax from [facility] on 4/18 for form completion for pt to receive power mobility device. Pt has upcoming appt on 4/21. ?

## 2022-02-15 ENCOUNTER — Ambulatory Visit (INDEPENDENT_AMBULATORY_CARE_PROVIDER_SITE_OTHER): Payer: Medicare Other | Admitting: Family Medicine

## 2022-02-15 ENCOUNTER — Encounter: Payer: Self-pay | Admitting: Family Medicine

## 2022-02-15 VITALS — BP 123/82 | HR 60 | Temp 97.4°F | Ht 67.0 in | Wt 196.2 lb

## 2022-02-15 DIAGNOSIS — F5101 Primary insomnia: Secondary | ICD-10-CM | POA: Diagnosis not present

## 2022-02-15 DIAGNOSIS — M24542 Contracture, left hand: Secondary | ICD-10-CM

## 2022-02-15 DIAGNOSIS — Z7689 Persons encountering health services in other specified circumstances: Secondary | ICD-10-CM | POA: Diagnosis not present

## 2022-02-15 DIAGNOSIS — I6930 Unspecified sequelae of cerebral infarction: Secondary | ICD-10-CM

## 2022-02-15 DIAGNOSIS — R29898 Other symptoms and signs involving the musculoskeletal system: Secondary | ICD-10-CM | POA: Diagnosis not present

## 2022-02-15 DIAGNOSIS — G459 Transient cerebral ischemic attack, unspecified: Secondary | ICD-10-CM

## 2022-02-15 DIAGNOSIS — R296 Repeated falls: Secondary | ICD-10-CM | POA: Diagnosis not present

## 2022-02-15 DIAGNOSIS — R2681 Unsteadiness on feet: Secondary | ICD-10-CM | POA: Diagnosis not present

## 2022-02-15 DIAGNOSIS — M24532 Contracture, left wrist: Secondary | ICD-10-CM

## 2022-02-15 DIAGNOSIS — I10 Essential (primary) hypertension: Secondary | ICD-10-CM

## 2022-02-15 LAB — BASIC METABOLIC PANEL
BUN: 14 mg/dL (ref 6–23)
CO2: 27 mEq/L (ref 19–32)
Calcium: 8.7 mg/dL (ref 8.4–10.5)
Chloride: 105 mEq/L (ref 96–112)
Creatinine, Ser: 0.92 mg/dL (ref 0.40–1.50)
GFR: 88.92 mL/min (ref >60.00)
Glucose, Bld: 89 mg/dL (ref 70–99)
Potassium: 3.3 mEq/L — ABNORMAL LOW (ref 3.5–5.1)
Sodium: 142 mEq/L (ref 135–145)

## 2022-02-15 MED ORDER — TRAZODONE HCL 100 MG PO TABS: ORAL_TABLET | ORAL | 1 refills | Status: AC

## 2022-02-15 MED ORDER — ATORVASTATIN CALCIUM 40 MG PO TABS: ORAL_TABLET | ORAL | 1 refills | Status: AC

## 2022-02-15 NOTE — Progress Notes (Addendum)
Today's visit is to conduct a mobility evaluation and f/u history of uncontrolled HTN and recent hospitalization for TIA. ?Patient is a 61 y.o. male who presents for follow-up uncontrolled hypertension, insomnia, and history of CVA with residual left-sided deficit. ?I last saw him 07/26/21--virtual visit. ?A/P as of that visit: ?"1) Uncontrolled HTN. ?Needs to start monitoring bp at home.  ?Says having battery issues with current cuff. ?I'll inc lisinopril to 40 qd and continue felodipine 10 qd. ?  ?2) Insomnia: stable on seroquel 400 qhs and trazodone 100 qhs. ?  ?Plan in person o/v with fasting lipids and bmet 2 mo." ? ?INTERIM HX: ?[name] was admitted to [facility] 1/16 to 11/13/2021 for worsening of his pre-existing left-sided neurologic deficits. ?He was diagnosed with TIA and Plavix was added to his aspirin.  Old/chronic CVAs noted on neuroimaging but nothing acute.  Carotid ultrasounds showed nonobstructive amount of atherosclerotic plaque, echo normal--including bubble study. ?He remained on Plendil, lisinopril, aspirin, atorvastatin, Seroquel, and trazodone at discharge. ?He says home blood pressure is "good".  No specific data as far as home blood pressure numbers go. ? ?He has a form today--mobility exam form for a Hoveround. ?He says his inability to walk steadily due to left-sided weakness has led to recurrent falls. ? ?1) This patient has the following mobility-related ADL(s) that will be significantly improved in the home: Ability to get to the bathroom to toilet/bathe, get to the kitchen to prepare meals/cook/eat, and get to the bedroom to groom/dress. ? ?2) A cane or walker will not allow for improvement in mobility-related ADL's in home because: He has demonstrated consistent inability to utilize a cane without feeling significantly unstable and having a fall. ? ?3) A manual wheelchair will not allow for improvement in mobility-related ADL's in home because: Chronic left arm, wrist, and hand  weakness and flexion contracture as residual effect of a CVA. ? (see objective measurements below) ? ?4) A scooter will not allow for improvement in mobility-related ADL's in home because: Patient cannot operate the tiller of a POV due to chronic left hand weakness and contracture as residual from a CVA. ? ?5) In my opinion, this patient can physically and mentally operate the power wheelchair safely.  Patient can shift his weight adequately. ? ?Distance with mobility aide: 50 feet walking with cane, unstable. ? ?Oxygen SAT levels: 95% room air ? ?Overall pain scale: 0 out of 10 ? ?Patient's weight: 196 pounds 3 ounces. ? ?Past Medical History:  ?Diagnosis Date  ? Alcoholism ([place])   ? Continues to abuse ETOH as of 01/2017; pt has poor insight into his problem.  Pt states that as of 04/2017 he has been 3 monthds sober.  ? Anxiety and depression   ? Arthritis   ? "hands, legs" (12/23/2017)  ? Chronic joint pain   ? COPD (chronic obstructive pulmonary disease) ([place])   ? Depression   ? Encounter for hemodialysis for acute renal failure ([place]) 04/2020  ? Poor PO intake/dehydration, with concurrent use of ACE-I, hctz, and NSAIDs. Emergency HD during hospitalization then d/c'd to SNF w sCr 1.3  ? Frequent headaches   ? "depends on BP" (12/23/2017)  ? History of kidney stones   ? Hypertension   ? Insomnia 08/26/2017  ? 08/23/17 ED visit for adverse rxn to taking ambien, PLUS + marijuana on UDS her and in ED---NO FURTHER AMBIEN OR ANY OTHER CONTROLLED SUBSTANCES WILL BE PRESCRIBED BY ME--PM  ? Pneumothorax, right   ? Polysubstance abuse ([place])   ?  Poor historian   ? UNRELIABLE HISTORIAN  ? Spastic hemiparesis ([place])   ? left arm (signif 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  Baclofen oral no help.  Botox inj started by Dr. Posey Pronto 07/2018--no help x 1.  Inj #2 done 10/2018.  ? Stroke [facility]) 2012  ? LUE residual deficit: flexion contracture and weakness of L hand.  Old bilateral cerebral infarcts involving the right  frontal and parietal lobes as well as the left occipital lobe.  Baclofen and botox via neuro for L arm contracture.  ? Stroke [facility])   ? "3 mini strokes since 2012" (12/23/2017)  ? Subdural hematoma ([place]) 01/29/2017  ? Subacute right-sided frontal subdural hematoma: found on CT in [place] ED, transferred to [facility] where this was non-operatively managed.  Pt admitted to the MDs at [facility] that he still drinks lots of whisky daily and they surmised that his frequent falls of late are due to this and frequent use of OTC sleep aids.  Near complete resolution on CT 03/27/18.  ? TIA (transient ischemic attack) 11/12/2021  ? presented with dizziness/vertigo, slurred speech, and worsening of his chronic L sided neuro deficits--CT and MR imaging neg acute, carotid duplex unchanged/no signif stenosis, echo bubble study normal  ? Venous insufficiency of both lower extremities   ? ?MEDS: ASA, atorvastatin, felodipine, lisinopril, quetiapine, trazodone. ? ?  02/15/2022  ?  9:36 AM 11/13/2021  ? 12:14 PM 11/13/2021  ?  8:34 AM  ?Vitals with BMI  ?Height 5\' 7"     ?Weight 196 lbs 3 oz    ?BMI 30.72    ?Systolic 902 409 735  ?Diastolic 82 87 329  ?Pulse 60 72 70  ? ?Alert, oriented x4. ?Pleasant, lucid thought and affect. ? ?UE strength: Left arm 4 out of 5 proximal strength, 4 out of 5 forearm strength at the elbow, wrist strength 0 out of 5 with obvious flexion contracture, hand and fingers strength 0 out of 5 with obvious flexion contracture. ?Right upper extremity with 5 out of 5 proximal and distal strength. ?Right shoulder, elbow, wrist, and fingers all with normal range of motion. ?Left shoulder with maximal abduction of 100 degrees, maximal flexion 100 degrees, maximal flexion 75 degrees, internal rotation and external rotation not significantly limited. ?Left elbow range of motion fully intact. ?Left wrist without any significant active range of motion.  Passive wrist extension to 90 degrees.  Passive finger extension to 150  degrees. ?Left lower extremity strength 5- out of 5 proximally, 5- out of 5 distally. ?Right lower extremity strength 5 out of 5 proximally and distally. ?He ambulates with a wide-based gait and is unsteady. ? ?CV: RRR, no m/r/g.   ?LUNGS: CTA bilat, nonlabored resps, good aeration in all lung fields. ?Extremities show no edema. ?Skin is without pressure ulcers. ?Patient can shift his weight adequately. ? ?A/P: ? ?#1 mobility assessment/exam. ?Residual left arm and leg weakness and left wrist and hand contracture from CVA in 2012. ?Progressive unsteady gait and recurrent falls plus his chronic neurologic impairment necessitate Hoveround. ?This will be used in his home and in the community.  He is willing and motivated to use the Hoveround. ?Patient can shift his own weight adequately. ? ?#2 hypertension, well controlled currently on lisinopril 40 mg a day and felodipine 10 mg a day. ?Basic metabolic panel ordered. ? ?3.  Insomnia.  Well-controlled on quetiapine 400 mg nightly and trazodone 100 mg nightly. ?Trazodone refilled today x90-day supply. ? ?#4 history of  CVA.  Recent TIA. ?Continue aspirin 81 mg a day, atorvastatin 40 mg a day, and blood pressure control with lisinopril 40 mg a day felodipine 10 mg a day. ? ?F/u: 26mo ? ?Spent 50 min with pt today reviewing HPI and mobility assessment requirements/forms, reviewing relevant past history, doing exam, reviewing and discussing lab and imaging data, and formulating plans. ? ?Signed:  Crissie Sickles, MD           02/18/2022 ? ? ?

## 2022-02-21 ENCOUNTER — Telehealth: Payer: Self-pay

## 2022-02-21 MED ORDER — POTASSIUM CHLORIDE CRYS ER 20 MEQ PO TBCR: 20.0000 meq | EXTENDED_RELEASE_TABLET | Freq: Every day | ORAL | 1 refills | Status: DC

## 2022-02-21 NOTE — Telephone Encounter (Signed)
-----   Message from Tammi Sou, MD sent at 02/16/2022  5:48 PM EDT ----- ?All labs normal except potassium just a little bit low. Please call in Klor-Con 20 mEq tabs, one tab daily, number 90, refill times one. ?

## 2022-02-26 ENCOUNTER — Emergency Department: Payer: Medicare Other

## 2022-02-26 ENCOUNTER — Other Ambulatory Visit: Payer: Self-pay

## 2022-02-26 ENCOUNTER — Telehealth: Payer: Self-pay

## 2022-02-26 ENCOUNTER — Emergency Department
Admission: EM | Admit: 2022-02-26 | Discharge: 2022-02-26 | Disposition: A | Discharge status: Home / Self Care | Payer: Medicare Other | Attending: Emergency Medicine | Admitting: Emergency Medicine

## 2022-02-26 DIAGNOSIS — M5431 Sciatica, right side: Secondary | ICD-10-CM | POA: Diagnosis not present

## 2022-02-26 DIAGNOSIS — M25551 Pain in right hip: Secondary | ICD-10-CM | POA: Diagnosis not present

## 2022-02-26 DIAGNOSIS — M545 Low back pain, unspecified: Secondary | ICD-10-CM | POA: Diagnosis not present

## 2022-02-26 MED ORDER — FENTANYL CITRATE PF 50 MCG/ML IJ SOSY
50.0000 ug | PREFILLED_SYRINGE | Freq: Once | INTRAMUSCULAR | Status: AC
  Administered 2022-02-26: 50 ug via INTRAMUSCULAR
  Filled 2022-02-26: qty 1

## 2022-02-26 MED ORDER — MELOXICAM 7.5 MG PO TABS
15.0000 mg | ORAL_TABLET | Freq: Once | ORAL | Status: AC
  Administered 2022-02-26: 15 mg via ORAL
  Filled 2022-02-26: qty 2

## 2022-02-26 MED ORDER — METHOCARBAMOL 500 MG PO TABS
1000.0000 mg | ORAL_TABLET | Freq: Once | ORAL | Status: AC
  Administered 2022-02-26: 1000 mg via ORAL
  Filled 2022-02-26: qty 2

## 2022-02-26 MED ORDER — METHOCARBAMOL 500 MG PO TABS: 500.0000 mg | ORAL_TABLET | Freq: Four times a day (QID) | ORAL | 1 refills | Status: DC

## 2022-02-26 MED ORDER — MELOXICAM 15 MG PO TABS: 15.0000 mg | ORAL_TABLET | Freq: Every day | ORAL | 2 refills | Status: DC

## 2022-02-26 MED ORDER — HYDROCODONE-ACETAMINOPHEN 5-325 MG PO TABS: 1.0000 | ORAL_TABLET | ORAL | 0 refills | Status: DC | PRN

## 2022-02-26 MED ORDER — HYDROCODONE-ACETAMINOPHEN 5-325 MG PO TABS
1.0000 | ORAL_TABLET | Freq: Once | ORAL | Status: AC
  Administered 2022-02-26: 1 via ORAL
  Filled 2022-02-26: qty 1

## 2022-02-26 NOTE — Telephone Encounter (Signed)
noted 

## 2022-02-26 NOTE — ED Provider Notes (Signed)
? ?[facility] ?Provider Note ? ?Patient Contact: 11:18 PM (approximate) ? ? ?History  ? ?Hip Pain ? ? ?HPI ? ?[name] is a 61 y.o. male who presents the emergency department for right hip pain that runs down his right leg.  I saw the patient in triage, ordered x-rays for him.  He has had no neurodeficits.  Exam remains reassuring ?  ? ? ?Physical Exam  ? ?Triage Vital Signs: ?ED Triage Vitals  ?Enc Vitals Group  ?   BP 02/26/22 1844 (!) 111/93  ?   Pulse Rate 02/26/22 1844 73  ?   Resp 02/26/22 1844 18  ?   Temp 02/26/22 1844 98.3 ?F (36.8 ?C)  ?   Temp Source 02/26/22 1844 Oral  ?   SpO2 02/26/22 1844 94 %  ?   Weight 02/26/22 1845 185 lb (83.9 kg)  ?   Height 02/26/22 1845 5\' 7"  (1.702 m)  ?   Head Circumference --   ?   Peak Flow --   ?   Pain Score 02/26/22 1845 9  ?   Pain Loc --   ?   Pain Edu? --   ?   Excl. in [place]? --   ? ? ?Most recent vital signs: ?Vitals:  ? 02/26/22 1844 02/26/22 2325  ?BP: (!) 111/93 (!) 121/94  ?Pulse: 73 78  ?Resp: 18 20  ?Temp: 98.3 ?F (36.8 ?C)   ?SpO2: 94% 95%  ? ? ? ?General: Alert and in no acute distress.  ?Cardiovascular:  Good peripheral perfusion ?Respiratory: Normal respiratory effort without tachypnea or retractions. Lungs CTAB.  ?Musculoskeletal: Full range of motion to all extremities.  Tenderness over the sciatic notch extending into the right hip.  No palpable abnormality.  Patient is still ambulatory at this time. ?Neurologic:  No gross focal neurologic deficits are appreciated.  ?Skin:   No rash noted ?Other: ? ? ?ED Results / Procedures / Treatments  ? ?Labs ?(all labs ordered are listed, but only abnormal results are displayed) ?Labs Reviewed - No data to display ? ? ?EKG ? ? ? ? ?RADIOLOGY ? ?I personally viewed and evaluated these images as part of my medical decision making, as well as reviewing the written report by the radiologist. ? ?ED Provider Interpretation: No acute findings.  Multilevel degenerative changes in the lumbar  spine ? ?DG Lumbar Spine 2-3 Views ? ?Result Date: 02/26/2022 ?CLINICAL DATA:  Atraumatic right hip pain that radiates down the right leg. EXAM: LUMBAR SPINE - 2-3 VIEW COMPARISON:  None Available. FINDINGS: There is no evidence of lumbar spine fracture. Alignment is normal. Mild-to-moderate severity multilevel endplate sclerosis is seen. This is most prominent at the levels of L3-L4 and L4-L5. Mild anterior osteophyte formation are also seen at these levels. Partial sacralization of the L5 vertebral body is noted. IMPRESSION: Mild-to-moderate severity multilevel degenerative changes in the lower lumbar spine. Electronically Signed   By: Virgina Norfolk M.D.   On: 02/26/2022 22:14  ? ?DG Hip Unilat W or Wo Pelvis 2-3 Views Right ? ?Result Date: 02/26/2022 ?CLINICAL DATA:  Atraumatic right hip pain. EXAM: DG HIP (WITH OR WITHOUT PELVIS) 2-3V RIGHT COMPARISON:  None Available. FINDINGS: There is no evidence of hip fracture or dislocation. There is no evidence of arthropathy or other focal bone abnormality. IMPRESSION: Negative. Electronically Signed   By: Virgina Norfolk M.D.   On: 02/26/2022 22:15   ? ?PROCEDURES: ? ?Critical Care performed: No ? ?Procedures ? ? ?MEDICATIONS  ORDERED IN ED: ?Medications  ?fentaNYL (SUBLIMAZE) injection 50 mcg (50 mcg Intramuscular Given 02/26/22 2123)  ?meloxicam (MOBIC) tablet 15 mg (15 mg Oral Given 02/26/22 2322)  ?methocarbamol (ROBAXIN) tablet 1,000 mg (1,000 mg Oral Given 02/26/22 2323)  ?HYDROcodone-acetaminophen (NORCO/VICODIN) 5-325 MG per tablet 1 tablet (1 tablet Oral Given 02/26/22 2322)  ? ? ? ?IMPRESSION / MDM / ASSESSMENT AND PLAN / ED COURSE  ?I reviewed the triage vital signs and the nursing notes. ?             ?               ? ?Differential diagnosis includes, but is not limited to, sciatica, bursitis, fracture ? ? ?Patient's diagnosis is consistent with patient presents with pain radiating down the right leg from the right hip.  Imaging was reassuring with degenerative  changes but no acute findings.  He was neurologically intact.  No indication for further work-up.  Patient will have symptom control medications at home.  Follow-up primary care as needed... Patient is given ED precautions to return to the ED for any worsening or new symptoms. ? ? ? ?  ? ? ?FINAL CLINICAL IMPRESSION(S) / ED DIAGNOSES  ? ?Final diagnoses:  ?Sciatica of right side  ? ? ? ?Rx / DC Orders  ? ?ED Discharge Orders   ? ?      Ordered  ?  HYDROcodone-acetaminophen (NORCO/VICODIN) 5-325 MG tablet  Every 4 hours PRN       ?Note to Pharmacy: Unable to send prescription electronically d/t EMR issues  ? 02/26/22 2331  ?  meloxicam (MOBIC) 15 MG tablet  Daily       ? 02/26/22 2331  ?  methocarbamol (ROBAXIN) 500 MG tablet  4 times daily       ? 02/26/22 2331  ? ?  ?  ? ?  ? ? ? ?Note:  This document was prepared using Dragon voice recognition software and may include unintentional dictation errors. ?  ?Darletta Moll, PA-C ?02/26/22 2335 ? ?  ?Lucrezia Starch, MD ?02/26/22 2344 ? ?

## 2022-02-26 NOTE — Telephone Encounter (Signed)
FYI  Please see below

## 2022-02-26 NOTE — Telephone Encounter (Signed)
Pt called stating that he was having some issues that is causing him to not be able to move well. Pt states that he is unable to come into office due to not being able to move well and transportation. Pt was offered a virtual visit and pt declined saying that he will just go to ED because he knows that PCP is going to tell him to anyway. Pt states he will call when he has more information on what is going on. ?

## 2022-02-26 NOTE — ED Provider Triage Note (Signed)
Emergency Medicine Provider Triage Evaluation Note ? ?[name] , a 61 y.o. male  was evaluated in triage.  Pt complains of right hip pain radiating abdominal leg.  Patient states that it 4 days ago he stood up, felt sharp pain shooting through his hip down his leg.  States that at rest he has no pain but with any movement or weightbearing the pain reappears.  Feels like his sciatica.  No falls or recent trauma.  No complaints of. ? ?Review of Systems  ?Positive: Right hip pain radiating down the right leg ?Negative: Bowel bladder dysfunction, saddle anesthesia, paresthesias, reported history of trauma ? ?Physical Exam  ?BP (!) 111/93 (BP Location: Right Arm)   Pulse 73   Temp 98.3 ?F (36.8 ?C) (Oral)   Resp 18   Ht 5\' 7"  (1.702 m)   Wt 83.9 kg   SpO2 94%   BMI 28.98 kg/m?  ?Gen:   Awake, no distress   ?Resp:  Normal effort  ?MSK:   Moves extremities without difficulty.  Tender over the right lateral hip ?Other:   ? ?Medical Decision Making  ?Medically screening exam initiated at 6:50 PM.  Appropriate orders placed.  [name] was informed that the remainder of the evaluation will be completed by another provider, this initial triage assessment does not replace that evaluation, and the importance of remaining in the ED until their evaluation is complete. ? ?Patient presents with right hip/leg pain that started with no prior trauma.  Feels like his "sciatica" according to the patient.  Patient will have x-ray of the lumbar spine and hip at this time. ?  ?Darletta Moll, PA-C ?02/26/22 1852 ? ?

## 2022-02-26 NOTE — ED Triage Notes (Signed)
Pt arrives with c/o right hip pain that started a few days ago. Per pt, when he tries to stand/walk its very painful. Pt denies injury.  ?

## 2022-02-28 NOTE — Telephone Encounter (Signed)
Spoke with Vaughan Basta at [facility] and addendum needed to 4/21 note stating yes patient is able to shift his own weight or no he is not. After completion, I will print note and fax to number. ? ? ?Please review and advise ? ?

## 2022-02-28 NOTE — Telephone Encounter (Signed)
Okay, addendum has been completed ?

## 2022-02-28 NOTE — Telephone Encounter (Signed)
Updated OV note printed and faxed ?

## 2022-03-01 ENCOUNTER — Telehealth: Payer: Self-pay

## 2022-03-01 NOTE — Telephone Encounter (Signed)
No opioids. ?

## 2022-03-01 NOTE — Telephone Encounter (Signed)
Patient was seen in ED, he is requesting something for pain until her can get in to see Dr. Anitra Lauth. ? ?I told him that I didn't have any appts this afternoon to offer him and provider will be out of office next week. ? ?Please call  [phone] ? ?

## 2022-03-01 NOTE — Telephone Encounter (Addendum)
Pt was originally prescribed Hydrocodone on 5/2 (16,0) by Darletta Moll, PA-C but was given written rx due to EMR issues. Additional info: pt is out of hydrocodone but still taking methocarbamol and Meloxicam. He said the dose for hydrocodone was not effective enough. His pain level currently is 10. Pt advised he may need an appt for this.  ? ?Please review and advise ? ?

## 2022-03-01 NOTE — Telephone Encounter (Signed)
Pt advised provider would not be prescribing medication without an appointment. He stated he did not understand why this was an issue. He would not request the medication if he did not need it or was in pain. I advised he was told prior to return call this was not a guarantee for medication refill since he was not seen by PCP for this. He said he would change doctors if medication could not be provided. He originally wanted to schedule soonest available appt but declined and said he would call back Monday.  ? ?Sent as FYI ?

## 2022-03-06 ENCOUNTER — Other Ambulatory Visit: Payer: Self-pay | Admitting: Family Medicine

## 2022-03-13 ENCOUNTER — Ambulatory Visit: Payer: Medicare Other

## 2022-03-21 ENCOUNTER — Telehealth: Payer: Self-pay

## 2022-03-21 ENCOUNTER — Ambulatory Visit: Payer: Medicare Other | Admitting: Family Medicine

## 2022-03-21 NOTE — Telephone Encounter (Signed)
Patient called at 11:15pm - he does not have transportation for today's visit, told patient we were not able to do virtual visit because nature of visit //  resch appt for 6/2 pt aware that office will call to set up transportation for future appt.   I have spoke Almyra Free at [employer] regarding transportation for patient on 06/02.  Almyra Free will call patient to give him pick up time.

## 2022-03-22 DIAGNOSIS — I69898 Other sequelae of other cerebrovascular disease: Secondary | ICD-10-CM | POA: Diagnosis not present

## 2022-03-22 DIAGNOSIS — J449 Chronic obstructive pulmonary disease, unspecified: Secondary | ICD-10-CM | POA: Diagnosis not present

## 2022-03-22 DIAGNOSIS — I69354 Hemiplegia and hemiparesis following cerebral infarction affecting left non-dominant side: Secondary | ICD-10-CM | POA: Diagnosis not present

## 2022-03-22 DIAGNOSIS — M1990 Unspecified osteoarthritis, unspecified site: Secondary | ICD-10-CM | POA: Diagnosis not present

## 2022-03-22 DIAGNOSIS — Z9181 History of falling: Secondary | ICD-10-CM | POA: Diagnosis not present

## 2022-03-22 NOTE — Telephone Encounter (Signed)
Waived no show fee

## 2022-03-23 ENCOUNTER — Other Ambulatory Visit: Payer: Self-pay | Admitting: Family Medicine

## 2022-03-26 ENCOUNTER — Telehealth: Payer: Self-pay | Admitting: Family Medicine

## 2022-03-26 NOTE — Telephone Encounter (Signed)
Left message for patient to schedule Annual Wellness Visit.  Please schedule (telephone/video call) with Nurse Health Advisor Tina Betterson, RN at [facility]. Please call [phone] ask for Kathy 

## 2022-03-29 ENCOUNTER — Ambulatory Visit (INDEPENDENT_AMBULATORY_CARE_PROVIDER_SITE_OTHER): Payer: Medicare Other | Admitting: Family Medicine

## 2022-03-29 ENCOUNTER — Encounter: Payer: Self-pay | Admitting: Family Medicine

## 2022-03-29 VITALS — BP 130/92 | HR 61 | Temp 98.7°F | Ht 67.0 in | Wt 191.8 lb

## 2022-03-29 DIAGNOSIS — M5441 Lumbago with sciatica, right side: Secondary | ICD-10-CM | POA: Diagnosis not present

## 2022-03-29 DIAGNOSIS — Z79899 Other long term (current) drug therapy: Secondary | ICD-10-CM | POA: Diagnosis not present

## 2022-03-29 DIAGNOSIS — M5442 Lumbago with sciatica, left side: Secondary | ICD-10-CM | POA: Diagnosis not present

## 2022-03-29 DIAGNOSIS — L602 Onychogryphosis: Secondary | ICD-10-CM | POA: Diagnosis not present

## 2022-03-29 DIAGNOSIS — M47816 Spondylosis without myelopathy or radiculopathy, lumbar region: Secondary | ICD-10-CM

## 2022-03-29 DIAGNOSIS — M545 Low back pain, unspecified: Secondary | ICD-10-CM

## 2022-03-29 DIAGNOSIS — M79674 Pain in right toe(s): Secondary | ICD-10-CM

## 2022-03-29 DIAGNOSIS — G8929 Other chronic pain: Secondary | ICD-10-CM | POA: Diagnosis not present

## 2022-03-29 DIAGNOSIS — M533 Sacrococcygeal disorders, not elsewhere classified: Secondary | ICD-10-CM

## 2022-03-29 MED ORDER — HYDROCODONE-ACETAMINOPHEN 5-325 MG PO TABS: 1.0000 | ORAL_TABLET | Freq: Four times a day (QID) | ORAL | 0 refills | Status: DC | PRN
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
## 2022-03-30 ENCOUNTER — Ambulatory Visit (INDEPENDENT_AMBULATORY_CARE_PROVIDER_SITE_OTHER): Payer: Medicare Other

## 2022-03-30 DIAGNOSIS — Z0000 Encounter for general adult medical examination without abnormal findings: Secondary | ICD-10-CM

## 2022-03-30 NOTE — Progress Notes (Signed)
Subjective:   [name] is a 61 y.o. male who presents for Medicare Annual/Subsequent preventive examination.  I connected with  [name] on 03/30/22 by an audio only telemedicine application and verified that I am speaking with the correct person using two identifiers.   I discussed the limitations, risks, security and privacy concerns of performing an evaluation and management service by telephone and the availability of in person appointments. I also discussed with the patient that there may be a patient responsible charge related to this service. The patient expressed understanding and verbally consented to this telephonic visit.  Location of Patient: Home Location of Provider:office  List any persons and their role that are participating in the visit with the patient.   [name]  Review of Systems    Defer to PCP Cardiac Risk Factors include: male gender;sedentary lifestyle     Objective:    Today's Vitals   03/30/22 1131  PainSc: 10-Worst pain ever   There is no height or weight on file to calculate BMI.     03/30/2022   11:48 AM 02/26/2022    6:46 PM 11/13/2021    4:00 AM 11/12/2021    6:46 PM 05/22/2021    6:21 PM 05/21/2021    9:56 PM 05/09/2020    4:00 PM  Advanced Directives  Does Patient Have a Medical Advance Directive? No No No No  No Unable to assess, patient is non-responsive or altered mental status  Would patient like information on creating a medical advance directive? No - Patient declined No - Patient declined No - Patient declined  No - Patient declined      Current Medications (verified) Outpatient Encounter Medications as of 03/30/2022  Medication Sig   aspirin EC 81 MG EC tablet Take 1 tablet (81 mg total) by mouth daily with breakfast. Swallow whole.   atorvastatin (LIPITOR) 40 MG tablet TAKE 1 TABLET BY MOUTH EVERY DAY   felodipine (PLENDIL) 10 MG 24 hr tablet TAKE 1 TABLET BY MOUTH EVERY DAY   HYDROcodone-acetaminophen  (NORCO/VICODIN) 5-325 MG tablet Take 1 tablet by mouth every 6 (six) hours as needed for moderate pain.   lisinopril (ZESTRIL) 40 MG tablet Take 1 tablet (40 mg total) by mouth daily.   meloxicam (MOBIC) 15 MG tablet Take 1 tablet (15 mg total) by mouth daily.   methocarbamol (ROBAXIN) 500 MG tablet Take 1 tablet (500 mg total) by mouth 4 (four) times daily.   potassium chloride SA (KLOR-CON M) 20 MEQ tablet Take 1 tablet (20 mEq total) by mouth daily.   QUEtiapine (SEROQUEL) 400 MG tablet TAKE 1 TABLET BY MOUTH AT BEDTIME.   traZODone (DESYREL) 100 MG tablet TAKE 1 TABLET BY MOUTH EVERYDAY AT BEDTIME   VIIBRYD 20 MG TABS TAKE 1 TABLET BY MOUTH EVERY DAY   No facility-administered encounter medications on file as of 03/30/2022.    Allergies (verified) Ambien [zolpidem]   History: Past Medical History:  Diagnosis Date   Alcoholism ([place])    Continues to abuse ETOH as of 01/2017; pt has poor insight into his problem.  Pt states that as of 04/2017 he has been 3 monthds sober.   Anxiety and depression    Arthritis    "hands, legs" (12/23/2017)   Chronic joint pain    COPD (chronic obstructive pulmonary disease) ([place])    Depression    Encounter for hemodialysis for acute renal failure ([place]) 04/2020   Poor PO intake/dehydration, with concurrent use  of ACE-I, hctz, and NSAIDs. Emergency HD during hospitalization then d/c'd to SNF w sCr 1.3   Frequent headaches    "depends on BP" (12/23/2017)   History of kidney stones    Hypertension    Insomnia 08/26/2017   08/23/17 ED visit for adverse rxn to taking ambien, PLUS + marijuana on UDS her and in ED---NO FURTHER AMBIEN OR ANY OTHER CONTROLLED SUBSTANCES WILL BE PRESCRIBED BY ME--PM   Pneumothorax, right    Polysubstance abuse ([place])    Poor historian    UNRELIABLE HISTORIAN   Spastic hemiparesis ([place])    left arm (signif 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  Baclofen oral no help.  Botox inj started by Dr. Posey Pronto 07/2018--no help x  1.  Inj #2 done 10/2018.   Stroke ([place]) 2012   LUE residual deficit: flexion contracture and weakness of L hand.  Old bilateral cerebral infarcts involving the right frontal and parietal lobes as well as the left occipital lobe.  Baclofen and botox via neuro for L arm contracture.   Stroke [facility])    "3 mini strokes since 2012" (12/23/2017)   Subdural hematoma ([place]) 01/29/2017   Subacute right-sided frontal subdural hematoma: found on CT in [place] ED, transferred to [facility] where this was non-operatively managed.  Pt admitted to the MDs at [facility] that he still drinks lots of whisky daily and they surmised that his frequent falls of late are due to this and frequent use of OTC sleep aids.  Near complete resolution on CT 03/27/18.   TIA (transient ischemic attack) 11/12/2021   presented with dizziness/vertigo, slurred speech, and worsening of his chronic L sided neuro deficits--CT and MR imaging neg acute, carotid duplex unchanged/no signif stenosis, echo bubble study normal   Venous insufficiency of both lower extremities    Past Surgical History:  Procedure Laterality Date   CHEST TUBE INSERTION     LUNG SURGERY     SHOULDER OPEN ROTATOR CUFF REPAIR Bilateral    Multiple rotator cuff surgeries on both sides.   TONSILLECTOMY     TRANSTHORACIC ECHOCARDIOGRAM  09/24/2019   EF 55-60%, grd I DD.   TRANSTHORACIC ECHOCARDIOGRAM  11/12/2021   Normal, no shunt   TYMPANOPLASTY Right    US CAROTIDS  09/24/2019   Minimal atherosclerotic plaque bilat; no flow obstruction   VIDEO ASSISTED THORACOSCOPY (VATS) W/TALC PLEUADESIS  2010 X2   Family History  Problem Relation Age of Onset   Arthritis Mother    Heart disease Father    Hypertension Father    Breast cancer Maternal Aunt    Alcohol abuse Maternal Uncle    Lung cancer Maternal Aunt    Cancer Brother        bone   Social History   Socioeconomic History   Marital status: Divorced    Spouse name: Not on file   Number of children: 2   Years of  education: Not on file   Highest education level: Not on file  Occupational History   Not on file  Tobacco Use   Smoking status: Every Day    Packs/day: 1.00    Years: 44.00    Pack years: 44.00    Types: Cigarettes   Smokeless tobacco: Never  Vaping Use   Vaping Use: Former  Substance and Sexual Activity   Alcohol use: No    Comment: Quit summer 2018. (12/23/2017)   Drug use: Yes    Frequency: 2.0 times per week  Types: Marijuana    Comment: 12/23/2017 "no more than once/day; ave 3-4 days/wk"   Sexual activity: Not Currently  Other Topics Concern   Not on file  Social History Narrative   Divorced, 1 son and 1 daughter in [place].   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   Smokes: 80 pack-yr hx, current as of 10/2016.   Alc: 1-2 pint of Jim Beam per week.   Was in AA > 20 yrs ago.   Marijuana regularly.   "Lots of drugs" in the past.   Social Determinants of Health   Financial Resource Strain: Low Risk    Difficulty of Paying Living Expenses: Not very hard  Food Insecurity: Landscape architect Present   Worried About Charity fundraiser in the Last Year: Sometimes true   Arboriculturist in the Last Year: Never true  Transportation Needs: Public librarian (Medical): Yes   Lack of Transportation (Non-Medical): Yes  Physical Activity: Inactive   Days of Exercise per Week: 0 days   Minutes of Exercise per Session: 0 min  Stress: Stress Concern Present   Feeling of Stress : Very much  Social Connections: Moderately Integrated   Frequency of Communication with Friends and Family: Three times a week   Frequency of Social Gatherings with Friends and Family: Once a week   Attends Religious Services: More than 4 times per year   Active Member of [employer] or Organizations: Not on file   Attends Music therapist: More than 4 times per year   Marital Status: Divorced    Tobacco Counseling Ready to quit: Not  Answered Counseling given: Not Answered   Clinical Intake:  Pre-visit preparation completed: Yes  Pain : 0-10 Pain Score: 10-Worst pain ever Pain Type: Chronic pain Pain Onset: More than a month ago Pain Frequency: Constant     Diabetes: No  How often do you need to have someone help you when you read instructions, pamphlets, or other written materials from your doctor or pharmacy?: 2 - Rarely  Diabetic?no         Activities of Daily Living    03/30/2022   11:48 AM 11/13/2021    4:00 AM  In your present state of health, do you have any difficulty performing the following activities:  Hearing? 1 0  Vision? 0 0  Difficulty concentrating or making decisions? 1 0  Walking or climbing stairs? 0 0  Dressing or bathing? 0 0  Doing errands, shopping? 1 0  Preparing Food and eating ? N   Using the Toilet? N   In the past six months, have you accidently leaked urine? Y   Do you have problems with loss of bowel control? N   Managing your Medications? N   Managing your Finances? N   Housekeeping or managing your Housekeeping? N     Patient Care Team: Tammi Sou, MD as PCP - General (Family Medicine) Alda Berthold, DO as Consulting Physician (Neurology)  Indicate any recent Medical Services you may have received from other than [facility] providers in the past year (date may be approximate).     Assessment:   This is a routine wellness examination for [name].  Hearing/Vision screen No results found.  Dietary issues and exercise activities discussed:     Goals Addressed   None   Depression Screen    03/30/2022   11:41 AM 02/15/2022    9:32 AM 12/28/2020  2:07 PM 12/10/2019   10:05 AM 03/17/2019    1:17 PM 08/07/2018   10:52 AM 05/09/2017    1:27 PM  PHQ 2/9 Scores  PHQ - 2 Score 0 0 0 0 6 2 6   PHQ- 9 Score   3 0 19 12 20     Fall Risk    03/30/2022   11:48 AM 02/15/2022    9:32 AM 01/01/2019    8:41 AM 10/26/2018    2:54 PM 07/24/2018    8:07 AM  Fall Risk    Falls in the past year? 1 0 1 1 Yes  Number falls in past yr: 0 0 1 1 2  or more  Injury with Fall? 0 0 0 0 No  Risk Factor Category      High Fall Risk  Risk for fall due to : History of fall(s);Impaired mobility  Impaired balance/gait Impaired balance/gait;Impaired mobility Impaired balance/gait  Follow up Falls evaluation completed Falls evaluation completed Falls evaluation completed;Education provided;Falls prevention discussed Falls evaluation completed;Education provided;Falls prevention discussed Falls evaluation completed;Education provided;Falls prevention discussed    FALL RISK PREVENTION PERTAINING TO THE HOME:  Any stairs in or around the home? Yes  If so, are there any without handrails? Yes  Home free of loose throw rugs in walkways, pet beds, electrical cords, etc? Yes  Adequate lighting in your home to reduce risk of falls? Yes   ASSISTIVE DEVICES UTILIZED TO PREVENT FALLS:  Life alert? No  Use of a cane, walker or w/c? Yes  Grab bars in the bathroom? Yes  Shower chair or bench in shower? Yes  Elevated toilet seat or a handicapped toilet? No   TIMED UP AND GO:  Was the test performed?  n/a .  Length of time to ambulate 10 feet: n/a sec.     Cognitive Function:    05/09/2017    1:29 PM  MMSE - Mini Mental State Exam  Orientation to time 5  Orientation to Place 5  Registration 3  Attention/ Calculation 5  Recall 2  Language- name 2 objects 2  Language- repeat 1  Language- follow 3 step command 3  Language- read & follow direction 1  Write a sentence 1  Copy design 1  Total score 29        03/30/2022   11:50 AM  6CIT Screen  What Year? 0 points  What month? 0 points  What time? 0 points  Count back from 20 0 points  Months in reverse 2 points  Repeat phrase 2 points  Total Score 4 points    Immunizations Immunization History  Administered Date(s) Administered   Influenza,inj,Quad PF,6+ Mos 11/21/2016   Pneumococcal Polysaccharide-23  12/24/2017    TDAP status: Up to date  Flu Vaccine status: Up to date  Pneumococcal vaccine status: Due, Education has been provided regarding the importance of this vaccine. Advised may receive this vaccine at local pharmacy or Health Dept. Aware to provide a copy of the vaccination record if obtained from local pharmacy or Health Dept. Verbalized acceptance and understanding.  Covid-19 vaccine status: Declined, Education has been provided regarding the importance of this vaccine but patient still declined. Advised may receive this vaccine at local pharmacy or Health Dept.or vaccine clinic. Aware to provide a copy of the vaccination record if obtained from local pharmacy or Health Dept. Verbalized acceptance and understanding.  Qualifies for Shingles Vaccine? Yes   Zostavax completed No   Shingrix Completed?: No.    Education has been provided  regarding the importance of this vaccine. Patient has been advised to call insurance company to determine out of pocket expense if they have not yet received this vaccine. Advised may also receive vaccine at local pharmacy or Health Dept. Verbalized acceptance and understanding.  Screening Tests Health Maintenance  Topic Date Due   COVID-19 Vaccine (1) 04/15/2022 (Originally 09/28/1961)   Zoster Vaccines- Shingrix (1 of 2) 05/17/2022 (Originally 03/28/1978)   COLONOSCOPY (Pts 45-30yrs Insurance coverage will need to be confirmed)  09/29/2022 (Originally 03/28/2004)   TETANUS/TDAP  02/16/2023 (Originally 03/28/1980)   INFLUENZA VACCINE  05/28/2022   Hepatitis C Screening  Completed   HIV Screening  Completed   HPV VACCINES  Aged Out    Health Maintenance  There are no preventive care reminders to display for this patient.   Colorectal cancer screening: Referral to GI placed pt decliend. Pt aware the office will call re: appt.  Lung Cancer Screening: (Low Dose CT Chest recommended if Age 59-80 years, 30 pack-year currently smoking OR have quit w/in  15years.) does not qualify.   Lung Cancer Screening Referral: n/a  Additional Screening:  Hepatitis C Screening: does qualify; Completed 05/09/2017  Vision Screening: Recommended annual ophthalmology exams for early detection of glaucoma and other disorders of the eye. Is the patient up to date with their annual eye exam?  Yes  Who is the provider or what is the name of the office in which the patient attends annual eye exams? N/a If pt is not established with a provider, would they like to be referred to a provider to establish care? No .   Dental Screening: Recommended annual dental exams for proper oral hygiene  Community Resource Referral / Chronic Care Management: CRR required this visit?  Yes   CCM required this visit?  No      Plan:     I have personally reviewed and noted the following in the patient's chart:   Medical and social history Use of alcohol, tobacco or illicit drugs  Current medications and supplements including opioid prescriptions. Patient is currently taking opioid prescriptions. Information provided to patient regarding non-opioid alternatives. Patient advised to discuss non-opioid treatment plan with their provider. Functional ability and status Nutritional status Physical activity Advanced directives List of other physicians Hospitalizations, surgeries, and ER visits in previous 12 months Vitals Screenings to include cognitive, depression, and falls Referrals and appointments  In addition, I have reviewed and discussed with patient certain preventive protocols, quality metrics, and best practice recommendations. A written personalized care plan for preventive services as well as general preventive health recommendations were provided to patient.     Octaviano Glow, CMA   03/30/2022   Nurse Notes:   Mr. Sanjuan , Thank you for taking time to come for your Medicare Wellness Visit. I appreciate your ongoing commitment to your health goals. Please  review the following plan we discussed and let me know if I can assist you in the future.   These are the goals we discussed:  Goals      Quit smoking / using tobacco     Quit smoking          This is a list of the screening recommended for you and due dates:  Health Maintenance  Topic Date Due   COVID-19 Vaccine (1) 04/15/2022*   Zoster (Shingles) Vaccine (1 of 2) 05/17/2022*   Colon Cancer Screening  09/29/2022*   Tetanus Vaccine  02/16/2023*   Flu Shot  05/28/2022   Hepatitis  C Screening: USPSTF Recommendation to screen - Ages 34-79 yo.  Completed   HIV Screening  Completed   HPV Vaccine  Aged Out  *Topic was postponed. The date shown is not the original due date.

## 2022-03-30 NOTE — Patient Instructions (Signed)
Health Maintenance, Male Adopting a healthy lifestyle and getting preventive care are important in promoting health and wellness. Ask your health care provider about: The right schedule for you to have regular tests and exams. Things you can do on your own to prevent diseases and keep yourself healthy. What should I know about diet, weight, and exercise? Eat a healthy diet  Eat a diet that includes plenty of vegetables, fruits, low-fat dairy products, and lean protein. Do not eat a lot of foods that are high in solid fats, added sugars, or sodium. Maintain a healthy weight Body mass index (BMI) is a measurement that can be used to identify possible weight problems. It estimates body fat based on height and weight. Your health care provider can help determine your BMI and help you achieve or maintain a healthy weight. Get regular exercise Get regular exercise. This is one of the most important things you can do for your health. Most adults should: Exercise for at least 150 minutes each week. The exercise should increase your heart rate and make you sweat (moderate-intensity exercise). Do strengthening exercises at least twice a week. This is in addition to the moderate-intensity exercise. Spend less time sitting. Even light physical activity can be beneficial. Watch cholesterol and blood lipids Have your blood tested for lipids and cholesterol at 63 years of age, then have this test every 5 years. You may need to have your cholesterol levels checked more often if: Your lipid or cholesterol levels are high. You are older than 63 years of age. You are at high risk for heart disease. What should I know about cancer screening? Many types of cancers can be detected early and may often be prevented. Depending on your health history and family history, you may need to have cancer screening at various ages. This may include screening for: Colorectal cancer. Prostate cancer. Skin cancer. Lung  cancer. What should I know about heart disease, diabetes, and high blood pressure? Blood pressure and heart disease High blood pressure causes heart disease and increases the risk of stroke. This is more likely to develop in people who have high blood pressure readings or are overweight. Talk with your health care provider about your target blood pressure readings. Have your blood pressure checked: Every 3-5 years if you are 18-39 years of age. Every year if you are 40 years old or older. If you are between the ages of 65 and 75 and are a current or former smoker, ask your health care provider if you should have a one-time screening for abdominal aortic aneurysm (AAA). Diabetes Have regular diabetes screenings. This checks your fasting blood sugar level. Have the screening done: Once every three years after age 45 if you are at a normal weight and have a low risk for diabetes. More often and at a younger age if you are overweight or have a high risk for diabetes. What should I know about preventing infection? Hepatitis B If you have a higher risk for hepatitis B, you should be screened for this virus. Talk with your health care provider to find out if you are at risk for hepatitis B infection. Hepatitis C Blood testing is recommended for: Everyone born from 1945 through 1965. Anyone with known risk factors for hepatitis C. Sexually transmitted infections (STIs) You should be screened each year for STIs, including gonorrhea and chlamydia, if: You are sexually active and are younger than 63 years of age. You are older than 63 years of age and your   health care provider tells you that you are at risk for this type of infection. Your sexual activity has changed since you were last screened, and you are at increased risk for chlamydia or gonorrhea. Ask your health care provider if you are at risk. Ask your health care provider about whether you are at high risk for HIV. Your health care provider  may recommend a prescription medicine to help prevent HIV infection. If you choose to take medicine to prevent HIV, you should first get tested for HIV. You should then be tested every 3 months for as long as you are taking the medicine. Follow these instructions at home: Alcohol use Do not drink alcohol if your health care provider tells you not to drink. If you drink alcohol: Limit how much you have to 0-2 drinks a day. Know how much alcohol is in your drink. In the [place] one drink equals one 12 oz bottle of beer (355 mL), one 5 oz glass of wine (148 mL), or one 1 oz glass of hard liquor (44 mL). Lifestyle Do not use any products that contain nicotine or tobacco. These products include cigarettes, chewing tobacco, and vaping devices, such as e-cigarettes. If you need help quitting, ask your health care provider. Do not use street drugs. Do not share needles. Ask your health care provider for help if you need support or information about quitting drugs. General instructions Schedule regular health, dental, and eye exams. Stay current with your vaccines. Tell your health care provider if: You often feel depressed. You have ever been abused or do not feel safe at home. Summary Adopting a healthy lifestyle and getting preventive care are important in promoting health and wellness. Follow your health care provider's instructions about healthy diet, exercising, and getting tested or screened for diseases. Follow your health care provider's instructions on monitoring your cholesterol and blood pressure. This information is not intended to replace advice given to you by your health care provider. Make sure you discuss any questions you have with your health care provider. Document Revised: 03/05/2021 Document Reviewed: 03/05/2021 Elsevier Patient Education  2023 Elsevier Inc.  

## 2022-03-31 LAB — DRUG MONITORING PANEL 376104, URINE
Amphetamines: NEGATIVE ng/mL (ref <500)
Barbiturates: NEGATIVE ng/mL (ref <300)
Benzodiazepines: NEGATIVE ng/mL (ref <100)
Cocaine Metabolite: NEGATIVE ng/mL (ref <150)
Desmethyltramadol: NEGATIVE ng/mL (ref <100)
Opiates: NEGATIVE ng/mL (ref <100)
Oxycodone: NEGATIVE ng/mL (ref <100)
Tramadol: NEGATIVE ng/mL (ref <100)

## 2022-03-31 LAB — DM TEMPLATE

## 2022-04-01 ENCOUNTER — Telehealth: Payer: Self-pay

## 2022-04-01 NOTE — Telephone Encounter (Signed)
   Telephone encounter was:  Unsuccessful.  04/01/2022 Name: [name] MRN: [id number] DOB: [birth year]  Unsuccessful outbound call made today to assist with:  Food Insecurity and Financial Difficulties related to financial strain  Outreach Attempt:  1st Attempt  A HIPAA compliant voice message was left requesting a return call.  Instructed patient to call back at  earliest convenience. [place], Care Management  [phone] [address], [place], [place] [address] Phone: [phone] Email: [email or web address]

## 2022-04-03 ENCOUNTER — Telehealth: Payer: Self-pay

## 2022-04-03 NOTE — Telephone Encounter (Signed)
   Telephone encounter was:  Successful.  04/03/2022 Name: CRAIGE PATEL MRN: [id number] DOB: [birth year]  Maia Petties Stalvey is a 61 y.o. year old male who is a primary care patient of McGowen, Adrian Blackwater, MD . The community resource team was consulted for assistance with Food Insecurity and Financial Difficulties related to financial Strain  Care guide performed the following interventions: Patient provided with information about care guide support team and interviewed to confirm resource needs.Patient stated he needed resources to help with bills and food. Patient requested that I mail and email resources to him  Follow Up Plan:  Care guide will follow up with patient by phone over the next day    [place], Care Management  [phone] [address], [place], [place] [address] Phone: [phone] Email: [email or web address]

## 2022-04-05 ENCOUNTER — Telehealth: Payer: Self-pay

## 2022-04-05 NOTE — Telephone Encounter (Signed)
   Telephone encounter was:  Unsuccessful.  04/05/2022 Name: [name] MRN: [id number] DOB: 01-Sep-1961  Unsuccessful outbound call made today to assist with:  Food Insecurity  Follow up call  A HIPAA compliant voice message was left requesting a return call.  Instructed patient to call back at earliest convenience.  [place], Care Management  [phone] [address], [place], [place] [address] Phone: [phone] Email: [email or web address]

## 2022-04-09 ENCOUNTER — Telehealth: Payer: Self-pay

## 2022-04-09 NOTE — Telephone Encounter (Signed)
Provider informed regarding pt doing video visit. He will continue with this.

## 2022-04-09 NOTE — Telephone Encounter (Signed)
Pt called stating that he has 3 tick bites that he need abx for. Advise pt that he will need to have an appt with PCP. Pt is scheduled for VV on 04/10/22 at 340. Will discuss, transportation to have pt come in office.   Please advise if any other appropriate suggestions

## 2022-04-09 NOTE — Telephone Encounter (Signed)
Pt states he can not do in office tomorrow even if we can get him transportation.

## 2022-04-10 ENCOUNTER — Telehealth (INDEPENDENT_AMBULATORY_CARE_PROVIDER_SITE_OTHER): Payer: Medicare Other | Admitting: Family Medicine

## 2022-04-10 ENCOUNTER — Encounter: Payer: Self-pay | Admitting: Family Medicine

## 2022-04-10 DIAGNOSIS — S40862A Insect bite (nonvenomous) of left upper arm, initial encounter: Secondary | ICD-10-CM

## 2022-04-10 DIAGNOSIS — S30860A Insect bite (nonvenomous) of lower back and pelvis, initial encounter: Secondary | ICD-10-CM | POA: Diagnosis not present

## 2022-04-10 DIAGNOSIS — R21 Rash and other nonspecific skin eruption: Secondary | ICD-10-CM

## 2022-04-10 DIAGNOSIS — W57XXXA Bitten or stung by nonvenomous insect and other nonvenomous arthropods, initial encounter: Secondary | ICD-10-CM

## 2022-04-10 MED ORDER — METHOCARBAMOL 500 MG PO TABS: 500.0000 mg | ORAL_TABLET | Freq: Four times a day (QID) | ORAL | 1 refills | Status: DC | PRN

## 2022-04-10 MED ORDER — DOXYCYCLINE HYCLATE 100 MG PO CAPS: 100.0000 mg | ORAL_CAPSULE | Freq: Two times a day (BID) | ORAL | 0 refills | Status: AC

## 2022-04-10 NOTE — Progress Notes (Signed)
Virtual Visit via Video Note  I connected with [name] on 04/10/22 at  3:20 PM EDT by a video enabled telemedicine application and verified that I am speaking with the correct person using two identifiers.  Location patient: [place] Location provider:work or home office Persons participating in the virtual visit: patient, provider  I discussed the limitations and requested verbal permission for telemedicine visit. The patient expressed understanding and agreed to proceed.   HPI: 61 y/o male being seen today for tick bites. About a week ago he noted some small ticks--> 2 on his back and 1 on his left upper arm. Initially the bite areas just had a pink swelling consistent with an insect bite.  He says then he noted the area of redness and swelling extend.  He looked online and read that if it looks like a bull's-eye rash then he would need antibiotic. He states that the 1 on his back looks like a bull's-eye and is gradually enlarging.  Says the one on his arm looked similar but has gone down some. He has no myalgias, arthralgias, fever, or headaches. .   ROS: See pertinent positives and negatives per HPI.  Past Medical History:  Diagnosis Date   Alcoholism ([place])    Continues to abuse ETOH as of 01/2017; pt has poor insight into his problem.  Pt states that as of 04/2017 he has been 3 monthds sober.   Anxiety and depression    Arthritis    "hands, legs" (12/23/2017)   Chronic joint pain    COPD (chronic obstructive pulmonary disease) ([place])    Depression    Encounter for hemodialysis for acute renal failure ([place]) 04/2020   Poor PO intake/dehydration, with concurrent use of ACE-I, hctz, and NSAIDs. Emergency HD during hospitalization then d/c'd to SNF w sCr 1.3   Frequent headaches    "depends on BP" (12/23/2017)   History of kidney stones    Hypertension    Insomnia 08/26/2017   08/23/17 ED visit for adverse rxn to taking ambien, PLUS + marijuana on UDS her and in ED---NO FURTHER AMBIEN OR ANY  OTHER CONTROLLED SUBSTANCES WILL BE PRESCRIBED BY ME--PM   Pneumothorax, right    Polysubstance abuse ([place])    Poor historian    UNRELIABLE HISTORIAN   Spastic hemiparesis ([place])    left arm (signif 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  Baclofen oral no help.  Botox inj started by Dr. Posey Pronto 07/2018--no help x 1.  Inj #2 done 10/2018.   Stroke ([place]) 2012   LUE residual deficit: flexion contracture and weakness of L hand.  Old bilateral cerebral infarcts involving the right frontal and parietal lobes as well as the left occipital lobe.  Baclofen and botox via neuro for L arm contracture.   Stroke [facility])    "3 mini strokes since 2012" (12/23/2017)   Subdural hematoma ([place]) 01/29/2017   Subacute right-sided frontal subdural hematoma: found on CT in [place] ED, transferred to [facility] where this was non-operatively managed.  Pt admitted to the MDs at [facility] that he still drinks lots of whisky daily and they surmised that his frequent falls of late are due to this and frequent use of OTC sleep aids.  Near complete resolution on CT 03/27/18.   TIA (transient ischemic attack) 11/12/2021   presented with dizziness/vertigo, slurred speech, and worsening of his chronic L sided neuro deficits--CT and MR imaging neg acute, carotid duplex unchanged/no signif stenosis, echo bubble study normal   Venous  insufficiency of both lower extremities     Past Surgical History:  Procedure Laterality Date   CHEST TUBE INSERTION     LUNG SURGERY     SHOULDER OPEN ROTATOR CUFF REPAIR Bilateral    Multiple rotator cuff surgeries on both sides.   TONSILLECTOMY     TRANSTHORACIC ECHOCARDIOGRAM  09/24/2019   EF 55-60%, grd I DD.   TRANSTHORACIC ECHOCARDIOGRAM  11/12/2021   Normal, no shunt   TYMPANOPLASTY Right    US CAROTIDS  09/24/2019   Minimal atherosclerotic plaque bilat; no flow obstruction   VIDEO ASSISTED THORACOSCOPY (VATS) W/TALC PLEUADESIS  2010 X2     Current Outpatient Medications:    aspirin EC  81 MG EC tablet, Take 1 tablet (81 mg total) by mouth daily with breakfast. Swallow whole., Disp: 30 tablet, Rfl: 11   atorvastatin (LIPITOR) 40 MG tablet, TAKE 1 TABLET BY MOUTH EVERY DAY, Disp: 90 tablet, Rfl: 1   felodipine (PLENDIL) 10 MG 24 hr tablet, TAKE 1 TABLET BY MOUTH EVERY DAY, Disp: 90 tablet, Rfl: 1   HYDROcodone-acetaminophen (NORCO/VICODIN) 5-325 MG tablet, Take 1 tablet by mouth every 6 (six) hours as needed for moderate pain., Disp: 45 tablet, Rfl: 0   lisinopril (ZESTRIL) 40 MG tablet, Take 1 tablet (40 mg total) by mouth daily., Disp: 90 tablet, Rfl: 0   meloxicam (MOBIC) 15 MG tablet, Take 1 tablet (15 mg total) by mouth daily., Disp: 30 tablet, Rfl: 2   methocarbamol (ROBAXIN) 500 MG tablet, Take 1 tablet (500 mg total) by mouth 4 (four) times daily., Disp: 16 tablet, Rfl: 1   potassium chloride SA (KLOR-CON M) 20 MEQ tablet, Take 1 tablet (20 mEq total) by mouth daily., Disp: 90 tablet, Rfl: 1   QUEtiapine (SEROQUEL) 400 MG tablet, TAKE 1 TABLET BY MOUTH AT BEDTIME., Disp: 90 tablet, Rfl: 1   traZODone (DESYREL) 100 MG tablet, TAKE 1 TABLET BY MOUTH EVERYDAY AT BEDTIME, Disp: 90 tablet, Rfl: 1   VIIBRYD 20 MG TABS, TAKE 1 TABLET BY MOUTH EVERY DAY, Disp: 30 tablet, Rfl: 2  EXAM:  VITALS per patient if applicable:     06/30/7341    2:00 PM 03/29/2022    1:47 PM 02/26/2022   11:25 PM  Vitals with BMI  Height  5\' 7"    Weight  191 lbs 13 oz   BMI  87.68   Systolic 115 726 203  Diastolic 92 97 94  Pulse  61 78    GENERAL: alert, oriented, appears well and in no acute distress Skin: Left upper arm has a small splotch of pinkish discoloration that looks like it may be in the form of a hive. He is unable to show me the lesion on his back.  HEENT: atraumatic, conjunttiva clear, no obvious abnormalities on inspection of external nose and ears  NECK: normal movements of the head and neck  LUNGS: on inspection no signs of respiratory distress, breathing rate appears normal,  no obvious gross SOB, gasping or wheezing  CV: no obvious cyanosis  MS: moves all visible extremities without noticeable abnormality  PSYCH/NEURO: pleasant and cooperative, no obvious depression or anxiety, speech and thought processing grossly intact  LABS: none today    Chemistry      Component Value Date/Time   NA 142 02/15/2022 1007   NA 134 (A) 01/29/2017 0000   K 3.3 (L) 02/15/2022 1007   CL 105 02/15/2022 1007   CO2 27 02/15/2022 1007   BUN 14 02/15/2022 1007  BUN 28 (A) 01/29/2017 0000   CREATININE 0.92 02/15/2022 1007   CREATININE 1.01 04/03/2018 1528      Component Value Date/Time   CALCIUM 8.7 02/15/2022 1007   ALKPHOS 96 11/12/2021 1846   AST 18 11/12/2021 1846   ALT 15 11/12/2021 1846   BILITOT 0.4 11/12/2021 1846     ASSESSMENT AND PLAN:  Discussed the following assessment and plan:  #1 tick bites.  Question of bull's-eye rash.  No systemic symptoms. Unclear how long the ticks were on. We will treat for early Lyme disease: Doxycycline 100 twice daily x14 days.   I discussed the assessment and treatment plan with the patient. The patient was provided an opportunity to ask questions and all were answered. The patient agreed with the plan and demonstrated an understanding of the instructions.   F/u: prn  Signed:  Crissie Sickles, MD           04/10/2022

## 2022-04-15 ENCOUNTER — Ambulatory Visit: Payer: Medicare Other | Admitting: Family Medicine

## 2022-04-15 NOTE — Progress Notes (Deleted)
OFFICE VISIT  04/15/2022  CC: No chief complaint on file.   Patient is a 61 y.o. male who presents for scheduled removal of right great toenail.  HPI: Trauma to right toenail in the past.  Has hurt for quite a while and is gradually been worsening. At his last follow-up with me in the office on 03/29/2022 we discussed this and he has elected to have it removed.  Past Medical History:  Diagnosis Date   Alcoholism ([place])    Continues to abuse ETOH as of 01/2017; pt has poor insight into his problem.  Pt states that as of 04/2017 he has been 3 monthds sober.   Anxiety and depression    Arthritis    "hands, legs" (12/23/2017)   Chronic joint pain    COPD (chronic obstructive pulmonary disease) ([place])    Depression    Encounter for hemodialysis for acute renal failure ([place]) 04/2020   Poor PO intake/dehydration, with concurrent use of ACE-I, hctz, and NSAIDs. Emergency HD during hospitalization then d/c'd to SNF w sCr 1.3   Frequent headaches    "depends on BP" (12/23/2017)   History of kidney stones    Hypertension    Insomnia 08/26/2017   08/23/17 ED visit for adverse rxn to taking ambien, PLUS + marijuana on UDS her and in ED---NO FURTHER AMBIEN OR ANY OTHER CONTROLLED SUBSTANCES WILL BE PRESCRIBED BY ME--PM   Pneumothorax, right    Polysubstance abuse ([place])    Poor historian    UNRELIABLE HISTORIAN   Spastic hemiparesis ([place])    left arm (signif 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  Baclofen oral no help.  Botox inj started by Dr. Posey Pronto 07/2018--no help x 1.  Inj #2 done 10/2018.   Stroke ([place]) 2012   LUE residual deficit: flexion contracture and weakness of L hand.  Old bilateral cerebral infarcts involving the right frontal and parietal lobes as well as the left occipital lobe.  Baclofen and botox via neuro for L arm contracture.   Stroke [facility])    "3 mini strokes since 2012" (12/23/2017)   Subdural hematoma ([place]) 01/29/2017   Subacute right-sided frontal subdural  hematoma: found on CT in [place] ED, transferred to [facility] where this was non-operatively managed.  Pt admitted to the MDs at [facility] that he still drinks lots of whisky daily and they surmised that his frequent falls of late are due to this and frequent use of OTC sleep aids.  Near complete resolution on CT 03/27/18.   TIA (transient ischemic attack) 11/12/2021   presented with dizziness/vertigo, slurred speech, and worsening of his chronic L sided neuro deficits--CT and MR imaging neg acute, carotid duplex unchanged/no signif stenosis, echo bubble study normal   Venous insufficiency of both lower extremities     Past Surgical History:  Procedure Laterality Date   CHEST TUBE INSERTION     LUNG SURGERY     SHOULDER OPEN ROTATOR CUFF REPAIR Bilateral    Multiple rotator cuff surgeries on both sides.   TONSILLECTOMY     TRANSTHORACIC ECHOCARDIOGRAM  09/24/2019   EF 55-60%, grd I DD.   TRANSTHORACIC ECHOCARDIOGRAM  11/12/2021   Normal, no shunt   TYMPANOPLASTY Right    US CAROTIDS  09/24/2019   Minimal atherosclerotic plaque bilat; no flow obstruction   VIDEO ASSISTED THORACOSCOPY (VATS) W/TALC PLEUADESIS  2010 X2    Outpatient Medications Prior to Visit  Medication Sig Dispense Refill   aspirin EC 81 MG EC tablet Take  1 tablet (81 mg total) by mouth daily with breakfast. Swallow whole. 30 tablet 11   atorvastatin (LIPITOR) 40 MG tablet TAKE 1 TABLET BY MOUTH EVERY DAY 90 tablet 1   doxycycline (VIBRAMYCIN) 100 MG capsule Take 1 capsule (100 mg total) by mouth 2 (two) times daily for 14 days. 28 capsule 0   felodipine (PLENDIL) 10 MG 24 hr tablet TAKE 1 TABLET BY MOUTH EVERY DAY 90 tablet 1   HYDROcodone-acetaminophen (NORCO/VICODIN) 5-325 MG tablet Take 1 tablet by mouth every 6 (six) hours as needed for moderate pain. 45 tablet 0   lisinopril (ZESTRIL) 40 MG tablet Take 1 tablet (40 mg total) by mouth daily. 90 tablet 0   meloxicam (MOBIC) 15 MG tablet Take 1 tablet (15 mg total) by mouth daily.  30 tablet 2   methocarbamol (ROBAXIN) 500 MG tablet Take 1 tablet (500 mg total) by mouth every 6 (six) hours as needed for muscle spasms. 60 tablet 1   potassium chloride SA (KLOR-CON M) 20 MEQ tablet Take 1 tablet (20 mEq total) by mouth daily. 90 tablet 1   QUEtiapine (SEROQUEL) 400 MG tablet TAKE 1 TABLET BY MOUTH AT BEDTIME. 90 tablet 1   traZODone (DESYREL) 100 MG tablet TAKE 1 TABLET BY MOUTH EVERYDAY AT BEDTIME 90 tablet 1   VIIBRYD 20 MG TABS TAKE 1 TABLET BY MOUTH EVERY DAY 30 tablet 2   No facility-administered medications prior to visit.    Allergies  Allergen Reactions   Ambien [Zolpidem] Other (See Comments)    Pt took too much, acted out a bad dream/was throwing things, etc--ED visit (07/2017)    ROS As per HPI  PE:    03/29/2022    2:00 PM 03/29/2022    1:47 PM 02/26/2022   11:25 PM  Vitals with BMI  Height  5\' 7"    Weight  191 lbs 13 oz   BMI  29.93   Systolic 716 967 893  Diastolic 92 97 94  Pulse  61 78     Physical Exam  ***  LABS:  Last CBC Lab Results  Component Value Date   WBC 8.7 11/13/2021   HGB 13.7 11/13/2021   HCT 39.5 11/13/2021   MCV 98.5 11/13/2021   MCH 34.2 (H) 11/13/2021   RDW 12.3 11/13/2021   PLT 275 81/10/7508   Last metabolic panel Lab Results  Component Value Date   GLUCOSE 89 02/15/2022   NA 142 02/15/2022   K 3.3 (L) 02/15/2022   CL 105 02/15/2022   CO2 27 02/15/2022   BUN 14 02/15/2022   CREATININE 0.92 02/15/2022   GFRNONAA >60 11/13/2021   CALCIUM 8.7 02/15/2022   PHOS 2.5 05/12/2020   PROT 6.9 11/12/2021   ALBUMIN 3.9 11/12/2021   BILITOT 0.4 11/12/2021   ALKPHOS 96 11/12/2021   AST 18 11/12/2021   ALT 15 11/12/2021   ANIONGAP 6 11/13/2021    IMPRESSION AND PLAN:  No problem-specific Assessment & Plan notes found for this encounter.  Procedure: ingrown toenail removal-- Consent discussed and signed.   *** great toenail region prepped with betadine after using *** ml of *** for local anesthesia/digital  block, sterile technique utilized, nail splitters used to undermine nail in center, then clipped entire nail in half.  Used hemostats to pull each 1/2 of the nail from attachment to nail matrix.  Inspected periphery to make sure no nail particles remained.  Bleeding minimal.  No immediate complications.  Pt tolerated procedure well.  Wound care  discussed.  An After Visit Summary was printed and given to the patient.  FOLLOW UP: No follow-ups on file.  Signed:  Crissie Sickles, MD           04/15/2022

## 2022-04-16 ENCOUNTER — Telehealth: Payer: Self-pay

## 2022-04-16 NOTE — Telephone Encounter (Signed)
Pt would like to know if rx for flexeril can be sent in. He can no longer take methocarbamol due to stomach ache and loose stools. His last dose was this morning. Pt was last seen 03/29/22 regarding pain.   Please review and advise

## 2022-04-17 MED ORDER — CYCLOBENZAPRINE HCL 10 MG PO TABS: 10.0000 mg | ORAL_TABLET | Freq: Three times a day (TID) | ORAL | 1 refills | Status: DC | PRN

## 2022-04-17 NOTE — Telephone Encounter (Signed)
McGowen, Adrian Blackwater, MD  Team McGowen 1 hour ago (2:22 PM)    Please prescribe Flexeril 10 mg, one TID PRN, number 30, refill times one.

## 2022-04-17 NOTE — Telephone Encounter (Signed)
Rx sent to [place], [facility]. LM for pt regarding medication.

## 2022-04-17 NOTE — Addendum Note (Signed)
Addended by: Deveron Furlong D on: 04/17/2022 03:35 PM   Modules accepted: Orders

## 2022-04-18 ENCOUNTER — Telehealth: Payer: Self-pay

## 2022-04-18 NOTE — Telephone Encounter (Signed)
LM for pt to return call regarding medication 

## 2022-04-18 NOTE — Telephone Encounter (Signed)
   Telephone encounter was:  Unsuccessful.  04/18/2022 Name: [name] MRN: [id number] DOB: 18-Oct-1961  Unsuccessful outbound call made today to assist with:  Food Insecurity   A HIPAA compliant voice message was left requesting a return call.  Instructed patient to call back. Follow up call. Left a message to call back at earliest convenience. [place], Care Management  [phone] [address], [place], [place] [address] Phone: [phone] Email: [email or web address]

## 2022-04-19 NOTE — Telephone Encounter (Signed)
Patient returned office phone regarding medication.

## 2022-04-22 DIAGNOSIS — Z9181 History of falling: Secondary | ICD-10-CM | POA: Diagnosis not present

## 2022-04-22 DIAGNOSIS — M1990 Unspecified osteoarthritis, unspecified site: Secondary | ICD-10-CM | POA: Diagnosis not present

## 2022-04-22 DIAGNOSIS — J449 Chronic obstructive pulmonary disease, unspecified: Secondary | ICD-10-CM | POA: Diagnosis not present

## 2022-04-22 DIAGNOSIS — I69354 Hemiplegia and hemiparesis following cerebral infarction affecting left non-dominant side: Secondary | ICD-10-CM | POA: Diagnosis not present

## 2022-04-22 DIAGNOSIS — I69898 Other sequelae of other cerebrovascular disease: Secondary | ICD-10-CM | POA: Diagnosis not present

## 2022-04-23 ENCOUNTER — Telehealth: Payer: Self-pay

## 2022-04-25 ENCOUNTER — Telehealth: Payer: Self-pay

## 2022-04-25 ENCOUNTER — Ambulatory Visit: Payer: Medicare Other | Admitting: Family Medicine

## 2022-04-25 NOTE — Telephone Encounter (Signed)
LM for pt to return call. Pt was scheduled today at 1pm.

## 2022-04-25 NOTE — Progress Notes (Deleted)
OFFICE VISIT  04/25/2022  CC: No chief complaint on file.   HPI:    Patient is a 61 y.o. male who presents for right great toenail removal.  INTERIM HX: I saw him 6-23 for chronic right great toenail pain due to posttraumatic nail hypertrophy and very brittle nail plate. He has elected for removal of the nail.  Past Medical History:  Diagnosis Date   Alcoholism ([place])    Continues to abuse ETOH as of 01/2017; pt has poor insight into his problem.  Pt states that as of 04/2017 he has been 3 monthds sober.   Anxiety and depression    Arthritis    "hands, legs" (12/23/2017)   Chronic joint pain    COPD (chronic obstructive pulmonary disease) ([place])    Depression    Encounter for hemodialysis for acute renal failure ([place]) 04/2020   Poor PO intake/dehydration, with concurrent use of ACE-I, hctz, and NSAIDs. Emergency HD during hospitalization then d/c'd to SNF w sCr 1.3   Frequent headaches    "depends on BP" (12/23/2017)   History of kidney stones    Hypertension    Insomnia 08/26/2017   08/23/17 ED visit for adverse rxn to taking ambien, PLUS + marijuana on UDS her and in ED---NO FURTHER AMBIEN OR ANY OTHER CONTROLLED SUBSTANCES WILL BE PRESCRIBED BY ME--PM   Pneumothorax, right    Polysubstance abuse ([place])    Poor historian    UNRELIABLE HISTORIAN   Spastic hemiparesis ([place])    left arm (signif 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  Baclofen oral no help.  Botox inj started by Dr. Posey Pronto 07/2018--no help x 1.  Inj #2 done 10/2018.   Stroke ([place]) 2012   LUE residual deficit: flexion contracture and weakness of L hand.  Old bilateral cerebral infarcts involving the right frontal and parietal lobes as well as the left occipital lobe.  Baclofen and botox via neuro for L arm contracture.   Stroke [facility])    "3 mini strokes since 2012" (12/23/2017)   Subdural hematoma ([place]) 01/29/2017   Subacute right-sided frontal subdural hematoma: found on CT in [place] ED, transferred to [facility]  where this was non-operatively managed.  Pt admitted to the MDs at [facility] that he still drinks lots of whisky daily and they surmised that his frequent falls of late are due to this and frequent use of OTC sleep aids.  Near complete resolution on CT 03/27/18.   TIA (transient ischemic attack) 11/12/2021   presented with dizziness/vertigo, slurred speech, and worsening of his chronic L sided neuro deficits--CT and MR imaging neg acute, carotid duplex unchanged/no signif stenosis, echo bubble study normal   Venous insufficiency of both lower extremities     Past Surgical History:  Procedure Laterality Date   CHEST TUBE INSERTION     LUNG SURGERY     SHOULDER OPEN ROTATOR CUFF REPAIR Bilateral    Multiple rotator cuff surgeries on both sides.   TONSILLECTOMY     TRANSTHORACIC ECHOCARDIOGRAM  09/24/2019   EF 55-60%, grd I DD.   TRANSTHORACIC ECHOCARDIOGRAM  11/12/2021   Normal, no shunt   TYMPANOPLASTY Right    US CAROTIDS  09/24/2019   Minimal atherosclerotic plaque bilat; no flow obstruction   VIDEO ASSISTED THORACOSCOPY (VATS) W/TALC PLEUADESIS  2010 X2    Outpatient Medications Prior to Visit  Medication Sig Dispense Refill   aspirin EC 81 MG EC tablet Take 1 tablet (81 mg total) by mouth daily with breakfast.  Swallow whole. 30 tablet 11   atorvastatin (LIPITOR) 40 MG tablet TAKE 1 TABLET BY MOUTH EVERY DAY 90 tablet 1   cyclobenzaprine (FLEXERIL) 10 MG tablet Take 1 tablet (10 mg total) by mouth 3 (three) times daily as needed. 30 tablet 1   felodipine (PLENDIL) 10 MG 24 hr tablet TAKE 1 TABLET BY MOUTH EVERY DAY 90 tablet 1   HYDROcodone-acetaminophen (NORCO/VICODIN) 5-325 MG tablet Take 1 tablet by mouth every 6 (six) hours as needed for moderate pain. 45 tablet 0   lisinopril (ZESTRIL) 40 MG tablet Take 1 tablet (40 mg total) by mouth daily. 90 tablet 0   meloxicam (MOBIC) 15 MG tablet Take 1 tablet (15 mg total) by mouth daily. 30 tablet 2   potassium chloride SA (KLOR-CON M) 20 MEQ  tablet Take 1 tablet (20 mEq total) by mouth daily. 90 tablet 1   QUEtiapine (SEROQUEL) 400 MG tablet TAKE 1 TABLET BY MOUTH AT BEDTIME. 90 tablet 1   traZODone (DESYREL) 100 MG tablet TAKE 1 TABLET BY MOUTH EVERYDAY AT BEDTIME 90 tablet 1   VIIBRYD 20 MG TABS TAKE 1 TABLET BY MOUTH EVERY DAY 30 tablet 2   No facility-administered medications prior to visit.    Allergies  Allergen Reactions   Ambien [Zolpidem] Other (See Comments)    Pt took too much, acted out a bad dream/was throwing things, etc--ED visit (07/2017)    ROS As per HPI  PE:    03/29/2022    2:00 PM 03/29/2022    1:47 PM 02/26/2022   11:25 PM  Vitals with BMI  Height  5\' 7"    Weight  191 lbs 13 oz   BMI  27.03   Systolic 500 938 182  Diastolic 92 97 94  Pulse  61 78    Physical Exam  ***  LABS:  Last CBC Lab Results  Component Value Date   WBC 8.7 11/13/2021   HGB 13.7 11/13/2021   HCT 39.5 11/13/2021   MCV 98.5 11/13/2021   MCH 34.2 (H) 11/13/2021   RDW 12.3 11/13/2021   PLT 275 99/37/1696   Last metabolic panel Lab Results  Component Value Date   GLUCOSE 89 02/15/2022   NA 142 02/15/2022   K 3.3 (L) 02/15/2022   CL 105 02/15/2022   CO2 27 02/15/2022   BUN 14 02/15/2022   CREATININE 0.92 02/15/2022   GFRNONAA >60 11/13/2021   CALCIUM 8.7 02/15/2022   PHOS 2.5 05/12/2020   PROT 6.9 11/12/2021   ALBUMIN 3.9 11/12/2021   BILITOT 0.4 11/12/2021   ALKPHOS 96 11/12/2021   AST 18 11/12/2021   ALT 15 11/12/2021   ANIONGAP 6 11/13/2021    IMPRESSION AND PLAN:  No problem-specific Assessment & Plan notes found for this encounter.   An After Visit Summary was printed and given to the patient.  FOLLOW UP: No follow-ups on file.  Signed:  Crissie Sickles, MD           04/25/2022

## 2022-05-22 DIAGNOSIS — M1990 Unspecified osteoarthritis, unspecified site: Secondary | ICD-10-CM | POA: Diagnosis not present

## 2022-05-22 DIAGNOSIS — Z9181 History of falling: Secondary | ICD-10-CM | POA: Diagnosis not present

## 2022-05-22 DIAGNOSIS — I69898 Other sequelae of other cerebrovascular disease: Secondary | ICD-10-CM | POA: Diagnosis not present

## 2022-05-22 DIAGNOSIS — I69354 Hemiplegia and hemiparesis following cerebral infarction affecting left non-dominant side: Secondary | ICD-10-CM | POA: Diagnosis not present

## 2022-05-22 DIAGNOSIS — J449 Chronic obstructive pulmonary disease, unspecified: Secondary | ICD-10-CM | POA: Diagnosis not present

## 2022-05-23 ENCOUNTER — Ambulatory Visit (INDEPENDENT_AMBULATORY_CARE_PROVIDER_SITE_OTHER): Payer: Medicare Other | Admitting: Family Medicine

## 2022-05-23 ENCOUNTER — Encounter: Payer: Self-pay | Admitting: Family Medicine

## 2022-05-23 VITALS — BP 125/79 | HR 65 | Temp 98.5°F | Ht 67.0 in | Wt 186.8 lb

## 2022-05-23 DIAGNOSIS — L600 Ingrowing nail: Secondary | ICD-10-CM

## 2022-05-23 NOTE — Progress Notes (Signed)
OFFICE VISIT  05/23/2022  CC:  Chief Complaint  Patient presents with  . Procedure    Toenail removal; R great big toe   Patient is a 61 y.o. male who presents for toenail removal.   HPI: [name] has had a couple years hx of gradual enlargement and discomfort of R great toenail.  PMP AWARE reviewed today: most recent rx for Vicodin was filled 03/29/2022, #45, rx by me. No red flags.   Past Medical History:  Diagnosis Date  . Alcoholism ([place])    Continues to abuse ETOH as of 01/2017; pt has poor insight into his problem.  Pt states that as of 04/2017 he has been 3 monthds sober.  . Anxiety and depression   . Arthritis    "hands, legs" (12/23/2017)  . Chronic joint pain   . COPD (chronic obstructive pulmonary disease) ([place])   . Depression   . Encounter for hemodialysis for acute renal failure ([place]) 04/2020   Poor PO intake/dehydration, with concurrent use of ACE-I, hctz, and NSAIDs. Emergency HD during hospitalization then d/c'd to SNF w sCr 1.3  . Frequent headaches    "depends on BP" (12/23/2017)  . History of kidney stones   . Hypertension   . Insomnia 08/26/2017   08/23/17 ED visit for adverse rxn to taking ambien, PLUS + marijuana on UDS her and in ED---NO FURTHER AMBIEN OR ANY OTHER CONTROLLED SUBSTANCES WILL BE PRESCRIBED BY ME--PM  . Pneumothorax, right   . Polysubstance abuse ([place])   . Poor historian    UNRELIABLE HISTORIAN  . 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  Baclofen oral no help.  Botox inj started by Dr. Posey Pronto 07/2018--no help x 1.  Inj #2 done 10/2018.  Marland Kitchen Stroke ([place]) 2012   LUE residual deficit: flexion contracture and weakness of L hand.  Old bilateral cerebral infarcts involving the right frontal and parietal lobes as well as the left occipital lobe.  Baclofen and botox via neuro for L arm contracture.  . Stroke [facility])    "3 mini strokes since 2012" (12/23/2017)  . Subdural hematoma ([place]) 01/29/2017    Subacute right-sided frontal subdural hematoma: found on CT in [place] ED, transferred to [facility] where this was non-operatively managed.  Pt admitted to the MDs at [facility] that he still drinks lots of whisky daily and they surmised that his frequent falls of late are due to this and frequent use of OTC sleep aids.  Near complete resolution on CT 03/27/18.  Marland Kitchen TIA (transient ischemic attack) 11/12/2021   presented with dizziness/vertigo, slurred speech, and worsening of his chronic L sided neuro deficits--CT and MR imaging neg acute, carotid duplex unchanged/no signif stenosis, echo bubble study normal  . Venous insufficiency of both lower extremities     Past Surgical History:  Procedure Laterality Date  . CHEST TUBE INSERTION    . LUNG SURGERY    . SHOULDER OPEN ROTATOR CUFF REPAIR Bilateral    Multiple rotator cuff surgeries on both sides.  . TONSILLECTOMY    . TRANSTHORACIC ECHOCARDIOGRAM  09/24/2019   EF 55-60%, grd I DD.  Marland Kitchen TRANSTHORACIC ECHOCARDIOGRAM  11/12/2021   Normal, no shunt  . TYMPANOPLASTY Right   . US CAROTIDS  09/24/2019   Minimal atherosclerotic plaque bilat; no flow obstruction  . VIDEO ASSISTED THORACOSCOPY (VATS) W/TALC PLEUADESIS  2010 X2    Outpatient Medications Prior to Visit  Medication Sig Dispense Refill  .  aspirin EC 81 MG EC tablet Take 1 tablet (81 mg total) by mouth daily with breakfast. Swallow whole. 30 tablet 11  . atorvastatin (LIPITOR) 40 MG tablet TAKE 1 TABLET BY MOUTH EVERY DAY 90 tablet 1  . cyclobenzaprine (FLEXERIL) 10 MG tablet Take 1 tablet (10 mg total) by mouth 3 (three) times daily as needed. 30 tablet 1  . felodipine (PLENDIL) 10 MG 24 hr tablet TAKE 1 TABLET BY MOUTH EVERY DAY 90 tablet 1  . HYDROcodone-acetaminophen (NORCO/VICODIN) 5-325 MG tablet Take 1 tablet by mouth every 6 (six) hours as needed for moderate pain. 45 tablet 0  . lisinopril (ZESTRIL) 40 MG tablet Take 1 tablet (40 mg total) by mouth daily. 90 tablet 0  . meloxicam (MOBIC) 15  MG tablet Take 1 tablet (15 mg total) by mouth daily. 30 tablet 2  . potassium chloride SA (KLOR-CON M) 20 MEQ tablet Take 1 tablet (20 mEq total) by mouth daily. 90 tablet 1  . QUEtiapine (SEROQUEL) 400 MG tablet TAKE 1 TABLET BY MOUTH AT BEDTIME. 90 tablet 1  . traZODone (DESYREL) 100 MG tablet TAKE 1 TABLET BY MOUTH EVERYDAY AT BEDTIME 90 tablet 1  . VIIBRYD 20 MG TABS TAKE 1 TABLET BY MOUTH EVERY DAY 30 tablet 2   No facility-administered medications prior to visit.    Allergies  Allergen Reactions  . Ambien [Zolpidem] Other (See Comments)    Pt took too much, acted out a bad dream/was throwing things, etc--ED visit (07/2017)    ROS As per HPI  PE:    05/23/2022    1:30 PM 03/29/2022    2:00 PM 03/29/2022    1:47 PM  Vitals with BMI  Height 5\' 7"   5\' 7"   Weight 186 lbs 13 oz  191 lbs 13 oz  BMI 76.22  63.33  Systolic 545 625 638  Diastolic 79 92 97  Pulse 65  61     Physical Exam  R great toe nailplate hypertrophic and darkly pigmented and ingrowing, without erythema or swelling of periungual tissue.  Non-tender.  LABS:  Last CBC Lab Results  Component Value Date   WBC 8.7 11/13/2021   HGB 13.7 11/13/2021   HCT 39.5 11/13/2021   MCV 98.5 11/13/2021   MCH 34.2 (H) 11/13/2021   RDW 12.3 11/13/2021   PLT 275 93/73/4287   Last metabolic panel Lab Results  Component Value Date   GLUCOSE 89 02/15/2022   NA 142 02/15/2022   K 3.3 (L) 02/15/2022   CL 105 02/15/2022   CO2 27 02/15/2022   BUN 14 02/15/2022   CREATININE 0.92 02/15/2022   GFRNONAA >60 11/13/2021   CALCIUM 8.7 02/15/2022   PHOS 2.5 05/12/2020   PROT 6.9 11/12/2021   ALBUMIN 3.9 11/12/2021   BILITOT 0.4 11/12/2021   ALKPHOS 96 11/12/2021   AST 18 11/12/2021   ALT 15 11/12/2021   ANIONGAP 6 11/13/2021   Last hemoglobin A1c Lab Results  Component Value Date   HGBA1C 5.4 11/13/2021   IMPRESSION AND PLAN:  No problem-specific Assessment & Plan notes found for this encounter.  Right great  toenail hypertrophic, brittle, and ingrowing. Pt desires nail removal.  PROCEDURE: consent obtained. After local anesthesia with 4 cc of 2% lidocaine without epinephrine, I used sterile technique with nail splitters and hemostats to remove nail.  No immediate complications.  Pt tolerated procedure well.  An After Visit Summary was printed and given to the patient.  FOLLOW UP: No follow-ups on file.  Signed:  Crissie Sickles, MD           05/23/2022

## 2022-06-22 DIAGNOSIS — J449 Chronic obstructive pulmonary disease, unspecified: Secondary | ICD-10-CM | POA: Diagnosis not present

## 2022-06-22 DIAGNOSIS — M1990 Unspecified osteoarthritis, unspecified site: Secondary | ICD-10-CM | POA: Diagnosis not present

## 2022-06-22 DIAGNOSIS — Z9181 History of falling: Secondary | ICD-10-CM | POA: Diagnosis not present

## 2022-06-22 DIAGNOSIS — I69354 Hemiplegia and hemiparesis following cerebral infarction affecting left non-dominant side: Secondary | ICD-10-CM | POA: Diagnosis not present

## 2022-06-22 DIAGNOSIS — I69898 Other sequelae of other cerebrovascular disease: Secondary | ICD-10-CM | POA: Diagnosis not present

## 2022-06-27 ENCOUNTER — Other Ambulatory Visit: Payer: Self-pay | Admitting: Family Medicine

## 2022-07-10 ENCOUNTER — Ambulatory Visit (INDEPENDENT_AMBULATORY_CARE_PROVIDER_SITE_OTHER): Payer: Medicare Other | Admitting: Family Medicine

## 2022-07-10 ENCOUNTER — Encounter: Payer: Self-pay | Admitting: Family Medicine

## 2022-07-10 VITALS — BP 96/65 | HR 87 | Temp 98.0°F | Ht 67.0 in | Wt 180.8 lb

## 2022-07-10 DIAGNOSIS — L600 Ingrowing nail: Secondary | ICD-10-CM | POA: Diagnosis not present

## 2022-07-10 DIAGNOSIS — I10 Essential (primary) hypertension: Secondary | ICD-10-CM | POA: Diagnosis not present

## 2022-07-10 DIAGNOSIS — M159 Polyosteoarthritis, unspecified: Secondary | ICD-10-CM

## 2022-07-10 MED ORDER — MELOXICAM 15 MG PO TABS: 15.0000 mg | ORAL_TABLET | Freq: Every day | ORAL | 2 refills | Status: DC

## 2022-07-10 MED ORDER — MELOXICAM 15 MG PO TABS: 15.0000 mg | ORAL_TABLET | Freq: Every day | ORAL | 2 refills | Status: AC

## 2022-07-10 MED ORDER — LISINOPRIL 40 MG PO TABS: 40.0000 mg | ORAL_TABLET | Freq: Every day | ORAL | 1 refills | Status: AC

## 2022-07-10 NOTE — Progress Notes (Signed)
OFFICE VISIT  07/10/2022  CC:  Chief Complaint  Patient presents with   Follow-up    R great big toe, pt states toe is not healing properly.    Patient is a 61 y.o. male who presents for check of right toe, refill of the med for arthritis, letter of clearance for travel  INTERIM HX: I removed his right great toenail about 6 weeks ago. Pain, swelling, redness, or discharge. Thickened skin in the nail bed is present.  He is going to travel to [place] at the end of this month to visit his girlfriend. He states he will be going to the health department to get yellow fever vaccine.  He feels well: No recent fevers, cough, nasal congestion, diarrhea, or rash.  He does have chronic hips and knees pain.  He says meloxicam has been helpful in the recent past when prescribed by the emergency physician several months ago.  He requests refill. Hydrocodone is on his medication list but he no longer takes this.  He states that he does take Viibryd, trazodone, and quetiapine.  ROS as above, plus--> no CP, no SOB, no wheezing,  no dizziness, no HAs, no rashes, no melena/hematochezia.  No polyuria or polydipsia.  No myalgias . No new focal weakness, paresthesias, or tremors.  No acute vision or hearing abnormalities.  No dysuria or unusual/new urinary urgency or frequency.  No recent changes in lower legs. No n/v/d or abd pain.  No palpitations.     PMP AWARE reviewed today: most recent rx for vicodin was filled 03/29/22, # 58, rx by me. No red flags.  Past Medical History:  Diagnosis Date   Alcoholism ([place])    Continues to abuse ETOH as of 01/2017; pt has poor insight into his problem.  Pt states that as of 04/2017 he has been 3 monthds sober.   Anxiety and depression    Arthritis    "hands, legs" (12/23/2017)   Chronic joint pain    COPD (chronic obstructive pulmonary disease) ([place])    Depression    Encounter for hemodialysis for acute renal failure ([place]) 04/2020   Poor PO intake/dehydration,  with concurrent use of ACE-I, hctz, and NSAIDs. Emergency HD during hospitalization then d/c'd to SNF w sCr 1.3   Frequent headaches    "depends on BP" (12/23/2017)   History of kidney stones    Hypertension    Insomnia 08/26/2017   08/23/17 ED visit for adverse rxn to taking ambien, PLUS + marijuana on UDS her and in ED---NO FURTHER AMBIEN OR ANY OTHER CONTROLLED SUBSTANCES WILL BE PRESCRIBED BY ME--PM   Pneumothorax, right    Polysubstance abuse ([place])    Poor historian    UNRELIABLE HISTORIAN   Spastic hemiparesis ([place])    left arm (signif 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  Baclofen oral no help.  Botox inj started by Dr. Posey Pronto 07/2018--no help x 1.  Inj #2 done 10/2018.   Stroke ([place]) 2012   LUE residual deficit: flexion contracture and weakness of L hand.  Old bilateral cerebral infarcts involving the right frontal and parietal lobes as well as the left occipital lobe.  Baclofen and botox via neuro for L arm contracture.   Stroke [facility])    "3 mini strokes since 2012" (12/23/2017)   Subdural hematoma ([place]) 01/29/2017   Subacute right-sided frontal subdural hematoma: found on CT in [place] ED, transferred to [facility] where this was non-operatively managed.  Pt admitted to the MDs at  [facility] that he still drinks lots of whisky daily and they surmised that his frequent falls of late are due to this and frequent use of OTC sleep aids.  Near complete resolution on CT 03/27/18.   TIA (transient ischemic attack) 11/12/2021   presented with dizziness/vertigo, slurred speech, and worsening of his chronic L sided neuro deficits--CT and MR imaging neg acute, carotid duplex unchanged/no signif stenosis, echo bubble study normal   Venous insufficiency of both lower extremities     Past Surgical History:  Procedure Laterality Date   CHEST TUBE INSERTION     LUNG SURGERY     SHOULDER OPEN ROTATOR CUFF REPAIR Bilateral    Multiple rotator cuff surgeries on both sides.   TONSILLECTOMY      TRANSTHORACIC ECHOCARDIOGRAM  09/24/2019   EF 55-60%, grd I DD.   TRANSTHORACIC ECHOCARDIOGRAM  11/12/2021   Normal, no shunt   TYMPANOPLASTY Right    US CAROTIDS  09/24/2019   Minimal atherosclerotic plaque bilat; no flow obstruction   VIDEO ASSISTED THORACOSCOPY (VATS) W/TALC PLEUADESIS  2010 X2    Outpatient Medications Prior to Visit  Medication Sig Dispense Refill   aspirin EC 81 MG EC tablet Take 1 tablet (81 mg total) by mouth daily with breakfast. Swallow whole. 30 tablet 11   atorvastatin (LIPITOR) 40 MG tablet TAKE 1 TABLET BY MOUTH EVERY DAY 90 tablet 1   cyclobenzaprine (FLEXERIL) 10 MG tablet Take 1 tablet (10 mg total) by mouth 3 (three) times daily as needed. 30 tablet 1   felodipine (PLENDIL) 10 MG 24 hr tablet TAKE 1 TABLET BY MOUTH EVERY DAY 90 tablet 1   KLOR-CON M20 20 MEQ tablet TAKE 1 TABLET BY MOUTH EVERY DAY 90 tablet 0   QUEtiapine (SEROQUEL) 400 MG tablet TAKE 1 TABLET BY MOUTH AT BEDTIME. 90 tablet 1   traZODone (DESYREL) 100 MG tablet TAKE 1 TABLET BY MOUTH EVERYDAY AT BEDTIME 90 tablet 1   VIIBRYD 20 MG TABS TAKE 1 TABLET BY MOUTH EVERY DAY 30 tablet 2   HYDROcodone-acetaminophen (NORCO/VICODIN) 5-325 MG tablet Take 1 tablet by mouth every 6 (six) hours as needed for moderate pain. 45 tablet 0   lisinopril (ZESTRIL) 40 MG tablet Take 1 tablet (40 mg total) by mouth daily. 90 tablet 0   meloxicam (MOBIC) 15 MG tablet Take 1 tablet (15 mg total) by mouth daily. 30 tablet 2   No facility-administered medications prior to visit.    Allergies  Allergen Reactions   Ambien [Zolpidem] Other (See Comments)    Pt took too much, acted out a bad dream/was throwing things, etc--ED visit (07/2017)    ROS As per HPI  PE:    07/10/2022    2:44 PM 05/23/2022    1:30 PM 03/29/2022    2:00 PM  Vitals with BMI  Height 5\' 7"  5\' 7"    Weight 180 lbs 13 oz 186 lbs 13 oz   BMI 19.62 22.97   Systolic 96 989 211  Diastolic 65 79 92  Pulse 87 65      Physical  Exam  Gen: Alert, well appearing.  Patient is oriented to person, place, time, and situation. AFFECT: pleasant, lucid thought and speech. CV: RRR, no m/r/g.   LUNGS: CTA bilat, nonlabored resps, good aeration in all lung fields. Extremities: No edema. Right great toe: He has some hyperkeratotic and dehydrated-appearing changes to the nailbed.  I do not see any new nail growth.  No erythema, swelling, exudate, or tenderness.  LABS:  Last CBC Lab Results  Component Value Date   WBC 8.7 11/13/2021   HGB 13.7 11/13/2021   HCT 39.5 11/13/2021   MCV 98.5 11/13/2021   MCH 34.2 (H) 11/13/2021   RDW 12.3 11/13/2021   PLT 275 22/11/5425   Last metabolic panel Lab Results  Component Value Date   GLUCOSE 89 02/15/2022   NA 142 02/15/2022   K 3.3 (L) 02/15/2022   CL 105 02/15/2022   CO2 27 02/15/2022   BUN 14 02/15/2022   CREATININE 0.92 02/15/2022   GFRNONAA >60 11/13/2021   CALCIUM 8.7 02/15/2022   PHOS 2.5 05/12/2020   PROT 6.9 11/12/2021   ALBUMIN 3.9 11/12/2021   BILITOT 0.4 11/12/2021   ALKPHOS 96 11/12/2021   AST 18 11/12/2021   ALT 15 11/12/2021   ANIONGAP 6 11/13/2021   Last lipids Lab Results  Component Value Date   CHOL 133 11/13/2021   HDL 60 11/13/2021   LDLCALC 63 11/13/2021   TRIG 51 11/13/2021   CHOLHDL 2.2 11/13/2021   Last hemoglobin A1c Lab Results  Component Value Date   HGBA1C 5.4 11/13/2021   Last thyroid functions Lab Results  Component Value Date   TSH 0.778 05/09/2020   IMPRESSION AND PLAN:  #1 right nailbed hypertrophic skin changes.  He is status post right toenail removal 6 weeks ago.  T I reassured him that these changes are to be expected given his past history of chronically ingrown nail with hypertrophic nail changes.  #2 chronic arthritis pain--hips and knees. Patient responded to meloxicam in the past so I refilled this, 50 mg once daily as needed.  Monitor renal function today.  #3 hypertension, well controlled on lisinopril  40 mg a day and felodipine 10 mg a day. Electrolytes and creatinine today. An After Visit Summary was printed and given to the patient.  I wrote a letter today stating he is okay for international travel.  He has no acute illnesses and his chronic illnesses are optimally managed.   Flu vaccine given today. He is going to get yellow fever vaccine at the health department.  FOLLOW UP: Return in about 6 months (around 01/08/2023) for routine chronic illness f/u.  Signed:  Crissie Sickles, MD           07/10/2022

## 2022-07-11 ENCOUNTER — Telehealth: Payer: Self-pay | Admitting: Family Medicine

## 2022-07-11 LAB — BASIC METABOLIC PANEL
BUN: 11 mg/dL (ref 6–23)
CO2: 28 mEq/L (ref 19–32)
Calcium: 9.7 mg/dL (ref 8.4–10.5)
Chloride: 102 mEq/L (ref 96–112)
Creatinine, Ser: 1 mg/dL (ref 0.40–1.50)
GFR: 80.22 mL/min (ref >60.00)
Glucose, Bld: 105 mg/dL — ABNORMAL HIGH (ref 70–99)
Potassium: 3.7 mEq/L (ref 3.5–5.1)
Sodium: 140 mEq/L (ref 135–145)

## 2022-07-11 NOTE — Telephone Encounter (Signed)
Pls call pt and notify him that I checked the CDC website information again about [place] and it states that typhoid vaccine and malaria preventative medication are recommended for travelers but not required. If he wants the typhoid vaccine he can get it at the HD when he gets the yellow fever vaccine. If he wants malaria preventative med then let me know and I'll send it in to pharmacy.

## 2022-07-11 NOTE — Telephone Encounter (Signed)
LM for pt to return call regarding vaccine information.

## 2022-07-12 ENCOUNTER — Telehealth: Payer: Self-pay | Admitting: *Deleted

## 2022-07-12 NOTE — Telephone Encounter (Addendum)
LM for pt to return call regarding vaccine information. (See result note as well)

## 2022-07-12 NOTE — Patient Outreach (Signed)
  Care Coordination   07/12/2022 Name: [name] MRN: [id number] DOB: September 26, 1961   Care Coordination Outreach Attempts:  An unsuccessful telephone outreach was attempted today to offer the patient information about available care coordination services as a benefit of their health plan.   Follow Up Plan:  Additional outreach attempts will be made to offer the patient care coordination information and services.   Encounter Outcome:  No Answer  Care Coordination Interventions Activated:  No   Care Coordination Interventions:  No, not indicated    Raina Mina, RN Care Management Coordinator [place] Office [phone]

## 2022-07-15 NOTE — Telephone Encounter (Signed)
LM for pt to return call regarding vaccine information. (See result note as well)

## 2022-07-16 NOTE — Telephone Encounter (Signed)
If patient returns call, please advise of results and vaccine information.

## 2022-07-23 DIAGNOSIS — Z9181 History of falling: Secondary | ICD-10-CM | POA: Diagnosis not present

## 2022-07-23 DIAGNOSIS — I69898 Other sequelae of other cerebrovascular disease: Secondary | ICD-10-CM | POA: Diagnosis not present

## 2022-07-23 DIAGNOSIS — J449 Chronic obstructive pulmonary disease, unspecified: Secondary | ICD-10-CM | POA: Diagnosis not present

## 2022-07-23 DIAGNOSIS — M1990 Unspecified osteoarthritis, unspecified site: Secondary | ICD-10-CM | POA: Diagnosis not present

## 2022-07-23 DIAGNOSIS — I69354 Hemiplegia and hemiparesis following cerebral infarction affecting left non-dominant side: Secondary | ICD-10-CM | POA: Diagnosis not present

## 2022-07-30 ENCOUNTER — Telehealth: Payer: Self-pay

## 2022-07-30 NOTE — Patient Outreach (Signed)
  Care Coordination   07/30/2022 Name: [name] MRN: [id number] DOB: [birth year]   Care Coordination Outreach Attempts:  A second unsuccessful outreach was attempted today to offer the patient with information about available care coordination services as a benefit of their health plan.     Follow Up Plan:  Additional outreach attempts will be made to offer the patient care coordination information and services.   Encounter Outcome:  No Answer  Care Coordination Interventions Activated:  No   Care Coordination Interventions:  No, not indicated    Peter Garter RN, BSN,CCM, [place] [facility] [phone]

## 2022-07-31 ENCOUNTER — Emergency Department
Admission: EM | Admit: 2022-07-31 | Discharge: 2022-07-31 | Disposition: A | Discharge status: Home / Self Care | Payer: Medicare Other | Attending: Emergency Medicine | Admitting: Emergency Medicine

## 2022-07-31 ENCOUNTER — Other Ambulatory Visit: Payer: Self-pay

## 2022-07-31 ENCOUNTER — Emergency Department: Payer: Medicare Other

## 2022-07-31 DIAGNOSIS — K838 Other specified diseases of biliary tract: Secondary | ICD-10-CM | POA: Diagnosis not present

## 2022-07-31 DIAGNOSIS — K5900 Constipation, unspecified: Secondary | ICD-10-CM | POA: Insufficient documentation

## 2022-07-31 DIAGNOSIS — I10 Essential (primary) hypertension: Secondary | ICD-10-CM | POA: Diagnosis not present

## 2022-07-31 DIAGNOSIS — R1084 Generalized abdominal pain: Secondary | ICD-10-CM

## 2022-07-31 DIAGNOSIS — J449 Chronic obstructive pulmonary disease, unspecified: Secondary | ICD-10-CM | POA: Diagnosis not present

## 2022-07-31 DIAGNOSIS — R1013 Epigastric pain: Secondary | ICD-10-CM | POA: Diagnosis present

## 2022-07-31 DIAGNOSIS — I700 Atherosclerosis of aorta: Secondary | ICD-10-CM | POA: Diagnosis not present

## 2022-07-31 DIAGNOSIS — R1110 Vomiting, unspecified: Secondary | ICD-10-CM | POA: Diagnosis not present

## 2022-07-31 DIAGNOSIS — R062 Wheezing: Secondary | ICD-10-CM | POA: Insufficient documentation

## 2022-07-31 DIAGNOSIS — R112 Nausea with vomiting, unspecified: Secondary | ICD-10-CM | POA: Insufficient documentation

## 2022-07-31 DIAGNOSIS — R109 Unspecified abdominal pain: Secondary | ICD-10-CM | POA: Diagnosis not present

## 2022-07-31 LAB — URINALYSIS, ROUTINE W REFLEX MICROSCOPIC
Bilirubin Urine: NEGATIVE
Glucose, UA: NEGATIVE mg/dL
Hgb urine dipstick: NEGATIVE
Ketones, ur: NEGATIVE mg/dL
Leukocytes,Ua: NEGATIVE
Nitrite: NEGATIVE
Protein, ur: NEGATIVE mg/dL
Specific Gravity, Urine: >1.046 — ABNORMAL HIGH (ref 1.005–1.030)
pH: 5 (ref 5.0–8.0)

## 2022-07-31 LAB — TROPONIN I (HIGH SENSITIVITY)
Troponin I (High Sensitivity): 8 ng/L (ref <18)
Troponin I (High Sensitivity): 7 ng/L (ref <18)

## 2022-07-31 LAB — COMPREHENSIVE METABOLIC PANEL
ALT: 26 U/L (ref 0–44)
AST: 21 U/L (ref 15–41)
Albumin: 3.4 g/dL — ABNORMAL LOW (ref 3.5–5.0)
Alkaline Phosphatase: 125 U/L (ref 38–126)
Anion gap: 12 (ref 5–15)
BUN: 34 mg/dL — ABNORMAL HIGH (ref 8–23)
CO2: 22 mmol/L (ref 22–32)
Calcium: 8.8 mg/dL — ABNORMAL LOW (ref 8.9–10.3)
Chloride: 107 mmol/L (ref 98–111)
Creatinine, Ser: 1.14 mg/dL (ref 0.61–1.24)
GFR, Estimated: >60 mL/min (ref >60)
Glucose, Bld: 110 mg/dL — ABNORMAL HIGH (ref 70–99)
Potassium: 3.6 mmol/L (ref 3.5–5.1)
Sodium: 141 mmol/L (ref 135–145)
Total Bilirubin: 0.9 mg/dL (ref 0.3–1.2)
Total Protein: 6.9 g/dL (ref 6.5–8.1)

## 2022-07-31 LAB — CBC
HCT: 36.1 % — ABNORMAL LOW (ref 39.0–52.0)
Hemoglobin: 11.9 g/dL — ABNORMAL LOW (ref 13.0–17.0)
MCH: 32.4 pg (ref 26.0–34.0)
MCHC: 33 g/dL (ref 30.0–36.0)
MCV: 98.4 fL (ref 80.0–100.0)
Platelets: 349 10*3/uL (ref 150–400)
RBC: 3.67 MIL/uL — ABNORMAL LOW (ref 4.22–5.81)
RDW: 13.4 % (ref 11.5–15.5)
WBC: 11.3 10*3/uL — ABNORMAL HIGH (ref 4.0–10.5)
nRBC: 0 % (ref 0.0–0.2)

## 2022-07-31 LAB — LIPASE, BLOOD: Lipase: 24 U/L (ref 11–51)

## 2022-07-31 MED ORDER — SODIUM CHLORIDE 0.9 % IV BOLUS
1000.0000 mL | Freq: Once | INTRAVENOUS | Status: AC
  Administered 2022-07-31: 1000 mL via INTRAVENOUS

## 2022-07-31 MED ORDER — ONDANSETRON HCL 4 MG PO TABS: 4.0000 mg | ORAL_TABLET | Freq: Every day | ORAL | 1 refills | Status: AC | PRN

## 2022-07-31 MED ORDER — ALUM & MAG HYDROXIDE-SIMETH 200-200-20 MG/5ML PO SUSP
30.0000 mL | Freq: Once | ORAL | Status: AC
  Administered 2022-07-31: 30 mL via ORAL
  Filled 2022-07-31: qty 30

## 2022-07-31 MED ORDER — IOHEXOL 300 MG/ML  SOLN
100.0000 mL | Freq: Once | INTRAMUSCULAR | Status: AC | PRN
  Administered 2022-07-31: 100 mL via INTRAVENOUS

## 2022-07-31 MED ORDER — MORPHINE SULFATE (PF) 4 MG/ML IV SOLN
4.0000 mg | Freq: Once | INTRAVENOUS | Status: AC
  Administered 2022-07-31: 4 mg via INTRAVENOUS
  Filled 2022-07-31: qty 1

## 2022-07-31 MED ORDER — FAMOTIDINE 20 MG PO TABS
20.0000 mg | ORAL_TABLET | Freq: Once | ORAL | Status: AC
  Administered 2022-07-31: 20 mg via ORAL
  Filled 2022-07-31: qty 1

## 2022-07-31 MED ORDER — ONDANSETRON HCL 4 MG/2ML IJ SOLN
4.0000 mg | Freq: Once | INTRAMUSCULAR | Status: AC
  Administered 2022-07-31: 4 mg via INTRAVENOUS
  Filled 2022-07-31: qty 2

## 2022-07-31 MED ORDER — LIDOCAINE VISCOUS HCL 2 % MT SOLN
15.0000 mL | Freq: Once | OROMUCOSAL | Status: AC
  Administered 2022-07-31: 15 mL via ORAL
  Filled 2022-07-31: qty 15

## 2022-07-31 MED ORDER — FAMOTIDINE 20 MG PO TABS: 20.0000 mg | ORAL_TABLET | Freq: Every day | ORAL | 0 refills | Status: AC

## 2022-07-31 NOTE — Discharge Instructions (Signed)
Take pepcid as prescribed. Take acetaminophen 650 mg  6 hours for pain.  Take with food.  Thank you for choosing [place] for your health care today!  Please see your primary doctor this week for a follow up appointment.   If you do not have a primary doctor call the following clinics to establish care:  If you have insurance:  [facility] [phone] [place] [address]   [facility]  [phone] [address] [place] [place] [address]   If you do not have insurance:  [facility]  [phone] [address], [place] [address]  Sometimes, in the early stages of certain disease courses it is difficult to detect in the emergency department evaluation -- so, it is important that you continue to monitor your symptoms and call your doctor right away or return to the emergency department if you develop any new or worsening symptoms.  It was my pleasure to care for you today.   [place] Jacelyn Grip, MD

## 2022-07-31 NOTE — ED Provider Notes (Signed)
[facility] Provider Note    Event Date/Time   First MD Initiated Contact with Patient 07/31/22 1722     (approximate)   History   Emesis   HPI  [name] is a 61 y.o. male   Past medical history of chronic back pain, stroke with left-sided deficits, COPD, hypertension, polysubstance use, who presents with several days of nausea and vomiting and epigastric pain radiating to his back.  No fever or chills.  He has had constipation, last stool was this morning hard without blood.  No no alcohol use.  No NSAID use.  Nonbloody emesis.  Worse after eating.  nonExertional.  History was obtained via patient review of external medical notes      Physical Exam   Triage Vital Signs: ED Triage Vitals  Enc Vitals Group     BP 07/31/22 1721 90/64     Pulse Rate 07/31/22 1721 81     Resp 07/31/22 1721 18     Temp 07/31/22 1721 98.8 F (37.1 C)     Temp Source 07/31/22 1721 Oral     SpO2 07/31/22 1721 96 %     Weight 07/31/22 1722 185 lb (83.9 kg)     Height 07/31/22 1722 5\' 7"  (1.702 m)     Head Circumference --      Peak Flow --      Pain Score 07/31/22 1722 9     Pain Loc --      Pain Edu? --      Excl. in [place]? --     Most recent vital signs: Vitals:   07/31/22 1730 07/31/22 2100  BP: 90/65 114/86  Pulse: 78 83  Resp: 18   Temp:    SpO2:      General: Awake, no distress.  CV:  Dry mucous membranes. Resp:  Normal effort.  Scant wheeze. Abd:  No distention.  Tenderness to the epigastrium and right upper quadrant, left upper quadrant without rigidity or guarding.  Lower quadrants nontender. Other:  Blood pressure 90/60 normal heart rate no hypoxia no fever   ED Results / Procedures / Treatments   Labs (all labs ordered are listed, but only abnormal results are displayed) Labs Reviewed  COMPREHENSIVE METABOLIC PANEL - Abnormal; Notable for the following components:      Result Value   Glucose, Bld 110 (*)    BUN 34 (*)    Calcium 8.8  (*)    Albumin 3.4 (*)    All other components within normal limits  CBC - Abnormal; Notable for the following components:   WBC 11.3 (*)    RBC 3.67 (*)    Hemoglobin 11.9 (*)    HCT 36.1 (*)    All other components within normal limits  URINALYSIS, ROUTINE W REFLEX MICROSCOPIC - Abnormal; Notable for the following components:   Color, Urine YELLOW (*)    APPearance CLEAR (*)    Specific Gravity, Urine >1.046 (*)    All other components within normal limits  LIPASE, BLOOD  TROPONIN I (HIGH SENSITIVITY)  TROPONIN I (HIGH SENSITIVITY)     I reviewed labs and they are notable for WBC 11.3, UA neg  EKG  ED ECG REPORT I, Lucillie Garfinkel, the attending physician, personally viewed and interpreted this ECG.   Date: 07/31/2022  EKG Time: 1724  Rate: 79  Rhythm: normal sinus rhythm  Axis: nl  Intervals:none  ST&T Change: non ischemic    RADIOLOGY I independently reviewed and  interpreted CT of the abdomen pelvis and see no obvious inflammatory changes or obstructive patterns   PROCEDURES:  Critical Care performed: No  Procedures   MEDICATIONS ORDERED IN ED: Medications  sodium chloride 0.9 % bolus 1,000 mL (0 mLs Intravenous Stopped 07/31/22 2000)  ondansetron (ZOFRAN) injection 4 mg (4 mg Intravenous Given 07/31/22 1756)  iohexol (OMNIPAQUE) 300 MG/ML solution 100 mL (100 mLs Intravenous Contrast Given 07/31/22 1910)  morphine (PF) 4 MG/ML injection 4 mg (4 mg Intravenous Given 07/31/22 1930)  famotidine (PEPCID) tablet 20 mg (20 mg Oral Given 07/31/22 2011)  alum & mag hydroxide-simeth (MAALOX/MYLANTA) 200-200-20 MG/5ML suspension 30 mL (30 mLs Oral Given 07/31/22 2011)    And  lidocaine (XYLOCAINE) 2 % viscous mouth solution 15 mL (15 mLs Oral Given 07/31/22 2011)     IMPRESSION / MDM / ASSESSMENT AND PLAN / ED COURSE  I reviewed the triage vital signs and the nursing notes.                              Differential diagnosis includes, but is not limited to, gastritis,  ulcer, pancreatitis, cholecystitis, other intra-abdominal infection or obstruction.  ACS.   The patient is on the cardiac monitor to evaluate for evidence of arrhythmia and/or significant heart rate changes.  MDM: Work-up for abdominal pain including labs, LFT, lipase and CT scan of the abdomen pelvis with IV contrast.  IV Zofran and IV crystalloid bolus.  ACS less likely but will evaluate with EKG and troponins  Trops flat ;  CT scan without surgical pathology; minimal central biliary ductal dilation noted but LFT and bili wnl; doubt significant biliary pathology to explain pt's pain which was improved with antacids; reassuring HD and afebrile I doubt cholecystitis/cholangitis/choledocolithiasis given no obstruction on CT and otherwise labs wnl.  Dispo: After careful consideration of this patient's presentation, medical and social risk factors, and evaluation in the emergency department I engaged in shared decision making with the patient and/or their representative to consider admission or observation and this patient was ultimately discharged because work-up unremarkable for emergent pathologies, patient's pain improved with antacid medications, and plan to follow-up with primary doctor this week and return with worsening..   Patient's presentation is most consistent with acute presentation with potential threat to life or bodily function.       FINAL CLINICAL IMPRESSION(S) / ED DIAGNOSES   Final diagnoses:  Generalized abdominal pain     Rx / DC Orders   ED Discharge Orders          Ordered    ondansetron (ZOFRAN) 4 MG tablet  Daily PRN        07/31/22 2054    famotidine (PEPCID) 20 MG tablet  Daily        07/31/22 2055             Note:  This document was prepared using Dragon voice recognition software and may include unintentional dictation errors.    Lucillie Garfinkel, MD 07/31/22 0932    Lucillie Garfinkel, MD 08/01/22 Shelah Lewandowsky

## 2022-07-31 NOTE — ED Triage Notes (Signed)
Pt via POV reporting nausea, PO intolerance with vomiting, and abdominal bloat since Sunday morning. Pt says he has severe epigastric pain after minimal PO intake. No ulcers or hernias.

## 2022-08-01 ENCOUNTER — Other Ambulatory Visit: Payer: Self-pay | Admitting: Family Medicine

## 2022-08-09 ENCOUNTER — Telehealth: Payer: Self-pay

## 2022-08-09 NOTE — Patient Outreach (Signed)
  Care Coordination   08/09/2022 Name: [name] MRN: [id number] DOB: [birth year]   Care Coordination Outreach Attempts:  A third unsuccessful outreach was attempted today to offer the patient with information about available care coordination services as a benefit of their health plan.   Follow Up Plan:  No further outreach attempts will be made at this time. We have been unable to contact the patient to offer or enroll patient in care coordination services  Encounter Outcome:  No Answer  Care Coordination Interventions Activated:  No   Care Coordination Interventions:  No, not indicated    Peter Garter RN, BSN,CCM, [place] [facility] [phone]

## 2022-08-22 ENCOUNTER — Encounter: Payer: Self-pay | Admitting: Emergency Medicine

## 2022-08-22 ENCOUNTER — Emergency Department
Admission: EM | Admit: 2022-08-22 | Discharge: 2022-08-22 | Disposition: A | Discharge status: Home / Self Care | Payer: Medicare Other | Attending: Emergency Medicine | Admitting: Emergency Medicine

## 2022-08-22 ENCOUNTER — Emergency Department: Payer: Medicare Other

## 2022-08-22 DIAGNOSIS — R1013 Epigastric pain: Secondary | ICD-10-CM

## 2022-08-22 DIAGNOSIS — K8050 Calculus of bile duct without cholangitis or cholecystitis without obstruction: Secondary | ICD-10-CM | POA: Diagnosis not present

## 2022-08-22 DIAGNOSIS — J449 Chronic obstructive pulmonary disease, unspecified: Secondary | ICD-10-CM | POA: Insufficient documentation

## 2022-08-22 DIAGNOSIS — D72829 Elevated white blood cell count, unspecified: Secondary | ICD-10-CM | POA: Insufficient documentation

## 2022-08-22 DIAGNOSIS — M1990 Unspecified osteoarthritis, unspecified site: Secondary | ICD-10-CM | POA: Diagnosis not present

## 2022-08-22 DIAGNOSIS — K8020 Calculus of gallbladder without cholecystitis without obstruction: Secondary | ICD-10-CM | POA: Diagnosis not present

## 2022-08-22 DIAGNOSIS — I10 Essential (primary) hypertension: Secondary | ICD-10-CM | POA: Insufficient documentation

## 2022-08-22 DIAGNOSIS — Z9181 History of falling: Secondary | ICD-10-CM | POA: Diagnosis not present

## 2022-08-22 DIAGNOSIS — I69354 Hemiplegia and hemiparesis following cerebral infarction affecting left non-dominant side: Secondary | ICD-10-CM | POA: Diagnosis not present

## 2022-08-22 DIAGNOSIS — R109 Unspecified abdominal pain: Secondary | ICD-10-CM | POA: Diagnosis present

## 2022-08-22 DIAGNOSIS — I69898 Other sequelae of other cerebrovascular disease: Secondary | ICD-10-CM | POA: Diagnosis not present

## 2022-08-22 LAB — CBC
HCT: 43.2 % (ref 39.0–52.0)
Hemoglobin: 14.1 g/dL (ref 13.0–17.0)
MCH: 32.8 pg (ref 26.0–34.0)
MCHC: 32.6 g/dL (ref 30.0–36.0)
MCV: 100.5 fL — ABNORMAL HIGH (ref 80.0–100.0)
Platelets: 473 10*3/uL — ABNORMAL HIGH (ref 150–400)
RBC: 4.3 MIL/uL (ref 4.22–5.81)
RDW: 14.6 % (ref 11.5–15.5)
WBC: 12.4 10*3/uL — ABNORMAL HIGH (ref 4.0–10.5)
nRBC: 0 % (ref 0.0–0.2)

## 2022-08-22 LAB — COMPREHENSIVE METABOLIC PANEL
ALT: 13 U/L (ref 0–44)
AST: 16 U/L (ref 15–41)
Albumin: 4.2 g/dL (ref 3.5–5.0)
Alkaline Phosphatase: 107 U/L (ref 38–126)
Anion gap: 8 (ref 5–15)
BUN: 13 mg/dL (ref 8–23)
CO2: 24 mmol/L (ref 22–32)
Calcium: 9.6 mg/dL (ref 8.9–10.3)
Chloride: 105 mmol/L (ref 98–111)
Creatinine, Ser: 0.84 mg/dL (ref 0.61–1.24)
GFR, Estimated: >60 mL/min (ref >60)
Glucose, Bld: 113 mg/dL — ABNORMAL HIGH (ref 70–99)
Potassium: 3.8 mmol/L (ref 3.5–5.1)
Sodium: 137 mmol/L (ref 135–145)
Total Bilirubin: 0.7 mg/dL (ref 0.3–1.2)
Total Protein: 8.4 g/dL — ABNORMAL HIGH (ref 6.5–8.1)

## 2022-08-22 LAB — URINALYSIS, ROUTINE W REFLEX MICROSCOPIC
Bacteria, UA: NONE SEEN
Bilirubin Urine: NEGATIVE
Glucose, UA: NEGATIVE mg/dL
Hgb urine dipstick: NEGATIVE
Ketones, ur: 5 mg/dL — AB
Leukocytes,Ua: NEGATIVE
Nitrite: NEGATIVE
Protein, ur: 30 mg/dL — AB
Specific Gravity, Urine: 1.021 (ref 1.005–1.030)
Squamous Epithelial / HPF: NONE SEEN (ref 0–5)
pH: 5 (ref 5.0–8.0)

## 2022-08-22 LAB — LIPASE, BLOOD: Lipase: 33 U/L (ref 11–51)

## 2022-08-22 MED ORDER — LACTATED RINGERS IV BOLUS
1000.0000 mL | Freq: Once | INTRAVENOUS | Status: AC
  Administered 2022-08-22: 1000 mL via INTRAVENOUS

## 2022-08-22 MED ORDER — OXYCODONE-ACETAMINOPHEN 5-325 MG PO TABS
1.0000 | ORAL_TABLET | Freq: Once | ORAL | Status: AC
  Administered 2022-08-22: 1 via ORAL
  Filled 2022-08-22: qty 1

## 2022-08-22 MED ORDER — OXYCODONE-ACETAMINOPHEN 5-325 MG PO TABS: 1.0000 | ORAL_TABLET | ORAL | 0 refills | Status: AC | PRN

## 2022-08-22 MED ORDER — ONDANSETRON 4 MG PO TBDP: 4.0000 mg | ORAL_TABLET | Freq: Three times a day (TID) | ORAL | 0 refills | Status: AC | PRN

## 2022-08-22 MED ORDER — MORPHINE SULFATE (PF) 4 MG/ML IV SOLN
4.0000 mg | Freq: Once | INTRAVENOUS | Status: AC
  Administered 2022-08-22: 4 mg via INTRAVENOUS
  Filled 2022-08-22: qty 1

## 2022-08-22 MED ORDER — ONDANSETRON HCL 4 MG/2ML IJ SOLN
4.0000 mg | Freq: Once | INTRAMUSCULAR | Status: AC
  Administered 2022-08-22: 4 mg via INTRAVENOUS
  Filled 2022-08-22: qty 2

## 2022-08-22 NOTE — ED Notes (Signed)
[place] is in room at this time

## 2022-08-22 NOTE — ED Notes (Signed)
Pt is A&Ox4. Pt is ambulatory. Pt endorses inability to keep food or water down. Pt says he has pain in his lower abd. Pt has taken nothing for pain.

## 2022-08-22 NOTE — ED Provider Notes (Signed)
[facility] Provider Note    Event Date/Time   First MD Initiated Contact with Patient 08/22/22 1915     (approximate)   History   Chief Complaint Abdominal Pain   HPI  [name] is a 61 y.o. male with past medical history of hypertension, stroke with left-sided deficits, subdural hematoma, COPD, alcohol abuse, and polysubstance abuse who presents to the ED complaining of abdominal pain.  Patient reports that he has been dealing with about 2 weeks of constant pain in his epigastric area that radiates towards his back.  Pain is worse when he goes to eat or drink anything, when he states he will feel nauseous and occasionally vomit.  He describes the pain as sharp and stabbing, denies any changes in his bowel movements and has not noticed any blood in his emesis or stool.  He states he was told symptoms were due to reflux 2 weeks ago and started on medication for this, has been compliant with the medication with no improvement in his symptoms.  He denies any dysuria, hematuria, fever, or flank pain.     Physical Exam   Triage Vital Signs: ED Triage Vitals  Enc Vitals Group     BP 08/22/22 1911 109/76     Pulse Rate 08/22/22 1911 83     Resp 08/22/22 1911 18     Temp 08/22/22 1911 98.7 F (37.1 C)     Temp Source 08/22/22 1911 Oral     SpO2 08/22/22 1911 98 %     Weight 08/22/22 1910 175 lb (79.4 kg)     Height 08/22/22 1910 5\' 7"  (1.702 m)     Head Circumference --      Peak Flow --      Pain Score 08/22/22 1910 10     Pain Loc --      Pain Edu? --      Excl. in [place]? --     Most recent vital signs: Vitals:   08/22/22 1915 08/22/22 2156  BP: (!) 121/91 (!) 132/105  Pulse: 86 70  Resp:  16  Temp:    SpO2:  98%    Constitutional: Alert and oriented. Eyes: Conjunctivae are normal. Head: Atraumatic. Nose: No congestion/rhinnorhea. Mouth/Throat: Mucous membranes are moist.  Cardiovascular: Normal rate, regular rhythm. Grossly normal heart  sounds.  2+ radial pulses bilaterally. Respiratory: Normal respiratory effort.  No retractions. Lungs CTAB. Gastrointestinal: Soft and diffusely tender to palpation with no rebound or guarding. No distention. Musculoskeletal: No lower extremity tenderness nor edema.  Neurologic:  Normal speech and language. No gross focal neurologic deficits are appreciated.    ED Results / Procedures / Treatments   Labs (all labs ordered are listed, but only abnormal results are displayed) Labs Reviewed  COMPREHENSIVE METABOLIC PANEL - Abnormal; Notable for the following components:      Result Value   Glucose, Bld 113 (*)    Total Protein 8.4 (*)    All other components within normal limits  CBC - Abnormal; Notable for the following components:   WBC 12.4 (*)    MCV 100.5 (*)    Platelets 473 (*)    All other components within normal limits  URINALYSIS, ROUTINE W REFLEX MICROSCOPIC - Abnormal; Notable for the following components:   Color, Urine YELLOW (*)    APPearance CLEAR (*)    Ketones, ur 5 (*)    Protein, ur 30 (*)    All other components within normal limits  LIPASE, BLOOD   RADIOLOGY Right upper quadrant ultrasound reviewed and interpreted by me with shadowing gallstones, no pericholecystic fluid or wall thickening noted.  PROCEDURES:  Critical Care performed: No  Procedures   MEDICATIONS ORDERED IN ED: Medications  morphine (PF) 4 MG/ML injection 4 mg (4 mg Intravenous Given 08/22/22 1941)  ondansetron (ZOFRAN) injection 4 mg (4 mg Intravenous Given 08/22/22 1941)  lactated ringers bolus 1,000 mL (0 mLs Intravenous Stopped 08/22/22 2156)  oxyCODONE-acetaminophen (PERCOCET/ROXICET) 5-325 MG per tablet 1 tablet (1 tablet Oral Given 08/22/22 2210)     IMPRESSION / MDM / ASSESSMENT AND PLAN / ED COURSE  I reviewed the triage vital signs and the nursing notes.                              61 y.o. male with past medical history of hypertension, stroke with left-sided  deficits, COPD, subdural hemorrhage, alcohol abuse, and polysubstance abuse who presents to the ED for 2 weeks of worsening pain diffusely across his abdomen but worse in the epigastric area and radiating towards his back.  Patient's presentation is most consistent with acute presentation with potential threat to life or bodily function.  Differential diagnosis includes, but is not limited to, gastritis, PUD, pancreatitis, cholecystitis, biliary colic, bowel obstruction, kidney stone, UTI, AKI, electrolyte abnormality, hepatitis.  Patient uncomfortable but nontoxic-appearing, vital signs are unremarkable.  He does have diffuse tenderness across his entire abdomen, greatest in the epigastric area.  Symptoms seem most consistent with gastritis and GERD, however patient noted to have some mild central biliary dilatation on prior CT imaging.  We will further assess with right upper quadrant ultrasound, repeat labs including LFTs and lipase.  Plan to treat symptomatically with IV morphine and Zofran, hydrate with IV fluids and reassess following additional results.  Labs are remarkable for mild leukocytosis with no significant anemia, no significant electrolyte abnormality or AKI noted, LFTs and lipase are also reassuring.  Right upper quadrant ultrasound shows gallstones with no evidence of cholecystitis.  On reassessment, patient's pain is improved but still present, nausea has since resolved following Zofran.  He was given dose of Percocet and is appropriate for discharge home with outpatient general surgery follow-up.  He was counseled to return to the ED for new or worsening symptoms, patient goes with plan.      FINAL CLINICAL IMPRESSION(S) / ED DIAGNOSES   Final diagnoses:  Biliary colic  Epigastric pain     Rx / DC Orders   ED Discharge Orders          Ordered    oxyCODONE-acetaminophen (PERCOCET) 5-325 MG tablet  Every 4 hours PRN        08/22/22 2235    ondansetron (ZOFRAN-ODT) 4 MG  disintegrating tablet  Every 8 hours PRN        08/22/22 2235             Note:  This document was prepared using Dragon voice recognition software and may include unintentional dictation errors.   Blake Divine, MD 08/22/22 2238

## 2022-08-22 NOTE — ED Notes (Signed)
Pt endorses no change in pain from previous intervention. Pt asking if provider has figured out what is the matter with him yet. Provider going in to room shortly to discuss with pt.

## 2022-08-22 NOTE — ED Triage Notes (Signed)
Pt presents via POV with complaints of lower abdominal pain with associated N/V, constipation starting yesterday. Pt was seen here 2 weeks ago for the same. He denies any improvement since being seen last. Describes the pain as "someone punching me in the stomach" with radiation to his back. Denies CP or SOB.

## 2022-08-22 NOTE — ED Notes (Signed)
Pt Dc to home. Dc instructions reviewed with all questions answered. Pt verbalizes understanding. IV removed, cath intact, pressure dressing applied. No bleeding noted at site. Pt out of dept via ambulation with steady gait

## 2022-08-23 ENCOUNTER — Telehealth: Payer: Self-pay

## 2022-08-23 ENCOUNTER — Other Ambulatory Visit: Payer: Self-pay | Admitting: Family Medicine

## 2022-08-23 NOTE — Telephone Encounter (Signed)
Message left for the patient to call us regarding a referral from the ED for his gallbladder. He should be scheduled with Dr Dahlia Byes for this.

## 2022-08-24 DIAGNOSIS — R0902 Hypoxemia: Secondary | ICD-10-CM | POA: Diagnosis not present

## 2022-08-24 DIAGNOSIS — R58 Hemorrhage, not elsewhere classified: Secondary | ICD-10-CM | POA: Diagnosis not present

## 2022-08-24 DIAGNOSIS — R6889 Other general symptoms and signs: Secondary | ICD-10-CM | POA: Diagnosis not present

## 2022-08-24 DIAGNOSIS — Z743 Need for continuous supervision: Secondary | ICD-10-CM | POA: Diagnosis not present

## 2022-08-24 DIAGNOSIS — R1111 Vomiting without nausea: Secondary | ICD-10-CM | POA: Diagnosis not present

## 2022-08-25 ENCOUNTER — Emergency Department: Payer: Medicare Other

## 2022-08-25 ENCOUNTER — Emergency Department (HOSPITAL_COMMUNITY): Payer: Medicare Other | Admitting: Critical Care Medicine

## 2022-08-25 ENCOUNTER — Encounter (HOSPITAL_COMMUNITY): Admission: EM | Disposition: E | Payer: Self-pay | Source: Other Acute Inpatient Hospital | Attending: Internal Medicine

## 2022-08-25 ENCOUNTER — Inpatient Hospital Stay (HOSPITAL_COMMUNITY): Payer: Medicare Other

## 2022-08-25 ENCOUNTER — Encounter (HOSPITAL_COMMUNITY): Payer: Self-pay

## 2022-08-25 ENCOUNTER — Other Ambulatory Visit: Payer: Self-pay

## 2022-08-25 ENCOUNTER — Inpatient Hospital Stay: Admit: 2022-08-25 | Payer: Medicare Other

## 2022-08-25 ENCOUNTER — Emergency Department
Admission: EM | Admit: 2022-08-25 | Discharge: 2022-08-25 | Disposition: A | Discharge status: Other Acute Inpatient Hospital | Payer: Medicare Other | Attending: Emergency Medicine | Admitting: Emergency Medicine

## 2022-08-25 ENCOUNTER — Emergency Department (HOSPITAL_COMMUNITY): Payer: Medicare Other

## 2022-08-25 ENCOUNTER — Inpatient Hospital Stay (HOSPITAL_COMMUNITY)
Admission: EM | Admit: 2022-08-25 | Discharge: 2022-08-28 | DRG: 356 | Disposition: E | Payer: Medicare Other | Source: Other Acute Inpatient Hospital | Attending: Internal Medicine | Admitting: Internal Medicine

## 2022-08-25 DIAGNOSIS — G9341 Metabolic encephalopathy: Secondary | ICD-10-CM | POA: Diagnosis present

## 2022-08-25 DIAGNOSIS — Z87442 Personal history of urinary calculi: Secondary | ICD-10-CM

## 2022-08-25 DIAGNOSIS — Z79899 Other long term (current) drug therapy: Secondary | ICD-10-CM

## 2022-08-25 DIAGNOSIS — F32A Depression, unspecified: Secondary | ICD-10-CM | POA: Diagnosis present

## 2022-08-25 DIAGNOSIS — Z801 Family history of malignant neoplasm of trachea, bronchus and lung: Secondary | ICD-10-CM

## 2022-08-25 DIAGNOSIS — E872 Acidosis, unspecified: Secondary | ICD-10-CM | POA: Diagnosis not present

## 2022-08-25 DIAGNOSIS — I728 Aneurysm of other specified arteries: Secondary | ICD-10-CM | POA: Diagnosis present

## 2022-08-25 DIAGNOSIS — A419 Sepsis, unspecified organism: Secondary | ICD-10-CM | POA: Diagnosis not present

## 2022-08-25 DIAGNOSIS — I10 Essential (primary) hypertension: Secondary | ICD-10-CM | POA: Diagnosis not present

## 2022-08-25 DIAGNOSIS — K922 Gastrointestinal hemorrhage, unspecified: Secondary | ICD-10-CM

## 2022-08-25 DIAGNOSIS — K5989 Other specified functional intestinal disorders: Secondary | ICD-10-CM | POA: Diagnosis not present

## 2022-08-25 DIAGNOSIS — K266 Chronic or unspecified duodenal ulcer with both hemorrhage and perforation: Principal | ICD-10-CM | POA: Diagnosis present

## 2022-08-25 DIAGNOSIS — K8689 Other specified diseases of pancreas: Secondary | ICD-10-CM | POA: Diagnosis not present

## 2022-08-25 DIAGNOSIS — D62 Acute posthemorrhagic anemia: Secondary | ICD-10-CM | POA: Diagnosis present

## 2022-08-25 DIAGNOSIS — E875 Hyperkalemia: Secondary | ICD-10-CM | POA: Diagnosis not present

## 2022-08-25 DIAGNOSIS — R578 Other shock: Secondary | ICD-10-CM

## 2022-08-25 DIAGNOSIS — F17210 Nicotine dependence, cigarettes, uncomplicated: Secondary | ICD-10-CM | POA: Diagnosis not present

## 2022-08-25 DIAGNOSIS — K2980 Duodenitis without bleeding: Secondary | ICD-10-CM | POA: Diagnosis not present

## 2022-08-25 DIAGNOSIS — J449 Chronic obstructive pulmonary disease, unspecified: Secondary | ICD-10-CM | POA: Diagnosis not present

## 2022-08-25 DIAGNOSIS — J9601 Acute respiratory failure with hypoxia: Secondary | ICD-10-CM | POA: Diagnosis not present

## 2022-08-25 DIAGNOSIS — Z515 Encounter for palliative care: Secondary | ICD-10-CM | POA: Diagnosis not present

## 2022-08-25 DIAGNOSIS — Z87898 Personal history of other specified conditions: Secondary | ICD-10-CM

## 2022-08-25 DIAGNOSIS — K2970 Gastritis, unspecified, without bleeding: Secondary | ICD-10-CM | POA: Diagnosis not present

## 2022-08-25 DIAGNOSIS — D65 Disseminated intravascular coagulation [defibrination syndrome]: Secondary | ICD-10-CM | POA: Diagnosis not present

## 2022-08-25 DIAGNOSIS — Z8261 Family history of arthritis: Secondary | ICD-10-CM

## 2022-08-25 DIAGNOSIS — K3189 Other diseases of stomach and duodenum: Secondary | ICD-10-CM | POA: Diagnosis not present

## 2022-08-25 DIAGNOSIS — Z888 Allergy status to other drugs, medicaments and biological substances status: Secondary | ICD-10-CM

## 2022-08-25 DIAGNOSIS — I468 Cardiac arrest due to other underlying condition: Secondary | ICD-10-CM | POA: Diagnosis not present

## 2022-08-25 DIAGNOSIS — Z4659 Encounter for fitting and adjustment of other gastrointestinal appliance and device: Secondary | ICD-10-CM | POA: Diagnosis not present

## 2022-08-25 DIAGNOSIS — I469 Cardiac arrest, cause unspecified: Secondary | ICD-10-CM

## 2022-08-25 DIAGNOSIS — N179 Acute kidney failure, unspecified: Secondary | ICD-10-CM | POA: Diagnosis present

## 2022-08-25 DIAGNOSIS — J9602 Acute respiratory failure with hypercapnia: Secondary | ICD-10-CM | POA: Diagnosis not present

## 2022-08-25 DIAGNOSIS — Z8249 Family history of ischemic heart disease and other diseases of the circulatory system: Secondary | ICD-10-CM | POA: Diagnosis not present

## 2022-08-25 DIAGNOSIS — Z66 Do not resuscitate: Secondary | ICD-10-CM | POA: Diagnosis present

## 2022-08-25 DIAGNOSIS — Z4682 Encounter for fitting and adjustment of non-vascular catheter: Secondary | ICD-10-CM | POA: Diagnosis not present

## 2022-08-25 DIAGNOSIS — N170 Acute kidney failure with tubular necrosis: Secondary | ICD-10-CM | POA: Diagnosis present

## 2022-08-25 DIAGNOSIS — K920 Hematemesis: Secondary | ICD-10-CM | POA: Diagnosis not present

## 2022-08-25 DIAGNOSIS — M47816 Spondylosis without myelopathy or radiculopathy, lumbar region: Secondary | ICD-10-CM | POA: Diagnosis not present

## 2022-08-25 DIAGNOSIS — F17200 Nicotine dependence, unspecified, uncomplicated: Secondary | ICD-10-CM | POA: Diagnosis not present

## 2022-08-25 DIAGNOSIS — I2510 Atherosclerotic heart disease of native coronary artery without angina pectoris: Secondary | ICD-10-CM | POA: Diagnosis not present

## 2022-08-25 DIAGNOSIS — J439 Emphysema, unspecified: Secondary | ICD-10-CM | POA: Diagnosis not present

## 2022-08-25 DIAGNOSIS — K7200 Acute and subacute hepatic failure without coma: Secondary | ICD-10-CM | POA: Diagnosis not present

## 2022-08-25 DIAGNOSIS — K859 Acute pancreatitis without necrosis or infection, unspecified: Secondary | ICD-10-CM | POA: Diagnosis not present

## 2022-08-25 DIAGNOSIS — Z791 Long term (current) use of non-steroidal anti-inflammatories (NSAID): Secondary | ICD-10-CM

## 2022-08-25 DIAGNOSIS — I69354 Hemiplegia and hemiparesis following cerebral infarction affecting left non-dominant side: Secondary | ICD-10-CM | POA: Diagnosis not present

## 2022-08-25 DIAGNOSIS — N2889 Other specified disorders of kidney and ureter: Secondary | ICD-10-CM | POA: Diagnosis not present

## 2022-08-25 DIAGNOSIS — Z87891 Personal history of nicotine dependence: Secondary | ICD-10-CM | POA: Diagnosis not present

## 2022-08-25 DIAGNOSIS — Z803 Family history of malignant neoplasm of breast: Secondary | ICD-10-CM | POA: Diagnosis not present

## 2022-08-25 DIAGNOSIS — F1010 Alcohol abuse, uncomplicated: Secondary | ICD-10-CM | POA: Diagnosis present

## 2022-08-25 DIAGNOSIS — Z452 Encounter for adjustment and management of vascular access device: Secondary | ICD-10-CM | POA: Diagnosis not present

## 2022-08-25 DIAGNOSIS — K260 Acute duodenal ulcer with hemorrhage: Secondary | ICD-10-CM

## 2022-08-25 DIAGNOSIS — Z7982 Long term (current) use of aspirin: Secondary | ICD-10-CM

## 2022-08-25 HISTORY — PX: RADIOLOGY WITH ANESTHESIA: SHX6223

## 2022-08-25 HISTORY — PX: IR ANGIOGRAM VISCERAL SELECTIVE: IMG657

## 2022-08-25 HISTORY — PX: IR US GUIDE VASC ACCESS RIGHT: IMG2390

## 2022-08-25 HISTORY — PX: IR EMBO ART  VEN HEMORR LYMPH EXTRAV  INC GUIDE ROADMAPPING: IMG5450

## 2022-08-25 LAB — POCT I-STAT 7, (LYTES, BLD GAS, ICA,H+H)
Acid-base deficit: 22 mmol/L — ABNORMAL HIGH (ref 0.0–2.0)
Acid-base deficit: 21 mmol/L — ABNORMAL HIGH (ref 0.0–2.0)
Bicarbonate: 11.4 mmol/L — ABNORMAL LOW (ref 20.0–28.0)
Bicarbonate: 11.4 mmol/L — ABNORMAL LOW (ref 20.0–28.0)
Calcium, Ion: 0.98 mmol/L — ABNORMAL LOW (ref 1.15–1.40)
Calcium, Ion: 1.19 mmol/L (ref 1.15–1.40)
HCT: 26 % — ABNORMAL LOW (ref 39.0–52.0)
HCT: 32 % — ABNORMAL LOW (ref 39.0–52.0)
Hemoglobin: 8.8 g/dL — ABNORMAL LOW (ref 13.0–17.0)
Hemoglobin: 10.9 g/dL — ABNORMAL LOW (ref 13.0–17.0)
O2 Saturation: 76 %
O2 Saturation: 98 %
Patient temperature: 34.7
Potassium: 6.1 mmol/L — ABNORMAL HIGH (ref 3.5–5.1)
Potassium: 6 mmol/L — ABNORMAL HIGH (ref 3.5–5.1)
Sodium: 140 mmol/L (ref 135–145)
Sodium: 139 mmol/L (ref 135–145)
TCO2: 14 mmol/L — ABNORMAL LOW (ref 22–32)
TCO2: 13 mmol/L — ABNORMAL LOW (ref 22–32)
pCO2 arterial: 73.5 mmHg (ref 32–48)
pCO2 arterial: 53.3 mmHg — ABNORMAL HIGH (ref 32–48)
pH, Arterial: 6.799 — CL (ref 7.35–7.45)
pH, Arterial: 6.92 — CL (ref 7.35–7.45)
pO2, Arterial: 75 mmHg — ABNORMAL LOW (ref 83–108)
pO2, Arterial: 159 mmHg — ABNORMAL HIGH (ref 83–108)

## 2022-08-25 LAB — CBC WITH DIFFERENTIAL/PLATELET
Abs Immature Granulocytes: 0.12 10*3/uL — ABNORMAL HIGH (ref 0.00–0.07)
Abs Immature Granulocytes: 0.32 10*3/uL — ABNORMAL HIGH (ref 0.00–0.07)
Abs Immature Granulocytes: 0 10*3/uL (ref 0.00–0.07)
Basophils Absolute: 0.1 10*3/uL (ref 0.0–0.1)
Basophils Absolute: 0.1 10*3/uL (ref 0.0–0.1)
Basophils Absolute: 0 10*3/uL (ref 0.0–0.1)
Basophils Relative: 1 %
Basophils Relative: 1 %
Basophils Relative: 0 %
Eosinophils Absolute: 0.1 10*3/uL (ref 0.0–0.5)
Eosinophils Absolute: 0 10*3/uL (ref 0.0–0.5)
Eosinophils Absolute: 0.3 10*3/uL (ref 0.0–0.5)
Eosinophils Relative: 1 %
Eosinophils Relative: 0 %
Eosinophils Relative: 2 %
HCT: 34.3 % — ABNORMAL LOW (ref 39.0–52.0)
HCT: 41.7 % (ref 39.0–52.0)
HCT: 36.5 % — ABNORMAL LOW (ref 39.0–52.0)
Hemoglobin: 10.9 g/dL — ABNORMAL LOW (ref 13.0–17.0)
Hemoglobin: 13.5 g/dL (ref 13.0–17.0)
Hemoglobin: 11.7 g/dL — ABNORMAL LOW (ref 13.0–17.0)
Immature Granulocytes: 1 %
Immature Granulocytes: 3 %
Lymphocytes Relative: 6 %
Lymphocytes Relative: 10 %
Lymphocytes Relative: 12 %
Lymphs Abs: 1.1 10*3/uL (ref 0.7–4.0)
Lymphs Abs: 1 10*3/uL (ref 0.7–4.0)
Lymphs Abs: 1.6 10*3/uL (ref 0.7–4.0)
MCH: 33 pg (ref 26.0–34.0)
MCH: 29 pg (ref 26.0–34.0)
MCH: 29.8 pg (ref 26.0–34.0)
MCHC: 31.8 g/dL (ref 30.0–36.0)
MCHC: 32.4 g/dL (ref 30.0–36.0)
MCHC: 32.1 g/dL (ref 30.0–36.0)
MCV: 103.9 fL — ABNORMAL HIGH (ref 80.0–100.0)
MCV: 89.5 fL (ref 80.0–100.0)
MCV: 92.9 fL (ref 80.0–100.0)
Monocytes Absolute: 0.9 10*3/uL (ref 0.1–1.0)
Monocytes Absolute: 0.6 10*3/uL (ref 0.1–1.0)
Monocytes Absolute: 0.5 10*3/uL (ref 0.1–1.0)
Monocytes Relative: 5 %
Monocytes Relative: 6 %
Monocytes Relative: 4 %
Neutro Abs: 16.6 10*3/uL — ABNORMAL HIGH (ref 1.7–7.7)
Neutro Abs: 7.8 10*3/uL — ABNORMAL HIGH (ref 1.7–7.7)
Neutro Abs: 11.1 10*3/uL — ABNORMAL HIGH (ref 1.7–7.7)
Neutrophils Relative %: 86 %
Neutrophils Relative %: 80 %
Neutrophils Relative %: 82 %
Platelets: 371 10*3/uL (ref 150–400)
Platelets: 155 10*3/uL (ref 150–400)
Platelets: 184 10*3/uL (ref 150–400)
RBC: 3.3 MIL/uL — ABNORMAL LOW (ref 4.22–5.81)
RBC: 4.66 MIL/uL (ref 4.22–5.81)
RBC: 3.93 MIL/uL — ABNORMAL LOW (ref 4.22–5.81)
RDW: 14.2 % (ref 11.5–15.5)
RDW: 16.2 % — ABNORMAL HIGH (ref 11.5–15.5)
RDW: 16.2 % — ABNORMAL HIGH (ref 11.5–15.5)
WBC: 18.9 10*3/uL — ABNORMAL HIGH (ref 4.0–10.5)
WBC: 9.8 10*3/uL (ref 4.0–10.5)
WBC: 13.5 10*3/uL — ABNORMAL HIGH (ref 4.0–10.5)
nRBC: 0 % (ref 0.0–0.2)
nRBC: 0.3 % — ABNORMAL HIGH (ref 0.0–0.2)
nRBC: 0.5 % — ABNORMAL HIGH (ref 0.0–0.2)
nRBC: 2 /100 WBC — ABNORMAL HIGH

## 2022-08-25 LAB — COMPREHENSIVE METABOLIC PANEL
ALT: 16 U/L (ref 0–44)
ALT: 1069 U/L — ABNORMAL HIGH (ref 0–44)
AST: 27 U/L (ref 15–41)
AST: 923 U/L — ABNORMAL HIGH (ref 15–41)
Albumin: 3.3 g/dL — ABNORMAL LOW (ref 3.5–5.0)
Albumin: <1.5 g/dL — ABNORMAL LOW (ref 3.5–5.0)
Alkaline Phosphatase: 90 U/L (ref 38–126)
Alkaline Phosphatase: 96 U/L (ref 38–126)
Anion gap: 12 (ref 5–15)
Anion gap: 20 — ABNORMAL HIGH (ref 5–15)
BUN: 39 mg/dL — ABNORMAL HIGH (ref 8–23)
BUN: 35 mg/dL — ABNORMAL HIGH (ref 8–23)
CO2: 22 mmol/L (ref 22–32)
CO2: 13 mmol/L — ABNORMAL LOW (ref 22–32)
Calcium: 8.8 mg/dL — ABNORMAL LOW (ref 8.9–10.3)
Calcium: 10 mg/dL (ref 8.9–10.3)
Chloride: 105 mmol/L (ref 98–111)
Chloride: 112 mmol/L — ABNORMAL HIGH (ref 98–111)
Creatinine, Ser: 1.93 mg/dL — ABNORMAL HIGH (ref 0.61–1.24)
Creatinine, Ser: 2.14 mg/dL — ABNORMAL HIGH (ref 0.61–1.24)
GFR, Estimated: 38 mL/min — ABNORMAL LOW (ref >60)
GFR, Estimated: 34 mL/min — ABNORMAL LOW (ref >60)
Glucose, Bld: 86 mg/dL (ref 70–99)
Glucose, Bld: 235 mg/dL — ABNORMAL HIGH (ref 70–99)
Potassium: 5.1 mmol/L (ref 3.5–5.1)
Potassium: 6.1 mmol/L — ABNORMAL HIGH (ref 3.5–5.1)
Sodium: 139 mmol/L (ref 135–145)
Sodium: 145 mmol/L (ref 135–145)
Total Bilirubin: 1.1 mg/dL (ref 0.3–1.2)
Total Bilirubin: 0.6 mg/dL (ref 0.3–1.2)
Total Protein: 6.9 g/dL (ref 6.5–8.1)
Total Protein: <3 g/dL — ABNORMAL LOW (ref 6.5–8.1)

## 2022-08-25 LAB — PREPARE PLATELET PHERESIS: Unit division: 0

## 2022-08-25 LAB — DIC (DISSEMINATED INTRAVASCULAR COAGULATION)PANEL
D-Dimer, Quant: 19.6 ug/mL-FEU — ABNORMAL HIGH (ref 0.00–0.50)
D-Dimer, Quant: >20 ug/mL-FEU — ABNORMAL HIGH (ref 0.00–0.50)
Fibrinogen: 211 mg/dL (ref 210–475)
Fibrinogen: 79 mg/dL — CL (ref 210–475)
INR: 1.7 — ABNORMAL HIGH (ref 0.8–1.2)
INR: 4 — ABNORMAL HIGH (ref 0.8–1.2)
Platelets: 160 10*3/uL (ref 150–400)
Platelets: 107 10*3/uL — ABNORMAL LOW (ref 150–400)
Prothrombin Time: 20 seconds — ABNORMAL HIGH (ref 11.4–15.2)
Prothrombin Time: 39 seconds — ABNORMAL HIGH (ref 11.4–15.2)
Smear Review: NONE SEEN
Smear Review: NONE SEEN
aPTT: 60 seconds — ABNORMAL HIGH (ref 24–36)
aPTT: >200 seconds (ref 24–36)

## 2022-08-25 LAB — I-STAT CHEM 8, ED
BUN: 49 mg/dL — ABNORMAL HIGH (ref 8–23)
Calcium, Ion: 0.85 mmol/L — CL (ref 1.15–1.40)
Chloride: 110 mmol/L (ref 98–111)
Creatinine, Ser: 1.8 mg/dL — ABNORMAL HIGH (ref 0.61–1.24)
Glucose, Bld: 255 mg/dL — ABNORMAL HIGH (ref 70–99)
HCT: 37 % — ABNORMAL LOW (ref 39.0–52.0)
Hemoglobin: 12.6 g/dL — ABNORMAL LOW (ref 13.0–17.0)
Potassium: 7.2 mmol/L (ref 3.5–5.1)
Sodium: 138 mmol/L (ref 135–145)
TCO2: 15 mmol/L — ABNORMAL LOW (ref 22–32)

## 2022-08-25 LAB — PROTIME-INR
INR: 1.1 (ref 0.8–1.2)
Prothrombin Time: 14 seconds (ref 11.4–15.2)

## 2022-08-25 LAB — I-STAT VENOUS BLOOD GAS, ED
Acid-base deficit: 23 mmol/L — ABNORMAL HIGH (ref 0.0–2.0)
Bicarbonate: 10.6 mmol/L — ABNORMAL LOW (ref 20.0–28.0)
Calcium, Ion: 0.87 mmol/L — CL (ref 1.15–1.40)
HCT: 36 % — ABNORMAL LOW (ref 39.0–52.0)
Hemoglobin: 12.2 g/dL — ABNORMAL LOW (ref 13.0–17.0)
O2 Saturation: 45 %
Potassium: 7.3 mmol/L (ref 3.5–5.1)
Sodium: 138 mmol/L (ref 135–145)
TCO2: 12 mmol/L — ABNORMAL LOW (ref 22–32)
pCO2, Ven: 57 mmHg (ref 44–60)
pH, Ven: 6.877 — CL (ref 7.25–7.43)
pO2, Ven: 42 mmHg (ref 32–45)

## 2022-08-25 LAB — LACTIC ACID, PLASMA
Lactic Acid, Venous: 2.5 mmol/L (ref 0.5–1.9)
Lactic Acid, Venous: >9 mmol/L (ref 0.5–1.9)

## 2022-08-25 LAB — BPAM PLATELET PHERESIS
Blood Product Expiration Date: 202310302359
ISSUE DATE / TIME: 202310290830
Unit Type and Rh: 6200

## 2022-08-25 LAB — GLOBAL TEG PANEL
CFF Max Amplitude: 18.8 mm (ref 15–32)
CK with Heparinase (R): 7.3 min (ref 4.3–8.3)
Citrated Functional Fibrinogen: 343.1 mg/dL (ref 278–581)
Citrated Kaolin (K): >5 min — ABNORMAL HIGH (ref 0.8–2.1)
Citrated Kaolin (MA): <40 mm — ABNORMAL LOW (ref 52–69)
Citrated Kaolin (R): 9.9 min — ABNORMAL HIGH (ref 4.6–9.1)
Citrated Kaolin Angle: 41 deg — ABNORMAL LOW (ref 63–78)
Citrated Rapid TEG (MA): <40 mm — ABNORMAL LOW (ref 52–70)

## 2022-08-25 LAB — MAGNESIUM: Magnesium: 2.8 mg/dL — ABNORMAL HIGH (ref 1.7–2.4)

## 2022-08-25 LAB — MASSIVE TRANSFUSION PROTOCOL ORDER (BLOOD BANK NOTIFICATION)

## 2022-08-25 LAB — APTT: aPTT: 29 seconds (ref 24–36)

## 2022-08-25 LAB — ABO/RH: ABO/RH(D): AB NEG

## 2022-08-25 LAB — PREPARE RBC (CROSSMATCH)

## 2022-08-25 LAB — GLUCOSE, CAPILLARY
Glucose-Capillary: 207 mg/dL — ABNORMAL HIGH (ref 70–99)
Glucose-Capillary: 180 mg/dL — ABNORMAL HIGH (ref 70–99)

## 2022-08-25 SURGERY — IR WITH ANESTHESIA
Anesthesia: General

## 2022-08-25 MED ORDER — LIDOCAINE HCL 1 % IJ SOLN
INTRAMUSCULAR | Status: AC
  Filled 2022-08-25: qty 20

## 2022-08-25 MED ORDER — IOHEXOL 350 MG/ML SOLN
75.0000 mL | Freq: Once | INTRAVENOUS | Status: AC | PRN
  Administered 2022-08-25: 75 mL via INTRAVENOUS

## 2022-08-25 MED ORDER — IPRATROPIUM-ALBUTEROL 0.5-2.5 (3) MG/3ML IN SOLN
3.0000 mL | Freq: Four times a day (QID) | RESPIRATORY_TRACT | Status: DC
  Filled 2022-08-25: qty 3

## 2022-08-25 MED ORDER — ONDANSETRON HCL 4 MG/2ML IJ SOLN
4.0000 mg | Freq: Once | INTRAMUSCULAR | Status: AC
  Administered 2022-08-25: 4 mg via INTRAVENOUS
  Filled 2022-08-25: qty 2

## 2022-08-25 MED ORDER — MAGNESIUM SULFATE 2 GM/50ML IV SOLN
2.0000 g | Freq: Once | INTRAVENOUS | Status: AC
  Administered 2022-08-25: 2 g via INTRAVENOUS

## 2022-08-25 MED ORDER — SODIUM BICARBONATE 8.4 % IV SOLN
INTRAVENOUS | Status: DC | PRN
  Administered 2022-08-25 (×2): 50 meq via INTRAVENOUS

## 2022-08-25 MED ORDER — SODIUM CHLORIDE 0.9 % IV BOLUS (SEPSIS)
2000.0000 mL | Freq: Once | INTRAVENOUS | Status: AC
  Administered 2022-08-25: 2000 mL via INTRAVENOUS

## 2022-08-25 MED ORDER — IOHEXOL 300 MG/ML  SOLN
150.0000 mL | Freq: Once | INTRAMUSCULAR | Status: AC | PRN
  Administered 2022-08-25: 55 mL via INTRA_ARTERIAL

## 2022-08-25 MED ORDER — PANTOPRAZOLE SODIUM 40 MG IV SOLR
40.0000 mg | Freq: Two times a day (BID) | INTRAVENOUS | Status: DC
  Administered 2022-08-25: 40 mg via INTRAVENOUS
  Filled 2022-08-25: qty 10

## 2022-08-25 MED ORDER — ACETAMINOPHEN 500 MG PO TABS
1000.0000 mg | ORAL_TABLET | Freq: Once | ORAL | Status: AC
  Administered 2022-08-25: 1000 mg via ORAL
  Filled 2022-08-25: qty 2

## 2022-08-25 MED ORDER — CALCIUM CHLORIDE 10 % IV SOLN: 1.0000 g | Freq: Once | INTRAVENOUS | Status: DC

## 2022-08-25 MED ORDER — PANTOPRAZOLE INFUSION (NEW) - SIMPLE MED
8.0000 mg/h | INTRAVENOUS | Status: DC
  Filled 2022-08-25: qty 100

## 2022-08-25 MED ORDER — CALCIUM CHLORIDE 10 % IV SOLN
INTRAVENOUS | Status: AC
  Filled 2022-08-25: qty 10

## 2022-08-25 MED ORDER — EPINEPHRINE HCL 5 MG/250ML IV SOLN IN NS
0.5000 ug/min | INTRAVENOUS | Status: DC
  Administered 2022-08-25: 7 ug/min via INTRAVENOUS
  Filled 2022-08-25: qty 250

## 2022-08-25 MED ORDER — PANTOPRAZOLE SODIUM 40 MG IV SOLR: 40.0000 mg | Freq: Once | INTRAVENOUS | Status: DC

## 2022-08-25 MED ORDER — VASOPRESSIN 20 UNIT/ML IV SOLN
INTRAVENOUS | Status: DC | PRN
  Administered 2022-08-25: 2 [IU] via INTRAVENOUS
  Administered 2022-08-25: 4 [IU] via INTRAVENOUS

## 2022-08-25 MED ORDER — SODIUM CHLORIDE 0.9 % IV SOLN
INTRAVENOUS | Status: DC | PRN
  Administered 2022-08-25: 5 ug/min via INTRAVENOUS

## 2022-08-25 MED ORDER — NOREPINEPHRINE 4 MG/250ML-% IV SOLN
INTRAVENOUS | Status: AC
  Filled 2022-08-25: qty 250

## 2022-08-25 MED ORDER — ORAL CARE MOUTH RINSE: 15.0000 mL | OROMUCOSAL | Status: DC | PRN

## 2022-08-25 MED ORDER — FENTANYL 2500MCG IN NS 250ML (10MCG/ML) PREMIX INFUSION
INTRAVENOUS | Status: AC
  Administered 2022-08-25: 50 ug/h
  Filled 2022-08-25: qty 250

## 2022-08-25 MED ORDER — NOREPINEPHRINE 4 MG/250ML-% IV SOLN
0.0000 ug/min | INTRAVENOUS | Status: DC
  Administered 2022-08-25: 30 ug/min via INTRAVENOUS
  Filled 2022-08-25 (×2): qty 250

## 2022-08-25 MED ORDER — OCTREOTIDE LOAD VIA INFUSION: 100.0000 ug | Freq: Once | INTRAVENOUS | Status: DC

## 2022-08-25 MED ORDER — SODIUM CHLORIDE 0.9% IV SOLUTION: Freq: Once | INTRAVENOUS | Status: AC

## 2022-08-25 MED ORDER — INSULIN ASPART 100 UNIT/ML IJ SOLN
0.0000 [IU] | INTRAMUSCULAR | Status: DC
  Administered 2022-08-25: 2 [IU] via SUBCUTANEOUS

## 2022-08-25 MED ORDER — NOREPINEPHRINE 16 MG/250ML-% IV SOLN
0.0000 ug/min | INTRAVENOUS | Status: DC
  Administered 2022-08-25: 39 ug/min via INTRAVENOUS
  Filled 2022-08-25: qty 250

## 2022-08-25 MED ORDER — FENTANYL CITRATE PF 50 MCG/ML IJ SOSY
100.0000 ug | PREFILLED_SYRINGE | Freq: Once | INTRAMUSCULAR | Status: DC
  Administered 2022-08-25: 100 ug via INTRAVENOUS
  Filled 2022-08-25 (×2): qty 2

## 2022-08-25 MED ORDER — GELATIN ABSORBABLE 12-7 MM EX MISC
CUTANEOUS | Status: AC
  Filled 2022-08-25: qty 1

## 2022-08-25 MED ORDER — SODIUM CHLORIDE 0.9 % IV SOLN
2.0000 g | INTRAVENOUS | Status: DC
  Administered 2022-08-25: 2 g via INTRAVENOUS
  Filled 2022-08-25: qty 20

## 2022-08-25 MED ORDER — SODIUM CHLORIDE 0.9 % IV SOLN
2.0000 g | Freq: Once | INTRAVENOUS | Status: AC
  Administered 2022-08-25: 2 g via INTRAVENOUS
  Filled 2022-08-25: qty 12.5

## 2022-08-25 MED ORDER — POLYETHYLENE GLYCOL 3350 17 G PO PACK: 17.0000 g | PACK | Freq: Every day | ORAL | Status: DC | PRN

## 2022-08-25 MED ORDER — ORAL CARE MOUTH RINSE
15.0000 mL | OROMUCOSAL | Status: DC
  Administered 2022-08-25 (×2): 15 mL via OROMUCOSAL

## 2022-08-25 MED ORDER — VASOPRESSIN 20 UNITS/100 ML INFUSION FOR SHOCK
0.0000 [IU]/min | INTRAVENOUS | Status: DC
  Administered 2022-08-25: 0.03 [IU]/min via INTRAVENOUS
  Filled 2022-08-25: qty 100

## 2022-08-25 MED ORDER — VANCOMYCIN VARIABLE DOSE PER UNSTABLE RENAL FUNCTION (PHARMACIST DOSING): Status: DC

## 2022-08-25 MED ORDER — NOREPINEPHRINE 4 MG/250ML-% IV SOLN: 0.0000 ug/min | INTRAVENOUS | Status: DC

## 2022-08-25 MED ORDER — EPINEPHRINE 1 MG/10ML IJ SOSY
PREFILLED_SYRINGE | INTRAMUSCULAR | Status: DC | PRN
  Administered 2022-08-25 (×2): 1 mg via INTRAVENOUS

## 2022-08-25 MED ORDER — ROCURONIUM BROMIDE 10 MG/ML (PF) SYRINGE
PREFILLED_SYRINGE | INTRAVENOUS | Status: DC | PRN
  Administered 2022-08-25: 100 mg via INTRAVENOUS

## 2022-08-25 MED ORDER — EPINEPHRINE 1 MG/10ML IJ SOSY
PREFILLED_SYRINGE | INTRAMUSCULAR | Status: AC
  Administered 2022-08-25: 0.3 mg via INTRAVENOUS
  Filled 2022-08-25: qty 50

## 2022-08-25 MED ORDER — VANCOMYCIN HCL 1500 MG/300ML IV SOLN
1500.0000 mg | Freq: Once | INTRAVENOUS | Status: AC
  Administered 2022-08-25: 1500 mg via INTRAVENOUS
  Filled 2022-08-25: qty 300

## 2022-08-25 MED ORDER — METRONIDAZOLE 500 MG/100ML IV SOLN
500.0000 mg | Freq: Two times a day (BID) | INTRAVENOUS | Status: DC
  Administered 2022-08-25: 500 mg via INTRAVENOUS
  Filled 2022-08-25: qty 100

## 2022-08-25 MED ORDER — SODIUM BICARBONATE 8.4 % IV SOLN
INTRAVENOUS | Status: AC
  Filled 2022-08-25: qty 150

## 2022-08-25 MED ORDER — NOREPINEPHRINE 4 MG/250ML-% IV SOLN
INTRAVENOUS | Status: DC | PRN
  Administered 2022-08-25: 30 ug/min via INTRAVENOUS

## 2022-08-25 MED ORDER — EPINEPHRINE PF 1 MG/ML IJ SOLN
INTRAMUSCULAR | Status: DC | PRN
  Administered 2022-08-25 (×3): 1 mg via INTRAVENOUS

## 2022-08-25 MED ORDER — SODIUM CHLORIDE 0.9 % IV SOLN: 50.0000 ug/h | INTRAVENOUS | Status: DC

## 2022-08-25 MED ORDER — LACTATED RINGERS IV SOLN: INTRAVENOUS | Status: DC | PRN

## 2022-08-25 MED ORDER — CALCIUM CHLORIDE 10 % IV SOLN
INTRAVENOUS | Status: DC | PRN
  Administered 2022-08-25 (×2): 1 g via INTRAVENOUS

## 2022-08-25 MED ORDER — IOHEXOL 300 MG/ML  SOLN: 100.0000 mL | Freq: Once | INTRAMUSCULAR | Status: DC | PRN

## 2022-08-25 MED ORDER — THIAMINE HCL 100 MG/ML IJ SOLN
100.0000 mg | Freq: Every day | INTRAMUSCULAR | Status: DC
  Administered 2022-08-25: 100 mg via INTRAVENOUS
  Filled 2022-08-25: qty 2

## 2022-08-25 MED ORDER — SODIUM CHLORIDE 0.9 % IV SOLN
1.0000 g | Freq: Once | INTRAVENOUS | Status: AC
  Administered 2022-08-25: 1 g via INTRAVENOUS

## 2022-08-25 MED ORDER — SODIUM CHLORIDE 0.9 % IV SOLN
50.0000 ug/h | INTRAVENOUS | Status: DC
  Filled 2022-08-25 (×2): qty 1

## 2022-08-25 MED ORDER — SODIUM BICARBONATE 8.4 % IV SOLN
150.0000 meq | Freq: Once | INTRAVENOUS | Status: AC
  Administered 2022-08-25: 150 meq via INTRAVENOUS

## 2022-08-25 MED ORDER — FENTANYL CITRATE PF 50 MCG/ML IJ SOSY: 50.0000 ug | PREFILLED_SYRINGE | INTRAMUSCULAR | Status: DC | PRN

## 2022-08-25 MED ORDER — VANCOMYCIN HCL 2000 MG/400ML IV SOLN
2000.0000 mg | Freq: Once | INTRAVENOUS | Status: DC
  Filled 2022-08-25: qty 400

## 2022-08-25 MED ORDER — SODIUM BICARBONATE 8.4 % IV SOLN
INTRAVENOUS | Status: DC | PRN
  Administered 2022-08-25: 50 meq via INTRAVENOUS

## 2022-08-25 MED ORDER — SODIUM BICARBONATE 8.4 % IV SOLN
INTRAVENOUS | Status: DC
  Administered 2022-08-25: 125 mL/h via INTRAVENOUS
  Filled 2022-08-25 (×2): qty 1000

## 2022-08-25 MED ORDER — NOREPINEPHRINE 4 MG/250ML-% IV SOLN
INTRAVENOUS | Status: AC
  Administered 2022-08-25: 40 ug/min via INTRAVENOUS
  Filled 2022-08-25: qty 250

## 2022-08-25 MED ORDER — FOLIC ACID 5 MG/ML IJ SOLN
1.0000 mg | Freq: Every day | INTRAMUSCULAR | Status: DC
  Administered 2022-08-25: 1 mg via INTRAVENOUS
  Filled 2022-08-25: qty 0.2

## 2022-08-25 MED ORDER — PANTOPRAZOLE 2 MG/ML SUSPENSION: 40.0000 mg | Freq: Every day | ORAL | Status: DC

## 2022-08-25 MED ORDER — FENTANYL CITRATE PF 50 MCG/ML IJ SOSY
50.0000 ug | PREFILLED_SYRINGE | Freq: Once | INTRAMUSCULAR | Status: AC
  Administered 2022-08-25: 50 ug via INTRAVENOUS
  Filled 2022-08-25: qty 1

## 2022-08-25 MED ORDER — OCTREOTIDE LOAD VIA INFUSION
50.0000 ug | Freq: Once | INTRAVENOUS | Status: DC
  Filled 2022-08-25: qty 25

## 2022-08-26 ENCOUNTER — Other Ambulatory Visit (HOSPITAL_COMMUNITY): Payer: Self-pay | Admitting: Interventional Radiology

## 2022-08-26 ENCOUNTER — Encounter (HOSPITAL_COMMUNITY): Payer: Self-pay | Admitting: Radiology

## 2022-08-26 DIAGNOSIS — K922 Gastrointestinal hemorrhage, unspecified: Secondary | ICD-10-CM

## 2022-08-26 LAB — TYPE AND SCREEN
ABO/RH(D): AB NEG
ABO/RH(D): AB NEG
ABO/RH(D): AB NEG
Antibody Screen: NEGATIVE
Antibody Screen: NEGATIVE
Antibody Screen: NEGATIVE
Unit division: 0
Unit division: 0
Unit division: 0
Unit division: 0
Unit division: 0
Unit division: 0
Unit division: 0
Unit division: 0
Unit division: 0
Unit division: 0
Unit division: 0
Unit division: 0
Unit division: 0
Unit division: 0
Unit division: 0
Unit division: 0
Unit division: 0
Unit division: 0
Unit division: 0
Unit division: 0
Unit division: 0
Unit division: 0
Unit division: 0
Unit division: 0
Unit division: 0
Unit division: 0
Unit division: 0
Unit division: 0
Unit division: 0
Unit division: 0
Unit division: 0
Unit division: 0
Unit division: 0
Unit division: 0

## 2022-08-26 LAB — PREPARE FRESH FROZEN PLASMA
Unit division: 0
Unit division: 0
Unit division: 0
Unit division: 0
Unit division: 0
Unit division: 0
Unit division: 0
Unit division: 0
Unit division: 0
Unit division: 0
Unit division: 0
Unit division: 0
Unit division: 0
Unit division: 0
Unit division: 0
Unit division: 0

## 2022-08-26 LAB — PREPARE CRYOPRECIPITATE
Unit division: 0
Unit division: 0
Unit division: 0
Unit division: 0
Unit division: 0

## 2022-08-26 LAB — BPAM FFP
Blood Product Expiration Date: 202311032359
Blood Product Expiration Date: 202311032359
Blood Product Expiration Date: 202311032359
Blood Product Expiration Date: 202311032359
Blood Product Expiration Date: 202311032359
Blood Product Expiration Date: 202311032359
Blood Product Expiration Date: 202311032359
Blood Product Expiration Date: 202311032359
Blood Product Expiration Date: 202311032359
Blood Product Expiration Date: 202311032359
Blood Product Expiration Date: 202311032359
Blood Product Expiration Date: 202311032359
Blood Product Expiration Date: 202311032359
Blood Product Expiration Date: 202311032359
Blood Product Expiration Date: 202311032359
Blood Product Expiration Date: 202311032359
Blood Product Expiration Date: 202311032359
Blood Product Expiration Date: 202311032359
Blood Product Expiration Date: 202311132359
Blood Product Expiration Date: 202311142359
Blood Product Expiration Date: 202311142359
Blood Product Expiration Date: 202311142359
ISSUE DATE / TIME: 202310290424
ISSUE DATE / TIME: 202310290424
ISSUE DATE / TIME: 202310290500
ISSUE DATE / TIME: 202310290500
ISSUE DATE / TIME: 202310290500
ISSUE DATE / TIME: 202310290500
ISSUE DATE / TIME: 202310300807
ISSUE DATE / TIME: 202310300906
ISSUE DATE / TIME: 202310300950
ISSUE DATE / TIME: 202310300953
ISSUE DATE / TIME: 202310291123
ISSUE DATE / TIME: 202310291123
ISSUE DATE / TIME: 202310291123
ISSUE DATE / TIME: 202310291123
ISSUE DATE / TIME: 202310291253
Unit Type and Rh: 2800
Unit Type and Rh: 2800
Unit Type and Rh: 2800
Unit Type and Rh: 6200
Unit Type and Rh: 6200
Unit Type and Rh: 6200
Unit Type and Rh: 6200
Unit Type and Rh: 6200
Unit Type and Rh: 6200
Unit Type and Rh: 6200
Unit Type and Rh: 6200
Unit Type and Rh: 8400
Unit Type and Rh: 8400
Unit Type and Rh: 8400
Unit Type and Rh: 2800
Unit Type and Rh: 6200
Unit Type and Rh: 6200
Unit Type and Rh: 6200
Unit Type and Rh: 6200
Unit Type and Rh: 8400
Unit Type and Rh: 8400
Unit Type and Rh: 8400

## 2022-08-26 LAB — BPAM RBC
Blood Product Expiration Date: 202311052359
Blood Product Expiration Date: 202311112359
Blood Product Expiration Date: 202311112359
Blood Product Expiration Date: 202311182359
Blood Product Expiration Date: 202311242359
Blood Product Expiration Date: 202311242359
Blood Product Expiration Date: 202311242359
Blood Product Expiration Date: 202311242359
Blood Product Expiration Date: 202311242359
Blood Product Expiration Date: 202311242359
Blood Product Expiration Date: 202311242359
Blood Product Expiration Date: 202311242359
Blood Product Expiration Date: 202311262359
Blood Product Expiration Date: 202311262359
Blood Product Expiration Date: 202311262359
Blood Product Expiration Date: 202311262359
Blood Product Expiration Date: 202311282359
Blood Product Expiration Date: 202311282359
Blood Product Expiration Date: 202311282359
Blood Product Expiration Date: 202311282359
Blood Product Expiration Date: 202311282359
Blood Product Expiration Date: 202312022359
Blood Product Expiration Date: 202312022359
Blood Product Expiration Date: 202312022359
Blood Product Expiration Date: 202312022359
Blood Product Expiration Date: 202312022359
Blood Product Expiration Date: 202311012359
Blood Product Expiration Date: 202311012359
Blood Product Expiration Date: 202311142359
Blood Product Expiration Date: 202311172359
Blood Product Expiration Date: 202311012359
Blood Product Expiration Date: 202311012359
Blood Product Expiration Date: 202311142359
Blood Product Expiration Date: 202311172359
ISSUE DATE / TIME: 202310290325
ISSUE DATE / TIME: 202310290325
ISSUE DATE / TIME: 202310290345
ISSUE DATE / TIME: 202310290345
ISSUE DATE / TIME: 202310290345
ISSUE DATE / TIME: 202310290345
ISSUE DATE / TIME: 202310290415
ISSUE DATE / TIME: 202310290415
ISSUE DATE / TIME: 202310290415
ISSUE DATE / TIME: 202310290415
ISSUE DATE / TIME: 202310290430
ISSUE DATE / TIME: 202310290430
ISSUE DATE / TIME: 202310290430
ISSUE DATE / TIME: 202310290430
ISSUE DATE / TIME: 202310290500
ISSUE DATE / TIME: 202310290500
ISSUE DATE / TIME: 202310290500
ISSUE DATE / TIME: 202310290500
ISSUE DATE / TIME: 202310290550
ISSUE DATE / TIME: 202310290550
ISSUE DATE / TIME: 202310290550
ISSUE DATE / TIME: 202310290550
ISSUE DATE / TIME: 202310290625
ISSUE DATE / TIME: 202310290625
ISSUE DATE / TIME: 202310290625
ISSUE DATE / TIME: 202310290625
Unit Type and Rh: 5100
Unit Type and Rh: 5100
Unit Type and Rh: 5100
Unit Type and Rh: 5100
Unit Type and Rh: 5100
Unit Type and Rh: 5100
Unit Type and Rh: 5100
Unit Type and Rh: 5100
Unit Type and Rh: 5100
Unit Type and Rh: 5100
Unit Type and Rh: 5100
Unit Type and Rh: 5100
Unit Type and Rh: 5100
Unit Type and Rh: 5100
Unit Type and Rh: 5100
Unit Type and Rh: 5100
Unit Type and Rh: 5100
Unit Type and Rh: 5100
Unit Type and Rh: 6200
Unit Type and Rh: 6200
Unit Type and Rh: 6200
Unit Type and Rh: 6200
Unit Type and Rh: 6200
Unit Type and Rh: 6200
Unit Type and Rh: 6200
Unit Type and Rh: 6200
Unit Type and Rh: 600
Unit Type and Rh: 600
Unit Type and Rh: 600
Unit Type and Rh: 600
Unit Type and Rh: 600
Unit Type and Rh: 600
Unit Type and Rh: 600
Unit Type and Rh: 600

## 2022-08-26 LAB — PREPARE PLATELET PHERESIS
Unit division: 0
Unit division: 0
Unit division: 0

## 2022-08-26 LAB — BPAM CRYOPRECIPITATE
Blood Product Expiration Date: 202310291027
Blood Product Expiration Date: 202310291027
Blood Product Expiration Date: 202310291124
Blood Product Expiration Date: 202310291124
Blood Product Expiration Date: 202310291720
ISSUE DATE / TIME: 202310290500
ISSUE DATE / TIME: 202310290500
ISSUE DATE / TIME: 202310290600
ISSUE DATE / TIME: 202310290600
ISSUE DATE / TIME: 202310291145
Unit Type and Rh: 5100
Unit Type and Rh: 5100
Unit Type and Rh: 5100
Unit Type and Rh: 6200
Unit Type and Rh: 6200

## 2022-08-26 LAB — BPAM PLATELET PHERESIS
Blood Product Expiration Date: 202310312359
Blood Product Expiration Date: 202310312359
Blood Product Expiration Date: 202310302359
ISSUE DATE / TIME: 202310290600
ISSUE DATE / TIME: 202310290600
ISSUE DATE / TIME: 202310291132
Unit Type and Rh: 5100
Unit Type and Rh: 6200
Unit Type and Rh: 6200

## 2022-08-28 NOTE — Anesthesia Postprocedure Evaluation (Signed)
Anesthesia Post Note  Patient: [name]  Procedure(s) Performed: IR WITH ANESTHESIA     Patient location during evaluation: ICU Anesthesia Type: General Level of consciousness: sedated and patient remains intubated per anesthesia plan Pain management: pain level controlled Vital Signs Assessment: post-procedure vital signs reviewed and stable Respiratory status: patient remains intubated per anesthesia plan Cardiovascular status: stable Postop Assessment: no apparent nausea or vomiting Anesthetic complications: no   No notable events documented.  Last Vitals:  Vitals:   09/05/2022 0804 09/05/2022 0937  BP:    Pulse:    Resp:    SpO2: 100% 100%    Last Pain: There were no vitals filed for this visit.               Audry Pili

## 2022-08-28 NOTE — ED Provider Notes (Signed)
[facility] EMERGENCY DEPARTMENT Provider Note   CSN: [id number] Arrival date & time: 09-24-2022  0746     History  No chief complaint on file.   [name] is a 61 y.o. male.  With PMH of HTN, COPD, polysubstance abuse alcoholism brought in by EMS as transfer from [facility] as a critically ill patient with concerns for gastric ulcer perforation and concern for development of DIC.  He was excepted by IR for immediate intervention embolization of arterial bleed.  According to EMS, he had presented to [facility] with abdominal pain progressed to have multiple episodes of coffee-ground emesis and melena.  Did have an episode where he coded at least once. ROSC was obtained.  He was given at least 40 blood products and given calcium at outside hospital.  He was intubated and had a right IJ placed as well as femoral Cordis.  On arrival to ED, patient critically ill intubated with active blood coming from mouth and rectal region.  No blood extravasating from IV sites or eyes.  Heart rate in the 120s and blood pressure 130/108.  Satting 100% on ventilator.  He was given dose of calcium chloride and sodium bicarbonate and transferred emergently to IR suite.  HPI     Home Medications Prior to Admission medications   Medication Sig Start Date End Date Taking? Authorizing Provider  aspirin EC 81 MG EC tablet Take 1 tablet (81 mg total) by mouth daily with breakfast. Swallow whole. 11/14/21   Fritzi Mandes, MD  atorvastatin (LIPITOR) 40 MG tablet TAKE 1 TABLET BY MOUTH EVERY DAY 02/15/22   McGowen, Adrian Blackwater, MD  cyclobenzaprine (FLEXERIL) 10 MG tablet TAKE 1 TABLET BY MOUTH THREE TIMES A DAY AS NEEDED 08/01/22   McGowen, Adrian Blackwater, MD  famotidine (PEPCID) 20 MG tablet Take 1 tablet (20 mg total) by mouth daily. 07/31/22 08/30/22  Lucillie Garfinkel, MD  felodipine (PLENDIL) 10 MG 24 hr tablet TAKE 1 TABLET BY MOUTH EVERY DAY 03/07/22   McGowen, Adrian Blackwater, MD  KLOR-CON M20 20 MEQ tablet TAKE 1 TABLET BY  MOUTH EVERY DAY 07/02/22   McGowen, Adrian Blackwater, MD  lisinopril (ZESTRIL) 40 MG tablet Take 1 tablet (40 mg total) by mouth daily. 07/10/22   McGowen, Adrian Blackwater, MD  meloxicam (MOBIC) 15 MG tablet Take 1 tablet (15 mg total) by mouth daily. 07/10/22 07/10/23  McGowen, Adrian Blackwater, MD  ondansetron (ZOFRAN) 4 MG tablet Take 1 tablet (4 mg total) by mouth daily as needed for nausea or vomiting. 07/31/22 07/31/23  Lucillie Garfinkel, MD  ondansetron (ZOFRAN-ODT) 4 MG disintegrating tablet Take 1 tablet (4 mg total) by mouth every 8 (eight) hours as needed for nausea or vomiting. 08/22/22   Blake Divine, MD  oxyCODONE-acetaminophen (PERCOCET) 5-325 MG tablet Take 1 tablet by mouth every 4 (four) hours as needed for severe pain. 08/22/22 08/22/23  Blake Divine, MD  QUEtiapine (SEROQUEL) 400 MG tablet TAKE 1 TABLET BY MOUTH AT BEDTIME. 03/07/22   McGowen, Adrian Blackwater, MD  traZODone (DESYREL) 100 MG tablet TAKE 1 TABLET BY MOUTH EVERYDAY AT BEDTIME 02/15/22   McGowen, Adrian Blackwater, MD  Vilazodone HCl 20 MG TABS TAKE 1 TABLET BY MOUTH EVERY DAY 08/01/22   McGowen, Adrian Blackwater, MD      Allergies    Ambien [zolpidem]    Review of Systems   Review of Systems  Physical Exam Updated Vital Signs BP (!) 130/108   Pulse (!) 46   Resp 19  SpO2 100%  Physical Exam Constitutional: Critically ill-appearing intubated Eyes: No active bleeding ENT      Head: Normocephalic and atraumatic. Cardiovascular: Tachycardic, cool and dry Respiratory: Intubated satting 100% on ventilator,, bright red blood oozing from mouth Gastrointestinal: distended Musculoskeletal: No obvious deformity Neurologic: Intubated Skin: Pale cool and dry Psychiatric: Intubated unresponsive  ED Results / Procedures / Treatments   Labs (all labs ordered are listed, but only abnormal results are displayed) Labs Reviewed  I-STAT CHEM 8, ED - Abnormal; Notable for the following components:      Result Value   Potassium 7.2 (*)    BUN 49 (*)    Creatinine,  Ser 1.80 (*)    Glucose, Bld 255 (*)    Calcium, Ion 0.85 (*)    TCO2 15 (*)    Hemoglobin 12.6 (*)    HCT 37.0 (*)    All other components within normal limits  I-STAT VENOUS BLOOD GAS, ED - Abnormal; Notable for the following components:   pH, Ven 6.877 (*)    Bicarbonate 10.6 (*)    TCO2 12 (*)    Acid-base deficit 23.0 (*)    Potassium 7.3 (*)    Calcium, Ion 0.87 (*)    HCT 36.0 (*)    Hemoglobin 12.2 (*)    All other components within normal limits  TYPE AND SCREEN  PREPARE PLATELET PHERESIS    EKG None  Radiology CT Angio Chest/Abd/Pel for Dissection W and/or Wo Contrast  Result Date: 09-24-2022 CLINICAL DATA:  Hematemesis, unspecified abdominal pain EXAM: CT ANGIOGRAPHY CHEST, ABDOMEN AND PELVIS TECHNIQUE: Non-contrast CT of the chest was initially obtained. Multidetector CT imaging through the chest, abdomen and pelvis was performed using the standard protocol during bolus administration of intravenous contrast. Multiplanar reconstructed images and MIPs were obtained and reviewed to evaluate the vascular anatomy. RADIATION DOSE REDUCTION: This exam was performed according to the departmental dose-optimization program which includes automated exposure control, adjustment of the mA and/or kV according to patient size and/or use of iterative reconstruction technique. CONTRAST:  90mL OMNIPAQUE IOHEXOL 350 MG/ML SOLN COMPARISON:  None Available. FINDINGS: CTA CHEST FINDINGS Cardiovascular: The thoracic aorta is normal in course and caliber. No intramural hematoma, dissection, or aneurysm. Mild atherosclerotic plaque noted throughout the thoracic aorta. Mild coronary artery calcification. Global cardiac size within normal limits. No pericardial effusion. Central pulmonary arteries are of normal caliber. Mediastinum/Nodes: No enlarged mediastinal, hilar, or axillary lymph nodes. Thyroid gland, trachea, and esophagus demonstrate no significant findings. Lungs/Pleura: Right apical wedge  resection noted. Mild paraseptal emphysema. Lungs are otherwise clear. No pneumothorax or pleural effusion. Musculoskeletal: No chest wall abnormality. No acute or significant osseous findings. Review of the MIP images confirms the above findings. CTA ABDOMEN AND PELVIS FINDINGS VASCULAR Aorta: Moderate atherosclerotic calcification. No aortic aneurysm or dissection. No periaortic inflammatory change. Celiac: Celiac axis is widely patent. Normal anatomic configuration. No aneurysm or dissection. The gastroduodenal artery appears segmentally stenosed in its mid segment which may relate to vascular spasm. SMA: Patent without evidence of aneurysm, dissection, vasculitis or significant stenosis. Renals: Both renal arteries are patent without evidence of aneurysm, dissection, vasculitis, fibromuscular dysplasia or significant stenosis. IMA: Patent without evidence of aneurysm, dissection, vasculitis or significant stenosis. Inflow: Patent without evidence of aneurysm, dissection, vasculitis or significant stenosis. Veins: No obvious venous abnormality within the limitations of this arterial phase study. Review of the MIP images confirms the above findings. NON-VASCULAR Hepatobiliary: No focal liver abnormality is seen. No gallstones, gallbladder wall thickening,  or biliary dilatation. Pancreas: Inflammatory changes are noted within the pancreatico duodenal groove which are immediately adjacent to a small defect within the posterior wall of the duodenal bulb, best seen on axial image # 112/5 with inflammatory fluid encompassing the gastroduodenal artery in the area of narrowing suggesting a duodenal ulcer extending into the pancreatico duodenal groove with involvement of the gastroduodenal artery at its base. Inflammatory changes surround the head of the pancreas and second portion of the duodenum. The pancreas is otherwise unremarkable. The pancreatic duct is not dilated. Spleen: Unremarkable Adrenals/Urinary Tract: A 14  mm indeterminate hyperdense lesion is seen within the interpolar region of the right kidney possibly representing a hyperdense renal cyst or solid renal mass. The kidneys are otherwise unremarkable. The adrenal glands demonstrate nodular thickening, but are otherwise unremarkable. Bladder is unremarkable. Stomach/Bowel: Large volume high attenuation material is seen within the gastric lumen which may represent blood product given the history of hematemesis. As noted above, inflammatory changes are seen within the pancreatico duodenal groove with a small defect seen within the posterior wall the duodenal bulb suspicious for and eroding duodenal ulcer. No free intraperitoneal gas is identified. The stomach, small bowel, and large bowel are otherwise unremarkable. Appendix normal. No free intraperitoneal gas or fluid. Lymphatic: No pathologic adenopathy within the abdomen and pelvis. Reproductive: Prostate is unremarkable. Other: Small fat containing left inguinal hernia. Musculoskeletal: Degenerative changes seen within the lumbar spine. No acute bone abnormality. Review of the MIP images confirms the above findings. IMPRESSION: 1. No evidence of thoracoabdominal aortic aneurysm or dissection. 2. Mild coronary artery calcification. 3. Large volume high attenuation material within the gastric lumen which may represent blood product given the history of hematemesis. 4. Inflammatory changes within the pancreatico duodenal groove with a small defect within the posterior wall the duodenal bulb suspicious for an eroding duodenal ulcer. Inflammatory fluid extends posteriorly to encompass the gastroduodenal artery which demonstrates irregular spasm in its mid segment. No free intraperitoneal gas identified. 5. 14 mm indeterminate hyperdense lesion within the interpolar region of the right kidney possibly representing a hyperdense renal cyst or solid renal mass. This could be better assessed with dedicated renal mass protocol  CT or MRI examination. 6. Emphysema. Aortic Atherosclerosis (ICD10-I70.0) and Emphysema (ICD10-J43.9). Electronically Signed   By: Fidela Salisbury M.D.   On: 30-Aug-2022 03:16   DG Chest Port 1 View  Result Date: 2022/08/30 CLINICAL DATA:  Sepsis EXAM: PORTABLE CHEST 1 VIEW COMPARISON:  05/21/2021 FINDINGS: The heart size and mediastinal contours are within normal limits. Both lungs are clear. The visualized skeletal structures are unremarkable. IMPRESSION: No active disease. Electronically Signed   By: Fidela Salisbury M.D.   On: August 30, 2022 01:33    Procedures .Critical Care  Performed by: Elgie Congo, MD Authorized by: Elgie Congo, MD   Critical care provider statement:    Critical care time (minutes):  30   Critical care was necessary to treat or prevent imminent or life-threatening deterioration of the following conditions:  Shock, metabolic crisis and circulatory failure   Critical care was time spent personally by me on the following activities:  Development of treatment plan with patient or surrogate, discussions with consultants, evaluation of patient's response to treatment, examination of patient, ordering and review of laboratory studies, ordering and review of radiographic studies, ordering and performing treatments and interventions, pulse oximetry, re-evaluation of patient's condition, review of old charts, blood draw for specimens and obtaining history from patient or surrogate   Care discussed  with: admitting provider      Medications Ordered in ED Medications  sodium bicarbonate injection (50 mEq Intravenous Given 09-11-22 0745)  calcium chloride injection (1 g Intravenous Given Sep 11, 2022 0834)  gelatin adsorbable (GELFOAM/SURGIFOAM) 12-7 MM sponge 12-7 mm (has no administration in time range)  lidocaine (XYLOCAINE) 1 % (with pres) injection (has no administration in time range)  sodium bicarbonate 150 mEq in dextrose 5 % 1,150 mL infusion (has no administration  in time range)    ED Course/ Medical Decision Making/ A&P                           Medical Decision Making DACEN FRAYRE is a 61 y.o. male.  With PMH of HTN, COPD, polysubstance abuse alcoholism brought in by EMS as transfer from [facility] as a critically ill patient with concerns for gastric ulcer perforation and concern for development of DIC.  He was excepted by IR for immediate intervention embolization of arterial bleed.  Patient arrived critically ill intubated given approximately 40 blood products per EMS actively receiving platelets and PRBC.  On Levophed at 69.  Intubated satting 100%.  Pulse rate in the 120s sinus tachycardia.  Blood pressure 130/108.  Labs were emergently obtained by nursing staff.  He was given a dose of calcium chloride and sodium bicarbonate.  His K was noted to be 7.2 ionized calcium 0.85.  pH 6.877.  Blood products were continued, IR doctor was at bedside.  He was emergently taken to IR suite for intervention once vital signs deemed to be stable.   Risk Prescription drug management.    Final Clinical Impression(s) / ED Diagnoses Final diagnoses:  None    Rx / DC Orders ED Discharge Orders     None         Elgie Congo, MD 09-11-2022 0900

## 2022-08-28 NOTE — Procedures (Signed)
Interventional Radiology Procedure Note  Procedure: Mesenteric Angiogram and GDA embolization  Indication: Acute GI bleed  Findings: Please refer to procedural dictation for full description.  Complications: None  EBL: < 10 mL  Miachel Roux, MD [phone]

## 2022-08-28 NOTE — ED Triage Notes (Signed)
Pt bib ems reports 2 episodes of coffee-ground emesis tonight. Pt endorses he was seen here for same 2 days ago.

## 2022-08-28 NOTE — IPAL (Signed)
  Interdisciplinary Goals of Care Family Meeting   Date carried out: 09/06/22  Location of the meeting: Conference room  Member's involved: Physician, Bedside Registered Nurse, and Family Member or next of kin  Durable Power of Attorney or acting medical decision maker: Hurbert Duran  Discussion: We discussed goals of care for ELIGE SHOUSE .  Unfortunately has suffered significant GI bleeding requiring multiple blood transfusion and then he suffered cardiac arrest for more than 40 minutes.  CT scan showing perforated duodenal ulcer, he underwent embolization of bleeding vessel.  Currently his condition is unstable, he is on multi organ system failure and chances of recovering from this or minimal to none. Patient's daughter would like to have all family member come and visit before she would like to proceed with comfort care as she would not want her father to suffer anymore considering chances of recovery are minimal  Code status: Full DNR  Disposition: Continue current acute care for now until family arrives before proceeding to comfort care   Jacky Kindle, MD  09-06-2022, 11:06 AM

## 2022-08-28 NOTE — Sedation Documentation (Signed)
Angioseal device deployed

## 2022-08-28 NOTE — ED Notes (Signed)
Pt has had 4 episodes of dark red bloody diarrhea

## 2022-08-28 NOTE — ED Provider Notes (Signed)
Procedure Name: Intubation Date/Time: 2022/08/28 4:00 AM  Performed by: Ronelle Smallman, Delice Bison, DOPre-anesthesia Checklist: Patient identified, Patient being monitored, Timeout performed, Emergency Drugs available and Suction available Oxygen Delivery Method: Ambu bag Ventilation: Mask ventilation without difficulty Laryngoscope Size: Glidescope and 4 Grade View: Grade II Tube size: 7.5 mm Number of attempts: 1 Placement Confirmation: ETT inserted through vocal cords under direct vision, CO2 detector, Breath sounds checked- equal and bilateral and Positive ETCO2 Secured at: 25 cm Tube secured with: ETT holder    .Central Line  Date/Time: 2022-08-28 4:00 AM  Performed by: Rathana Viveros, Delice Bison, DO Authorized by: Aalyiah Camberos, Delice Bison, DO   Consent:    Consent obtained:  Emergent situation Universal protocol:    Relevant documents present and verified: yes     Test results available: yes     Imaging studies available: yes     Required blood products, implants, devices, and special equipment available: yes     Site/side marked: yes     Immediately prior to procedure, a time out was called: yes     Patient identity confirmed:  Arm band Pre-procedure details:    Indication(s): central venous access and insufficient peripheral access     Hand hygiene: Hand hygiene performed prior to insertion     Sterile barrier technique: All elements of maximal sterile technique followed     Skin preparation:  Chlorhexidine   Skin preparation agent: Skin preparation agent completely dried prior to procedure   Sedation:    Sedation type:  None Anesthesia:    Anesthesia method:  None Procedure details:    Location:  R femoral   Site selection rationale:  Emergent   Patient position:  Supine   Procedural supplies:  Cordis   Landmarks identified: yes     Ultrasound guidance: yes     Ultrasound guidance timing: real time     Sterile ultrasound techniques: Sterile gel and sterile probe covers were used      Number of attempts:  2   Successful placement: yes   Post-procedure details:    Post-procedure:  Dressing applied and line sutured   Assessment:  Blood return through all ports and free fluid flow   Procedure completion:  Tolerated well, no immediate complications     Kasra Melvin, Delice Bison, DO Aug 28, 2022 0501

## 2022-08-28 NOTE — Anesthesia Procedure Notes (Signed)
Arterial Line Insertion Start/End11/02/23 8:06 AM, 08/29/22 8:09 AM Performed by: Audry Pili, MD, anesthesiologist  Patient location: OR. Preanesthetic checklist: patient identified, IV checked, monitors and equipment checked, pre-op evaluation and timeout performed Emergency situation Patient sedated Left, radial was placed Catheter size: 20 G Hand hygiene performed   Attempts: 1 Procedure performed without using ultrasound guided technique. Following insertion, dressing applied and Biopatch. Post procedure assessment: unchanged and normal  Patient tolerated the procedure well with no immediate complications.

## 2022-08-28 NOTE — Hospital Course (Addendum)
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  On his first visit he complained of epigastric pain associated with nausea and vomiting.  RUQ ultrasound showed gallstones without cholecystitis and he was discharged with a diagnosis of biliary colic.  10 night, he was brought in by EMS after having 2 episodes of coffee-ground emesis and reports continued epigastric pain.  Systolic with EMS was in the 80s.  During work-up in the ED he went on to have 4 bloody bowel movements.  Patient denies fever or chills and denies affected contacts. ED course and data review: BP 91/59, dropping as low as 66/56, fluid responsive to 92/66, pulse 81-91.  Hemoglobin 10.9, down from 14 3 days prior on 10/26.  WBC 19,000 with lactic acid 2.5.  Creatinine 1.93, up from baseline of 0.88.  EKG, personally viewed and interpreted showing sinus at 93 with no acute ST-T wave changes.  Chest x-ray shows no active disease.  CTA chest abdomen and pelvis showing the following findings: IMPRESSION: 1. No evidence of thoracoabdominal aortic aneurysm or dissection. 2. Mild coronary artery calcification. 3. Large volume high attenuation material within the gastric lumen which may represent blood product given the history of hematemesis. 4. Inflammatory changes within the pancreatico duodenal groove with a small defect within the posterior wall the duodenal bulb suspicious for an eroding duodenal ulcer. Inflammatory fluid extends posteriorly to encompass the gastroduodenal artery which demonstrates irregular spasm in its mid segment. No free intraperitoneal gas identified. 5. 14 mm indeterminate hyperdense lesion within the interpolar region of the right kidney possibly representing a hyperdense renal cyst or solid renal  mass. This could be better assessed with dedicated renal mass protocol CT or MRI examination. 6. Emphysema.   Patient given a 2 L sodium chloride bolus, IV Protonix ,octreotide,cefepime.  Levophed initiated  Hospitalist consulted for admission.Marland Kitchen

## 2022-08-28 NOTE — Progress Notes (Addendum)
Received room 18M room 10, called RT [id number], called report to [place] at [phone] by anesthesia.

## 2022-08-28 NOTE — Anesthesia Preprocedure Evaluation (Addendum)
Anesthesia Evaluation  Patient identified by MRN, date of birth, ID band Patient unresponsive    Reviewed: Allergy & Precautions, Patient's Chart, lab work & pertinent test results, Unable to perform ROS - Chart review onlyPreop documentation limited or incomplete due to emergent nature of procedure.  History of Anesthesia Complications Negative for: history of anesthetic complications  Airway Mallampati: Intubated       Dental   Pulmonary COPD, Current Smoker    + decreased breath sounds  + intubated    Cardiovascular hypertension, Pt. on medications  Rhythm:Regular Rate:Tachycardia     Neuro/Psych  Headaches PSYCHIATRIC DISORDERS Anxiety Depression     SDH Spastic hemiparesis  TIACVA    GI/Hepatic PUD,,,(+)     substance abuse  alcohol use and marijuana use GIB    Endo/Other    Renal/GU Renal disease     Musculoskeletal  (+) Arthritis ,    Abdominal   Peds  Hematology  (+) Blood dyscrasia, anemia   Anesthesia Other Findings   Reproductive/Obstetrics                             Anesthesia Physical Anesthesia Plan  ASA: 5 and emergent  Anesthesia Plan: General   Post-op Pain Management: Minimal or no pain anticipated   Induction: Inhalational  PONV Risk Score and Plan: 1 and Treatment may vary due to age or medical condition  Airway Management Planned: Oral ETT  Additional Equipment: Arterial line and CVP  Intra-op Plan:   Post-operative Plan: Post-operative intubation/ventilation  Informed Consent:      History available from chart only and Only emergency history available  Plan Discussed with: CRNA and Anesthesiologist  Anesthesia Plan Comments:         Anesthesia Quick Evaluation

## 2022-08-28 NOTE — Sedation Documentation (Signed)
Bedside handoff with RN Jacqulyn Bath, right leg restrictions: no flexion, HOB flat x 6 hrs orders to follow.

## 2022-08-28 NOTE — Addendum Note (Signed)
Addendum  created 15-Sep-2022 2133 by Audry Pili, MD   Child order released for a procedure order, Clinical Note Signed, Intraprocedure Blocks edited, LDA created via procedure documentation, SmartForm saved

## 2022-08-28 NOTE — Consult Note (Signed)
HPI:  The patient has had a H&P performed within the last 30 days, all history, medications, and exam have been reviewed.   I could not obtain additional history from the patient as he is intubated and sedated.  Medications: Prior to Admission medications   Medication Sig Start Date End Date Taking? Authorizing Provider  aspirin EC 81 MG EC tablet Take 1 tablet (81 mg total) by mouth daily with breakfast. Swallow whole. 11/14/21   Fritzi Mandes, MD  atorvastatin (LIPITOR) 40 MG tablet TAKE 1 TABLET BY MOUTH EVERY DAY 02/15/22   McGowen, Adrian Blackwater, MD  cyclobenzaprine (FLEXERIL) 10 MG tablet TAKE 1 TABLET BY MOUTH THREE TIMES A DAY AS NEEDED 08/01/22   McGowen, Adrian Blackwater, MD  famotidine (PEPCID) 20 MG tablet Take 1 tablet (20 mg total) by mouth daily. 07/31/22 08/30/22  Lucillie Garfinkel, MD  felodipine (PLENDIL) 10 MG 24 hr tablet TAKE 1 TABLET BY MOUTH EVERY DAY 03/07/22   McGowen, Adrian Blackwater, MD  KLOR-CON M20 20 MEQ tablet TAKE 1 TABLET BY MOUTH EVERY DAY 07/02/22   McGowen, Adrian Blackwater, MD  lisinopril (ZESTRIL) 40 MG tablet Take 1 tablet (40 mg total) by mouth daily. 07/10/22   McGowen, Adrian Blackwater, MD  meloxicam (MOBIC) 15 MG tablet Take 1 tablet (15 mg total) by mouth daily. 07/10/22 07/10/23  McGowen, Adrian Blackwater, MD  ondansetron (ZOFRAN) 4 MG tablet Take 1 tablet (4 mg total) by mouth daily as needed for nausea or vomiting. 07/31/22 07/31/23  Lucillie Garfinkel, MD  ondansetron (ZOFRAN-ODT) 4 MG disintegrating tablet Take 1 tablet (4 mg total) by mouth every 8 (eight) hours as needed for nausea or vomiting. 08/22/22   Blake Divine, MD  oxyCODONE-acetaminophen (PERCOCET) 5-325 MG tablet Take 1 tablet by mouth every 4 (four) hours as needed for severe pain. 08/22/22 08/22/23  Blake Divine, MD  QUEtiapine (SEROQUEL) 400 MG tablet TAKE 1 TABLET BY MOUTH AT BEDTIME. 03/07/22   McGowen, Adrian Blackwater, MD  traZODone (DESYREL) 100 MG tablet TAKE 1 TABLET BY MOUTH EVERYDAY AT BEDTIME 02/15/22   McGowen, Adrian Blackwater, MD  Vilazodone HCl 20  MG TABS TAKE 1 TABLET BY MOUTH EVERY DAY 08/01/22   McGowen, Adrian Blackwater, MD     Vital Signs: BP (!) 130/108   Pulse (!) 46   Resp 19   SpO2 100%   Physical Exam  Mallampati Score:  Unable to assess due to intubation.    Labs:  CBC: Recent Labs    07/31/22 1758 08/22/22 1938 08-28-2022 0117 08/28/2022 0527 Aug 28, 2022 0610 08/28/22 0753 2022/08/28 0754  WBC 11.3* 12.4* 18.9* 9.8  --   --   --   HGB 11.9* 14.1 10.9* 13.5  --  12.2* 12.6*  HCT 36.1* 43.2 34.3* 41.7  --  36.0* 37.0*  PLT 349 473* 371 155 160  --   --     COAGS: Recent Labs    11/12/21 1846 2022-08-28 0117 08/28/2022 0610  INR 0.9 1.1 1.7*  APTT 31 29 60*    BMP: Recent Labs    11/13/21 0406 02/15/22 1007 07/10/22 1512 07/31/22 1758 08/22/22 1938 2022/08/28 0117 2022/08/28 0753 08-28-22 0754  NA 138   < > 140 141 137 139 138 138  K 4.0   < > 3.7 3.6 3.8 5.1 7.3* 7.2*  CL 105   < > 102 107 105 105  --  110  CO2 27   < > 28 22 24 22   --   --   GLUCOSE  123*   < > 105* 110* 113* 86  --  255*  BUN 12   < > 11 34* 13 39*  --  49*  CALCIUM 8.7*   < > 9.7 8.8* 9.6 8.8*  --   --   CREATININE 0.87   < > 1.00 1.14 0.84 1.93*  --  1.80*  GFRNONAA >60  --   --  >60 >60 38*  --   --    < > = values in this interval not displayed.    LIVER FUNCTION TESTS: Recent Labs    11/12/21 1846 07/31/22 1758 08/22/22 1938 09-11-2022 0117  BILITOT 0.4 0.9 0.7 1.1  AST 18 21 16 27   ALT 15 26 13 16   ALKPHOS 96 125 107 90  PROT 6.9 6.9 8.4* 6.9  ALBUMIN 3.9 3.4* 4.2 3.3*    Assessment/Plan:  61 year old man with acute life threatening upper GI bleed.  IR consulted for emergent angiogram and possible embolization.  I discussed the procedure with his daughter.   Risks and benefits of mesenteric angiogram and embolization were discussed with the patient's daughter [name]  including, but not limited to bleeding, infection, vascular injury or contrast induced renal failure.  All of the patient's daughter's questions were  answered, patient is agreeable to proceed.  Consent signed and in chart.  SignedPaula Libra Destenee Guerry 2022/09/11, 8:13 AM

## 2022-08-28 NOTE — Progress Notes (Signed)
PHARMACY -  BRIEF ANTIBIOTIC NOTE   Pharmacy has received consult(s) for vancomycin from an ED provider.  The patient's profile has been reviewed for ht/wt/allergies/indication/available labs.    One time order(s) placed for vancomycin 2,000 mg x 1  Further antibiotics/pharmacy consults should be ordered by admitting physician if indicated.                       Thank you, Wynelle Cleveland 06-Sep-2022  2:09 AM

## 2022-08-28 NOTE — Death Summary Note (Signed)
DEATH SUMMARY   Patient Details  Name: [name] MRN: [id number] DOB: 05-31-1961  Admission/Discharge Information   Admit Date:  09/06/22  Date of Death: Date of Death: 06-Sep-2022  Time of Death: Time of Death: 01/24/08  Length of Stay: 0  Referring Physician: Tammi Sou, MD   Reason(s) for Hospitalization  Hemorrhagic shock due to upper GI bleeding status post MTP and GDA embolization Perforated duodenal ulcer Acute blood loss anemia In the hospital PEA cardiac arrest in the setting of hemorrhagic shock Acute encephalopathy, multifactorial metabolic due to multisystem organ failure and anoxic due to cardiac arrest Acute hypoxic/hypercapnic respiratory failure Acute renal failure Hyperkalemia Hypocalcemia Severe metabolic acidosis/lactic acidosis Acute respiratory acidosis Shock liver Alcohol abuse/polysubstance abuse  Diagnoses  Preliminary cause of death:   Withdrawal of care in the setting of multisystem organ failure Secondary Diagnoses (including complications and co-morbidities):  Principal Problem:   GI bleed (coffee-ground emesis and hematochezia) Active Problems:   Metabolic acidosis   Brief Hospital Course (including significant findings, care, treatment, and services provided and events leading to death)  [name] is a 61 y.o. year old male who presented to the [facility] emergency department on 09/07/2023 with complaints of coffee-ground emesis   They have a pertinent past medical history of hypertension, stroke with left-sided deficits, SDH, COPD, EtOH abuse, polysubstance abuse   While in the [facility] ED he developed five episodes of melena.  After bowel movement he developed dizziness, became apneic and pulseless.  In hospital CPR initiated-PEA.  ROSC was achieved after 32minutes.  He was intubated. MTP was initiated.  At time of transfer patient received 22 units of PRBCs, 1 of cryo, 1 of FFP, 1 of platelets.  CT angio chest abdomen pelvis concerning for area  suspicious for guarding duodenal ulcer.   Dr. Jacelyn Grip spoke with Dr. Verl Blalock of GI and Dr. Christian Mate of general surgery-recommendations were made for IR intervention.  PCCM was consulted for transfer for IR intervention and admission.  Patient was transferred to [facility] emergency department, from where he was transferred to IR suite, had gastroduodenal artery embolization, after that patient was transferred to ICU. In ICU patient was noted to be hypotensive requiring multiple vasopressor support, as CT scan showed perforated duodenal ulcer, general surgery was consulted, they recommend watchful waiting considering patient is in severe shock with multisystem organ failure.  Labs were repeated which showed severe coagulopathy, acute renal failure due to ischemic ATN, shock liver, severe metabolic acidosis.  Goals of care discussions were carried with family, considering patient was in multisystem organ failure, chances of meaningful recovery were almost minimal, patient's family decided to proceed with comfort care and palliative extubation.  Comfort care orders were written, patient was declared dead on 06-Sep-2022 at 1609.  Patient family was at bedside    Pertinent Labs and Studies  Significant Diagnostic Studies DG Abd 1 View  Result Date: 2022-09-06 CLINICAL DATA:  Nasal/orogastric tube placement. Abdominal distension. EXAM: ABDOMEN - 1 VIEW COMPARISON:  07/28/2007. FINDINGS: Nasal/orogastric tube passes well below the diaphragm, tip in the left mid abdomen within the mid to distal stomach. Paucity of bowel gas. Embolization coils noted in the right mid to upper abdomen. IMPRESSION: 1. Well-positioned nasal/orogastric tube. Electronically Signed   By: Lajean Manes M.D.   On: Sep 06, 2022 10:53   DG Chest Port 1 View  Result Date: 2022-09-06 CLINICAL DATA:  Intubated patient. EXAM: PORTABLE CHEST 1 VIEW COMPARISON:  Sep 06, 2022 at 1:20 a.m. FINDINGS: Endotracheal tube tip lies  1 cm above the carina.  Nasal/orogastric tube passes at least to the distal esophagus, likely below the diaphragm. Right internal jugular central venous line has its tip in the lower superior vena cava. Cardiac silhouette is normal in size. Contour bulge noted along the aortic knob to the left hilum crossing the AP window, presumably vascular accentuated by patient rotation to the right. Low lung volumes. Mild interstitial prominence. No convincing pneumonia or pulmonary edema. No pleural effusion. No pneumothorax. IMPRESSION: 1. No acute cardiopulmonary disease. 2. Support apparatus well positioned as detailed. Electronically Signed   By: Lajean Manes M.D.   On: 09-12-2022 10:52   IR EMBO ART  VEN HEMORR LYMPH EXTRAV  INC GUIDE ROADMAPPING  Result Date: 09-12-2022 INDICATION: 61 year old gentleman with acute life-threatening upper GI bleeding. CTA demonstrates irregularity of the gastro duodenal artery suspicious for vaso spasm, blood products within the gastric lumen, and possible gastric ulcer. EXAM: 1. Ultrasound-guided access of right common femoral artery 2. Celiac angiogram 3. Gastroduodenal artery angiogram and embolization 4. Superior mesenteric angiogram 5. Angio-Seal closure of right common femoral artery access MEDICATIONS: Please see anesthesia notes for details ANESTHESIA/SEDATION: Please see anesthesia notes for details CONTRAST:  55 mL of Omnipaque 300 intra-arterial FLUOROSCOPY: Radiation Exposure Index (as provided by the fluoroscopic device): 626 mGy Kerma COMPLICATIONS: None immediate. PROCEDURE: Informed consent was obtained from the patient following explanation of the procedure, risks, benefits and alternatives. The patient understands, agrees and consents for the procedure. All questions were addressed. A time out was performed prior to the initiation of the procedure. Maximal barrier sterile technique utilized including caps, mask, sterile gowns, sterile gloves, large sterile drape, hand hygiene, and Betadine  prep. Ultrasound image documenting patency of the right common femoral artery was obtained and placed in permanent medical record. Sterile ultrasound probe cover and gel utilized throughout the procedure. Utilizing continuous ultrasound guidance, the right common femoral artery was accessed at the level of the femoral head with a 21 gauge needle. 21 gauge needle exchanged for a transitional dilator set over 0.018 inch guidewire. Transitional dilator set exchanged for 5 French sheath over 0.035 inch guidewire. Celiac artery selected with the S1 catheter. Celiac angiogram demonstrated patent splenic and common hepatic arteries and active extravasation from the gastro duodenal artery into a pseudoaneurysm. Gastro duodenal artery was selected with lantern microcatheter and coil embolization was performed with a combination of 2 mm, 3 mm, and 4 mm Ruby coils and 2 mm Interlock coils. Post embolization angiogram performed from celiac artery showed no additional extravasation in the GDA pseudoaneurysm. Superior mesenteric artery was selected and angiogram was performed. No active extravasation was identified. Sheath angiogram demonstrated appropriate puncture at the level of the femoral head. The groin was re-prepped and draped with chlorhexidine in closure was performed with 6 [place] Angio-Seal device. IMPRESSION: Mesenteric angiogram demonstrates active extravasation into a pseudoaneurysm originating from the [place]. Coil embolization GDA was performed. No active extravasation seen from celiac or superior mesenteric arteries following embolization. Electronically Signed   By: Miachel Roux M.D.   On: 09-12-22 10:00   IR US Guide Vasc Access Right  Result Date: September 12, 2022 INDICATION: 61 year old gentleman with acute life-threatening upper GI bleeding. CTA demonstrates irregularity of the gastro duodenal artery suspicious for vaso spasm, blood products within the gastric lumen, and possible gastric ulcer. EXAM: 1.  Ultrasound-guided access of right common femoral artery 2. Celiac angiogram 3. Gastroduodenal artery angiogram and embolization 4. Superior mesenteric angiogram 5. Angio-Seal closure of right common femoral artery access  MEDICATIONS: Please see anesthesia notes for details ANESTHESIA/SEDATION: Please see anesthesia notes for details CONTRAST:  55 mL of Omnipaque 300 intra-arterial FLUOROSCOPY: Radiation Exposure Index (as provided by the fluoroscopic device): 454 mGy Kerma COMPLICATIONS: None immediate. PROCEDURE: Informed consent was obtained from the patient following explanation of the procedure, risks, benefits and alternatives. The patient understands, agrees and consents for the procedure. All questions were addressed. A time out was performed prior to the initiation of the procedure. Maximal barrier sterile technique utilized including caps, mask, sterile gowns, sterile gloves, large sterile drape, hand hygiene, and Betadine prep. Ultrasound image documenting patency of the right common femoral artery was obtained and placed in permanent medical record. Sterile ultrasound probe cover and gel utilized throughout the procedure. Utilizing continuous ultrasound guidance, the right common femoral artery was accessed at the level of the femoral head with a 21 gauge needle. 21 gauge needle exchanged for a transitional dilator set over 0.018 inch guidewire. Transitional dilator set exchanged for 5 French sheath over 0.035 inch guidewire. Celiac artery selected with the S1 catheter. Celiac angiogram demonstrated patent splenic and common hepatic arteries and active extravasation from the gastro duodenal artery into a pseudoaneurysm. Gastro duodenal artery was selected with lantern microcatheter and coil embolization was performed with a combination of 2 mm, 3 mm, and 4 mm Ruby coils and 2 mm Interlock coils. Post embolization angiogram performed from celiac artery showed no additional extravasation in the GDA  pseudoaneurysm. Superior mesenteric artery was selected and angiogram was performed. No active extravasation was identified. Sheath angiogram demonstrated appropriate puncture at the level of the femoral head. The groin was re-prepped and draped with chlorhexidine in closure was performed with 6 [place] Angio-Seal device. IMPRESSION: Mesenteric angiogram demonstrates active extravasation into a pseudoaneurysm originating from the [place]. Coil embolization GDA was performed. No active extravasation seen from celiac or superior mesenteric arteries following embolization. Electronically Signed   By: Miachel Roux M.D.   On: September 07, 2022 10:00   IR Angiogram Visceral Selective  Result Date: 09-07-2022 INDICATION: 61 year old gentleman with acute life-threatening upper GI bleeding. CTA demonstrates irregularity of the gastro duodenal artery suspicious for vaso spasm, blood products within the gastric lumen, and possible gastric ulcer. EXAM: 1. Ultrasound-guided access of right common femoral artery 2. Celiac angiogram 3. Gastroduodenal artery angiogram and embolization 4. Superior mesenteric angiogram 5. Angio-Seal closure of right common femoral artery access MEDICATIONS: Please see anesthesia notes for details ANESTHESIA/SEDATION: Please see anesthesia notes for details CONTRAST:  55 mL of Omnipaque 300 intra-arterial FLUOROSCOPY: Radiation Exposure Index (as provided by the fluoroscopic device): 098 mGy Kerma COMPLICATIONS: None immediate. PROCEDURE: Informed consent was obtained from the patient following explanation of the procedure, risks, benefits and alternatives. The patient understands, agrees and consents for the procedure. All questions were addressed. A time out was performed prior to the initiation of the procedure. Maximal barrier sterile technique utilized including caps, mask, sterile gowns, sterile gloves, large sterile drape, hand hygiene, and Betadine prep. Ultrasound image documenting patency of the  right common femoral artery was obtained and placed in permanent medical record. Sterile ultrasound probe cover and gel utilized throughout the procedure. Utilizing continuous ultrasound guidance, the right common femoral artery was accessed at the level of the femoral head with a 21 gauge needle. 21 gauge needle exchanged for a transitional dilator set over 0.018 inch guidewire. Transitional dilator set exchanged for 5 French sheath over 0.035 inch guidewire. Celiac artery selected with the S1 catheter. Celiac angiogram demonstrated patent splenic and common  hepatic arteries and active extravasation from the gastro duodenal artery into a pseudoaneurysm. Gastro duodenal artery was selected with lantern microcatheter and coil embolization was performed with a combination of 2 mm, 3 mm, and 4 mm Ruby coils and 2 mm Interlock coils. Post embolization angiogram performed from celiac artery showed no additional extravasation in the GDA pseudoaneurysm. Superior mesenteric artery was selected and angiogram was performed. No active extravasation was identified. Sheath angiogram demonstrated appropriate puncture at the level of the femoral head. The groin was re-prepped and draped with chlorhexidine in closure was performed with 6 [place] Angio-Seal device. IMPRESSION: Mesenteric angiogram demonstrates active extravasation into a pseudoaneurysm originating from the [place]. Coil embolization GDA was performed. No active extravasation seen from celiac or superior mesenteric arteries following embolization. Electronically Signed   By: Miachel Roux M.D.   On: 09-04-22 10:00   IR Angiogram Visceral Selective  Result Date: 09-04-22 INDICATION: 61 year old gentleman with acute life-threatening upper GI bleeding. CTA demonstrates irregularity of the gastro duodenal artery suspicious for vaso spasm, blood products within the gastric lumen, and possible gastric ulcer. EXAM: 1. Ultrasound-guided access of right common femoral  artery 2. Celiac angiogram 3. Gastroduodenal artery angiogram and embolization 4. Superior mesenteric angiogram 5. Angio-Seal closure of right common femoral artery access MEDICATIONS: Please see anesthesia notes for details ANESTHESIA/SEDATION: Please see anesthesia notes for details CONTRAST:  55 mL of Omnipaque 300 intra-arterial FLUOROSCOPY: Radiation Exposure Index (as provided by the fluoroscopic device): 937 mGy Kerma COMPLICATIONS: None immediate. PROCEDURE: Informed consent was obtained from the patient following explanation of the procedure, risks, benefits and alternatives. The patient understands, agrees and consents for the procedure. All questions were addressed. A time out was performed prior to the initiation of the procedure. Maximal barrier sterile technique utilized including caps, mask, sterile gowns, sterile gloves, large sterile drape, hand hygiene, and Betadine prep. Ultrasound image documenting patency of the right common femoral artery was obtained and placed in permanent medical record. Sterile ultrasound probe cover and gel utilized throughout the procedure. Utilizing continuous ultrasound guidance, the right common femoral artery was accessed at the level of the femoral head with a 21 gauge needle. 21 gauge needle exchanged for a transitional dilator set over 0.018 inch guidewire. Transitional dilator set exchanged for 5 French sheath over 0.035 inch guidewire. Celiac artery selected with the S1 catheter. Celiac angiogram demonstrated patent splenic and common hepatic arteries and active extravasation from the gastro duodenal artery into a pseudoaneurysm. Gastro duodenal artery was selected with lantern microcatheter and coil embolization was performed with a combination of 2 mm, 3 mm, and 4 mm Ruby coils and 2 mm Interlock coils. Post embolization angiogram performed from celiac artery showed no additional extravasation in the GDA pseudoaneurysm. Superior mesenteric artery was selected  and angiogram was performed. No active extravasation was identified. Sheath angiogram demonstrated appropriate puncture at the level of the femoral head. The groin was re-prepped and draped with chlorhexidine in closure was performed with 6 [place] Angio-Seal device. IMPRESSION: Mesenteric angiogram demonstrates active extravasation into a pseudoaneurysm originating from the [place]. Coil embolization GDA was performed. No active extravasation seen from celiac or superior mesenteric arteries following embolization. Electronically Signed   By: Miachel Roux M.D.   On: Sep 04, 2022 10:00   CT Angio Chest/Abd/Pel for Dissection W and/or Wo Contrast  Result Date: 09/04/22 CLINICAL DATA:  Hematemesis, unspecified abdominal pain EXAM: CT ANGIOGRAPHY CHEST, ABDOMEN AND PELVIS TECHNIQUE: Non-contrast CT of the chest was initially obtained. Multidetector CT imaging through the chest,  abdomen and pelvis was performed using the standard protocol during bolus administration of intravenous contrast. Multiplanar reconstructed images and MIPs were obtained and reviewed to evaluate the vascular anatomy. RADIATION DOSE REDUCTION: This exam was performed according to the departmental dose-optimization program which includes automated exposure control, adjustment of the mA and/or kV according to patient size and/or use of iterative reconstruction technique. CONTRAST:  74mL OMNIPAQUE IOHEXOL 350 MG/ML SOLN COMPARISON:  None Available. FINDINGS: CTA CHEST FINDINGS Cardiovascular: The thoracic aorta is normal in course and caliber. No intramural hematoma, dissection, or aneurysm. Mild atherosclerotic plaque noted throughout the thoracic aorta. Mild coronary artery calcification. Global cardiac size within normal limits. No pericardial effusion. Central pulmonary arteries are of normal caliber. Mediastinum/Nodes: No enlarged mediastinal, hilar, or axillary lymph nodes. Thyroid gland, trachea, and esophagus demonstrate no significant  findings. Lungs/Pleura: Right apical wedge resection noted. Mild paraseptal emphysema. Lungs are otherwise clear. No pneumothorax or pleural effusion. Musculoskeletal: No chest wall abnormality. No acute or significant osseous findings. Review of the MIP images confirms the above findings. CTA ABDOMEN AND PELVIS FINDINGS VASCULAR Aorta: Moderate atherosclerotic calcification. No aortic aneurysm or dissection. No periaortic inflammatory change. Celiac: Celiac axis is widely patent. Normal anatomic configuration. No aneurysm or dissection. The gastroduodenal artery appears segmentally stenosed in its mid segment which may relate to vascular spasm. SMA: Patent without evidence of aneurysm, dissection, vasculitis or significant stenosis. Renals: Both renal arteries are patent without evidence of aneurysm, dissection, vasculitis, fibromuscular dysplasia or significant stenosis. IMA: Patent without evidence of aneurysm, dissection, vasculitis or significant stenosis. Inflow: Patent without evidence of aneurysm, dissection, vasculitis or significant stenosis. Veins: No obvious venous abnormality within the limitations of this arterial phase study. Review of the MIP images confirms the above findings. NON-VASCULAR Hepatobiliary: No focal liver abnormality is seen. No gallstones, gallbladder wall thickening, or biliary dilatation. Pancreas: Inflammatory changes are noted within the pancreatico duodenal groove which are immediately adjacent to a small defect within the posterior wall of the duodenal bulb, best seen on axial image # 112/5 with inflammatory fluid encompassing the gastroduodenal artery in the area of narrowing suggesting a duodenal ulcer extending into the pancreatico duodenal groove with involvement of the gastroduodenal artery at its base. Inflammatory changes surround the head of the pancreas and second portion of the duodenum. The pancreas is otherwise unremarkable. The pancreatic duct is not dilated.  Spleen: Unremarkable Adrenals/Urinary Tract: A 14 mm indeterminate hyperdense lesion is seen within the interpolar region of the right kidney possibly representing a hyperdense renal cyst or solid renal mass. The kidneys are otherwise unremarkable. The adrenal glands demonstrate nodular thickening, but are otherwise unremarkable. Bladder is unremarkable. Stomach/Bowel: Large volume high attenuation material is seen within the gastric lumen which may represent blood product given the history of hematemesis. As noted above, inflammatory changes are seen within the pancreatico duodenal groove with a small defect seen within the posterior wall the duodenal bulb suspicious for and eroding duodenal ulcer. No free intraperitoneal gas is identified. The stomach, small bowel, and large bowel are otherwise unremarkable. Appendix normal. No free intraperitoneal gas or fluid. Lymphatic: No pathologic adenopathy within the abdomen and pelvis. Reproductive: Prostate is unremarkable. Other: Small fat containing left inguinal hernia. Musculoskeletal: Degenerative changes seen within the lumbar spine. No acute bone abnormality. Review of the MIP images confirms the above findings. IMPRESSION: 1. No evidence of thoracoabdominal aortic aneurysm or dissection. 2. Mild coronary artery calcification. 3. Large volume high attenuation material within the gastric lumen which may represent blood  product given the history of hematemesis. 4. Inflammatory changes within the pancreatico duodenal groove with a small defect within the posterior wall the duodenal bulb suspicious for an eroding duodenal ulcer. Inflammatory fluid extends posteriorly to encompass the gastroduodenal artery which demonstrates irregular spasm in its mid segment. No free intraperitoneal gas identified. 5. 14 mm indeterminate hyperdense lesion within the interpolar region of the right kidney possibly representing a hyperdense renal cyst or solid renal mass. This could be  better assessed with dedicated renal mass protocol CT or MRI examination. 6. Emphysema. Aortic Atherosclerosis (ICD10-I70.0) and Emphysema (ICD10-J43.9). Electronically Signed   By: Fidela Salisbury M.D.   On: 2022/09/16 03:16   DG Chest Port 1 View  Result Date: 16-Sep-2022 CLINICAL DATA:  Sepsis EXAM: PORTABLE CHEST 1 VIEW COMPARISON:  05/21/2021 FINDINGS: The heart size and mediastinal contours are within normal limits. Both lungs are clear. The visualized skeletal structures are unremarkable. IMPRESSION: No active disease. Electronically Signed   By: Fidela Salisbury M.D.   On: September 16, 2022 01:33   US Abdomen Limited RUQ (LIVER/GB)  Result Date: 08/22/2022 CLINICAL DATA:  Epigastric pain. EXAM: ULTRASOUND ABDOMEN LIMITED RIGHT UPPER QUADRANT COMPARISON:  CT abdomen pelvis dated 07/31/2022. FINDINGS: Gallbladder: Multiple stones within the gallbladder. No gallbladder wall thickening or pericholecystic fluid negative sonographic Murphy's sign. Common bile duct: Diameter: 9 mm Liver: The liver is unremarkable. Portal vein is patent on color Doppler imaging with normal direction of blood flow towards the liver. Other: None. IMPRESSION: Cholelithiasis without sonographic evidence of acute cholecystitis. Electronically Signed   By: Anner Crete M.D.   On: 08/22/2022 21:10   CT Abdomen Pelvis W Contrast  Result Date: 07/31/2022 CLINICAL DATA:  Nausea and vomiting with abdominal pain EXAM: CT ABDOMEN AND PELVIS WITH CONTRAST TECHNIQUE: Multidetector CT imaging of the abdomen and pelvis was performed using the standard protocol following bolus administration of intravenous contrast. RADIATION DOSE REDUCTION: This exam was performed according to the departmental dose-optimization program which includes automated exposure control, adjustment of the mA and/or kV according to patient size and/or use of iterative reconstruction technique. CONTRAST:  179mL OMNIPAQUE IOHEXOL 300 MG/ML  SOLN COMPARISON:  None  Available. FINDINGS: Lower chest: No acute abnormality. Hepatobiliary: Liver is within normal limits. Gallbladder is well distended. Very minimal prominent central ductal dilatation is noted. No discrete obstructing lesion is noted. Pancreas: Unremarkable. No pancreatic ductal dilatation or surrounding inflammatory changes. Spleen: Normal in size without focal abnormality. Adrenals/Urinary Tract: Adrenal glands show the right adrenal gland to be within normal limits. Stable hypodense lesion is noted in the left adrenal glands unchanged from the prior exam consistent with a benign adenoma. No follow-up is recommended. Kidneys show normal enhancement pattern bilaterally. No renal calculi are seen. Small exophytic hyperdense lesions are noted bilaterally. The largest of these is noted in the right kidney measuring 12 mm best seen on image number 27 of series 2. This likely represents a hyperdense cyst. No obstructive changes are seen. The bladder is well distended. Stomach/Bowel: Scattered diverticular changes noted without evidence of diverticulitis. The appendix is well visualized and within normal limits. Small bowel and stomach are unremarkable. Vascular/Lymphatic: Aortic atherosclerosis. No enlarged abdominal or pelvic lymph nodes. Reproductive: Prostate is unremarkable. Other: No abdominal ascites is noted. Small fat containing left inguinal hernia is seen. Musculoskeletal: No acute or significant osseous findings. IMPRESSION: Diverticulosis without diverticulitis. Minimal central biliary ductal dilatation without obstructing lesion. Correlate with laboratory values. Stable left adrenal nodule unchanged from 2008 consistent with benign adenoma. No  further follow-up is recommended. Exophytic hyperdense lesions in the kidneys bilaterally slightly larger on the right than the left. These likely represent hyperdense cysts. Short-term follow-up in 6-12 months with ultrasound of the kidney is recommended for further  evaluation. Electronically Signed   By: Inez Catalina M.D.   On: 07/31/2022 19:23    Microbiology Recent Results (from the past 240 hour(s))  Blood Culture (routine x 2)     Status: None (Preliminary result)   Collection Time: 09-05-22  1:17 AM   Specimen: BLOOD RIGHT ARM  Result Value Ref Range Status   Specimen Description BLOOD RIGHT ARM  Final   Special Requests   Final    Blood Culture results may not be optimal due to an inadequate volume of blood received in culture bottles   Culture   Final    NO GROWTH < 12 HOURS Performed at [facility], [address] [place], [place] [address]    Report Status PENDING  Incomplete    Lab Basic Metabolic Panel: Recent Labs  Lab 08/22/22 1938 09/05/2022 0117 Sep 05, 2022 0753 2022-09-05 0754 2022/09/05 0830 09-05-2022 0954 2022/09/05 1032  NA 137 139 138 138 140 145 139  K 3.8 5.1 7.3* 7.2* 6.1* 6.1* 6.0*  CL 105 105  --  110  --  112*  --   CO2 24 22  --   --   --  13*  --   GLUCOSE 113* 86  --  255*  --  235*  --   BUN 13 39*  --  49*  --  35*  --   CREATININE 0.84 1.93*  --  1.80*  --  2.14*  --   CALCIUM 9.6 8.8*  --   --   --  10.0  --   MG  --   --   --   --   --  2.8*  --    Liver Function Tests: Recent Labs  Lab 08/22/22 1938 09/05/22 0117 September 05, 2022 0954  AST 16 27 923*  ALT 13 16 1,069*  ALKPHOS 107 90 96  BILITOT 0.7 1.1 0.6  PROT 8.4* 6.9 <3.0*  ALBUMIN 4.2 3.3* <1.5*   Recent Labs  Lab 08/22/22 1938  LIPASE 33   No results for input(s): "AMMONIA" in the last 168 hours. CBC: Recent Labs  Lab 08/22/22 1938 05-Sep-2022 0117 September 05, 2022 0527 09-05-2022 0610 2022-09-05 0753 2022-09-05 0754 09/05/2022 0830 2022-09-05 0954 05-Sep-2022 1032  WBC 12.4* 18.9* 9.8  --   --   --   --  13.5*  --   NEUTROABS  --  16.6* 7.8*  --   --   --   --  11.1*  --   HGB 14.1 10.9* 13.5  --  12.2* 12.6* 8.8* 11.7* 10.9*  HCT 43.2 34.3* 41.7  --  36.0* 37.0* 26.0* 36.5* 32.0*  MCV 100.5* 103.9* 89.5  --   --   --   --  92.9  --   PLT  473* 371 155 160  --   --   --  107*  184  --    Cardiac Enzymes: No results for input(s): "CKTOTAL", "CKMB", "CKMBINDEX", "TROPONINI" in the last 168 hours. Sepsis Labs: Recent Labs  Lab 08/22/22 1938 September 05, 2022 0117 2022-09-05 0207 2022-09-05 0527 September 05, 2022 0954  WBC 12.4* 18.9*  --  9.8 13.5*  LATICACIDVEN  --   --  2.5*  --  >9.0*    Procedures/Operations     [name] 09-05-2022, 5:26 PM

## 2022-08-28 NOTE — Progress Notes (Signed)
AD called to report case is almost done, patient will go to PACU/ ICU

## 2022-08-28 NOTE — Procedures (Signed)
Central Venous Catheter Insertion Procedure Note  [name]  [id number]  10-23-1961  Date:09/04/2022  Time:6:30 AM   Provider Performing:Brighten Buzzelli A Jeremi Losito   Procedure: Insertion of Non-tunneled Central Venous Catheter(36556) with US guidance (65784)   Indication(s) Medication administration and Difficult access  Consent Unable to obtain consent due to emergent nature of procedure.  Anesthesia Topical only with 1% lidocaine   Timeout Verified patient identification, verified procedure, site/side was marked, verified correct patient position, special equipment/implants available, medications/allergies/relevant history reviewed, required imaging and test results available.  Sterile Technique Maximal sterile technique including full sterile barrier drape, hand hygiene, sterile gown, sterile gloves, mask, hair covering, sterile ultrasound probe cover (if used).  Procedure Description Area of catheter insertion was cleaned with chlorhexidine and draped in sterile fashion.  With real-time ultrasound guidance a central venous catheter was placed into the right internal jugular vein. Nonpulsatile blood flow and easy flushing noted in all ports.  The catheter was sutured in place and sterile dressing applied.  Complications/Tolerance None; patient tolerated the procedure well. Chest X-ray is ordered to verify placement for internal jugular or subclavian cannulation.   Chest x-ray is not ordered for femoral cannulation.  EBL Minimal  Specimen(s) None  Rufina Falco, DNP, CCRN, FNP-C, AGACNP-BC Acute Care & Family Nurse Practitioner  [facility]  See Amion for personal pager PCCM on call pager [phone] until 7 am

## 2022-08-28 NOTE — Progress Notes (Signed)
Pharmacy Antibiotic Note  [name] is a 61 y.o. male admitted on 09/23/2022 with GI bleed.  Pharmacy has been consulted for Vancomycin dosing.  Patient experiencing AKI with Scr 2.14. Expect to worsen given significant blood requirements and high vasopressor doses.  Goals of care discussion with family today. Likely comfort care soon.  Plan: Vancomycin 1500mg  IV once No maintenance dose given AKI F/u GOC and renal function for further vancomycin dosing  Continue ceftriaxone 2g IV Q24H Continue metronidazole 500mg  IV Q12H    Temp (24hrs), Avg:98 F (36.7 C), Min:98 F (36.7 C), Max:98 F (36.7 C)  Recent Labs  Lab 08/22/22 1938 2022-09-23 0117 September 23, 2022 0207 September 23, 2022 0527 2022-09-23 0754 09/23/2022 0954  WBC 12.4* 18.9*  --  9.8  --  13.5*  CREATININE 0.84 1.93*  --   --  1.80*  --   LATICACIDVEN  --   --  2.5*  --   --   --     Estimated Creatinine Clearance: 42.4 mL/min (A) (by C-G formula based on SCr of 1.8 mg/dL (H)).    Allergies  Allergen Reactions   Ambien [Zolpidem] Other (See Comments)    Pt took too much, acted out a bad dream/was throwing things, etc--ED visit (07/2017)    Thank you for allowing pharmacy to be a part of this patient's care.  Merrilee Jansky, PharmD Clinical Pharmacist 23-Sep-2022 10:38 AM

## 2022-08-28 NOTE — Progress Notes (Signed)
   21-Sep-2022 1500  Clinical Encounter Type  Visited With Patient and family together  Visit Type Initial;Spiritual support  Referral From Nurse  Consult/Referral To Chaplain   Chaplain responded to a call from the patent's nurse from support of the family.  The daughter of the patient, [name], said that he was [place] and would have like to have prayer said for him in these last hours. I spoke with the patient's daughter, [name] and her daughter [name].  [name] was very close to her grandfather and misses him already. She was able to spend time with him Thursday and will remember that always. We prayed for [name] and also for the comfort of the family in their time of sorrow.   I followed up with the family at 30. [name] died,, and family was supporting one another appropriately in there grief.   [name] [facility]  [phone]

## 2022-08-28 NOTE — ED Triage Notes (Signed)
Pt arrived via [facility] for IR, pt given over 40 blood product en route, currently bleeeding out. Arrives on vent. Levophed, FFP and PRBC;s currently infusing.

## 2022-08-28 NOTE — H&P (Signed)
NAME:  [name], MRN:  [id number], DOB:  11-30-1960, LOS: 0 ADMISSION DATE:  2022/08/26, CONSULTATION DATE:  08-26-22 REFERRING MD:  [facility] PCCM, CHIEF COMPLAINT:  GIB   History of Present Illness:  Patient is encephalopathic and/or intubated. Therefore history has been obtained from chart review.    [name] is 61 y.o. M Who presented to the [facility] emergency department on 10/29 with complaints of coffee-ground emesis   They have a pertinent past medical history of hypertension, stroke with left-sided deficits, SDH, COPD, EtOH abuse, polysubstance abuse   While in the [facility] ED he developed five episodes of melena.  After bowel movement he developed dizziness, became apneic and pulseless.  In hospital CPR initiated-PEA.  ROSC was achieved after 16minutes.  He was intubated. MTP was initiated.  At time of transfer patient received 22 units of PRBCs, 1 of cryo, 1 of FFP, 1 of platelets.  CT angio chest abdomen pelvis concerning for area suspicious for guarding duodenal ulcer.   Dr. Jacelyn Grip spoke with Dr. Verl Blalock of GI and Dr. Christian Mate of general surgery-recommendations were made for IR intervention.  PCCM was consulted for transfer for IR intervention and admission.    Pertinent  Medical History  hypertension, stroke with left-sided deficits, SDH, COPD, EtOH abuse, polysubstance abuse   Significant Hospital Events: Including procedures, antibiotic start and stop dates in addition to other pertinent events   10/29 Presented to [facility], PEA arrest, CVC>, Cordis>, PCCM consult   Interim History / Subjective:  See above Unable to obtain subjective evaluation due to patient status  Reportedly arrived > got total 50 units of blood products prior to [facility], unclear specific amounts   From IR, s/p embolization of GDA - received 4u PRBC in IR - severe acidosis s/p bicarb pushes and gtt - epi gtt added to NE - CRNA states patient was swallowing on ETT but no other movements noted.  Received rocuronium  1.5 hr ago    Objective   Blood pressure (!) 130/108, pulse (!) 46, resp. rate 19, SpO2 100 %.    Vent Mode: PRVC FiO2 (%):  [100 %] 100 % Set Rate:  [20 bmp] 20 bmp Vt Set:  [500 mL-570 mL] 520 mL PEEP:  [5 cmH20] 5 cmH20 Plateau Pressure:  [26 cmH20] 26 cmH20  No intake or output data in the 24 hours ending 26-Aug-2022 0947 There were no vitals filed for this visit.  Examination: General:  critically ill elderly male lying in bed  HEENT: MM > bright/ darker blood pooling, ETT 7.5 at 27, pupils fixed/ 5, absent corneal's Neuro:  unresponsive, no gag, cough CV: rr, NSR/ ST, no murmur, weak radial, no palpable dp PULM:  MV supported breaths, bilaterally coarse, scant brownish secretions, no wheeze GI:  firm/ distended, no BS appreciate, no foley Extremities: cool/ dry, mottled extremities, R tibial IO R IJ CVL R femoral cordis with pigtail> will pull back and flush well  L radial aline  Labs/imaging- from [facility] Creatinine 1.93 (baseline 0.8-0.9), BUN 39 Albumin 3.3 WBC 18.9 Blood culture in process Lactic acid 2.5 Chest x-ray 1:20 AM: No pneumothorax, no infiltrate, no pleural effusion. Twelve-lead: Sinus rhythm, no significant ST changes noted   CTA chest abdomen pelvis 1. No evidence of thoracoabdominal aortic aneurysm or dissection. 2. Mild coronary artery calcification. 3. Large volume high attenuation material within the gastric lumen which may represent blood product given the history of hematemesis. 4. Inflammatory changes within the pancreatico duodenal groove with a small  defect within the posterior wall the duodenal bulb suspicious for an eroding duodenal ulcer. Inflammatory fluid extends posteriorly to encompass the gastroduodenal artery which demonstrates irregular spasm in its mid segment. No free intraperitoneal gas identified. 5. 14 mm indeterminate hyperdense lesion within the interpolar region of the right kidney possibly representing a hyperdense  renal cyst or solid renal mass. This could be better assessed with dedicated renal mass protocol CT or MRI examination. 6. Emphysema.   Resolved Hospital Problem list    Assessment & Plan:  Hemorrhagic shock secondary to GI bleed, high risk for sepsis component In hospital cardiac arrest-PEA, secondary to hemorrhagic shock Patient presented with coffee-ground emesis, episodes of melena while inpatient.  Witnessed cardiac arrest while in emergency department. 44 minutes total ROSC.  MTP activated.  CTA suspicious for eroding duodenal ulcer.   - s/p IR with embolization to GDA - consult CCS for suspicion of duodenal perforation > pending KUB, no free air seen on initial CT> spoke with CCS PA, will review imaging - cont wean NE and EPI for MAP goal > 65 - stat CBC, CMET, lactic, teg  - serial H/H  - transfuse for Hgb <7 - OGT to LIWS - type and screen here stat - start empiric vanc, ceftriaxone and flagyl  - Follow up blood cultures from [facility] - cont PPI BID - warming measures  - NPO  Acute metabolic encephalopathy, also concerning for anoxic injury HX stroke with left-sided deficits - correct metabolic derangements - warming measures, avoid fever - serial neuro exams - consider MRI if no neurological improvements after 72hrs, prognosis guarded   Acute respiratory failure secondary to in hospital cardiac arrest COPD -LTVV strategy with tidal volumes of 4-8 cc/kg ideal body weight -Goal plateau pressures less than 30 and driving pressures less than 15 -Wean PEEP/FiO2 for SpO2 92-98% -VAP bundle -PAD bundle with prn fentanyl,  RASS goal 0/-1 or for vent synchrony  -RASS goal 0 to -1 - CXR and ABG now> ABG 6.89/ 59/ 171/ 11> will increase rate to 30.  Retract ETT 1cm - prn ABG/ CXR   AKI Severe metabolic acidosis  Secondary to shock. Creatinine 1.93 (baseline 0.8-0.9), BUN 39. 14 mm indeterminate hyperdense lesion within the interpolar seen on CT scan. region of the right  kidney -Ensure renal perfusion. Goal MAP 65 or greater. -additional bicarb 3 amps now, cont bicarb gtt - insert foley, check UA - Avoid neprotoxic drugs as possible. - Strict I&O's   Hx CVA with left-sided deficits - noted, serial neuro checks   History hypertension -Hold home antihypertensives at this time in the setting of shock   HX ETOH abuse, polysubstance abuse - Monitor for s/s of ETOH withdrawal - thiamine/ folic acid   Best Practice (right click and "Reselect all SmartList Selections" daily)   Diet/type: NPO DVT prophylaxis: SCD GI prophylaxis: PPI Lines: Central line Foley:  N/A Code Status:  full code Last date of multidisciplinary goals of care discussion [pending] - Dr. Tacy Learn to speak with family   Labs   CBC: Recent Labs  Lab 08/22/22 1938 2022/08/30 0117 2022/08/30 0527 2022-08-30 0610 2022/08/30 0753 2022-08-30 0754  WBC 12.4* 18.9* 9.8  --   --   --   NEUTROABS  --  16.6* 7.8*  --   --   --   HGB 14.1 10.9* 13.5  --  12.2* 12.6*  HCT 43.2 34.3* 41.7  --  36.0* 37.0*  MCV 100.5* 103.9* 89.5  --   --   --  PLT 473* 371 155 160  --   --     Basic Metabolic Panel: Recent Labs  Lab 08/22/22 1938 09/16/22 0117 Sep 16, 2022 0753 09-16-2022 0754  NA 137 139 138 138  K 3.8 5.1 7.3* 7.2*  CL 105 105  --  110  CO2 24 22  --   --   GLUCOSE 113* 86  --  255*  BUN 13 39*  --  49*  CREATININE 0.84 1.93*  --  1.80*  CALCIUM 9.6 8.8*  --   --    GFR: Estimated Creatinine Clearance: 42.4 mL/min (A) (by C-G formula based on SCr of 1.8 mg/dL (H)). Recent Labs  Lab 08/22/22 1938 September 16, 2022 0117 September 16, 2022 0207 16-Sep-2022 0527  WBC 12.4* 18.9*  --  9.8  LATICACIDVEN  --   --  2.5*  --     Liver Function Tests: Recent Labs  Lab 08/22/22 1938 16-Sep-2022 0117  AST 16 27  ALT 13 16  ALKPHOS 107 90  BILITOT 0.7 1.1  PROT 8.4* 6.9  ALBUMIN 4.2 3.3*   Recent Labs  Lab 08/22/22 1938  LIPASE 33   No results for input(s): "AMMONIA" in the last 168 hours.  ABG     Component Value Date/Time   HCO3 10.6 (L) 09-16-22 0753   TCO2 15 (L) 09/16/22 0754   ACIDBASEDEF 23.0 (H) September 16, 2022 0753   O2SAT 45 Sep 16, 2022 0753     Coagulation Profile: Recent Labs  Lab 16-Sep-2022 0117 2022-09-16 0610  INR 1.1 1.7*    Cardiac Enzymes: No results for input(s): "CKTOTAL", "CKMB", "CKMBINDEX", "TROPONINI" in the last 168 hours.  HbA1C: Hgb A1c MFr Bld  Date/Time Value Ref Range Status  11/13/2021 04:06 AM 5.4 4.8 - 5.6 % Final    Comment:    (NOTE) Pre diabetes:          5.7%-6.4%  Diabetes:              >6.4%  Glycemic control for   <7.0% adults with diabetes   11/02/2010 11:50 PM (H) <5.7 % Final   5.8 (NOTE)                                                                       According to the ADA Clinical Practice Recommendations for 2011, when HbA1c is used as a screening test:   >=6.5%   Diagnostic of Diabetes Mellitus           (if abnormal result  is confirmed)  5.7-6.4%   Increased risk of developing Diabetes Mellitus  References:Diagnosis and Classification of Diabetes Mellitus,Diabetes YJEH,6314,97(WYOVZ 1):S62-S69 and Standards of Medical Care in         Diabetes - 2011,Diabetes Care,2011,34  (Suppl 1):S11-S61.    CBG: No results for input(s): "GLUCAP" in the last 168 hours.  Review of Systems:   Unable   Past Medical History:  He,  has a past medical history of Alcoholism ([place]), Anxiety and depression, Arthritis, Chronic joint pain, COPD (chronic obstructive pulmonary disease) ([place]), Depression, Encounter for hemodialysis for acute renal failure ([place]) (04/2020), Frequent headaches, History of kidney stones, Hypertension, Insomnia (08/26/2017), Pneumothorax, right, Polysubstance abuse ([place]), Poor historian, Spastic hemiparesis ([place]), Stroke ([place]) (2012), Stroke [facility]), Subdural hematoma ([place]) (01/29/2017), TIA (transient  ischemic attack) (11/12/2021), and Venous insufficiency of both lower extremities.   Surgical History:   Past  Surgical History:  Procedure Laterality Date   CHEST TUBE INSERTION     LUNG SURGERY     SHOULDER OPEN ROTATOR CUFF REPAIR Bilateral    Multiple rotator cuff surgeries on both sides.   TONSILLECTOMY     TRANSTHORACIC ECHOCARDIOGRAM  09/24/2019   EF 55-60%, grd I DD.   TRANSTHORACIC ECHOCARDIOGRAM  11/12/2021   Normal, no shunt   TYMPANOPLASTY Right    US CAROTIDS  09/24/2019   Minimal atherosclerotic plaque bilat; no flow obstruction   VIDEO ASSISTED THORACOSCOPY (VATS) W/TALC PLEUADESIS  2010 X2     Social History:   reports that he has been smoking cigarettes. He has a 44.00 pack-year smoking history. He has never used smokeless tobacco. He reports current drug use. Frequency: 2.00 times per week. Drug: Marijuana. He reports that he does not drink alcohol.   Family History:  His family history includes Alcohol abuse in his maternal uncle; Arthritis in his mother; Breast cancer in his maternal aunt; Cancer in his brother; Heart disease in his father; Hypertension in his father; Lung cancer in his maternal aunt.   Allergies Allergies  Allergen Reactions   Ambien [Zolpidem] Other (See Comments)    Pt took too much, acted out a bad dream/was throwing things, etc--ED visit (07/2017)     Home Medications  Prior to Admission medications   Medication Sig Start Date End Date Taking? Authorizing Provider  aspirin EC 81 MG EC tablet Take 1 tablet (81 mg total) by mouth daily with breakfast. Swallow whole. 11/14/21   Fritzi Mandes, MD  atorvastatin (LIPITOR) 40 MG tablet TAKE 1 TABLET BY MOUTH EVERY DAY 02/15/22   McGowen, Adrian Blackwater, MD  cyclobenzaprine (FLEXERIL) 10 MG tablet TAKE 1 TABLET BY MOUTH THREE TIMES A DAY AS NEEDED 08/01/22   McGowen, Adrian Blackwater, MD  famotidine (PEPCID) 20 MG tablet Take 1 tablet (20 mg total) by mouth daily. 07/31/22 08/30/22  Lucillie Garfinkel, MD  felodipine (PLENDIL) 10 MG 24 hr tablet TAKE 1 TABLET BY MOUTH EVERY DAY 03/07/22   McGowen, Adrian Blackwater, MD  KLOR-CON M20 20 MEQ  tablet TAKE 1 TABLET BY MOUTH EVERY DAY 07/02/22   McGowen, Adrian Blackwater, MD  lisinopril (ZESTRIL) 40 MG tablet Take 1 tablet (40 mg total) by mouth daily. 07/10/22   McGowen, Adrian Blackwater, MD  meloxicam (MOBIC) 15 MG tablet Take 1 tablet (15 mg total) by mouth daily. 07/10/22 07/10/23  McGowen, Adrian Blackwater, MD  ondansetron (ZOFRAN) 4 MG tablet Take 1 tablet (4 mg total) by mouth daily as needed for nausea or vomiting. 07/31/22 07/31/23  Lucillie Garfinkel, MD  ondansetron (ZOFRAN-ODT) 4 MG disintegrating tablet Take 1 tablet (4 mg total) by mouth every 8 (eight) hours as needed for nausea or vomiting. 08/22/22   Blake Divine, MD  oxyCODONE-acetaminophen (PERCOCET) 5-325 MG tablet Take 1 tablet by mouth every 4 (four) hours as needed for severe pain. 08/22/22 08/22/23  Blake Divine, MD  QUEtiapine (SEROQUEL) 400 MG tablet TAKE 1 TABLET BY MOUTH AT BEDTIME. 03/07/22   McGowen, Adrian Blackwater, MD  traZODone (DESYREL) 100 MG tablet TAKE 1 TABLET BY MOUTH EVERYDAY AT BEDTIME 02/15/22   McGowen, Adrian Blackwater, MD  Vilazodone HCl 20 MG TABS TAKE 1 TABLET BY MOUTH EVERY DAY 08/01/22   McGowen, Adrian Blackwater, MD     Critical care time: 44 mins     Kennieth Rad, MSN, AG-ACNP-BC  [facility] 2022/09/10, 9:47 AM  See Amion for pager If no response to pager, please call PCCM consult pager After 7:00 pm call Elink

## 2022-08-28 NOTE — Progress Notes (Signed)
Arrived to IR suite with ED team, received by anesthesia team intubated with bright red blood covering the entirety of chux pads.  Noted patient to have generalized mottled skin with purple color to legs, unable to doppler pulses below bilateral femoral region. Changed defibrillator patches to [facility] equipment, blood bank contacted by anesthesia team, RT phone [phone] for post procedure transport assistance, patient to go to PACU post procedure and AD is aware.

## 2022-08-28 NOTE — Progress Notes (Signed)
Time out with Dr Dwaine Gale, anesthesia team, IR team

## 2022-08-28 NOTE — Progress Notes (Signed)
Pt extubated at this time

## 2022-08-28 NOTE — Progress Notes (Addendum)
    NAME:  [name], MRN:  [id number], DOB:  [birth year], LOS: 0 ADMISSION DATE:  Aug 27, 2022, CONSULTATION DATE:  2022/08/27 REFERRING MD:  Lucillie Garfinkel, CHIEF COMPLAINT: coffee-ground emesis   BRIEF HPI  61 y.o with significant PMH of COPD, HTN, Chronic pain syndrome, polysubstance abuse, CVA/TIA, subdural hematoma, PVD, Right pneumothorax, who presented to the ED with chief complaints of coffee-ground emesis    ED Course: Initial vital signs as below. Patient had five mores episodes of melena soon afterwards developed dizziness, became apneic and hypotensive and went into cardiac arrest likely secondary to hemorrhagic shock.  CPR/ACLS initiated-PEA.  ROSC was achieved after several minutes.  He was intubated. MTP was initiated.  Patient received 22 units of PRBCs, 1 of cryo, 1 of FFP, 1 of platelets.  CT angio chest abdomen pelvis concerning for area suspicious for guarding duodenal ulcer.   Dr. Jacelyn Grip spoke with Dr. Verl Blalock of GI and Dr. Christian Mate of general surgery-recommendations were made for IR intervention.  PCCM was consulted for admission however due to nature of bleed requiring higher level of care and possible embolization, patient was not accepted to the ICU. Discussed with EDP that patient will likely need to be transferred for further management. EDP placed consult for central line which was emergently placed by PCCM per their request for hemodynamic stabilization prior to transfer. Discussed case with ICU attending Dr. Genia Harold and Director Dr. Mortimer Fries  OBJECTIVE  Blood pressure (!) 114/49, pulse (!) 30, temperature 98 F (36.7 C), temperature source Oral, resp. rate (!) 26, SpO2 96 %.    Vent Mode: AC FiO2 (%):  [100 %] 100 % Set Rate:  [20 bmp] 20 bmp Vt Set:  [500 mL] 500 mL PEEP:  [5 cmH20] 5 cmH20   Intake/Output Summary (Last 24 hours) at 2022/08/27 1638 Last data filed at 08/27/22 0631 Gross per 24 hour  Intake 2260 ml  Output --  Net 2260 ml    Active Hospital Problem  list     Assessment & Plan:   Acute Blood Loss Anemia Hemorrhagic shock  Concerns for  DIC Hematemesis, Hematochezia, Melena, Upper GI Differential: Peptic Ulcer Disease (H. Pylori vs. NSAID), Gastritis, Variceal Bleed, MW Tear, AVM, less likely malignancy Lower GI Differential: Diverticulosis, AVM, malignancy, hemorrhoids - At least 2x IV access, 18 gauge or larger - IVF resuscitation to maintain MAP>65 - H&H monitoring q6h - Transfuse PRN Hgb<7  (Massive Transfusion Protocol initiated  FFP+Platelets since patient needing more than 2-4 units PRBC) - Pantoprazole 80mg  IV x1 then gtt 8mg /hr - NG lavage with 1L cold NS then continue to suction if ongoing N/V - NPO for pending endoscopy - GI Consult for EGD/Colonoscopy - Hold NSAIDs, steroids, ASA - Octreotide 54mcg IV x1 then 16mcg/hr drip - IR Consult for embolization, plan to transfer to [facility] for embolization. Discussed with EDP that admission to [facility] ICU will be deferred pending transfer.   = Goals of Care = Code Status Order: FULL  Primary Emergency Contact: chavis,andre Wishes to pursue full aggressive treatment and intervention options, including CPR and intubation, but goals of care will be addressed on going with family if that should become necessary.  Critical care time: 25 minutes       Rufina Falco, DNP, CCRN, FNP-C, AGACNP-BC Acute Care Nurse Practitioner [facility]  PCCM on call pager [phone] until 7 am

## 2022-08-28 NOTE — Transfer of Care (Signed)
Immediate Anesthesia Transfer of Care Note  Patient: [name]  Procedure(s) Performed: IR WITH ANESTHESIA  Patient Location: ICU  Anesthesia Type:General  Level of Consciousness: drowsy, patient cooperative and Patient remains intubated per anesthesia plan  Airway & Oxygen Therapy: Patient remains intubated per anesthesia plan and Patient placed on Ventilator (see vital sign flow sheet for setting)  Post-op Assessment: Report given to RN and Post -op Vital signs reviewed and stable  Post vital signs: Reviewed and stable  Last Vitals:  Vitals Value Taken Time  BP    Temp    Pulse    Resp 17 09/11/22 0955  SpO2    Vitals shown include unvalidated device data.  Last Pain: There were no vitals filed for this visit.       Complications: No notable events documented.

## 2022-08-28 NOTE — Progress Notes (Signed)
Patient transported to IR from ED on ventilator without complication. RN at bedside.

## 2022-08-28 NOTE — ED Notes (Addendum)
0325 this RN walked into pt's room to assist from commode back to bed. This was pt's 5th episode of dark red bloody diarrhea. Pt was insistently refusing a bed pan and demanded to go to commode. When pt returned to bed, he stated he was very dizzy. This RN assisted pt back into bed and cycled bp. Pt began screaming "I am going to pass out. I am going to pass out. Please help me." This RN placed pt on NRB and went to the doorway and asked another nurse to grab MD immediately. MD at bedside, pt on NRB, pt not answering questions and face was gray. Another RN was gaining [place] access when this pt became apneic and pulseless.  7169 CPR started  0331 pulse check, PEA, cpr continued 0336 no pulse, PEA, cpr continued  0338 pulse check, tachy at 107. Intubated 7.5 25@lip . + breath sounds  0339 pulse check, brady. 1mg  Atropine given. IO placed in R tib  0340 pulse check, asystole, cpr continued 0341 epi given 1 amp, cpr continued 0345 1st unit PRBC in  0346 pulse check, asystole, epi given, cpr continued  0402 no pulse, PEA, hand squeezing blood, cpr continued 0406 no pulse, PEA, CPR continued  0409 first unit PRBC complete  0412 pulse check, ROSC, first unit warm blood being administered  0417 first unit warm blood complete

## 2022-08-28 NOTE — Progress Notes (Signed)
Patient time of death 47 , confirmed with two RN, MD. Tacy Learn notify as well.  family at bedside at time of death. organ donation and morgue also notify.

## 2022-08-28 NOTE — ED Provider Notes (Addendum)
[facility] Provider Note    Event Date/Time   First MD Initiated Contact with Patient 09-22-22 0032     (approximate)   History   Hematemesis (Pt bib ems reports 2 episodes of coffee-ground emesis tonight. Pt endorses he was seen here for same 2 days ago. Initial BP with ems 40'J systolic. EMS gave 4mg  zofran and approx 59ml NS. 91/59 in triage )   HPI  [name] is a 61 y.o. male   Past medical history of prior stroke with left-sided deficits, alcohol use, anxiety depression, COPD, kidney stones, polysubstance use, presents with reported coffee-ground emesis and abdominal pain.    Of note, he had been seen in the emergency department on 07/31/2022 with nausea and vomiting but no reports of bleeding then, had a CT of the abdomen pelvis on that date which showed no acute pathology, noted central biliary ductal dilatation without obstructing lesion and had normal LFTs at that time.  He was discharged with Pepcid.  He was seen on 08/22/2022 abdominal pain and found to have gallstones but no cholecystitis.  He has a history of alcohol use but no endoscopies on my medical chart review and no history of varices noted.  Today he presented to the emergency department with nausea and vomiting, abdominal pain worsening.  He denied fever or chills.  When he initially came to the emergency department he had a small amount of bilious emesis in his emesis basin but no frank blood.  His blood pressure was 90s over 60s and he was alert and oriented and did not appear acutely toxic or in distress.  He denied blood from the rectum, urinary symptoms, fever or chills.  History was obtained via the patient and a review of external medical notes.      Physical Exam   Triage Vital Signs: ED Triage Vitals  Enc Vitals Group     BP 22-Sep-2022 0040 (!) 91/59     Pulse Rate 2022-09-22 0040 91     Resp 09-22-22 0040 20     Temp 09-22-2022 0615 98 F (36.7 C)     Temp Source  September 22, 2022 0635 Oral     SpO2 Sep 22, 2022 0040 97 %     Weight --      Height --      Head Circumference --      Peak Flow --      Pain Score 2022/09/22 0040 10     Pain Loc --      Pain Edu? --      Excl. in [place]? --     Most recent vital signs: Vitals:   2022-09-22 0635 Sep 22, 2022 0640  BP:  (!) 114/49  Pulse:  (!) 30  Resp:  (!) 26  Temp: 98 F (36.7 C)   SpO2:      General: Awake, no distress.  CV:  Good peripheral perfusion.  Resp:  Normal effort.  Abd:  No distention.  Tenderness to palpation right upper quadrant and epigastrium without rigidity or guarding. Other:  Awake, alert, pleasant and nontoxic-appearing.   ED Results / Procedures / Treatments   Labs (all labs ordered are listed, but only abnormal results are displayed) Labs Reviewed  LACTIC ACID, PLASMA - Abnormal; Notable for the following components:      Result Value   Lactic Acid, Venous 2.5 (*)    All other components within normal limits  COMPREHENSIVE METABOLIC PANEL - Abnormal; Notable for the following components:   BUN  39 (*)    Creatinine, Ser 1.93 (*)    Calcium 8.8 (*)    Albumin 3.3 (*)    GFR, Estimated 38 (*)    All other components within normal limits  CBC WITH DIFFERENTIAL/PLATELET - Abnormal; Notable for the following components:   WBC 18.9 (*)    RBC 3.30 (*)    Hemoglobin 10.9 (*)    HCT 34.3 (*)    MCV 103.9 (*)    Neutro Abs 16.6 (*)    Abs Immature Granulocytes 0.12 (*)    All other components within normal limits  DIC (DISSEMINATED INTRAVASCULAR COAGULATION)PANEL - Abnormal; Notable for the following components:   Prothrombin Time 20.0 (*)    INR 1.7 (*)    aPTT 60 (*)    D-Dimer, Quant 19.60 (*)    All other components within normal limits  CBC WITH DIFFERENTIAL/PLATELET - Abnormal; Notable for the following components:   RDW 16.2 (*)    nRBC 0.3 (*)    Neutro Abs 7.8 (*)    Abs Immature Granulocytes 0.32 (*)    All other components within normal limits  CULTURE, BLOOD  (ROUTINE X 2)  CULTURE, BLOOD (ROUTINE X 2)  PROTIME-INR  APTT  LACTIC ACID, PLASMA  HEMOGLOBIN AND HEMATOCRIT, BLOOD  BASIC METABOLIC PANEL  TYPE AND SCREEN  PREPARE FRESH FROZEN PLASMA  PREPARE PLATELET PHERESIS  PREPARE RBC (CROSSMATCH)  PREPARE CRYOPRECIPITATE  MASSIVE TRANSFUSION PROTOCOL ORDER (BLOOD BANK NOTIFICATION)  ABO/RH     I reviewed labs and they are notable for elevated white blood cell count 18.9  EKG  ED ECG REPORT I, Lucillie Garfinkel, the attending physician, personally viewed and interpreted this ECG.   Date: 2022-08-27  EKG Time: 0050  Rate: 93  Rhythm: normal EKG, normal sinus rhythm  Axis: nl  Intervals:none  ST&T Change: no ischemic changes    RADIOLOGY I independently reviewed and interpreted this x-ray and see no obvious focalities or pneumothorax   PROCEDURES:  Critical Care performed: Yes, see critical care procedure note(s)  .Critical Care  Performed by: Lucillie Garfinkel, MD Authorized by: Lucillie Garfinkel, MD   Critical care provider statement:    Critical care time (minutes):  180   Critical care start time:  27-Aug-2022 3:30 AM   Critical care end time:  Aug 27, 2022 6:30 AM   Critical care was necessary to treat or prevent imminent or life-threatening deterioration of the following conditions:  Respiratory failure, circulatory failure and shock   Critical care was time spent personally by me on the following activities:  Blood draw for specimens, development of treatment plan with patient or surrogate, discussions with consultants, evaluation of patient's response to treatment, examination of patient, obtaining history from patient or surrogate, re-evaluation of patient's condition, pulse oximetry, ordering and review of radiographic studies, ordering and review of laboratory studies and ordering and performing treatments and interventions    MEDICATIONS ORDERED IN ED: Medications  ondansetron (ZOFRAN) injection 4 mg (4 mg Intravenous Given  August 27, 2022 0124)  sodium chloride 0.9 % bolus 2,000 mL (0 mLs Intravenous Stopped 08/27/22 0631)  ceFEPIme (MAXIPIME) 2 g in sodium chloride 0.9 % 100 mL IVPB (0 g Intravenous Stopped August 27, 2022 0310)  iohexol (OMNIPAQUE) 350 MG/ML injection 75 mL (75 mLs Intravenous Contrast Given August 27, 2022 0225)  fentaNYL (SUBLIMAZE) injection 50 mcg (50 mcg Intravenous Given 08/27/22 0238)  acetaminophen (TYLENOL) tablet 1,000 mg (1,000 mg Oral Given 08/27/2022 0238)  calcium chloride 10 % injection (  Given 08/27/2022 0610)  calcium chloride  10 % injection (  Given 08-26-22 0532)  magnesium sulfate IVPB 2 g 50 mL (0 g Intravenous Stopped 2022-08-26 0631)  calcium chloride 1 g in sodium chloride 0.9 % 100 mL IVPB (0 g Intravenous Stopped 26-Aug-2022 0534)    Consultants:  I spoke with gastroenterology, interventional radiology, ICU regarding care plan for this patient.   IMPRESSION / MDM / ASSESSMENT AND PLAN / ED COURSE  I reviewed the triage vital signs and the nursing notes.                              Differential diagnosis includes, but is not limited to, abdominal infection, GI bleeding, urinary tract infection, vascular   The patient is on the cardiac monitor to evaluate for evidence of arrhythmia and/or significant heart rate changes.  MDM: This patient with nausea and vomiting and known gallstones scheduled for outpatient follow-up for surgical options including cholecystitis with worsening upper abdominal pain nausea and vomiting who presented to the emergency department initially in stable condition with no frank signs of hematemesis, was initially worked up for sepsis and given sepsis bolus along with sepsis work-up including CT scan and broad spectrum antibiotics. I elected to perform a CT angiogram of the chest abdomen pelvis given the patient's reports of blood in the emesis.   Hgb was 10.9 today from 14 (10/26) from 11.9 (10/4) and maintaining MAPs in 70+ during his workup.   He had no reports  initially of rectal bleeding but subsequently in the emergency department had several episodes of maroon stool from the rectum around the time of his CT angiogram, which resulted and showed a duodenal ulcer.  After CT, he became pale, and decompensated, and started on emergent blood, protonix, and Levophed to maintain his blood pressure in anticipation for intubation but soon rapidly became bradycardic lost pulses and immediately high-quality CPR was initiated and the patient was intubated by my colleague.  Blood products continued to be given.  Was in PEA arrest.  ACLS was performed, several rounds of epinephrine given.  Regained pulses briefly and was bradycardic and so 1 mg of atropine was given.  Unfortunately he became pulseless once again and was found to be in asystole so ACLS was resumed.  Blood products ongoing massive transfusion protocol was initiated.  Octreotide was ordered.  Able to regain pulses, see associated notes for details of ACLS and all products given.  Significant amount of blood products given to this patient via access obtained during this time including a Cordis to the right femoral and a triple-lumen catheter placed to the right IJ along with an interosseous in the right tibia and IV access in the right antecubital fossa  We reviewed his PT/INR and anticoagulation status and there was nothing to reverse.  He was given 1 g of TXA.  He was given calcium and magnesium along with his blood products.  He had blood from OG tube and later blood from nares and mouth; concern for DIC   We called our consultants for definitive management of his upper GI bleed which appeared to be duodenal origin on imaging.  My colleague spoke to vascular Dr. Bridgett Larsson, I spoke with interventional radiologist Dr. Dwaine Gale, gastroenterologist Dr. Allen Norris and general surgeon Dr. Christian Mate and ultimately the service that was deemed most appropriate for intervention was interventional radiology.   In  consultation with interventional radiologist Dr. Dwaine Gale and anesthesiology Dr. Magdalene Patricia it was advised that the patient  be transferred from [facility] for interventional radiology procedure given resource limitations at [facility].  Transfer was initiated and patient continued to receive blood products and did maintain pulses while awaiting transport.  Family was informed.    Patient's presentation is most consistent with acute presentation with potential threat to life or bodily function.       FINAL CLINICAL IMPRESSION(S) / ED DIAGNOSES   Final diagnoses:  Upper GI bleed  Cardiac arrest ([place])     Rx / DC Orders   ED Discharge Orders     None        Note:  This document was prepared using Dragon voice recognition software and may include unintentional dictation errors.    Lucillie Garfinkel, MD 08-30-2022 [id number]    Lucillie Garfinkel, MD 08-30-2022 [id number]    Lucillie Garfinkel, MD 2022/08/30 [place], [place], MD Aug 30, 2022 [phone]

## 2022-08-28 NOTE — Progress Notes (Signed)
Initial coil deployed

## 2022-08-28 DEATH — deceased

## 2022-08-30 LAB — CULTURE, BLOOD (ROUTINE X 2): Culture: NO GROWTH

## 2022-08-31 ENCOUNTER — Other Ambulatory Visit: Payer: Self-pay | Admitting: Family Medicine

## 2022-10-23 NOTE — Progress Notes (Signed)
This encounter was created in error - please disregard.

## 2023-01-08 ENCOUNTER — Ambulatory Visit: Payer: Medicare Other | Admitting: Family Medicine
